# Patient Record
Sex: Female | Born: 1945
Health system: Southern US, Community
[De-identification: ages and names within clinical notes are randomized; demographics above are authoritative.]

## PROBLEM LIST (undated history)

## (undated) DIAGNOSIS — Z860100 Personal history of colon polyps, unspecified: Secondary | ICD-10-CM

## (undated) DIAGNOSIS — I10 Essential (primary) hypertension: Secondary | ICD-10-CM

## (undated) DIAGNOSIS — C50919 Malignant neoplasm of unspecified site of unspecified female breast: Secondary | ICD-10-CM

## (undated) DIAGNOSIS — J439 Emphysema, unspecified: Secondary | ICD-10-CM

## (undated) DIAGNOSIS — R0600 Dyspnea, unspecified: Secondary | ICD-10-CM

## (undated) DIAGNOSIS — H3530 Unspecified macular degeneration: Secondary | ICD-10-CM

## (undated) DIAGNOSIS — E669 Obesity, unspecified: Secondary | ICD-10-CM

## (undated) DIAGNOSIS — Z9221 Personal history of antineoplastic chemotherapy: Secondary | ICD-10-CM

## (undated) DIAGNOSIS — K589 Irritable bowel syndrome without diarrhea: Secondary | ICD-10-CM

## (undated) DIAGNOSIS — J42 Unspecified chronic bronchitis: Secondary | ICD-10-CM

## (undated) DIAGNOSIS — K5790 Diverticulosis of intestine, part unspecified, without perforation or abscess without bleeding: Secondary | ICD-10-CM

## (undated) DIAGNOSIS — I739 Peripheral vascular disease, unspecified: Secondary | ICD-10-CM

## (undated) DIAGNOSIS — C801 Malignant (primary) neoplasm, unspecified: Secondary | ICD-10-CM

## (undated) DIAGNOSIS — K219 Gastro-esophageal reflux disease without esophagitis: Secondary | ICD-10-CM

## (undated) DIAGNOSIS — F329 Major depressive disorder, single episode, unspecified: Secondary | ICD-10-CM

## (undated) DIAGNOSIS — R0789 Other chest pain: Secondary | ICD-10-CM

## (undated) DIAGNOSIS — J309 Allergic rhinitis, unspecified: Secondary | ICD-10-CM

## (undated) DIAGNOSIS — I2510 Atherosclerotic heart disease of native coronary artery without angina pectoris: Secondary | ICD-10-CM

## (undated) DIAGNOSIS — Z923 Personal history of irradiation: Secondary | ICD-10-CM

## (undated) HISTORY — DX: Unspecified macular degeneration: H35.30

## (undated) HISTORY — PX: TUBAL LIGATION: SHX77

## (undated) HISTORY — PX: VAGINAL HYSTERECTOMY: SUR661

## (undated) HISTORY — DX: Other chest pain: R07.89

## (undated) HISTORY — DX: Irritable bowel syndrome, unspecified: K58.9

## (undated) HISTORY — DX: Obesity, unspecified: E66.9

## (undated) HISTORY — DX: Essential (primary) hypertension: I10

## (undated) HISTORY — DX: Personal history of colonic polyps: Z86.010

## (undated) HISTORY — DX: Gastro-esophageal reflux disease without esophagitis: K21.9

## (undated) HISTORY — DX: Allergic rhinitis, unspecified: J30.9

## (undated) HISTORY — DX: Personal history of colon polyps, unspecified: Z86.0100

## (undated) HISTORY — PX: VEIN LIGATION AND STRIPPING: SHX2653

## (undated) HISTORY — DX: Dyspnea, unspecified: R06.00

## (undated) HISTORY — PX: CATARACT EXTRACTION: SUR2

## (undated) HISTORY — DX: Major depressive disorder, single episode, unspecified: F32.9

## (undated) HISTORY — PX: BREAST LUMPECTOMY: SHX2

## (undated) HISTORY — PX: CHOLECYSTECTOMY: SHX55

## (undated) HISTORY — DX: Unspecified chronic bronchitis: J42

## (undated) HISTORY — DX: Peripheral vascular disease, unspecified: I73.9

## (undated) HISTORY — PX: BREAST BIOPSY: SHX20

## (undated) HISTORY — DX: Diverticulosis of intestine, part unspecified, without perforation or abscess without bleeding: K57.90

## (undated) HISTORY — DX: Atherosclerotic heart disease of native coronary artery without angina pectoris: I25.10

## (undated) HISTORY — DX: Emphysema, unspecified: J43.9

---

## 2003-06-13 ENCOUNTER — Encounter (INDEPENDENT_AMBULATORY_CARE_PROVIDER_SITE_OTHER): Payer: Self-pay | Admitting: *Deleted

## 2003-06-13 ENCOUNTER — Encounter: Admission: RE | Admit: 2003-06-13 | Discharge: 2003-06-13 | Payer: Self-pay | Admitting: *Deleted

## 2003-06-15 ENCOUNTER — Encounter (INDEPENDENT_AMBULATORY_CARE_PROVIDER_SITE_OTHER): Payer: Self-pay | Admitting: *Deleted

## 2003-06-15 ENCOUNTER — Encounter: Admission: RE | Admit: 2003-06-15 | Discharge: 2003-06-15 | Payer: Self-pay | Admitting: *Deleted

## 2003-06-25 ENCOUNTER — Encounter (HOSPITAL_COMMUNITY): Admission: RE | Admit: 2003-06-25 | Discharge: 2003-09-23 | Payer: Self-pay | Admitting: *Deleted

## 2003-07-02 ENCOUNTER — Ambulatory Visit (HOSPITAL_COMMUNITY): Admission: RE | Admit: 2003-07-02 | Discharge: 2003-07-02 | Payer: Self-pay | Admitting: *Deleted

## 2003-07-02 ENCOUNTER — Encounter: Admission: RE | Admit: 2003-07-02 | Discharge: 2003-07-02 | Payer: Self-pay | Admitting: *Deleted

## 2003-07-02 ENCOUNTER — Encounter (INDEPENDENT_AMBULATORY_CARE_PROVIDER_SITE_OTHER): Payer: Self-pay | Admitting: *Deleted

## 2003-07-02 ENCOUNTER — Ambulatory Visit (HOSPITAL_BASED_OUTPATIENT_CLINIC_OR_DEPARTMENT_OTHER): Admission: RE | Admit: 2003-07-02 | Discharge: 2003-07-02 | Payer: Self-pay | Admitting: *Deleted

## 2003-07-03 ENCOUNTER — Encounter (INDEPENDENT_AMBULATORY_CARE_PROVIDER_SITE_OTHER): Payer: Self-pay | Admitting: *Deleted

## 2003-07-16 ENCOUNTER — Ambulatory Visit (HOSPITAL_COMMUNITY): Admission: RE | Admit: 2003-07-16 | Discharge: 2003-07-16 | Payer: Self-pay | Admitting: *Deleted

## 2003-07-16 ENCOUNTER — Encounter (INDEPENDENT_AMBULATORY_CARE_PROVIDER_SITE_OTHER): Payer: Self-pay | Admitting: *Deleted

## 2003-07-16 ENCOUNTER — Ambulatory Visit (HOSPITAL_BASED_OUTPATIENT_CLINIC_OR_DEPARTMENT_OTHER): Admission: RE | Admit: 2003-07-16 | Discharge: 2003-07-16 | Payer: Self-pay | Admitting: *Deleted

## 2003-07-17 ENCOUNTER — Ambulatory Visit: Admission: RE | Admit: 2003-07-17 | Discharge: 2003-08-13 | Payer: Self-pay | Admitting: Radiation Oncology

## 2003-08-07 ENCOUNTER — Ambulatory Visit (HOSPITAL_BASED_OUTPATIENT_CLINIC_OR_DEPARTMENT_OTHER): Admission: RE | Admit: 2003-08-07 | Discharge: 2003-08-07 | Payer: Self-pay | Admitting: *Deleted

## 2003-11-02 ENCOUNTER — Ambulatory Visit: Admission: RE | Admit: 2003-11-02 | Discharge: 2004-01-21 | Payer: Self-pay | Admitting: Radiation Oncology

## 2003-11-07 ENCOUNTER — Encounter: Admission: RE | Admit: 2003-11-07 | Discharge: 2003-11-07 | Payer: Self-pay | Admitting: Radiation Oncology

## 2004-01-09 ENCOUNTER — Ambulatory Visit: Payer: Self-pay | Admitting: Oncology

## 2004-02-12 ENCOUNTER — Ambulatory Visit: Admission: RE | Admit: 2004-02-12 | Discharge: 2004-02-12 | Payer: Self-pay | Admitting: Radiation Oncology

## 2004-04-03 ENCOUNTER — Ambulatory Visit: Payer: Self-pay | Admitting: Oncology

## 2004-06-12 ENCOUNTER — Encounter: Admission: RE | Admit: 2004-06-12 | Discharge: 2004-06-12 | Payer: Self-pay | Admitting: Oncology

## 2004-06-19 ENCOUNTER — Ambulatory Visit: Payer: Self-pay | Admitting: Oncology

## 2004-07-23 ENCOUNTER — Ambulatory Visit: Admission: RE | Admit: 2004-07-23 | Discharge: 2004-07-23 | Payer: Self-pay | Admitting: Radiation Oncology

## 2004-08-06 ENCOUNTER — Ambulatory Visit (HOSPITAL_BASED_OUTPATIENT_CLINIC_OR_DEPARTMENT_OTHER): Admission: RE | Admit: 2004-08-06 | Discharge: 2004-08-06 | Payer: Self-pay | Admitting: *Deleted

## 2004-10-16 ENCOUNTER — Ambulatory Visit: Payer: Self-pay | Admitting: Oncology

## 2005-02-18 ENCOUNTER — Ambulatory Visit: Payer: Self-pay | Admitting: Oncology

## 2005-06-15 ENCOUNTER — Encounter: Admission: RE | Admit: 2005-06-15 | Discharge: 2005-06-15 | Payer: Self-pay | Admitting: Oncology

## 2005-06-15 ENCOUNTER — Encounter (INDEPENDENT_AMBULATORY_CARE_PROVIDER_SITE_OTHER): Payer: Self-pay | Admitting: *Deleted

## 2005-06-25 ENCOUNTER — Ambulatory Visit: Payer: Self-pay | Admitting: Oncology

## 2005-10-15 ENCOUNTER — Ambulatory Visit: Payer: Self-pay | Admitting: Oncology

## 2006-02-04 ENCOUNTER — Ambulatory Visit: Payer: Self-pay | Admitting: Oncology

## 2006-06-17 ENCOUNTER — Encounter: Admission: RE | Admit: 2006-06-17 | Discharge: 2006-06-17 | Payer: Self-pay | Admitting: Oncology

## 2006-06-17 ENCOUNTER — Encounter (INDEPENDENT_AMBULATORY_CARE_PROVIDER_SITE_OTHER): Payer: Self-pay | Admitting: Specialist

## 2006-06-23 ENCOUNTER — Ambulatory Visit: Payer: Self-pay | Admitting: Oncology

## 2006-12-03 ENCOUNTER — Ambulatory Visit: Payer: Self-pay | Admitting: Oncology

## 2006-12-17 ENCOUNTER — Encounter: Admission: RE | Admit: 2006-12-17 | Discharge: 2006-12-17 | Payer: Self-pay | Admitting: Oncology

## 2007-02-01 ENCOUNTER — Ambulatory Visit (HOSPITAL_COMMUNITY): Admission: RE | Admit: 2007-02-01 | Discharge: 2007-02-01 | Payer: Self-pay | Admitting: *Deleted

## 2007-02-01 ENCOUNTER — Encounter (INDEPENDENT_AMBULATORY_CARE_PROVIDER_SITE_OTHER): Payer: Self-pay | Admitting: *Deleted

## 2007-06-27 ENCOUNTER — Encounter: Admission: RE | Admit: 2007-06-27 | Discharge: 2007-06-27 | Payer: Self-pay | Admitting: Oncology

## 2008-07-03 ENCOUNTER — Encounter: Admission: RE | Admit: 2008-07-03 | Discharge: 2008-07-03 | Payer: Self-pay | Admitting: Oncology

## 2009-07-09 ENCOUNTER — Encounter: Admission: RE | Admit: 2009-07-09 | Discharge: 2009-07-09 | Payer: Self-pay | Admitting: Oncology

## 2010-03-30 ENCOUNTER — Encounter: Payer: Self-pay | Admitting: Oncology

## 2010-06-17 ENCOUNTER — Other Ambulatory Visit: Payer: Self-pay | Admitting: Family Medicine

## 2010-06-17 DIAGNOSIS — Z9889 Other specified postprocedural states: Secondary | ICD-10-CM

## 2010-07-15 ENCOUNTER — Ambulatory Visit
Admission: RE | Admit: 2010-07-15 | Discharge: 2010-07-15 | Disposition: A | Discharge status: Home / Self Care | Payer: BC Managed Care – PPO | Source: Ambulatory Visit | Attending: Family Medicine | Admitting: Family Medicine

## 2010-07-15 DIAGNOSIS — Z9889 Other specified postprocedural states: Secondary | ICD-10-CM

## 2010-07-22 NOTE — Op Note (Signed)
NAME:  Heather Chavez, Heather Chavez             ACCOUNT NO.:  [id number]   MEDICAL RECORD NO.:  [id number]          PATIENT TYPE:  AMB   LOCATION:  DAY                          FACILITY:  [facility]   PHYSICIAN:  Alfonse Ras, MD   DATE OF BIRTH:  09-28-1945   DATE OF PROCEDURE:  DATE OF DISCHARGE:                               OPERATIVE REPORT   PREOPERATIVE DIAGNOSIS:  History of right-sided breast cancer with right-  sided breast mass.   PROCEDURE:  Excisional right breast biopsy.   SURGEON:  Alfonse Ras, MD.   ANESTHESIA:  Local MAC.   DESCRIPTION OF PROCEDURE:  The patient was taken to the operating room  and placed in a supine position.  After adequate MAC anesthesia was  induced, the right breast was prepped and draped in normal sterile  fashion.  Using 1% lidocaine local anesthesia, the skin around the mass  and the upper outer quadrant was anesthetized.  Deep tissues were also  anesthetized.  Makin the elliptical incision around the mass, I  dissected down onto fatty tissue.  There was some scarring, but was most  consistent grossly with fat necrosis.  The specimen was sent to  pathology.  Adequate hemostasis was ensured.  The skin was closed with  subcuticular 4-0 Monocryl.  Steri-Strips and sterile dressings were  applied.  The patient tolerated the procedure well and went to PACU in  good condition.      Alfonse Ras, MD  Electronically Signed     KRE/MEDQ  D:  02/01/2007  T:  02/02/2007  Job:  [id number]

## 2010-07-25 NOTE — Op Note (Signed)
NAME:  Heather Chavez, Heather Chavez                       ACCOUNT NO.:  [id number]   MEDICAL RECORD NO.:  [id number]                   PATIENT TYPE:  AMB   LOCATION:  DSC                                  FACILITY:  [facility]   PHYSICIAN:  Vikki Ports, M.D.         DATE OF BIRTH:  1945/07/17   DATE OF PROCEDURE:  07/16/2003  DATE OF DISCHARGE:                                 OPERATIVE REPORT   PREOPERATIVE DIAGNOSES:  Invasive right breast cancer, status post partial  mastectomy.   POSTOPERATIVE DIAGNOSES:  Invasive right breast cancer, status post partial  mastectomy.   OPERATION PERFORMED:  Blue dye injection, lymphatic mapping, right axillary  sentinel lymph node biopsy and re-excision of partial mastectomy site.   SURGEON:  Vikki Ports, M.D.   ANESTHESIA:  General.   DESCRIPTION OF PROCEDURE:  The patient was taken to the operating room and  placed in supine position.  After adequate  anesthesia was induced, the  right axilla and breast were prepped and draped in the normal sterile  fashion.  Using 5 mL of Lymphazurin blue dye was injected in the  retroareolar position.  This was massaged in place for five minutes.  An  area of high activity was identified in the right axilla and in incision was  made over this.  Dissecting down to the axillary fat pad this was opened and  a hot blue lymph node was easily seen.  It was excised and sent for  pathologic evaluation.   I then turned my attention to the previous incision site.  With Allis clamps  the inferior portion of the cavity was re-excised using the electrocautery  and marked.  Both incisions were closed with subcuticular 4-0 Monocryl.  Steri-Strips and sterile dressings were applied.  The patient tolerated the  procedure well and went to PACU in good condition.                                               Vikki Ports, M.D.    KRH/MEDQ  D:  07/16/2003  T:  07/17/2003  Job:  [id number]

## 2010-07-25 NOTE — Op Note (Signed)
NAME:  ZANYLA, KLEBBA             ACCOUNT NO.:  [id number]   MEDICAL RECORD NO.:  [id number]          PATIENT TYPE:  AMB   LOCATION:  DSC                          FACILITY:  [facility]   PHYSICIAN:  Vikki Ports, MDDATE OF BIRTH:  [birth year]   DATE OF PROCEDURE:  08/06/2004  DATE OF DISCHARGE:                                 OPERATIVE REPORT   PREOPERATIVE DIAGNOSIS:  Previously placed left subclavian Port-A-Cath and  breast cancer.   POSTOPERATIVE DIAGNOSIS:  Previously placed left subclavian Port-A-Cath and  breast cancer.   PROCEDURE:  Removal of Port-A-Cath.   ANESTHESIA:  Local.   DESCRIPTION:  The patient was taken to the minor room at [facility] day  surgery, and the left chest was prepped and draped in the normal sterile  fashion. One percent lidocaine local anesthesia was used to anesthetize the  skin and subcutaneous tissue around the Port-A-Cath. Incision was made,  dissected down, opened the capsule around the port, removed the Prolene  sutures, and removed the port. Adequate hemostasis was ensured, and the skin  was closed with subcuticular 4-0 Monocryl. Steri-Strips and sterile  dressings were applied. The patient tolerated the procedure well and went to  PACU in good condition.       KRH/MEDQ  D:  08/06/2004  T:  08/06/2004  Job:  [id number]

## 2010-07-25 NOTE — Op Note (Signed)
NAME:  Heather Chavez, Heather Chavez                       ACCOUNT NO.:  [id number]   MEDICAL RECORD NO.:  [id number]                   PATIENT TYPE:  AMB   LOCATION:  NESC                                 FACILITY:  [facility]   PHYSICIAN:  Vikki Ports, M.D.         DATE OF BIRTH:  11-Jun-1945   DATE OF PROCEDURE:  08/07/2003  DATE OF DISCHARGE:                                 OPERATIVE REPORT   PREOPERATIVE DIAGNOSIS:  Invasive right breast cancer.   POSTOPERATIVE DIAGNOSIS:  Invasive right breast cancer.   PROCEDURE:  Placement of left subclavian Port-A-Cath.   SURGEON:  Vikki Ports, M.D.   ANESTHESIA:  MAC.   DESCRIPTION OF PROCEDURE:  The patient was taken to the operating room and  placed in the supine position.  After adequate MAC anesthesia was induced,  the left chest was prepped and draped in a normal sterile fashion.  Lidocaine 1% in the skin underlying the left clavicle and the periosteum of  the left clavicle were anesthetized.  The left subclavian venipuncture was  performed, and a guide wire was placed.  This was verified to be in the  right ventricle.  Through a separate area on the left chest, the skin was  anesthetized.  A transverse incision was made and a port was placed within  it.  It was tunneled and brought out next to the guide wire.  A dilator and  sheath were then placed over the guide wire.  The dilator and wire were  removed.  The Port-A-Cath was placed down through the sheath, and the sheath  was removed.  It aspirated and flushed easily.  It was flushed with 5 mL of  concentrated 100 units per mL of heparin, and was tacked to the pectoralis  fascia with interrupted #2-0 Prolene.  The skin was closed in two layers  with a subcutaneous #3-0 Vicryl, and the subcuticular #4-0 Monocryl.  Steri-  Strips and sterile dressings were applied.  The patient tolerated the procedure well.  She will undergo a chest x-ray in  the PACU.                          Vikki Ports, M.D.    KRH/MEDQ  D:  08/07/2003  T:  08/07/2003  Job:  [id number]

## 2010-07-25 NOTE — Op Note (Signed)
NAME:  Heather Chavez, Heather Chavez                       ACCOUNT NO.:  [id number]   MEDICAL RECORD NO.:  [id number]                   PATIENT TYPE:  AMB   LOCATION:  DSC                                  FACILITY:  [facility]   PHYSICIAN:  Vikki Ports, M.D.         DATE OF BIRTH:  [birth year]   DATE OF PROCEDURE:  07/02/2003  DATE OF DISCHARGE:                                 OPERATIVE REPORT   PREOPERATIVE DIAGNOSIS:  DCIS of the right breast.   POSTOPERATIVE DIAGNOSIS:  DCIS of the right breast.   PROCEDURE:  Needle localization and right partial mastectomy.   SURGEON:  Vikki Ports, M.D.   ANESTHESIA:  General.   DESCRIPTION OF PROCEDURE:  The patient was taken to the operating room and  placed in the supine position after adequate general anesthesia was induced  using endotracheal tube. The right breast was prepped and draped in the  usual sterile fashion.  Using curvilinear incision extending from about 10  o'clock to about 1 o'clock of the right breast, I dissected down excising  breast tissue and fibrotic tissue surrounding the reinforced segment of the  previously placed guide wire.  It appeared that my posterior medial margin  was close to the reinforced segment and therefore an additional 1 cm of  tissue was taken and sent for separate evaluation.  Adequate hemostasis of  the cavity was ensured.  Specimen mammography verified the presence of the  calcification.  The skin was closed with subcuticular 4-0 Monocryl, Steri-  Strips, sterile dressings were applied. The patient tolerated the procedure  well and went to PACU in good condition.                                               Vikki Ports, M.D.    KRH/MEDQ  D:  07/02/2003  T:  07/02/2003  Job:  [id number]

## 2010-12-16 LAB — BASIC METABOLIC PANEL
BUN: 13
Calcium: 9.6
Creatinine, Ser: 0.61
GFR calc Af Amer: >60
GFR calc non Af Amer: >60

## 2010-12-16 LAB — HEMOGLOBIN AND HEMATOCRIT, BLOOD
HCT: 38
Hemoglobin: 13.2

## 2011-05-27 ENCOUNTER — Other Ambulatory Visit: Payer: Self-pay | Admitting: Family Medicine

## 2011-05-27 DIAGNOSIS — Z853 Personal history of malignant neoplasm of breast: Secondary | ICD-10-CM

## 2011-07-20 ENCOUNTER — Ambulatory Visit
Admission: RE | Admit: 2011-07-20 | Discharge: 2011-07-20 | Disposition: A | Discharge status: Home / Self Care | Payer: BC Managed Care – PPO | Source: Ambulatory Visit | Attending: Family Medicine | Admitting: Family Medicine

## 2011-07-20 DIAGNOSIS — Z853 Personal history of malignant neoplasm of breast: Secondary | ICD-10-CM

## 2011-11-26 DIAGNOSIS — C44519 Basal cell carcinoma of skin of other part of trunk: Secondary | ICD-10-CM | POA: Diagnosis not present

## 2011-11-26 DIAGNOSIS — C44711 Basal cell carcinoma of skin of unspecified lower limb, including hip: Secondary | ICD-10-CM | POA: Diagnosis not present

## 2011-11-26 DIAGNOSIS — L570 Actinic keratosis: Secondary | ICD-10-CM | POA: Diagnosis not present

## 2011-12-15 DIAGNOSIS — C44711 Basal cell carcinoma of skin of unspecified lower limb, including hip: Secondary | ICD-10-CM | POA: Diagnosis not present

## 2012-01-26 DIAGNOSIS — L570 Actinic keratosis: Secondary | ICD-10-CM | POA: Diagnosis not present

## 2012-02-09 DIAGNOSIS — J40 Bronchitis, not specified as acute or chronic: Secondary | ICD-10-CM | POA: Diagnosis not present

## 2012-02-11 DIAGNOSIS — M25519 Pain in unspecified shoulder: Secondary | ICD-10-CM | POA: Diagnosis not present

## 2012-02-11 DIAGNOSIS — M9981 Other biomechanical lesions of cervical region: Secondary | ICD-10-CM | POA: Diagnosis not present

## 2012-02-11 DIAGNOSIS — M5412 Radiculopathy, cervical region: Secondary | ICD-10-CM | POA: Diagnosis not present

## 2012-02-11 DIAGNOSIS — M999 Biomechanical lesion, unspecified: Secondary | ICD-10-CM | POA: Diagnosis not present

## 2012-02-16 DIAGNOSIS — M9981 Other biomechanical lesions of cervical region: Secondary | ICD-10-CM | POA: Diagnosis not present

## 2012-02-16 DIAGNOSIS — M999 Biomechanical lesion, unspecified: Secondary | ICD-10-CM | POA: Diagnosis not present

## 2012-02-16 DIAGNOSIS — M25519 Pain in unspecified shoulder: Secondary | ICD-10-CM | POA: Diagnosis not present

## 2012-02-16 DIAGNOSIS — M5412 Radiculopathy, cervical region: Secondary | ICD-10-CM | POA: Diagnosis not present

## 2012-02-23 DIAGNOSIS — M9981 Other biomechanical lesions of cervical region: Secondary | ICD-10-CM | POA: Diagnosis not present

## 2012-02-23 DIAGNOSIS — M5412 Radiculopathy, cervical region: Secondary | ICD-10-CM | POA: Diagnosis not present

## 2012-02-23 DIAGNOSIS — M999 Biomechanical lesion, unspecified: Secondary | ICD-10-CM | POA: Diagnosis not present

## 2012-02-23 DIAGNOSIS — M25519 Pain in unspecified shoulder: Secondary | ICD-10-CM | POA: Diagnosis not present

## 2012-03-24 DIAGNOSIS — M999 Biomechanical lesion, unspecified: Secondary | ICD-10-CM | POA: Diagnosis not present

## 2012-03-24 DIAGNOSIS — IMO0002 Reserved for concepts with insufficient information to code with codable children: Secondary | ICD-10-CM | POA: Diagnosis not present

## 2012-03-25 DIAGNOSIS — Z23 Encounter for immunization: Secondary | ICD-10-CM | POA: Diagnosis not present

## 2012-03-30 DIAGNOSIS — I10 Essential (primary) hypertension: Secondary | ICD-10-CM | POA: Diagnosis not present

## 2012-03-30 DIAGNOSIS — Z79899 Other long term (current) drug therapy: Secondary | ICD-10-CM | POA: Diagnosis not present

## 2012-03-30 DIAGNOSIS — J309 Allergic rhinitis, unspecified: Secondary | ICD-10-CM | POA: Diagnosis not present

## 2012-03-30 DIAGNOSIS — K219 Gastro-esophageal reflux disease without esophagitis: Secondary | ICD-10-CM | POA: Diagnosis not present

## 2012-04-12 DIAGNOSIS — IMO0002 Reserved for concepts with insufficient information to code with codable children: Secondary | ICD-10-CM | POA: Diagnosis not present

## 2012-04-12 DIAGNOSIS — M999 Biomechanical lesion, unspecified: Secondary | ICD-10-CM | POA: Diagnosis not present

## 2012-04-14 DIAGNOSIS — M999 Biomechanical lesion, unspecified: Secondary | ICD-10-CM | POA: Diagnosis not present

## 2012-04-14 DIAGNOSIS — IMO0002 Reserved for concepts with insufficient information to code with codable children: Secondary | ICD-10-CM | POA: Diagnosis not present

## 2012-04-26 DIAGNOSIS — H353190 Nonexudative age-related macular degeneration, unspecified eye, stage unspecified: Secondary | ICD-10-CM | POA: Diagnosis not present

## 2012-05-06 DIAGNOSIS — R1013 Epigastric pain: Secondary | ICD-10-CM | POA: Diagnosis not present

## 2012-05-06 DIAGNOSIS — K219 Gastro-esophageal reflux disease without esophagitis: Secondary | ICD-10-CM | POA: Diagnosis not present

## 2012-05-19 DIAGNOSIS — M999 Biomechanical lesion, unspecified: Secondary | ICD-10-CM | POA: Diagnosis not present

## 2012-05-19 DIAGNOSIS — IMO0002 Reserved for concepts with insufficient information to code with codable children: Secondary | ICD-10-CM | POA: Diagnosis not present

## 2012-05-27 DIAGNOSIS — R0609 Other forms of dyspnea: Secondary | ICD-10-CM | POA: Diagnosis not present

## 2012-06-08 DIAGNOSIS — Z79899 Other long term (current) drug therapy: Secondary | ICD-10-CM | POA: Diagnosis not present

## 2012-06-08 DIAGNOSIS — J309 Allergic rhinitis, unspecified: Secondary | ICD-10-CM | POA: Diagnosis not present

## 2012-06-08 DIAGNOSIS — H3530 Unspecified macular degeneration: Secondary | ICD-10-CM | POA: Diagnosis not present

## 2012-06-08 DIAGNOSIS — I10 Essential (primary) hypertension: Secondary | ICD-10-CM | POA: Diagnosis not present

## 2012-06-08 DIAGNOSIS — D131 Benign neoplasm of stomach: Secondary | ICD-10-CM | POA: Diagnosis not present

## 2012-06-08 DIAGNOSIS — K449 Diaphragmatic hernia without obstruction or gangrene: Secondary | ICD-10-CM | POA: Diagnosis not present

## 2012-06-08 DIAGNOSIS — K219 Gastro-esophageal reflux disease without esophagitis: Secondary | ICD-10-CM | POA: Diagnosis not present

## 2012-06-08 DIAGNOSIS — R1013 Epigastric pain: Secondary | ICD-10-CM | POA: Diagnosis not present

## 2012-06-14 DIAGNOSIS — K8020 Calculus of gallbladder without cholecystitis without obstruction: Secondary | ICD-10-CM | POA: Diagnosis not present

## 2012-06-14 DIAGNOSIS — R1013 Epigastric pain: Secondary | ICD-10-CM | POA: Diagnosis not present

## 2012-06-20 DIAGNOSIS — K8010 Calculus of gallbladder with chronic cholecystitis without obstruction: Secondary | ICD-10-CM | POA: Diagnosis not present

## 2012-06-20 DIAGNOSIS — E669 Obesity, unspecified: Secondary | ICD-10-CM | POA: Diagnosis not present

## 2012-06-29 ENCOUNTER — Other Ambulatory Visit: Payer: Self-pay

## 2012-06-29 DIAGNOSIS — Z1231 Encounter for screening mammogram for malignant neoplasm of breast: Secondary | ICD-10-CM

## 2012-06-30 DIAGNOSIS — IMO0002 Reserved for concepts with insufficient information to code with codable children: Secondary | ICD-10-CM | POA: Diagnosis not present

## 2012-06-30 DIAGNOSIS — M999 Biomechanical lesion, unspecified: Secondary | ICD-10-CM | POA: Diagnosis not present

## 2012-07-14 DIAGNOSIS — IMO0002 Reserved for concepts with insufficient information to code with codable children: Secondary | ICD-10-CM | POA: Diagnosis not present

## 2012-07-14 DIAGNOSIS — M999 Biomechanical lesion, unspecified: Secondary | ICD-10-CM | POA: Diagnosis not present

## 2012-07-19 ENCOUNTER — Ambulatory Visit
Admission: RE | Admit: 2012-07-19 | Discharge: 2012-07-19 | Disposition: A | Discharge status: Home / Self Care | Payer: BC Managed Care – PPO | Source: Ambulatory Visit

## 2012-07-19 DIAGNOSIS — Z1231 Encounter for screening mammogram for malignant neoplasm of breast: Secondary | ICD-10-CM

## 2012-08-11 DIAGNOSIS — IMO0002 Reserved for concepts with insufficient information to code with codable children: Secondary | ICD-10-CM | POA: Diagnosis not present

## 2012-08-11 DIAGNOSIS — M999 Biomechanical lesion, unspecified: Secondary | ICD-10-CM | POA: Diagnosis not present

## 2012-08-18 DIAGNOSIS — M999 Biomechanical lesion, unspecified: Secondary | ICD-10-CM | POA: Diagnosis not present

## 2012-08-18 DIAGNOSIS — IMO0002 Reserved for concepts with insufficient information to code with codable children: Secondary | ICD-10-CM | POA: Diagnosis not present

## 2012-08-25 DIAGNOSIS — IMO0002 Reserved for concepts with insufficient information to code with codable children: Secondary | ICD-10-CM | POA: Diagnosis not present

## 2012-08-25 DIAGNOSIS — M999 Biomechanical lesion, unspecified: Secondary | ICD-10-CM | POA: Diagnosis not present

## 2012-09-01 DIAGNOSIS — IMO0002 Reserved for concepts with insufficient information to code with codable children: Secondary | ICD-10-CM | POA: Diagnosis not present

## 2012-09-01 DIAGNOSIS — M999 Biomechanical lesion, unspecified: Secondary | ICD-10-CM | POA: Diagnosis not present

## 2012-09-13 DIAGNOSIS — H18519 Endothelial corneal dystrophy, unspecified eye: Secondary | ICD-10-CM | POA: Diagnosis not present

## 2012-09-13 DIAGNOSIS — H3530 Unspecified macular degeneration: Secondary | ICD-10-CM | POA: Diagnosis not present

## 2012-09-20 DIAGNOSIS — H3530 Unspecified macular degeneration: Secondary | ICD-10-CM | POA: Diagnosis not present

## 2012-09-20 DIAGNOSIS — H353190 Nonexudative age-related macular degeneration, unspecified eye, stage unspecified: Secondary | ICD-10-CM | POA: Diagnosis not present

## 2012-09-20 DIAGNOSIS — H2510 Age-related nuclear cataract, unspecified eye: Secondary | ICD-10-CM | POA: Diagnosis not present

## 2012-09-26 DIAGNOSIS — R079 Chest pain, unspecified: Secondary | ICD-10-CM | POA: Diagnosis not present

## 2012-09-26 DIAGNOSIS — R1011 Right upper quadrant pain: Secondary | ICD-10-CM | POA: Diagnosis not present

## 2012-09-26 DIAGNOSIS — K8000 Calculus of gallbladder with acute cholecystitis without obstruction: Secondary | ICD-10-CM | POA: Diagnosis not present

## 2012-09-26 DIAGNOSIS — Z853 Personal history of malignant neoplasm of breast: Secondary | ICD-10-CM | POA: Diagnosis not present

## 2012-09-26 DIAGNOSIS — I10 Essential (primary) hypertension: Secondary | ICD-10-CM | POA: Diagnosis not present

## 2012-09-26 DIAGNOSIS — K8010 Calculus of gallbladder with chronic cholecystitis without obstruction: Secondary | ICD-10-CM | POA: Diagnosis not present

## 2012-09-26 DIAGNOSIS — K810 Acute cholecystitis: Secondary | ICD-10-CM | POA: Diagnosis not present

## 2012-09-26 DIAGNOSIS — Z23 Encounter for immunization: Secondary | ICD-10-CM | POA: Diagnosis not present

## 2012-09-26 DIAGNOSIS — F329 Major depressive disorder, single episode, unspecified: Secondary | ICD-10-CM | POA: Diagnosis not present

## 2012-09-26 DIAGNOSIS — Z79899 Other long term (current) drug therapy: Secondary | ICD-10-CM | POA: Diagnosis not present

## 2012-10-03 DIAGNOSIS — D2370 Other benign neoplasm of skin of unspecified lower limb, including hip: Secondary | ICD-10-CM | POA: Diagnosis not present

## 2012-10-03 DIAGNOSIS — C44711 Basal cell carcinoma of skin of unspecified lower limb, including hip: Secondary | ICD-10-CM | POA: Diagnosis not present

## 2012-10-10 DIAGNOSIS — C44721 Squamous cell carcinoma of skin of unspecified lower limb, including hip: Secondary | ICD-10-CM | POA: Diagnosis not present

## 2012-10-25 DIAGNOSIS — M999 Biomechanical lesion, unspecified: Secondary | ICD-10-CM | POA: Diagnosis not present

## 2012-10-25 DIAGNOSIS — IMO0002 Reserved for concepts with insufficient information to code with codable children: Secondary | ICD-10-CM | POA: Diagnosis not present

## 2012-10-27 DIAGNOSIS — M999 Biomechanical lesion, unspecified: Secondary | ICD-10-CM | POA: Diagnosis not present

## 2012-10-27 DIAGNOSIS — IMO0002 Reserved for concepts with insufficient information to code with codable children: Secondary | ICD-10-CM | POA: Diagnosis not present

## 2012-11-15 DIAGNOSIS — IMO0002 Reserved for concepts with insufficient information to code with codable children: Secondary | ICD-10-CM | POA: Diagnosis not present

## 2012-11-15 DIAGNOSIS — M999 Biomechanical lesion, unspecified: Secondary | ICD-10-CM | POA: Diagnosis not present

## 2012-11-21 DIAGNOSIS — Z131 Encounter for screening for diabetes mellitus: Secondary | ICD-10-CM | POA: Diagnosis not present

## 2012-11-21 DIAGNOSIS — R609 Edema, unspecified: Secondary | ICD-10-CM | POA: Diagnosis not present

## 2012-11-22 DIAGNOSIS — IMO0002 Reserved for concepts with insufficient information to code with codable children: Secondary | ICD-10-CM | POA: Diagnosis not present

## 2012-11-22 DIAGNOSIS — M999 Biomechanical lesion, unspecified: Secondary | ICD-10-CM | POA: Diagnosis not present

## 2012-11-29 DIAGNOSIS — M999 Biomechanical lesion, unspecified: Secondary | ICD-10-CM | POA: Diagnosis not present

## 2012-11-29 DIAGNOSIS — IMO0002 Reserved for concepts with insufficient information to code with codable children: Secondary | ICD-10-CM | POA: Diagnosis not present

## 2012-12-01 DIAGNOSIS — M999 Biomechanical lesion, unspecified: Secondary | ICD-10-CM | POA: Diagnosis not present

## 2012-12-01 DIAGNOSIS — IMO0002 Reserved for concepts with insufficient information to code with codable children: Secondary | ICD-10-CM | POA: Diagnosis not present

## 2012-12-06 DIAGNOSIS — M999 Biomechanical lesion, unspecified: Secondary | ICD-10-CM | POA: Diagnosis not present

## 2012-12-06 DIAGNOSIS — IMO0002 Reserved for concepts with insufficient information to code with codable children: Secondary | ICD-10-CM | POA: Diagnosis not present

## 2012-12-15 DIAGNOSIS — M999 Biomechanical lesion, unspecified: Secondary | ICD-10-CM | POA: Diagnosis not present

## 2012-12-15 DIAGNOSIS — IMO0002 Reserved for concepts with insufficient information to code with codable children: Secondary | ICD-10-CM | POA: Diagnosis not present

## 2012-12-20 DIAGNOSIS — IMO0002 Reserved for concepts with insufficient information to code with codable children: Secondary | ICD-10-CM | POA: Diagnosis not present

## 2012-12-20 DIAGNOSIS — M999 Biomechanical lesion, unspecified: Secondary | ICD-10-CM | POA: Diagnosis not present

## 2012-12-27 DIAGNOSIS — M999 Biomechanical lesion, unspecified: Secondary | ICD-10-CM | POA: Diagnosis not present

## 2012-12-27 DIAGNOSIS — IMO0002 Reserved for concepts with insufficient information to code with codable children: Secondary | ICD-10-CM | POA: Diagnosis not present

## 2013-01-03 DIAGNOSIS — M999 Biomechanical lesion, unspecified: Secondary | ICD-10-CM | POA: Diagnosis not present

## 2013-01-03 DIAGNOSIS — IMO0002 Reserved for concepts with insufficient information to code with codable children: Secondary | ICD-10-CM | POA: Diagnosis not present

## 2013-01-26 DIAGNOSIS — IMO0002 Reserved for concepts with insufficient information to code with codable children: Secondary | ICD-10-CM | POA: Diagnosis not present

## 2013-01-26 DIAGNOSIS — M999 Biomechanical lesion, unspecified: Secondary | ICD-10-CM | POA: Diagnosis not present

## 2013-01-30 DIAGNOSIS — J329 Chronic sinusitis, unspecified: Secondary | ICD-10-CM | POA: Diagnosis not present

## 2013-01-31 DIAGNOSIS — M999 Biomechanical lesion, unspecified: Secondary | ICD-10-CM | POA: Diagnosis not present

## 2013-01-31 DIAGNOSIS — IMO0002 Reserved for concepts with insufficient information to code with codable children: Secondary | ICD-10-CM | POA: Diagnosis not present

## 2013-02-06 DIAGNOSIS — L821 Other seborrheic keratosis: Secondary | ICD-10-CM | POA: Diagnosis not present

## 2013-02-06 DIAGNOSIS — L570 Actinic keratosis: Secondary | ICD-10-CM | POA: Diagnosis not present

## 2013-02-07 DIAGNOSIS — IMO0002 Reserved for concepts with insufficient information to code with codable children: Secondary | ICD-10-CM | POA: Diagnosis not present

## 2013-02-07 DIAGNOSIS — M999 Biomechanical lesion, unspecified: Secondary | ICD-10-CM | POA: Diagnosis not present

## 2013-02-14 DIAGNOSIS — IMO0002 Reserved for concepts with insufficient information to code with codable children: Secondary | ICD-10-CM | POA: Diagnosis not present

## 2013-02-14 DIAGNOSIS — M999 Biomechanical lesion, unspecified: Secondary | ICD-10-CM | POA: Diagnosis not present

## 2013-02-21 DIAGNOSIS — IMO0002 Reserved for concepts with insufficient information to code with codable children: Secondary | ICD-10-CM | POA: Diagnosis not present

## 2013-02-21 DIAGNOSIS — M999 Biomechanical lesion, unspecified: Secondary | ICD-10-CM | POA: Diagnosis not present

## 2013-02-28 DIAGNOSIS — IMO0002 Reserved for concepts with insufficient information to code with codable children: Secondary | ICD-10-CM | POA: Diagnosis not present

## 2013-02-28 DIAGNOSIS — M999 Biomechanical lesion, unspecified: Secondary | ICD-10-CM | POA: Diagnosis not present

## 2013-03-16 DIAGNOSIS — IMO0002 Reserved for concepts with insufficient information to code with codable children: Secondary | ICD-10-CM | POA: Diagnosis not present

## 2013-03-16 DIAGNOSIS — M999 Biomechanical lesion, unspecified: Secondary | ICD-10-CM | POA: Diagnosis not present

## 2013-03-28 DIAGNOSIS — IMO0002 Reserved for concepts with insufficient information to code with codable children: Secondary | ICD-10-CM | POA: Diagnosis not present

## 2013-03-28 DIAGNOSIS — M999 Biomechanical lesion, unspecified: Secondary | ICD-10-CM | POA: Diagnosis not present

## 2013-03-30 DIAGNOSIS — IMO0002 Reserved for concepts with insufficient information to code with codable children: Secondary | ICD-10-CM | POA: Diagnosis not present

## 2013-03-30 DIAGNOSIS — M999 Biomechanical lesion, unspecified: Secondary | ICD-10-CM | POA: Diagnosis not present

## 2013-04-04 DIAGNOSIS — IMO0002 Reserved for concepts with insufficient information to code with codable children: Secondary | ICD-10-CM | POA: Diagnosis not present

## 2013-04-04 DIAGNOSIS — M999 Biomechanical lesion, unspecified: Secondary | ICD-10-CM | POA: Diagnosis not present

## 2013-04-27 DIAGNOSIS — IMO0002 Reserved for concepts with insufficient information to code with codable children: Secondary | ICD-10-CM | POA: Diagnosis not present

## 2013-04-27 DIAGNOSIS — M999 Biomechanical lesion, unspecified: Secondary | ICD-10-CM | POA: Diagnosis not present

## 2013-05-03 DIAGNOSIS — M999 Biomechanical lesion, unspecified: Secondary | ICD-10-CM | POA: Diagnosis not present

## 2013-05-03 DIAGNOSIS — IMO0002 Reserved for concepts with insufficient information to code with codable children: Secondary | ICD-10-CM | POA: Diagnosis not present

## 2013-05-09 DIAGNOSIS — M999 Biomechanical lesion, unspecified: Secondary | ICD-10-CM | POA: Diagnosis not present

## 2013-05-09 DIAGNOSIS — IMO0002 Reserved for concepts with insufficient information to code with codable children: Secondary | ICD-10-CM | POA: Diagnosis not present

## 2013-05-16 DIAGNOSIS — IMO0002 Reserved for concepts with insufficient information to code with codable children: Secondary | ICD-10-CM | POA: Diagnosis not present

## 2013-05-16 DIAGNOSIS — M999 Biomechanical lesion, unspecified: Secondary | ICD-10-CM | POA: Diagnosis not present

## 2013-05-23 DIAGNOSIS — M999 Biomechanical lesion, unspecified: Secondary | ICD-10-CM | POA: Diagnosis not present

## 2013-05-23 DIAGNOSIS — IMO0002 Reserved for concepts with insufficient information to code with codable children: Secondary | ICD-10-CM | POA: Diagnosis not present

## 2013-05-30 DIAGNOSIS — M999 Biomechanical lesion, unspecified: Secondary | ICD-10-CM | POA: Diagnosis not present

## 2013-05-30 DIAGNOSIS — IMO0002 Reserved for concepts with insufficient information to code with codable children: Secondary | ICD-10-CM | POA: Diagnosis not present

## 2013-06-06 DIAGNOSIS — IMO0002 Reserved for concepts with insufficient information to code with codable children: Secondary | ICD-10-CM | POA: Diagnosis not present

## 2013-06-06 DIAGNOSIS — M999 Biomechanical lesion, unspecified: Secondary | ICD-10-CM | POA: Diagnosis not present

## 2013-06-16 ENCOUNTER — Other Ambulatory Visit: Payer: Self-pay

## 2013-06-16 DIAGNOSIS — Z1231 Encounter for screening mammogram for malignant neoplasm of breast: Secondary | ICD-10-CM

## 2013-06-20 DIAGNOSIS — M999 Biomechanical lesion, unspecified: Secondary | ICD-10-CM | POA: Diagnosis not present

## 2013-06-20 DIAGNOSIS — IMO0002 Reserved for concepts with insufficient information to code with codable children: Secondary | ICD-10-CM | POA: Diagnosis not present

## 2013-07-04 DIAGNOSIS — M999 Biomechanical lesion, unspecified: Secondary | ICD-10-CM | POA: Diagnosis not present

## 2013-07-04 DIAGNOSIS — IMO0002 Reserved for concepts with insufficient information to code with codable children: Secondary | ICD-10-CM | POA: Diagnosis not present

## 2013-07-06 DIAGNOSIS — Z780 Asymptomatic menopausal state: Secondary | ICD-10-CM | POA: Diagnosis not present

## 2013-07-06 DIAGNOSIS — E559 Vitamin D deficiency, unspecified: Secondary | ICD-10-CM | POA: Diagnosis not present

## 2013-07-06 DIAGNOSIS — Z0000 Encounter for general adult medical examination without abnormal findings: Secondary | ICD-10-CM | POA: Diagnosis not present

## 2013-07-06 DIAGNOSIS — M899 Disorder of bone, unspecified: Secondary | ICD-10-CM | POA: Diagnosis not present

## 2013-07-06 DIAGNOSIS — M949 Disorder of cartilage, unspecified: Secondary | ICD-10-CM | POA: Diagnosis not present

## 2013-07-06 DIAGNOSIS — K219 Gastro-esophageal reflux disease without esophagitis: Secondary | ICD-10-CM | POA: Diagnosis not present

## 2013-07-06 DIAGNOSIS — I10 Essential (primary) hypertension: Secondary | ICD-10-CM | POA: Diagnosis not present

## 2013-07-06 DIAGNOSIS — J45909 Unspecified asthma, uncomplicated: Secondary | ICD-10-CM | POA: Diagnosis not present

## 2013-07-13 DIAGNOSIS — M999 Biomechanical lesion, unspecified: Secondary | ICD-10-CM | POA: Diagnosis not present

## 2013-07-13 DIAGNOSIS — IMO0002 Reserved for concepts with insufficient information to code with codable children: Secondary | ICD-10-CM | POA: Diagnosis not present

## 2013-07-18 DIAGNOSIS — M999 Biomechanical lesion, unspecified: Secondary | ICD-10-CM | POA: Diagnosis not present

## 2013-07-18 DIAGNOSIS — IMO0002 Reserved for concepts with insufficient information to code with codable children: Secondary | ICD-10-CM | POA: Diagnosis not present

## 2013-07-25 ENCOUNTER — Ambulatory Visit
Admission: RE | Admit: 2013-07-25 | Discharge: 2013-07-25 | Disposition: A | Discharge status: Home / Self Care | Payer: Medicare Other | Source: Ambulatory Visit

## 2013-07-25 ENCOUNTER — Encounter (INDEPENDENT_AMBULATORY_CARE_PROVIDER_SITE_OTHER): Payer: Self-pay

## 2013-07-25 DIAGNOSIS — Z1231 Encounter for screening mammogram for malignant neoplasm of breast: Secondary | ICD-10-CM

## 2013-07-25 DIAGNOSIS — M999 Biomechanical lesion, unspecified: Secondary | ICD-10-CM | POA: Diagnosis not present

## 2013-07-25 DIAGNOSIS — IMO0002 Reserved for concepts with insufficient information to code with codable children: Secondary | ICD-10-CM | POA: Diagnosis not present

## 2013-07-26 ENCOUNTER — Other Ambulatory Visit: Payer: Self-pay | Admitting: Family Medicine

## 2013-07-26 DIAGNOSIS — R928 Other abnormal and inconclusive findings on diagnostic imaging of breast: Secondary | ICD-10-CM

## 2013-07-28 DIAGNOSIS — R609 Edema, unspecified: Secondary | ICD-10-CM | POA: Diagnosis not present

## 2013-08-01 DIAGNOSIS — IMO0002 Reserved for concepts with insufficient information to code with codable children: Secondary | ICD-10-CM | POA: Diagnosis not present

## 2013-08-01 DIAGNOSIS — M999 Biomechanical lesion, unspecified: Secondary | ICD-10-CM | POA: Diagnosis not present

## 2013-08-03 ENCOUNTER — Ambulatory Visit
Admission: RE | Admit: 2013-08-03 | Discharge: 2013-08-03 | Disposition: A | Discharge status: Home / Self Care | Payer: Medicare Other | Source: Ambulatory Visit | Attending: Family Medicine | Admitting: Family Medicine

## 2013-08-03 ENCOUNTER — Other Ambulatory Visit: Payer: Self-pay | Admitting: Family Medicine

## 2013-08-03 DIAGNOSIS — R928 Other abnormal and inconclusive findings on diagnostic imaging of breast: Secondary | ICD-10-CM

## 2013-08-03 DIAGNOSIS — Z853 Personal history of malignant neoplasm of breast: Secondary | ICD-10-CM | POA: Diagnosis not present

## 2013-08-08 ENCOUNTER — Other Ambulatory Visit: Payer: Medicare Other

## 2013-08-10 ENCOUNTER — Ambulatory Visit
Admission: RE | Admit: 2013-08-10 | Discharge: 2013-08-10 | Disposition: A | Discharge status: Home / Self Care | Payer: Medicare Other | Source: Ambulatory Visit | Attending: Family Medicine | Admitting: Family Medicine

## 2013-08-10 ENCOUNTER — Other Ambulatory Visit: Payer: Self-pay | Admitting: Family Medicine

## 2013-08-10 DIAGNOSIS — D0590 Unspecified type of carcinoma in situ of unspecified breast: Secondary | ICD-10-CM | POA: Diagnosis not present

## 2013-08-10 DIAGNOSIS — Z853 Personal history of malignant neoplasm of breast: Secondary | ICD-10-CM | POA: Diagnosis not present

## 2013-08-10 DIAGNOSIS — C50919 Malignant neoplasm of unspecified site of unspecified female breast: Secondary | ICD-10-CM | POA: Diagnosis not present

## 2013-08-10 DIAGNOSIS — N6320 Unspecified lump in the left breast, unspecified quadrant: Secondary | ICD-10-CM

## 2013-08-10 DIAGNOSIS — IMO0002 Reserved for concepts with insufficient information to code with codable children: Secondary | ICD-10-CM | POA: Diagnosis not present

## 2013-08-10 DIAGNOSIS — N6489 Other specified disorders of breast: Secondary | ICD-10-CM | POA: Diagnosis not present

## 2013-08-10 DIAGNOSIS — R928 Other abnormal and inconclusive findings on diagnostic imaging of breast: Secondary | ICD-10-CM

## 2013-08-10 DIAGNOSIS — M999 Biomechanical lesion, unspecified: Secondary | ICD-10-CM | POA: Diagnosis not present

## 2013-08-10 DIAGNOSIS — N63 Unspecified lump in unspecified breast: Secondary | ICD-10-CM | POA: Diagnosis not present

## 2013-08-11 ENCOUNTER — Other Ambulatory Visit: Payer: Self-pay | Admitting: Family Medicine

## 2013-08-11 DIAGNOSIS — C50919 Malignant neoplasm of unspecified site of unspecified female breast: Secondary | ICD-10-CM

## 2013-08-15 DIAGNOSIS — IMO0002 Reserved for concepts with insufficient information to code with codable children: Secondary | ICD-10-CM | POA: Diagnosis not present

## 2013-08-15 DIAGNOSIS — M999 Biomechanical lesion, unspecified: Secondary | ICD-10-CM | POA: Diagnosis not present

## 2013-08-22 ENCOUNTER — Ambulatory Visit
Admission: RE | Admit: 2013-08-22 | Discharge: 2013-08-22 | Disposition: A | Discharge status: Home / Self Care | Payer: Medicare Other | Source: Ambulatory Visit | Attending: Family Medicine | Admitting: Family Medicine

## 2013-08-22 DIAGNOSIS — C50919 Malignant neoplasm of unspecified site of unspecified female breast: Secondary | ICD-10-CM | POA: Diagnosis not present

## 2013-08-22 DIAGNOSIS — Z6837 Body mass index (BMI) 37.0-37.9, adult: Secondary | ICD-10-CM | POA: Diagnosis not present

## 2013-08-22 DIAGNOSIS — E669 Obesity, unspecified: Secondary | ICD-10-CM | POA: Diagnosis not present

## 2013-08-22 DIAGNOSIS — C50119 Malignant neoplasm of central portion of unspecified female breast: Secondary | ICD-10-CM | POA: Diagnosis not present

## 2013-08-22 MED ORDER — GADOBENATE DIMEGLUMINE 529 MG/ML IV SOLN
20.0000 mL | Freq: Once | INTRAVENOUS | Status: AC | PRN
  Administered 2013-08-22: 20 mL via INTRAVENOUS

## 2013-08-29 DIAGNOSIS — IMO0002 Reserved for concepts with insufficient information to code with codable children: Secondary | ICD-10-CM | POA: Diagnosis not present

## 2013-08-29 DIAGNOSIS — M999 Biomechanical lesion, unspecified: Secondary | ICD-10-CM | POA: Diagnosis not present

## 2013-08-30 DIAGNOSIS — R599 Enlarged lymph nodes, unspecified: Secondary | ICD-10-CM | POA: Diagnosis not present

## 2013-08-30 DIAGNOSIS — C50919 Malignant neoplasm of unspecified site of unspecified female breast: Secondary | ICD-10-CM | POA: Diagnosis not present

## 2013-08-30 DIAGNOSIS — C50119 Malignant neoplasm of central portion of unspecified female breast: Secondary | ICD-10-CM | POA: Diagnosis not present

## 2013-09-12 DIAGNOSIS — M999 Biomechanical lesion, unspecified: Secondary | ICD-10-CM | POA: Diagnosis not present

## 2013-09-12 DIAGNOSIS — IMO0002 Reserved for concepts with insufficient information to code with codable children: Secondary | ICD-10-CM | POA: Diagnosis not present

## 2013-09-13 DIAGNOSIS — E669 Obesity, unspecified: Secondary | ICD-10-CM | POA: Diagnosis not present

## 2013-09-13 DIAGNOSIS — Z6837 Body mass index (BMI) 37.0-37.9, adult: Secondary | ICD-10-CM | POA: Diagnosis not present

## 2013-09-13 DIAGNOSIS — C50919 Malignant neoplasm of unspecified site of unspecified female breast: Secondary | ICD-10-CM | POA: Diagnosis not present

## 2013-09-13 DIAGNOSIS — Z79899 Other long term (current) drug therapy: Secondary | ICD-10-CM | POA: Diagnosis not present

## 2013-09-13 DIAGNOSIS — I10 Essential (primary) hypertension: Secondary | ICD-10-CM | POA: Diagnosis not present

## 2013-09-13 DIAGNOSIS — M159 Polyosteoarthritis, unspecified: Secondary | ICD-10-CM | POA: Diagnosis not present

## 2013-09-13 DIAGNOSIS — F329 Major depressive disorder, single episode, unspecified: Secondary | ICD-10-CM | POA: Diagnosis not present

## 2013-09-13 DIAGNOSIS — C50119 Malignant neoplasm of central portion of unspecified female breast: Secondary | ICD-10-CM | POA: Diagnosis not present

## 2013-09-13 DIAGNOSIS — D360 Benign neoplasm of lymph nodes: Secondary | ICD-10-CM | POA: Diagnosis not present

## 2013-09-13 DIAGNOSIS — K219 Gastro-esophageal reflux disease without esophagitis: Secondary | ICD-10-CM | POA: Diagnosis not present

## 2013-09-13 DIAGNOSIS — C50419 Malignant neoplasm of upper-outer quadrant of unspecified female breast: Secondary | ICD-10-CM | POA: Diagnosis not present

## 2013-09-13 DIAGNOSIS — F3289 Other specified depressive episodes: Secondary | ICD-10-CM | POA: Diagnosis not present

## 2013-09-26 ENCOUNTER — Other Ambulatory Visit: Payer: Self-pay | Admitting: Vascular Surgery

## 2013-09-26 DIAGNOSIS — Z9889 Other specified postprocedural states: Secondary | ICD-10-CM

## 2013-09-26 DIAGNOSIS — Z853 Personal history of malignant neoplasm of breast: Secondary | ICD-10-CM

## 2013-10-13 DIAGNOSIS — I10 Essential (primary) hypertension: Secondary | ICD-10-CM | POA: Diagnosis not present

## 2013-10-13 DIAGNOSIS — C50119 Malignant neoplasm of central portion of unspecified female breast: Secondary | ICD-10-CM | POA: Diagnosis not present

## 2013-10-13 DIAGNOSIS — Z170 Estrogen receptor positive status [ER+]: Secondary | ICD-10-CM | POA: Diagnosis not present

## 2013-10-13 DIAGNOSIS — Z79899 Other long term (current) drug therapy: Secondary | ICD-10-CM | POA: Diagnosis not present

## 2013-10-13 DIAGNOSIS — Z853 Personal history of malignant neoplasm of breast: Secondary | ICD-10-CM | POA: Diagnosis not present

## 2013-10-13 DIAGNOSIS — K219 Gastro-esophageal reflux disease without esophagitis: Secondary | ICD-10-CM | POA: Diagnosis not present

## 2013-10-20 DIAGNOSIS — C50119 Malignant neoplasm of central portion of unspecified female breast: Secondary | ICD-10-CM | POA: Diagnosis not present

## 2013-10-20 DIAGNOSIS — Z510 Encounter for antineoplastic radiation therapy: Secondary | ICD-10-CM | POA: Diagnosis not present

## 2013-10-25 DIAGNOSIS — Z510 Encounter for antineoplastic radiation therapy: Secondary | ICD-10-CM | POA: Diagnosis not present

## 2013-10-25 DIAGNOSIS — C50119 Malignant neoplasm of central portion of unspecified female breast: Secondary | ICD-10-CM | POA: Diagnosis not present

## 2013-10-27 DIAGNOSIS — Z510 Encounter for antineoplastic radiation therapy: Secondary | ICD-10-CM | POA: Diagnosis not present

## 2013-10-27 DIAGNOSIS — C50119 Malignant neoplasm of central portion of unspecified female breast: Secondary | ICD-10-CM | POA: Diagnosis not present

## 2013-10-31 DIAGNOSIS — C50119 Malignant neoplasm of central portion of unspecified female breast: Secondary | ICD-10-CM | POA: Diagnosis not present

## 2013-10-31 DIAGNOSIS — Z510 Encounter for antineoplastic radiation therapy: Secondary | ICD-10-CM | POA: Diagnosis not present

## 2013-11-01 DIAGNOSIS — Z510 Encounter for antineoplastic radiation therapy: Secondary | ICD-10-CM | POA: Diagnosis not present

## 2013-11-01 DIAGNOSIS — C50119 Malignant neoplasm of central portion of unspecified female breast: Secondary | ICD-10-CM | POA: Diagnosis not present

## 2013-11-02 DIAGNOSIS — C50119 Malignant neoplasm of central portion of unspecified female breast: Secondary | ICD-10-CM | POA: Diagnosis not present

## 2013-11-02 DIAGNOSIS — K219 Gastro-esophageal reflux disease without esophagitis: Secondary | ICD-10-CM | POA: Diagnosis not present

## 2013-11-02 DIAGNOSIS — I10 Essential (primary) hypertension: Secondary | ICD-10-CM | POA: Diagnosis not present

## 2013-11-02 DIAGNOSIS — M1990 Unspecified osteoarthritis, unspecified site: Secondary | ICD-10-CM | POA: Diagnosis not present

## 2013-11-03 DIAGNOSIS — C50119 Malignant neoplasm of central portion of unspecified female breast: Secondary | ICD-10-CM | POA: Diagnosis not present

## 2013-11-03 DIAGNOSIS — Z510 Encounter for antineoplastic radiation therapy: Secondary | ICD-10-CM | POA: Diagnosis not present

## 2013-11-06 DIAGNOSIS — Z510 Encounter for antineoplastic radiation therapy: Secondary | ICD-10-CM | POA: Diagnosis not present

## 2013-11-06 DIAGNOSIS — C50119 Malignant neoplasm of central portion of unspecified female breast: Secondary | ICD-10-CM | POA: Diagnosis not present

## 2013-11-07 DIAGNOSIS — Z170 Estrogen receptor positive status [ER+]: Secondary | ICD-10-CM | POA: Diagnosis not present

## 2013-11-07 DIAGNOSIS — Z510 Encounter for antineoplastic radiation therapy: Secondary | ICD-10-CM | POA: Diagnosis not present

## 2013-11-07 DIAGNOSIS — D230 Other benign neoplasm of skin of lip: Secondary | ICD-10-CM | POA: Diagnosis not present

## 2013-11-07 DIAGNOSIS — L578 Other skin changes due to chronic exposure to nonionizing radiation: Secondary | ICD-10-CM | POA: Diagnosis not present

## 2013-11-07 DIAGNOSIS — L821 Other seborrheic keratosis: Secondary | ICD-10-CM | POA: Diagnosis not present

## 2013-11-07 DIAGNOSIS — D485 Neoplasm of uncertain behavior of skin: Secondary | ICD-10-CM | POA: Diagnosis not present

## 2013-11-07 DIAGNOSIS — C50119 Malignant neoplasm of central portion of unspecified female breast: Secondary | ICD-10-CM | POA: Diagnosis not present

## 2013-11-08 DIAGNOSIS — C50119 Malignant neoplasm of central portion of unspecified female breast: Secondary | ICD-10-CM | POA: Diagnosis not present

## 2013-11-08 DIAGNOSIS — Z510 Encounter for antineoplastic radiation therapy: Secondary | ICD-10-CM | POA: Diagnosis not present

## 2013-11-09 DIAGNOSIS — Z510 Encounter for antineoplastic radiation therapy: Secondary | ICD-10-CM | POA: Diagnosis not present

## 2013-11-09 DIAGNOSIS — M999 Biomechanical lesion, unspecified: Secondary | ICD-10-CM | POA: Diagnosis not present

## 2013-11-09 DIAGNOSIS — C50119 Malignant neoplasm of central portion of unspecified female breast: Secondary | ICD-10-CM | POA: Diagnosis not present

## 2013-11-09 DIAGNOSIS — IMO0002 Reserved for concepts with insufficient information to code with codable children: Secondary | ICD-10-CM | POA: Diagnosis not present

## 2013-11-10 ENCOUNTER — Telehealth: Payer: Self-pay | Admitting: *Deleted

## 2013-11-10 DIAGNOSIS — Z510 Encounter for antineoplastic radiation therapy: Secondary | ICD-10-CM | POA: Diagnosis not present

## 2013-11-10 DIAGNOSIS — C50119 Malignant neoplasm of central portion of unspecified female breast: Secondary | ICD-10-CM | POA: Diagnosis not present

## 2013-11-10 NOTE — Telephone Encounter (Signed)
Called pt and confirmed 11/23/13 genetic appt w/ her.  Mailed welcoming packet & calendar to pt.  Called Denise at referring to make her aware.

## 2013-11-14 DIAGNOSIS — C50119 Malignant neoplasm of central portion of unspecified female breast: Secondary | ICD-10-CM | POA: Diagnosis not present

## 2013-11-14 DIAGNOSIS — Z510 Encounter for antineoplastic radiation therapy: Secondary | ICD-10-CM | POA: Diagnosis not present

## 2013-11-15 DIAGNOSIS — Z510 Encounter for antineoplastic radiation therapy: Secondary | ICD-10-CM | POA: Diagnosis not present

## 2013-11-15 DIAGNOSIS — C50119 Malignant neoplasm of central portion of unspecified female breast: Secondary | ICD-10-CM | POA: Diagnosis not present

## 2013-11-16 DIAGNOSIS — C50119 Malignant neoplasm of central portion of unspecified female breast: Secondary | ICD-10-CM | POA: Diagnosis not present

## 2013-11-16 DIAGNOSIS — Z510 Encounter for antineoplastic radiation therapy: Secondary | ICD-10-CM | POA: Diagnosis not present

## 2013-11-17 DIAGNOSIS — C50119 Malignant neoplasm of central portion of unspecified female breast: Secondary | ICD-10-CM | POA: Diagnosis not present

## 2013-11-17 DIAGNOSIS — Z510 Encounter for antineoplastic radiation therapy: Secondary | ICD-10-CM | POA: Diagnosis not present

## 2013-11-20 DIAGNOSIS — C50119 Malignant neoplasm of central portion of unspecified female breast: Secondary | ICD-10-CM | POA: Diagnosis not present

## 2013-11-20 DIAGNOSIS — Z510 Encounter for antineoplastic radiation therapy: Secondary | ICD-10-CM | POA: Diagnosis not present

## 2013-11-21 DIAGNOSIS — C50119 Malignant neoplasm of central portion of unspecified female breast: Secondary | ICD-10-CM | POA: Diagnosis not present

## 2013-11-21 DIAGNOSIS — Z510 Encounter for antineoplastic radiation therapy: Secondary | ICD-10-CM | POA: Diagnosis not present

## 2013-11-22 DIAGNOSIS — C50119 Malignant neoplasm of central portion of unspecified female breast: Secondary | ICD-10-CM | POA: Diagnosis not present

## 2013-11-22 DIAGNOSIS — Z510 Encounter for antineoplastic radiation therapy: Secondary | ICD-10-CM | POA: Diagnosis not present

## 2013-11-23 ENCOUNTER — Other Ambulatory Visit: Payer: Medicare Other

## 2013-11-23 ENCOUNTER — Ambulatory Visit (HOSPITAL_BASED_OUTPATIENT_CLINIC_OR_DEPARTMENT_OTHER): Payer: Medicare Other | Admitting: Genetic Counselor

## 2013-11-23 ENCOUNTER — Encounter: Payer: Self-pay | Admitting: Genetic Counselor

## 2013-11-23 DIAGNOSIS — C50912 Malignant neoplasm of unspecified site of left female breast: Principal | ICD-10-CM

## 2013-11-23 DIAGNOSIS — C50219 Malignant neoplasm of upper-inner quadrant of unspecified female breast: Secondary | ICD-10-CM | POA: Diagnosis not present

## 2013-11-23 DIAGNOSIS — C50919 Malignant neoplasm of unspecified site of unspecified female breast: Secondary | ICD-10-CM | POA: Diagnosis not present

## 2013-11-23 DIAGNOSIS — IMO0002 Reserved for concepts with insufficient information to code with codable children: Secondary | ICD-10-CM

## 2013-11-23 DIAGNOSIS — Z803 Family history of malignant neoplasm of breast: Secondary | ICD-10-CM | POA: Diagnosis not present

## 2013-11-23 DIAGNOSIS — C50911 Malignant neoplasm of unspecified site of right female breast: Secondary | ICD-10-CM

## 2013-11-23 DIAGNOSIS — Z808 Family history of malignant neoplasm of other organs or systems: Secondary | ICD-10-CM

## 2013-11-23 DIAGNOSIS — C50119 Malignant neoplasm of central portion of unspecified female breast: Secondary | ICD-10-CM | POA: Diagnosis not present

## 2013-11-23 DIAGNOSIS — Z8041 Family history of malignant neoplasm of ovary: Secondary | ICD-10-CM | POA: Diagnosis not present

## 2013-11-23 DIAGNOSIS — Z510 Encounter for antineoplastic radiation therapy: Secondary | ICD-10-CM | POA: Diagnosis not present

## 2013-11-23 HISTORY — DX: Family history of malignant neoplasm of breast: Z80.3

## 2013-11-23 HISTORY — DX: Malignant neoplasm of unspecified site of right female breast: C50.911

## 2013-11-23 HISTORY — DX: Family history of malignant neoplasm of other organs or systems: Z80.8

## 2013-11-23 NOTE — Progress Notes (Signed)
HISTORY OF PRESENT ILLNESS: Dr. Hinton Rao requested a cancer genetics consultation for Heather Chavez, a 68 y.o. female, due to a personal and family history of cancer.  Heather Chavez presents to clinic today to discuss the possibility of a hereditary predisposition to cancer, genetic testing, and to further clarify her future cancer risks, as well as potential cancer risk for family members.   Heather Chavez was diagnosed with right breast cancer at age 31 and underwent lumpectomy at the time. She more recently was diagnosed with left breast cancer (IDC, ER+PR+Her2NEU-) at age 51 and is also s/p lumpectomy. She had a TAH due to prolapse around age 48 and at the time underwent an USO for unknown reasons. She does not recall which ovary remains. She has a history of basal cell and squamous cell carcinoma of the skin, but no melanoma history.    FAMILY HISTORY:  During the visit, a 4-generation pedigree was obtained. Significant diagnoses include the following:  Family History  Problem Relation Age of Onset   Cancer Chavez 100    bilateral breast cancer ages 36 then 80. Peritoneal cancer at age 33.   Other Chavez     Heather Chavez was adopted so have no information regarding maternal family  history other than her Chavez   Cancer Father 34    renal   Cancer Sister 82    breast   Cancer Paternal Aunt 92    breast   Cancer Paternal Grandmother     bilateral breast cancer at unknown age    Heather Chavez's ancestry is of Caucasian descent. There is no known Jewish ancestry or consanguinity.  GENETIC COUNSELING ASSESSMENT: Heather Chavez is a 68 y.o. female with a personal and family history of cancer suggestive of a hereditary predisposition to cancer. We, therefore, discussed and recommended the following at today's visit.   DISCUSSION: We reviewed the characteristics, features and inheritance patterns of hereditary cancer syndromes. We also discussed genetic testing, including the appropriate  family members to test, the process of testing, insurance coverage and turn-around-time for results. We discussed the implications of a negative, positive and/or variant of uncertain significant result. We recommended Heather Chavez pursue genetic testing for the [employer]Next gene panel.   PLAN: Based on our above recommendation, Heather Chavez wished to pursue genetic testing and the blood sample was drawn and will be sent to [facility] for analysis. Results should be available within approximately 5 weeks time, at which point they will be disclosed by telephone to Heather Chavez, as will any additional recommendations warranted by these results. We also encouraged Heather Chavez to remain in contact with cancer genetics annually so that we can continuously update the family history and inform her of any changes in cancer genetics and testing that may be of benefit for this family. [name] questions were answered to her satisfaction today. Our contact information was provided should additional questions or concerns arise.   Thank you for the referral and allowing [place] to share in the care of your patient.   The patient was seen for a total of 45 minutes in face-to-face genetic counseling.  This patient was discussed with Dr. Jana Hakim who agrees with the above.    _______________________________________________________________________ For Office Staff:  Number of people involved in session: 3 Was an Intern/ student involved with case: not applicable

## 2013-11-24 DIAGNOSIS — C50119 Malignant neoplasm of central portion of unspecified female breast: Secondary | ICD-10-CM | POA: Diagnosis not present

## 2013-11-24 DIAGNOSIS — Z510 Encounter for antineoplastic radiation therapy: Secondary | ICD-10-CM | POA: Diagnosis not present

## 2013-11-27 DIAGNOSIS — C50119 Malignant neoplasm of central portion of unspecified female breast: Secondary | ICD-10-CM | POA: Diagnosis not present

## 2013-11-27 DIAGNOSIS — Z510 Encounter for antineoplastic radiation therapy: Secondary | ICD-10-CM | POA: Diagnosis not present

## 2013-11-28 DIAGNOSIS — IMO0002 Reserved for concepts with insufficient information to code with codable children: Secondary | ICD-10-CM | POA: Diagnosis not present

## 2013-11-28 DIAGNOSIS — M999 Biomechanical lesion, unspecified: Secondary | ICD-10-CM | POA: Diagnosis not present

## 2013-11-28 DIAGNOSIS — C50119 Malignant neoplasm of central portion of unspecified female breast: Secondary | ICD-10-CM | POA: Diagnosis not present

## 2013-11-28 DIAGNOSIS — Z510 Encounter for antineoplastic radiation therapy: Secondary | ICD-10-CM | POA: Diagnosis not present

## 2013-11-29 DIAGNOSIS — C50119 Malignant neoplasm of central portion of unspecified female breast: Secondary | ICD-10-CM | POA: Diagnosis not present

## 2013-11-29 DIAGNOSIS — Z510 Encounter for antineoplastic radiation therapy: Secondary | ICD-10-CM | POA: Diagnosis not present

## 2013-11-30 DIAGNOSIS — C50119 Malignant neoplasm of central portion of unspecified female breast: Secondary | ICD-10-CM | POA: Diagnosis not present

## 2013-12-01 DIAGNOSIS — C50119 Malignant neoplasm of central portion of unspecified female breast: Secondary | ICD-10-CM | POA: Diagnosis not present

## 2013-12-01 DIAGNOSIS — Z510 Encounter for antineoplastic radiation therapy: Secondary | ICD-10-CM | POA: Diagnosis not present

## 2013-12-06 DIAGNOSIS — IMO0002 Reserved for concepts with insufficient information to code with codable children: Secondary | ICD-10-CM | POA: Diagnosis not present

## 2013-12-06 DIAGNOSIS — M999 Biomechanical lesion, unspecified: Secondary | ICD-10-CM | POA: Diagnosis not present

## 2013-12-06 DIAGNOSIS — C50119 Malignant neoplasm of central portion of unspecified female breast: Secondary | ICD-10-CM | POA: Diagnosis not present

## 2013-12-07 DIAGNOSIS — Z510 Encounter for antineoplastic radiation therapy: Secondary | ICD-10-CM | POA: Diagnosis not present

## 2013-12-07 DIAGNOSIS — C50112 Malignant neoplasm of central portion of left female breast: Secondary | ICD-10-CM | POA: Diagnosis not present

## 2013-12-08 DIAGNOSIS — Z510 Encounter for antineoplastic radiation therapy: Secondary | ICD-10-CM | POA: Diagnosis not present

## 2013-12-08 DIAGNOSIS — C50112 Malignant neoplasm of central portion of left female breast: Secondary | ICD-10-CM | POA: Diagnosis not present

## 2013-12-11 DIAGNOSIS — C50112 Malignant neoplasm of central portion of left female breast: Secondary | ICD-10-CM | POA: Diagnosis not present

## 2013-12-11 DIAGNOSIS — Z510 Encounter for antineoplastic radiation therapy: Secondary | ICD-10-CM | POA: Diagnosis not present

## 2013-12-12 DIAGNOSIS — C50112 Malignant neoplasm of central portion of left female breast: Secondary | ICD-10-CM | POA: Diagnosis not present

## 2013-12-12 DIAGNOSIS — Z510 Encounter for antineoplastic radiation therapy: Secondary | ICD-10-CM | POA: Diagnosis not present

## 2013-12-19 DIAGNOSIS — M9901 Segmental and somatic dysfunction of cervical region: Secondary | ICD-10-CM | POA: Diagnosis not present

## 2013-12-19 DIAGNOSIS — M9905 Segmental and somatic dysfunction of pelvic region: Secondary | ICD-10-CM | POA: Diagnosis not present

## 2013-12-19 DIAGNOSIS — M5413 Radiculopathy, cervicothoracic region: Secondary | ICD-10-CM | POA: Diagnosis not present

## 2013-12-19 DIAGNOSIS — M9903 Segmental and somatic dysfunction of lumbar region: Secondary | ICD-10-CM | POA: Diagnosis not present

## 2013-12-19 DIAGNOSIS — M5416 Radiculopathy, lumbar region: Secondary | ICD-10-CM | POA: Diagnosis not present

## 2013-12-25 ENCOUNTER — Encounter: Payer: Self-pay | Admitting: Genetic Counselor

## 2013-12-25 DIAGNOSIS — Z808 Family history of malignant neoplasm of other organs or systems: Secondary | ICD-10-CM

## 2013-12-25 DIAGNOSIS — C50911 Malignant neoplasm of unspecified site of right female breast: Secondary | ICD-10-CM

## 2013-12-25 DIAGNOSIS — Z803 Family history of malignant neoplasm of breast: Secondary | ICD-10-CM

## 2013-12-25 DIAGNOSIS — C50912 Malignant neoplasm of unspecified site of left female breast: Principal | ICD-10-CM

## 2013-12-25 NOTE — Progress Notes (Signed)
HPI:  Ms. Dohmen was previously seen in the [facility] due to a personal and family history of cancer and concerns regarding a hereditary predisposition to cancer. Please refer to our prior cancer genetics clinic note for more information regarding Ms. Eltzroth's medical, social and family histories, and our assessment and recommendations, at the time. Ms. Setter recent genetic test results were disclosed to her, as were recommendations warranted by these results. These results and recommendations are discussed in more detail below.  GENETIC TEST RESULTS: At the time of Ms. Felber's visit, we recommended she pursue genetic testing of the OvaNext gene panel. This test, which included sequencing and deletion/duplication analysis of the genes, was performed at [facility]. Genetic testing was normal, and did not reveal a deleterious mutation in these genes. A complete list of all genes tested is located on the test report scanned into EP[employer].  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 At this time, it is unknown if this variant is associated with an increased risk for cancer or if this is a normal finding. With time, we suspect the lab will reclassify this variant and when they do, we will try to re-contact Ms. File to discuss the reclassification further.   We discussed with Ms. Burgo that since the current genetic testing is not perfect, it is possible there may be a gene mutation in one of these genes that current testing cannot detect.  We also discussed, that it is possible that another gene that has not yet been discovered, or that we have not yet tested, is responsible for the cancer diagnoses in the family, and it is, therefore, important to remain in touch with cancer genetics in the future so that we can continue to offer Ms. Slone the most up to date genetic testing.   CANCER SCREENING RECOMMENDATIONS: Given the  personal and family histories, we must interpret these negative results with some caution.  Families with features suggestive of hereditary risk tend to have multiple family members with cancer, diagnoses in multiple generations and diagnoses before the age of 21. Ms. Rigdon's family exhibits some of these features and there is limited maternal family history since her mother was adopted. Thus, this result may simply reflect our current inability to detect all mutations within these genes, or as mentioned above, may be a different gene that has not yet been discovered or tested.   We, therefore, discussed that it is reasonable for Ms. Tarnow to continue to be followed by a high-risk breast clinic; for any remaining breast tissue, in addition to a yearly mammogram and physical exam twice per year, she should discuss the usefulness of an annual breast MRI with the high-risk clinic providers. We also discussed that a transvaginal ultrasound and a CA-125 blood test can be considered for ovarian cancer screening, although these tests have not been shown to be effective in detecting this cancer early.  Thus, some women at increased risk of ovarian cancer choose to undergo removal of the ovaries and fallopian tubes. We recommended Ms. Randle also discuss removal of her one remaining ovary further with her gynecologist and/or primary providers.  RECOMMENDATIONS FOR FAMILY MEMBERS:  We recommended further genetic testing in Ms. Giannini's family as such testing might help [place] be even more confident in interpreting Ms. Hast's own results. We recommend Ms. Lattner's sister, who was diagnosed with breast cancer at age 25, have genetic counseling and testing. Please let us know if  we can help facilitate testing. Genetic counselors can be located in other cities, by visiting the website of the [employer] (ArtistMovie.se) and Field seismologist for a Dietitian by zip code.  Given the family history,  Ms. Gum's female relatives may also be at increased risk for cancer over the general population.  Female relatives in this family should consider being followed at a high-risk breast clinic and have a yearly clinical breast exam, mammogram beginning at age 67, and perform monthly breast self-exams.These relatives should discuss the utility of breast MRI screening with their own healthcare provider, and also discuss with their gynecologists ovarian cancer screening and surgical options available. Women in this family should also have a gynecological exam as recommended by their primary provider. All family members should have a colonoscopy by age 10.  FOLLOW-UP: Lastly, we discussed with Ms. Halls that cancer genetics is a rapidly advancing field and it is possible that new genetic tests will be appropriate for her and/or her family members in the future. We encouraged her to remain in contact with cancer genetics on an annual basis so we can update her personal and family histories and let her know of advances in cancer genetics that may benefit this family.   Our contact number was provided. Ms. Konz questions were answered to her satisfaction, and she knows she is welcome to call us at anytime with additional questions or concerns.   Catherine A. Fine, MS, CGC Certified Psychologist, sport [email or web address]

## 2013-12-26 DIAGNOSIS — M9903 Segmental and somatic dysfunction of lumbar region: Secondary | ICD-10-CM | POA: Diagnosis not present

## 2013-12-26 DIAGNOSIS — M5416 Radiculopathy, lumbar region: Secondary | ICD-10-CM | POA: Diagnosis not present

## 2013-12-26 DIAGNOSIS — M9901 Segmental and somatic dysfunction of cervical region: Secondary | ICD-10-CM | POA: Diagnosis not present

## 2013-12-26 DIAGNOSIS — M9905 Segmental and somatic dysfunction of pelvic region: Secondary | ICD-10-CM | POA: Diagnosis not present

## 2013-12-26 DIAGNOSIS — M5413 Radiculopathy, cervicothoracic region: Secondary | ICD-10-CM | POA: Diagnosis not present

## 2013-12-28 DIAGNOSIS — M9905 Segmental and somatic dysfunction of pelvic region: Secondary | ICD-10-CM | POA: Diagnosis not present

## 2013-12-28 DIAGNOSIS — M9903 Segmental and somatic dysfunction of lumbar region: Secondary | ICD-10-CM | POA: Diagnosis not present

## 2013-12-28 DIAGNOSIS — M5413 Radiculopathy, cervicothoracic region: Secondary | ICD-10-CM | POA: Diagnosis not present

## 2013-12-28 DIAGNOSIS — M9901 Segmental and somatic dysfunction of cervical region: Secondary | ICD-10-CM | POA: Diagnosis not present

## 2013-12-28 DIAGNOSIS — M5416 Radiculopathy, lumbar region: Secondary | ICD-10-CM | POA: Diagnosis not present

## 2014-01-12 DIAGNOSIS — Z79811 Long term (current) use of aromatase inhibitors: Secondary | ICD-10-CM | POA: Diagnosis not present

## 2014-01-12 DIAGNOSIS — C50112 Malignant neoplasm of central portion of left female breast: Secondary | ICD-10-CM | POA: Diagnosis not present

## 2014-02-13 DIAGNOSIS — M5416 Radiculopathy, lumbar region: Secondary | ICD-10-CM | POA: Diagnosis not present

## 2014-02-13 DIAGNOSIS — M9903 Segmental and somatic dysfunction of lumbar region: Secondary | ICD-10-CM | POA: Diagnosis not present

## 2014-02-13 DIAGNOSIS — M5413 Radiculopathy, cervicothoracic region: Secondary | ICD-10-CM | POA: Diagnosis not present

## 2014-02-13 DIAGNOSIS — M9905 Segmental and somatic dysfunction of pelvic region: Secondary | ICD-10-CM | POA: Diagnosis not present

## 2014-02-13 DIAGNOSIS — M9901 Segmental and somatic dysfunction of cervical region: Secondary | ICD-10-CM | POA: Diagnosis not present

## 2014-02-20 DIAGNOSIS — M9903 Segmental and somatic dysfunction of lumbar region: Secondary | ICD-10-CM | POA: Diagnosis not present

## 2014-02-20 DIAGNOSIS — M5416 Radiculopathy, lumbar region: Secondary | ICD-10-CM | POA: Diagnosis not present

## 2014-02-20 DIAGNOSIS — M9905 Segmental and somatic dysfunction of pelvic region: Secondary | ICD-10-CM | POA: Diagnosis not present

## 2014-02-20 DIAGNOSIS — M9901 Segmental and somatic dysfunction of cervical region: Secondary | ICD-10-CM | POA: Diagnosis not present

## 2014-02-20 DIAGNOSIS — M5413 Radiculopathy, cervicothoracic region: Secondary | ICD-10-CM | POA: Diagnosis not present

## 2014-02-22 DIAGNOSIS — Z23 Encounter for immunization: Secondary | ICD-10-CM | POA: Diagnosis not present

## 2014-02-22 DIAGNOSIS — K5732 Diverticulitis of large intestine without perforation or abscess without bleeding: Secondary | ICD-10-CM | POA: Diagnosis not present

## 2014-02-27 DIAGNOSIS — M9901 Segmental and somatic dysfunction of cervical region: Secondary | ICD-10-CM | POA: Diagnosis not present

## 2014-02-27 DIAGNOSIS — M5413 Radiculopathy, cervicothoracic region: Secondary | ICD-10-CM | POA: Diagnosis not present

## 2014-02-27 DIAGNOSIS — M9905 Segmental and somatic dysfunction of pelvic region: Secondary | ICD-10-CM | POA: Diagnosis not present

## 2014-02-27 DIAGNOSIS — M5416 Radiculopathy, lumbar region: Secondary | ICD-10-CM | POA: Diagnosis not present

## 2014-02-27 DIAGNOSIS — M9903 Segmental and somatic dysfunction of lumbar region: Secondary | ICD-10-CM | POA: Diagnosis not present

## 2014-03-22 DIAGNOSIS — C50112 Malignant neoplasm of central portion of left female breast: Secondary | ICD-10-CM | POA: Diagnosis not present

## 2014-03-22 DIAGNOSIS — F329 Major depressive disorder, single episode, unspecified: Secondary | ICD-10-CM | POA: Diagnosis not present

## 2014-04-05 DIAGNOSIS — M5413 Radiculopathy, cervicothoracic region: Secondary | ICD-10-CM | POA: Diagnosis not present

## 2014-04-05 DIAGNOSIS — M5416 Radiculopathy, lumbar region: Secondary | ICD-10-CM | POA: Diagnosis not present

## 2014-04-05 DIAGNOSIS — M9905 Segmental and somatic dysfunction of pelvic region: Secondary | ICD-10-CM | POA: Diagnosis not present

## 2014-04-05 DIAGNOSIS — M9901 Segmental and somatic dysfunction of cervical region: Secondary | ICD-10-CM | POA: Diagnosis not present

## 2014-04-05 DIAGNOSIS — M9903 Segmental and somatic dysfunction of lumbar region: Secondary | ICD-10-CM | POA: Diagnosis not present

## 2014-04-12 DIAGNOSIS — M5416 Radiculopathy, lumbar region: Secondary | ICD-10-CM | POA: Diagnosis not present

## 2014-04-12 DIAGNOSIS — M5413 Radiculopathy, cervicothoracic region: Secondary | ICD-10-CM | POA: Diagnosis not present

## 2014-04-12 DIAGNOSIS — M9901 Segmental and somatic dysfunction of cervical region: Secondary | ICD-10-CM | POA: Diagnosis not present

## 2014-04-12 DIAGNOSIS — M9903 Segmental and somatic dysfunction of lumbar region: Secondary | ICD-10-CM | POA: Diagnosis not present

## 2014-04-12 DIAGNOSIS — M9905 Segmental and somatic dysfunction of pelvic region: Secondary | ICD-10-CM | POA: Diagnosis not present

## 2014-04-17 DIAGNOSIS — M9905 Segmental and somatic dysfunction of pelvic region: Secondary | ICD-10-CM | POA: Diagnosis not present

## 2014-04-17 DIAGNOSIS — M5413 Radiculopathy, cervicothoracic region: Secondary | ICD-10-CM | POA: Diagnosis not present

## 2014-04-17 DIAGNOSIS — M9901 Segmental and somatic dysfunction of cervical region: Secondary | ICD-10-CM | POA: Diagnosis not present

## 2014-04-17 DIAGNOSIS — M5416 Radiculopathy, lumbar region: Secondary | ICD-10-CM | POA: Diagnosis not present

## 2014-04-17 DIAGNOSIS — M9903 Segmental and somatic dysfunction of lumbar region: Secondary | ICD-10-CM | POA: Diagnosis not present

## 2014-04-24 DIAGNOSIS — M5416 Radiculopathy, lumbar region: Secondary | ICD-10-CM | POA: Diagnosis not present

## 2014-04-24 DIAGNOSIS — M9903 Segmental and somatic dysfunction of lumbar region: Secondary | ICD-10-CM | POA: Diagnosis not present

## 2014-04-24 DIAGNOSIS — M5413 Radiculopathy, cervicothoracic region: Secondary | ICD-10-CM | POA: Diagnosis not present

## 2014-04-24 DIAGNOSIS — M9905 Segmental and somatic dysfunction of pelvic region: Secondary | ICD-10-CM | POA: Diagnosis not present

## 2014-04-24 DIAGNOSIS — M9901 Segmental and somatic dysfunction of cervical region: Secondary | ICD-10-CM | POA: Diagnosis not present

## 2014-04-26 DIAGNOSIS — M5413 Radiculopathy, cervicothoracic region: Secondary | ICD-10-CM | POA: Diagnosis not present

## 2014-04-26 DIAGNOSIS — M9905 Segmental and somatic dysfunction of pelvic region: Secondary | ICD-10-CM | POA: Diagnosis not present

## 2014-04-26 DIAGNOSIS — M5416 Radiculopathy, lumbar region: Secondary | ICD-10-CM | POA: Diagnosis not present

## 2014-04-26 DIAGNOSIS — M9901 Segmental and somatic dysfunction of cervical region: Secondary | ICD-10-CM | POA: Diagnosis not present

## 2014-04-26 DIAGNOSIS — M9903 Segmental and somatic dysfunction of lumbar region: Secondary | ICD-10-CM | POA: Diagnosis not present

## 2014-05-01 DIAGNOSIS — M9905 Segmental and somatic dysfunction of pelvic region: Secondary | ICD-10-CM | POA: Diagnosis not present

## 2014-05-01 DIAGNOSIS — M9903 Segmental and somatic dysfunction of lumbar region: Secondary | ICD-10-CM | POA: Diagnosis not present

## 2014-05-01 DIAGNOSIS — M9901 Segmental and somatic dysfunction of cervical region: Secondary | ICD-10-CM | POA: Diagnosis not present

## 2014-05-01 DIAGNOSIS — M5416 Radiculopathy, lumbar region: Secondary | ICD-10-CM | POA: Diagnosis not present

## 2014-05-01 DIAGNOSIS — M5413 Radiculopathy, cervicothoracic region: Secondary | ICD-10-CM | POA: Diagnosis not present

## 2014-06-15 DIAGNOSIS — M9901 Segmental and somatic dysfunction of cervical region: Secondary | ICD-10-CM | POA: Diagnosis not present

## 2014-06-15 DIAGNOSIS — M5413 Radiculopathy, cervicothoracic region: Secondary | ICD-10-CM | POA: Diagnosis not present

## 2014-06-15 DIAGNOSIS — M5416 Radiculopathy, lumbar region: Secondary | ICD-10-CM | POA: Diagnosis not present

## 2014-06-15 DIAGNOSIS — M9905 Segmental and somatic dysfunction of pelvic region: Secondary | ICD-10-CM | POA: Diagnosis not present

## 2014-06-15 DIAGNOSIS — M9903 Segmental and somatic dysfunction of lumbar region: Secondary | ICD-10-CM | POA: Diagnosis not present

## 2014-06-19 DIAGNOSIS — M5416 Radiculopathy, lumbar region: Secondary | ICD-10-CM | POA: Diagnosis not present

## 2014-06-19 DIAGNOSIS — M5413 Radiculopathy, cervicothoracic region: Secondary | ICD-10-CM | POA: Diagnosis not present

## 2014-06-19 DIAGNOSIS — M9901 Segmental and somatic dysfunction of cervical region: Secondary | ICD-10-CM | POA: Diagnosis not present

## 2014-06-19 DIAGNOSIS — M9905 Segmental and somatic dysfunction of pelvic region: Secondary | ICD-10-CM | POA: Diagnosis not present

## 2014-06-19 DIAGNOSIS — M9903 Segmental and somatic dysfunction of lumbar region: Secondary | ICD-10-CM | POA: Diagnosis not present

## 2014-06-20 DIAGNOSIS — C50112 Malignant neoplasm of central portion of left female breast: Secondary | ICD-10-CM | POA: Diagnosis not present

## 2014-06-22 DIAGNOSIS — M9903 Segmental and somatic dysfunction of lumbar region: Secondary | ICD-10-CM | POA: Diagnosis not present

## 2014-06-22 DIAGNOSIS — M5416 Radiculopathy, lumbar region: Secondary | ICD-10-CM | POA: Diagnosis not present

## 2014-06-22 DIAGNOSIS — M9901 Segmental and somatic dysfunction of cervical region: Secondary | ICD-10-CM | POA: Diagnosis not present

## 2014-06-22 DIAGNOSIS — M9905 Segmental and somatic dysfunction of pelvic region: Secondary | ICD-10-CM | POA: Diagnosis not present

## 2014-06-22 DIAGNOSIS — M5413 Radiculopathy, cervicothoracic region: Secondary | ICD-10-CM | POA: Diagnosis not present

## 2014-06-26 DIAGNOSIS — M9905 Segmental and somatic dysfunction of pelvic region: Secondary | ICD-10-CM | POA: Diagnosis not present

## 2014-06-26 DIAGNOSIS — M9901 Segmental and somatic dysfunction of cervical region: Secondary | ICD-10-CM | POA: Diagnosis not present

## 2014-06-26 DIAGNOSIS — M9903 Segmental and somatic dysfunction of lumbar region: Secondary | ICD-10-CM | POA: Diagnosis not present

## 2014-06-26 DIAGNOSIS — M5416 Radiculopathy, lumbar region: Secondary | ICD-10-CM | POA: Diagnosis not present

## 2014-06-26 DIAGNOSIS — M5413 Radiculopathy, cervicothoracic region: Secondary | ICD-10-CM | POA: Diagnosis not present

## 2014-06-28 DIAGNOSIS — Z853 Personal history of malignant neoplasm of breast: Secondary | ICD-10-CM | POA: Diagnosis not present

## 2014-06-28 DIAGNOSIS — N63 Unspecified lump in breast: Secondary | ICD-10-CM | POA: Diagnosis not present

## 2014-06-28 DIAGNOSIS — R928 Other abnormal and inconclusive findings on diagnostic imaging of breast: Secondary | ICD-10-CM | POA: Diagnosis not present

## 2014-06-28 DIAGNOSIS — Z803 Family history of malignant neoplasm of breast: Secondary | ICD-10-CM | POA: Diagnosis not present

## 2014-06-28 DIAGNOSIS — Z9012 Acquired absence of left breast and nipple: Secondary | ICD-10-CM | POA: Diagnosis not present

## 2014-06-28 DIAGNOSIS — N641 Fat necrosis of breast: Secondary | ICD-10-CM | POA: Diagnosis not present

## 2014-07-19 DIAGNOSIS — Z961 Presence of intraocular lens: Secondary | ICD-10-CM | POA: Diagnosis not present

## 2014-07-19 DIAGNOSIS — H3531 Nonexudative age-related macular degeneration: Secondary | ICD-10-CM | POA: Diagnosis not present

## 2014-07-24 DIAGNOSIS — Z961 Presence of intraocular lens: Secondary | ICD-10-CM | POA: Diagnosis not present

## 2014-07-24 DIAGNOSIS — H5203 Hypermetropia, bilateral: Secondary | ICD-10-CM | POA: Diagnosis not present

## 2014-07-24 DIAGNOSIS — H3531 Nonexudative age-related macular degeneration: Secondary | ICD-10-CM | POA: Diagnosis not present

## 2014-07-27 ENCOUNTER — Ambulatory Visit
Admission: RE | Admit: 2014-07-27 | Discharge: 2014-07-27 | Disposition: A | Discharge status: Home / Self Care | Payer: Medicare Other | Source: Ambulatory Visit | Attending: Vascular Surgery | Admitting: Vascular Surgery

## 2014-07-27 DIAGNOSIS — Z853 Personal history of malignant neoplasm of breast: Secondary | ICD-10-CM | POA: Diagnosis not present

## 2014-07-27 DIAGNOSIS — Z9889 Other specified postprocedural states: Secondary | ICD-10-CM

## 2014-07-27 DIAGNOSIS — N6489 Other specified disorders of breast: Secondary | ICD-10-CM | POA: Diagnosis not present

## 2014-08-13 ENCOUNTER — Other Ambulatory Visit: Payer: Self-pay | Admitting: Vascular Surgery

## 2014-08-13 DIAGNOSIS — Z6837 Body mass index (BMI) 37.0-37.9, adult: Secondary | ICD-10-CM | POA: Diagnosis not present

## 2014-08-13 DIAGNOSIS — C50112 Malignant neoplasm of central portion of left female breast: Secondary | ICD-10-CM | POA: Diagnosis not present

## 2014-08-13 DIAGNOSIS — Z853 Personal history of malignant neoplasm of breast: Secondary | ICD-10-CM

## 2014-08-13 DIAGNOSIS — Z170 Estrogen receptor positive status [ER+]: Secondary | ICD-10-CM | POA: Diagnosis not present

## 2014-08-13 DIAGNOSIS — E669 Obesity, unspecified: Secondary | ICD-10-CM | POA: Diagnosis not present

## 2014-08-16 DIAGNOSIS — M5416 Radiculopathy, lumbar region: Secondary | ICD-10-CM | POA: Diagnosis not present

## 2014-08-16 DIAGNOSIS — M9903 Segmental and somatic dysfunction of lumbar region: Secondary | ICD-10-CM | POA: Diagnosis not present

## 2014-08-16 DIAGNOSIS — M9901 Segmental and somatic dysfunction of cervical region: Secondary | ICD-10-CM | POA: Diagnosis not present

## 2014-08-16 DIAGNOSIS — M5413 Radiculopathy, cervicothoracic region: Secondary | ICD-10-CM | POA: Diagnosis not present

## 2014-08-16 DIAGNOSIS — M9905 Segmental and somatic dysfunction of pelvic region: Secondary | ICD-10-CM | POA: Diagnosis not present

## 2014-09-06 DIAGNOSIS — M5413 Radiculopathy, cervicothoracic region: Secondary | ICD-10-CM | POA: Diagnosis not present

## 2014-09-06 DIAGNOSIS — M9903 Segmental and somatic dysfunction of lumbar region: Secondary | ICD-10-CM | POA: Diagnosis not present

## 2014-09-06 DIAGNOSIS — M5416 Radiculopathy, lumbar region: Secondary | ICD-10-CM | POA: Diagnosis not present

## 2014-09-06 DIAGNOSIS — M9905 Segmental and somatic dysfunction of pelvic region: Secondary | ICD-10-CM | POA: Diagnosis not present

## 2014-09-06 DIAGNOSIS — M9901 Segmental and somatic dysfunction of cervical region: Secondary | ICD-10-CM | POA: Diagnosis not present

## 2014-09-07 DIAGNOSIS — M9901 Segmental and somatic dysfunction of cervical region: Secondary | ICD-10-CM | POA: Diagnosis not present

## 2014-09-07 DIAGNOSIS — M9903 Segmental and somatic dysfunction of lumbar region: Secondary | ICD-10-CM | POA: Diagnosis not present

## 2014-09-07 DIAGNOSIS — M5416 Radiculopathy, lumbar region: Secondary | ICD-10-CM | POA: Diagnosis not present

## 2014-09-07 DIAGNOSIS — M9905 Segmental and somatic dysfunction of pelvic region: Secondary | ICD-10-CM | POA: Diagnosis not present

## 2014-09-07 DIAGNOSIS — M5413 Radiculopathy, cervicothoracic region: Secondary | ICD-10-CM | POA: Diagnosis not present

## 2014-09-11 DIAGNOSIS — M9905 Segmental and somatic dysfunction of pelvic region: Secondary | ICD-10-CM | POA: Diagnosis not present

## 2014-09-11 DIAGNOSIS — M5416 Radiculopathy, lumbar region: Secondary | ICD-10-CM | POA: Diagnosis not present

## 2014-09-11 DIAGNOSIS — M9903 Segmental and somatic dysfunction of lumbar region: Secondary | ICD-10-CM | POA: Diagnosis not present

## 2014-09-11 DIAGNOSIS — M5413 Radiculopathy, cervicothoracic region: Secondary | ICD-10-CM | POA: Diagnosis not present

## 2014-09-11 DIAGNOSIS — M9901 Segmental and somatic dysfunction of cervical region: Secondary | ICD-10-CM | POA: Diagnosis not present

## 2014-09-26 DIAGNOSIS — Z853 Personal history of malignant neoplasm of breast: Secondary | ICD-10-CM | POA: Diagnosis not present

## 2014-10-15 DIAGNOSIS — M5413 Radiculopathy, cervicothoracic region: Secondary | ICD-10-CM | POA: Diagnosis not present

## 2014-10-15 DIAGNOSIS — M9905 Segmental and somatic dysfunction of pelvic region: Secondary | ICD-10-CM | POA: Diagnosis not present

## 2014-10-15 DIAGNOSIS — M9903 Segmental and somatic dysfunction of lumbar region: Secondary | ICD-10-CM | POA: Diagnosis not present

## 2014-10-15 DIAGNOSIS — M5416 Radiculopathy, lumbar region: Secondary | ICD-10-CM | POA: Diagnosis not present

## 2014-10-15 DIAGNOSIS — M9901 Segmental and somatic dysfunction of cervical region: Secondary | ICD-10-CM | POA: Diagnosis not present

## 2014-10-16 DIAGNOSIS — M9901 Segmental and somatic dysfunction of cervical region: Secondary | ICD-10-CM | POA: Diagnosis not present

## 2014-10-16 DIAGNOSIS — M5413 Radiculopathy, cervicothoracic region: Secondary | ICD-10-CM | POA: Diagnosis not present

## 2014-10-16 DIAGNOSIS — M5416 Radiculopathy, lumbar region: Secondary | ICD-10-CM | POA: Diagnosis not present

## 2014-10-16 DIAGNOSIS — M9905 Segmental and somatic dysfunction of pelvic region: Secondary | ICD-10-CM | POA: Diagnosis not present

## 2014-10-16 DIAGNOSIS — M9903 Segmental and somatic dysfunction of lumbar region: Secondary | ICD-10-CM | POA: Diagnosis not present

## 2014-10-18 DIAGNOSIS — M9905 Segmental and somatic dysfunction of pelvic region: Secondary | ICD-10-CM | POA: Diagnosis not present

## 2014-10-18 DIAGNOSIS — M5413 Radiculopathy, cervicothoracic region: Secondary | ICD-10-CM | POA: Diagnosis not present

## 2014-10-18 DIAGNOSIS — M9901 Segmental and somatic dysfunction of cervical region: Secondary | ICD-10-CM | POA: Diagnosis not present

## 2014-10-18 DIAGNOSIS — M9903 Segmental and somatic dysfunction of lumbar region: Secondary | ICD-10-CM | POA: Diagnosis not present

## 2014-10-18 DIAGNOSIS — M5416 Radiculopathy, lumbar region: Secondary | ICD-10-CM | POA: Diagnosis not present

## 2014-10-23 DIAGNOSIS — M5416 Radiculopathy, lumbar region: Secondary | ICD-10-CM | POA: Diagnosis not present

## 2014-10-23 DIAGNOSIS — M5413 Radiculopathy, cervicothoracic region: Secondary | ICD-10-CM | POA: Diagnosis not present

## 2014-10-23 DIAGNOSIS — M9905 Segmental and somatic dysfunction of pelvic region: Secondary | ICD-10-CM | POA: Diagnosis not present

## 2014-10-23 DIAGNOSIS — M9903 Segmental and somatic dysfunction of lumbar region: Secondary | ICD-10-CM | POA: Diagnosis not present

## 2014-10-23 DIAGNOSIS — M9901 Segmental and somatic dysfunction of cervical region: Secondary | ICD-10-CM | POA: Diagnosis not present

## 2014-10-25 DIAGNOSIS — M5412 Radiculopathy, cervical region: Secondary | ICD-10-CM | POA: Diagnosis not present

## 2014-10-25 DIAGNOSIS — Z0000 Encounter for general adult medical examination without abnormal findings: Secondary | ICD-10-CM | POA: Diagnosis not present

## 2014-10-25 DIAGNOSIS — Z1389 Encounter for screening for other disorder: Secondary | ICD-10-CM | POA: Diagnosis not present

## 2014-10-25 DIAGNOSIS — Z9181 History of falling: Secondary | ICD-10-CM | POA: Diagnosis not present

## 2014-11-08 DIAGNOSIS — M9903 Segmental and somatic dysfunction of lumbar region: Secondary | ICD-10-CM | POA: Diagnosis not present

## 2014-11-08 DIAGNOSIS — M9901 Segmental and somatic dysfunction of cervical region: Secondary | ICD-10-CM | POA: Diagnosis not present

## 2014-11-08 DIAGNOSIS — L578 Other skin changes due to chronic exposure to nonionizing radiation: Secondary | ICD-10-CM | POA: Diagnosis not present

## 2014-11-08 DIAGNOSIS — C44722 Squamous cell carcinoma of skin of right lower limb, including hip: Secondary | ICD-10-CM | POA: Diagnosis not present

## 2014-11-08 DIAGNOSIS — M5413 Radiculopathy, cervicothoracic region: Secondary | ICD-10-CM | POA: Diagnosis not present

## 2014-11-08 DIAGNOSIS — M5416 Radiculopathy, lumbar region: Secondary | ICD-10-CM | POA: Diagnosis not present

## 2014-11-08 DIAGNOSIS — L570 Actinic keratosis: Secondary | ICD-10-CM | POA: Diagnosis not present

## 2014-11-08 DIAGNOSIS — M9905 Segmental and somatic dysfunction of pelvic region: Secondary | ICD-10-CM | POA: Diagnosis not present

## 2014-11-15 DIAGNOSIS — M9901 Segmental and somatic dysfunction of cervical region: Secondary | ICD-10-CM | POA: Diagnosis not present

## 2014-11-15 DIAGNOSIS — M9903 Segmental and somatic dysfunction of lumbar region: Secondary | ICD-10-CM | POA: Diagnosis not present

## 2014-11-15 DIAGNOSIS — M5413 Radiculopathy, cervicothoracic region: Secondary | ICD-10-CM | POA: Diagnosis not present

## 2014-11-15 DIAGNOSIS — M5416 Radiculopathy, lumbar region: Secondary | ICD-10-CM | POA: Diagnosis not present

## 2014-11-15 DIAGNOSIS — M9905 Segmental and somatic dysfunction of pelvic region: Secondary | ICD-10-CM | POA: Diagnosis not present

## 2014-12-04 DIAGNOSIS — M5416 Radiculopathy, lumbar region: Secondary | ICD-10-CM | POA: Diagnosis not present

## 2014-12-04 DIAGNOSIS — M5413 Radiculopathy, cervicothoracic region: Secondary | ICD-10-CM | POA: Diagnosis not present

## 2014-12-04 DIAGNOSIS — M9901 Segmental and somatic dysfunction of cervical region: Secondary | ICD-10-CM | POA: Diagnosis not present

## 2014-12-04 DIAGNOSIS — M9903 Segmental and somatic dysfunction of lumbar region: Secondary | ICD-10-CM | POA: Diagnosis not present

## 2014-12-04 DIAGNOSIS — M9905 Segmental and somatic dysfunction of pelvic region: Secondary | ICD-10-CM | POA: Diagnosis not present

## 2014-12-11 DIAGNOSIS — M5413 Radiculopathy, cervicothoracic region: Secondary | ICD-10-CM | POA: Diagnosis not present

## 2014-12-11 DIAGNOSIS — M9903 Segmental and somatic dysfunction of lumbar region: Secondary | ICD-10-CM | POA: Diagnosis not present

## 2014-12-11 DIAGNOSIS — M5416 Radiculopathy, lumbar region: Secondary | ICD-10-CM | POA: Diagnosis not present

## 2014-12-11 DIAGNOSIS — M9905 Segmental and somatic dysfunction of pelvic region: Secondary | ICD-10-CM | POA: Diagnosis not present

## 2014-12-11 DIAGNOSIS — M9901 Segmental and somatic dysfunction of cervical region: Secondary | ICD-10-CM | POA: Diagnosis not present

## 2014-12-18 DIAGNOSIS — M9905 Segmental and somatic dysfunction of pelvic region: Secondary | ICD-10-CM | POA: Diagnosis not present

## 2014-12-18 DIAGNOSIS — M5416 Radiculopathy, lumbar region: Secondary | ICD-10-CM | POA: Diagnosis not present

## 2014-12-18 DIAGNOSIS — M9901 Segmental and somatic dysfunction of cervical region: Secondary | ICD-10-CM | POA: Diagnosis not present

## 2014-12-18 DIAGNOSIS — M9903 Segmental and somatic dysfunction of lumbar region: Secondary | ICD-10-CM | POA: Diagnosis not present

## 2014-12-18 DIAGNOSIS — M5413 Radiculopathy, cervicothoracic region: Secondary | ICD-10-CM | POA: Diagnosis not present

## 2014-12-27 DIAGNOSIS — Z853 Personal history of malignant neoplasm of breast: Secondary | ICD-10-CM | POA: Diagnosis not present

## 2014-12-27 DIAGNOSIS — Z79811 Long term (current) use of aromatase inhibitors: Secondary | ICD-10-CM | POA: Diagnosis not present

## 2015-01-11 DIAGNOSIS — Z1211 Encounter for screening for malignant neoplasm of colon: Secondary | ICD-10-CM | POA: Diagnosis not present

## 2015-01-11 DIAGNOSIS — Z86010 Personal history of colonic polyps: Secondary | ICD-10-CM | POA: Diagnosis not present

## 2015-01-11 DIAGNOSIS — K580 Irritable bowel syndrome with diarrhea: Secondary | ICD-10-CM | POA: Diagnosis not present

## 2015-01-17 DIAGNOSIS — Z79810 Long term (current) use of selective estrogen receptor modulators (SERMs): Secondary | ICD-10-CM | POA: Diagnosis not present

## 2015-01-17 DIAGNOSIS — C50112 Malignant neoplasm of central portion of left female breast: Secondary | ICD-10-CM | POA: Diagnosis not present

## 2015-01-24 DIAGNOSIS — L821 Other seborrheic keratosis: Secondary | ICD-10-CM | POA: Diagnosis not present

## 2015-01-24 DIAGNOSIS — Z0000 Encounter for general adult medical examination without abnormal findings: Secondary | ICD-10-CM | POA: Diagnosis not present

## 2015-01-24 DIAGNOSIS — F331 Major depressive disorder, recurrent, moderate: Secondary | ICD-10-CM | POA: Diagnosis not present

## 2015-04-17 ENCOUNTER — Other Ambulatory Visit: Payer: Self-pay

## 2015-04-17 DIAGNOSIS — K635 Polyp of colon: Secondary | ICD-10-CM | POA: Diagnosis not present

## 2015-04-17 DIAGNOSIS — D122 Benign neoplasm of ascending colon: Secondary | ICD-10-CM | POA: Diagnosis not present

## 2015-04-17 DIAGNOSIS — Z853 Personal history of malignant neoplasm of breast: Secondary | ICD-10-CM | POA: Diagnosis not present

## 2015-04-17 DIAGNOSIS — K589 Irritable bowel syndrome without diarrhea: Secondary | ICD-10-CM | POA: Diagnosis not present

## 2015-04-17 DIAGNOSIS — K648 Other hemorrhoids: Secondary | ICD-10-CM | POA: Diagnosis not present

## 2015-04-17 DIAGNOSIS — I10 Essential (primary) hypertension: Secondary | ICD-10-CM | POA: Diagnosis not present

## 2015-04-17 DIAGNOSIS — D125 Benign neoplasm of sigmoid colon: Secondary | ICD-10-CM | POA: Diagnosis not present

## 2015-04-17 DIAGNOSIS — Z86010 Personal history of colonic polyps: Secondary | ICD-10-CM | POA: Diagnosis not present

## 2015-04-17 DIAGNOSIS — K219 Gastro-esophageal reflux disease without esophagitis: Secondary | ICD-10-CM | POA: Diagnosis not present

## 2015-04-17 DIAGNOSIS — F329 Major depressive disorder, single episode, unspecified: Secondary | ICD-10-CM | POA: Diagnosis not present

## 2015-04-17 DIAGNOSIS — Z9049 Acquired absence of other specified parts of digestive tract: Secondary | ICD-10-CM | POA: Diagnosis not present

## 2015-04-17 DIAGNOSIS — Z1211 Encounter for screening for malignant neoplasm of colon: Secondary | ICD-10-CM | POA: Diagnosis not present

## 2015-04-17 DIAGNOSIS — K5730 Diverticulosis of large intestine without perforation or abscess without bleeding: Secondary | ICD-10-CM | POA: Diagnosis not present

## 2015-04-17 HISTORY — PX: COLONOSCOPY: SHX174

## 2015-04-22 DIAGNOSIS — Z853 Personal history of malignant neoplasm of breast: Secondary | ICD-10-CM | POA: Diagnosis not present

## 2015-04-22 DIAGNOSIS — Z79810 Long term (current) use of selective estrogen receptor modulators (SERMs): Secondary | ICD-10-CM | POA: Diagnosis not present

## 2015-04-25 DIAGNOSIS — M5412 Radiculopathy, cervical region: Secondary | ICD-10-CM | POA: Diagnosis not present

## 2015-04-25 DIAGNOSIS — I10 Essential (primary) hypertension: Secondary | ICD-10-CM | POA: Diagnosis not present

## 2015-04-26 DIAGNOSIS — M5412 Radiculopathy, cervical region: Secondary | ICD-10-CM | POA: Diagnosis not present

## 2015-04-26 DIAGNOSIS — I10 Essential (primary) hypertension: Secondary | ICD-10-CM | POA: Diagnosis not present

## 2015-04-26 DIAGNOSIS — C50112 Malignant neoplasm of central portion of left female breast: Secondary | ICD-10-CM | POA: Diagnosis not present

## 2015-06-06 DIAGNOSIS — M47816 Spondylosis without myelopathy or radiculopathy, lumbar region: Secondary | ICD-10-CM | POA: Diagnosis not present

## 2015-06-06 DIAGNOSIS — M5030 Other cervical disc degeneration, unspecified cervical region: Secondary | ICD-10-CM | POA: Diagnosis not present

## 2015-06-06 DIAGNOSIS — M4312 Spondylolisthesis, cervical region: Secondary | ICD-10-CM | POA: Diagnosis not present

## 2015-06-06 DIAGNOSIS — M5413 Radiculopathy, cervicothoracic region: Secondary | ICD-10-CM | POA: Diagnosis not present

## 2015-06-06 DIAGNOSIS — M5416 Radiculopathy, lumbar region: Secondary | ICD-10-CM | POA: Diagnosis not present

## 2015-06-13 DIAGNOSIS — F331 Major depressive disorder, recurrent, moderate: Secondary | ICD-10-CM | POA: Diagnosis not present

## 2015-07-04 DIAGNOSIS — M5413 Radiculopathy, cervicothoracic region: Secondary | ICD-10-CM | POA: Diagnosis not present

## 2015-07-04 DIAGNOSIS — M47816 Spondylosis without myelopathy or radiculopathy, lumbar region: Secondary | ICD-10-CM | POA: Diagnosis not present

## 2015-07-18 DIAGNOSIS — M1711 Unilateral primary osteoarthritis, right knee: Secondary | ICD-10-CM | POA: Diagnosis not present

## 2015-08-09 ENCOUNTER — Ambulatory Visit
Admission: RE | Admit: 2015-08-09 | Discharge: 2015-08-09 | Disposition: A | Discharge status: Home / Self Care | Payer: Medicare Other | Source: Ambulatory Visit | Attending: Vascular Surgery | Admitting: Vascular Surgery

## 2015-08-09 DIAGNOSIS — R922 Inconclusive mammogram: Secondary | ICD-10-CM | POA: Diagnosis not present

## 2015-08-09 DIAGNOSIS — Z853 Personal history of malignant neoplasm of breast: Secondary | ICD-10-CM

## 2015-08-15 DIAGNOSIS — Z853 Personal history of malignant neoplasm of breast: Secondary | ICD-10-CM | POA: Diagnosis not present

## 2015-08-15 DIAGNOSIS — C50911 Malignant neoplasm of unspecified site of right female breast: Secondary | ICD-10-CM | POA: Diagnosis not present

## 2015-08-15 DIAGNOSIS — Z79810 Long term (current) use of selective estrogen receptor modulators (SERMs): Secondary | ICD-10-CM | POA: Diagnosis not present

## 2015-08-15 DIAGNOSIS — Z170 Estrogen receptor positive status [ER+]: Secondary | ICD-10-CM | POA: Diagnosis not present

## 2015-08-15 DIAGNOSIS — M25561 Pain in right knee: Secondary | ICD-10-CM

## 2015-08-15 DIAGNOSIS — C50912 Malignant neoplasm of unspecified site of left female breast: Secondary | ICD-10-CM | POA: Diagnosis not present

## 2015-08-19 DIAGNOSIS — M1711 Unilateral primary osteoarthritis, right knee: Secondary | ICD-10-CM | POA: Diagnosis not present

## 2015-08-19 DIAGNOSIS — E669 Obesity, unspecified: Secondary | ICD-10-CM | POA: Diagnosis not present

## 2015-08-26 DIAGNOSIS — M1711 Unilateral primary osteoarthritis, right knee: Secondary | ICD-10-CM | POA: Diagnosis not present

## 2015-08-26 DIAGNOSIS — R262 Difficulty in walking, not elsewhere classified: Secondary | ICD-10-CM | POA: Diagnosis not present

## 2015-08-26 DIAGNOSIS — M25561 Pain in right knee: Secondary | ICD-10-CM | POA: Diagnosis not present

## 2015-08-26 DIAGNOSIS — M25562 Pain in left knee: Secondary | ICD-10-CM | POA: Diagnosis not present

## 2015-08-26 DIAGNOSIS — M170 Bilateral primary osteoarthritis of knee: Secondary | ICD-10-CM | POA: Diagnosis not present

## 2015-09-02 DIAGNOSIS — M25561 Pain in right knee: Secondary | ICD-10-CM | POA: Diagnosis not present

## 2015-09-19 DIAGNOSIS — E669 Obesity, unspecified: Secondary | ICD-10-CM | POA: Diagnosis not present

## 2015-09-19 DIAGNOSIS — M5412 Radiculopathy, cervical region: Secondary | ICD-10-CM | POA: Diagnosis not present

## 2015-09-24 DIAGNOSIS — D499 Neoplasm of unspecified behavior of unspecified site: Secondary | ICD-10-CM | POA: Insufficient documentation

## 2015-09-24 DIAGNOSIS — Z6836 Body mass index (BMI) 36.0-36.9, adult: Secondary | ICD-10-CM | POA: Diagnosis not present

## 2015-09-24 DIAGNOSIS — Z170 Estrogen receptor positive status [ER+]: Secondary | ICD-10-CM | POA: Diagnosis not present

## 2015-09-24 DIAGNOSIS — C50411 Malignant neoplasm of upper-outer quadrant of right female breast: Secondary | ICD-10-CM | POA: Diagnosis not present

## 2015-09-24 DIAGNOSIS — C50412 Malignant neoplasm of upper-outer quadrant of left female breast: Secondary | ICD-10-CM | POA: Diagnosis not present

## 2015-09-24 HISTORY — DX: Neoplasm of unspecified behavior of unspecified site: D49.9

## 2015-09-24 HISTORY — DX: Neoplasm of unspecified behavior of unspecified site: Z17.0

## 2015-10-14 DIAGNOSIS — M25561 Pain in right knee: Secondary | ICD-10-CM | POA: Diagnosis not present

## 2015-10-17 DIAGNOSIS — R002 Palpitations: Secondary | ICD-10-CM | POA: Diagnosis not present

## 2015-10-17 DIAGNOSIS — M8580 Other specified disorders of bone density and structure, unspecified site: Secondary | ICD-10-CM | POA: Diagnosis not present

## 2015-10-17 DIAGNOSIS — Z13820 Encounter for screening for osteoporosis: Secondary | ICD-10-CM | POA: Diagnosis not present

## 2015-10-17 DIAGNOSIS — Z23 Encounter for immunization: Secondary | ICD-10-CM | POA: Diagnosis not present

## 2015-10-17 DIAGNOSIS — M859 Disorder of bone density and structure, unspecified: Secondary | ICD-10-CM | POA: Diagnosis not present

## 2015-10-21 DIAGNOSIS — X58XXXA Exposure to other specified factors, initial encounter: Secondary | ICD-10-CM | POA: Diagnosis not present

## 2015-10-21 DIAGNOSIS — R0609 Other forms of dyspnea: Secondary | ICD-10-CM | POA: Diagnosis not present

## 2015-10-21 DIAGNOSIS — S83241A Other tear of medial meniscus, current injury, right knee, initial encounter: Secondary | ICD-10-CM | POA: Diagnosis not present

## 2015-10-21 DIAGNOSIS — M25561 Pain in right knee: Secondary | ICD-10-CM | POA: Diagnosis not present

## 2015-10-21 DIAGNOSIS — M25461 Effusion, right knee: Secondary | ICD-10-CM | POA: Diagnosis not present

## 2015-10-21 DIAGNOSIS — Z6836 Body mass index (BMI) 36.0-36.9, adult: Secondary | ICD-10-CM | POA: Diagnosis not present

## 2015-10-21 DIAGNOSIS — R002 Palpitations: Secondary | ICD-10-CM | POA: Diagnosis not present

## 2015-10-21 DIAGNOSIS — R600 Localized edema: Secondary | ICD-10-CM | POA: Diagnosis not present

## 2015-10-23 DIAGNOSIS — S83241A Other tear of medial meniscus, current injury, right knee, initial encounter: Secondary | ICD-10-CM | POA: Diagnosis not present

## 2015-10-24 DIAGNOSIS — H04123 Dry eye syndrome of bilateral lacrimal glands: Secondary | ICD-10-CM | POA: Diagnosis not present

## 2015-10-24 DIAGNOSIS — H00029 Hordeolum internum unspecified eye, unspecified eyelid: Secondary | ICD-10-CM | POA: Diagnosis not present

## 2015-11-05 ENCOUNTER — Other Ambulatory Visit: Payer: Self-pay | Admitting: Vascular Surgery

## 2015-11-05 DIAGNOSIS — R002 Palpitations: Secondary | ICD-10-CM | POA: Diagnosis not present

## 2015-11-05 DIAGNOSIS — R600 Localized edema: Secondary | ICD-10-CM | POA: Diagnosis not present

## 2015-11-05 DIAGNOSIS — Z9889 Other specified postprocedural states: Secondary | ICD-10-CM

## 2015-11-05 DIAGNOSIS — R0609 Other forms of dyspnea: Secondary | ICD-10-CM | POA: Diagnosis not present

## 2015-11-05 DIAGNOSIS — Z853 Personal history of malignant neoplasm of breast: Secondary | ICD-10-CM

## 2015-11-07 DIAGNOSIS — H04122 Dry eye syndrome of left lacrimal gland: Secondary | ICD-10-CM | POA: Diagnosis not present

## 2015-11-07 DIAGNOSIS — H04123 Dry eye syndrome of bilateral lacrimal glands: Secondary | ICD-10-CM | POA: Diagnosis not present

## 2015-11-07 DIAGNOSIS — H1045 Other chronic allergic conjunctivitis: Secondary | ICD-10-CM | POA: Diagnosis not present

## 2015-11-07 DIAGNOSIS — H04121 Dry eye syndrome of right lacrimal gland: Secondary | ICD-10-CM | POA: Diagnosis not present

## 2015-11-14 DIAGNOSIS — L570 Actinic keratosis: Secondary | ICD-10-CM | POA: Diagnosis not present

## 2015-11-14 DIAGNOSIS — L821 Other seborrheic keratosis: Secondary | ICD-10-CM | POA: Diagnosis not present

## 2015-11-14 DIAGNOSIS — L719 Rosacea, unspecified: Secondary | ICD-10-CM | POA: Diagnosis not present

## 2015-11-14 DIAGNOSIS — I781 Nevus, non-neoplastic: Secondary | ICD-10-CM | POA: Diagnosis not present

## 2015-11-14 DIAGNOSIS — C44722 Squamous cell carcinoma of skin of right lower limb, including hip: Secondary | ICD-10-CM | POA: Diagnosis not present

## 2016-02-05 DIAGNOSIS — S83241A Other tear of medial meniscus, current injury, right knee, initial encounter: Secondary | ICD-10-CM | POA: Diagnosis not present

## 2016-02-12 DIAGNOSIS — Z853 Personal history of malignant neoplasm of breast: Secondary | ICD-10-CM | POA: Diagnosis not present

## 2016-02-12 DIAGNOSIS — Z79810 Long term (current) use of selective estrogen receptor modulators (SERMs): Secondary | ICD-10-CM | POA: Diagnosis not present

## 2016-02-14 DIAGNOSIS — G2581 Restless legs syndrome: Secondary | ICD-10-CM | POA: Diagnosis not present

## 2016-02-14 DIAGNOSIS — K219 Gastro-esophageal reflux disease without esophagitis: Secondary | ICD-10-CM | POA: Diagnosis not present

## 2016-02-14 DIAGNOSIS — M1711 Unilateral primary osteoarthritis, right knee: Secondary | ICD-10-CM | POA: Diagnosis not present

## 2016-02-14 DIAGNOSIS — M65861 Other synovitis and tenosynovitis, right lower leg: Secondary | ICD-10-CM | POA: Diagnosis not present

## 2016-02-14 DIAGNOSIS — M6751 Plica syndrome, right knee: Secondary | ICD-10-CM | POA: Diagnosis not present

## 2016-02-14 DIAGNOSIS — Z79899 Other long term (current) drug therapy: Secondary | ICD-10-CM | POA: Diagnosis not present

## 2016-02-14 DIAGNOSIS — F329 Major depressive disorder, single episode, unspecified: Secondary | ICD-10-CM | POA: Diagnosis not present

## 2016-02-14 DIAGNOSIS — Z853 Personal history of malignant neoplasm of breast: Secondary | ICD-10-CM | POA: Diagnosis not present

## 2016-02-14 DIAGNOSIS — S83241A Other tear of medial meniscus, current injury, right knee, initial encounter: Secondary | ICD-10-CM | POA: Diagnosis not present

## 2016-02-14 DIAGNOSIS — Z87891 Personal history of nicotine dependence: Secondary | ICD-10-CM | POA: Diagnosis not present

## 2016-02-14 DIAGNOSIS — I10 Essential (primary) hypertension: Secondary | ICD-10-CM | POA: Diagnosis not present

## 2016-02-17 DIAGNOSIS — M25561 Pain in right knee: Secondary | ICD-10-CM | POA: Diagnosis not present

## 2016-02-17 DIAGNOSIS — R2689 Other abnormalities of gait and mobility: Secondary | ICD-10-CM | POA: Diagnosis not present

## 2016-02-17 DIAGNOSIS — S83241D Other tear of medial meniscus, current injury, right knee, subsequent encounter: Secondary | ICD-10-CM | POA: Diagnosis not present

## 2016-02-24 DIAGNOSIS — M25561 Pain in right knee: Secondary | ICD-10-CM | POA: Diagnosis not present

## 2016-02-24 DIAGNOSIS — S83241D Other tear of medial meniscus, current injury, right knee, subsequent encounter: Secondary | ICD-10-CM | POA: Diagnosis not present

## 2016-02-24 DIAGNOSIS — R2689 Other abnormalities of gait and mobility: Secondary | ICD-10-CM | POA: Diagnosis not present

## 2016-02-27 DIAGNOSIS — M25561 Pain in right knee: Secondary | ICD-10-CM | POA: Diagnosis not present

## 2016-02-27 DIAGNOSIS — R2689 Other abnormalities of gait and mobility: Secondary | ICD-10-CM | POA: Diagnosis not present

## 2016-02-27 DIAGNOSIS — S83241D Other tear of medial meniscus, current injury, right knee, subsequent encounter: Secondary | ICD-10-CM | POA: Diagnosis not present

## 2016-03-16 DIAGNOSIS — A088 Other specified intestinal infections: Secondary | ICD-10-CM | POA: Diagnosis not present

## 2016-05-29 DIAGNOSIS — G629 Polyneuropathy, unspecified: Secondary | ICD-10-CM | POA: Diagnosis not present

## 2016-05-29 DIAGNOSIS — Z79899 Other long term (current) drug therapy: Secondary | ICD-10-CM | POA: Diagnosis not present

## 2016-05-29 DIAGNOSIS — Z0000 Encounter for general adult medical examination without abnormal findings: Secondary | ICD-10-CM | POA: Diagnosis not present

## 2016-05-29 DIAGNOSIS — I10 Essential (primary) hypertension: Secondary | ICD-10-CM | POA: Diagnosis not present

## 2016-05-29 DIAGNOSIS — R739 Hyperglycemia, unspecified: Secondary | ICD-10-CM | POA: Diagnosis not present

## 2016-05-29 DIAGNOSIS — I8393 Asymptomatic varicose veins of bilateral lower extremities: Secondary | ICD-10-CM | POA: Diagnosis not present

## 2016-06-11 ENCOUNTER — Other Ambulatory Visit: Payer: Self-pay | Admitting: Vascular Surgery

## 2016-06-11 DIAGNOSIS — I83813 Varicose veins of bilateral lower extremities with pain: Secondary | ICD-10-CM

## 2016-06-25 DIAGNOSIS — H04129 Dry eye syndrome of unspecified lacrimal gland: Secondary | ICD-10-CM | POA: Diagnosis not present

## 2016-06-25 DIAGNOSIS — H04123 Dry eye syndrome of bilateral lacrimal glands: Secondary | ICD-10-CM

## 2016-06-25 DIAGNOSIS — Z961 Presence of intraocular lens: Secondary | ICD-10-CM | POA: Diagnosis not present

## 2016-06-25 DIAGNOSIS — H3530 Unspecified macular degeneration: Secondary | ICD-10-CM | POA: Insufficient documentation

## 2016-06-25 HISTORY — DX: Dry eye syndrome of bilateral lacrimal glands: H04.123

## 2016-07-29 ENCOUNTER — Encounter: Payer: Self-pay | Admitting: Vascular Surgery

## 2016-08-10 ENCOUNTER — Ambulatory Visit
Admission: RE | Admit: 2016-08-10 | Discharge: 2016-08-10 | Disposition: A | Discharge status: Home / Self Care | Payer: Medicare Other | Source: Ambulatory Visit | Attending: Vascular Surgery | Admitting: Vascular Surgery

## 2016-08-10 DIAGNOSIS — Z853 Personal history of malignant neoplasm of breast: Secondary | ICD-10-CM

## 2016-08-10 DIAGNOSIS — Z9889 Other specified postprocedural states: Secondary | ICD-10-CM

## 2016-08-10 DIAGNOSIS — R928 Other abnormal and inconclusive findings on diagnostic imaging of breast: Secondary | ICD-10-CM | POA: Diagnosis not present

## 2016-08-10 HISTORY — DX: Personal history of antineoplastic chemotherapy: Z92.21

## 2016-08-10 HISTORY — DX: Personal history of irradiation: Z92.3

## 2016-08-10 HISTORY — DX: Malignant neoplasm of unspecified site of unspecified female breast: C50.919

## 2016-08-10 HISTORY — DX: Malignant (primary) neoplasm, unspecified: C80.1

## 2016-08-11 ENCOUNTER — Encounter: Payer: Self-pay | Admitting: Vascular Surgery

## 2016-08-11 ENCOUNTER — Ambulatory Visit (INDEPENDENT_AMBULATORY_CARE_PROVIDER_SITE_OTHER): Payer: Medicare Other | Admitting: Vascular Surgery

## 2016-08-11 ENCOUNTER — Ambulatory Visit (HOSPITAL_COMMUNITY)
Admission: RE | Admit: 2016-08-11 | Discharge: 2016-08-11 | Disposition: A | Discharge status: Home / Self Care | Payer: Medicare Other | Source: Ambulatory Visit | Attending: Vascular Surgery | Admitting: Vascular Surgery

## 2016-08-11 VITALS — BP 125/77 | HR 87 | Temp 97.7°F | Resp 18 | Ht 64.75 in | Wt 226.8 lb

## 2016-08-11 DIAGNOSIS — I83813 Varicose veins of bilateral lower extremities with pain: Secondary | ICD-10-CM | POA: Insufficient documentation

## 2016-08-11 DIAGNOSIS — I83893 Varicose veins of bilateral lower extremities with other complications: Secondary | ICD-10-CM

## 2016-08-11 DIAGNOSIS — I83892 Varicose veins of left lower extremities with other complications: Secondary | ICD-10-CM | POA: Insufficient documentation

## 2016-08-11 HISTORY — DX: Varicose veins of left lower extremity with other complications: I83.892

## 2016-08-11 NOTE — Progress Notes (Signed)
Subjective:     Patient ID: Heather Chavez, female   DOB: 11/24/1945, 71 y.o.   MRN: [id number]  HPI This 71 year old female was referred Dr. Helene Kelp for evaluation of bilateral lower extremity varicose veins. Patient has a history of a left vein stripping in the past. She states that they did not do any stripping procedure in the upper area of her left leg however. She has no history of DVT thrombophlebitis stasis ulcers or bleeding. She has noted significant swelling has worsened over the past several months in both lower extremities. She develops numbness in the right foot from neuropathy. She is unable to wear elastic compression stockings short leg because of recent knee surgery and an increase in swelling around the knee area if she wears short leg stockings. She has noticed darkening of the skin and lower third of the left leg recently.  Past Medical History:  Diagnosis Date  . Breast cancer ([place])   . Cancer ([place])   . Personal history of chemotherapy   . Personal history of radiation therapy     Social History  Substance Use Topics  . Smoking status: Former Smoker    Quit date: 03/09/1981  . Smokeless tobacco: Never Used  . Alcohol use Yes    Family History  Problem Relation Age of Onset  . Cancer Mother 31       bilateral breast cancer ages 42 then 38. Peritoneal cancer at age 44.  . Other Mother        [name]'s mother was adopted so have no information regarding maternal family  history other than her mother  . Breast cancer Mother   . Cancer Father 62       renal  . Cancer Sister 24       breast  . Breast cancer Sister   . Cancer Paternal Aunt 71       breast  . Cancer Paternal Grandmother        bilateral breast cancer at unknown age    Allergies  Allergen Reactions  . Tylenol [Acetaminophen]      Current Outpatient Prescriptions:  .  fluticasone (FLONASE) 50 MCG/ACT nasal spray, fluticasone 50 mcg/actuation nasal spray,suspension, Disp: , Rfl:  .   gabapentin (NEURONTIN) 100 MG capsule, 400 mg daily., Disp: , Rfl:  .  meloxicam (MOBIC) 15 MG tablet, , Disp: , Rfl: 0 .  Olopatadine HCl (PAZEO) 0.7 % SOLN, Pazeo 0.7 % eye drops  INSTILL 1 DROP INTO AFFECTED EYE(S) BY OPHTHALMIC ROUTE ONCE DAILY, Disp: , Rfl:  .  omeprazole (PRILOSEC) 20 MG capsule, Take 20 mg by mouth., Disp: , Rfl:  .  tamoxifen (NOLVADEX) 20 MG tablet, tamoxifen 20 mg tablet  TK 1 T PO D, Disp: , Rfl:  .  valsartan-hydrochlorothiazide (DIOVAN-HCT) 160-25 MG tablet, valsartan 160 mg-hydrochlorothiazide 25 mg tablet  TK 1 T PO QD, Disp: , Rfl:   Vitals:   08/11/16 1136  BP: 125/77  Pulse: 87  Resp: 18  Temp: 97.7 F (36.5 C)  TempSrc: Oral  SpO2: 100%  Weight: 226 lb 12.8 oz (102.9 kg)  Height: 5' 4.75" (1.645 m)    Body mass index is 38.03 kg/m.         Review of Systems History of breast cancer with radiation and chemotherapy, denies chest pain but does have mild dyspnea on exertion. States she is able to ambulate 1 mile but has not done so recently.    Objective:   Physical Exam BP  125/77 (BP Location: Left Arm, Patient Position: Sitting, Cuff Size: Large)   Pulse 87   Temp 97.7 F (36.5 C) (Oral)   Resp 18   Ht 5' 4.75" (1.645 m)   Wt 226 lb 12.8 oz (102.9 kg)   SpO2 100%   BMI 38.03 kg/m     Gen.-alert and oriented x3 in no apparent distress-obese HEENT normal for age Lungs no rhonchi or wheezing Cardiovascular regular rhythm no murmurs carotid pulses 3+ palpable no bruits audible Abdomen soft nontender no palpable masses-obese  Musculoskeletal free of  major deformities Skin clear -no rashes Neurologic normal Lower extremities 3+ femoral and dorsalis pedis pulses palpable bilaterally with 1+ edema bilaterally Left leg with hyperpigmentation lower third with thickening of the skin but no active ulceration. Bulging varicosities medial left thigh and medial left calf. Right leg with no bulging varicosities but diffuse spider veins in  the thigh and calf areas anteriorly and posteriorly.  Today or venous duplex exam chart reviewed and interpreted. There is no DVT. There is deep vein reflux on the right. The superficial system on the right has mild reflux but is small caliber Left leg has a large great saphenous vein from the saphenofemoral junction to the distal thigh supplying these painful varicosities with gross reflux throughout and some deep vein reflux is well      Assessment:     #1 painful varicosities left leg with swelling due to gross reflux left great saphenous vein-previous history of stripping of distal saphenous vein but not in the thigh area--CEAP3 #2 worsening edema right leg due to gross reflux deep venous system as well as some reflux and superficial system but small caliber vein    Plan:     #1 patient would benefit from laser ablation left great saphenous vein with stab phlebectomy of residual painful varicosities because of worsening skin changes on the left and pain and swelling She will return in 3 months for further evaluation regarding this after using long leg elastic compression stockings 20-30 millimeter gradient, elevation, and ibuprofen #2 worsening edema and right leg will need to be treated with elevation and long leg elastic compression stockings. Patient was instructed regarding this Return in 3 months

## 2016-08-18 DIAGNOSIS — C50912 Malignant neoplasm of unspecified site of left female breast: Secondary | ICD-10-CM | POA: Diagnosis not present

## 2016-08-18 DIAGNOSIS — Z170 Estrogen receptor positive status [ER+]: Secondary | ICD-10-CM | POA: Diagnosis not present

## 2016-08-18 DIAGNOSIS — Z923 Personal history of irradiation: Secondary | ICD-10-CM | POA: Diagnosis not present

## 2016-08-18 DIAGNOSIS — Z9221 Personal history of antineoplastic chemotherapy: Secondary | ICD-10-CM | POA: Diagnosis not present

## 2016-08-18 DIAGNOSIS — Z853 Personal history of malignant neoplasm of breast: Secondary | ICD-10-CM | POA: Diagnosis not present

## 2016-08-18 DIAGNOSIS — Z79810 Long term (current) use of selective estrogen receptor modulators (SERMs): Secondary | ICD-10-CM | POA: Diagnosis not present

## 2016-08-19 DIAGNOSIS — H04129 Dry eye syndrome of unspecified lacrimal gland: Secondary | ICD-10-CM | POA: Diagnosis not present

## 2016-09-01 DIAGNOSIS — Z6836 Body mass index (BMI) 36.0-36.9, adult: Secondary | ICD-10-CM | POA: Diagnosis not present

## 2016-09-01 DIAGNOSIS — Z170 Estrogen receptor positive status [ER+]: Secondary | ICD-10-CM | POA: Diagnosis not present

## 2016-09-01 DIAGNOSIS — C50212 Malignant neoplasm of upper-inner quadrant of left female breast: Secondary | ICD-10-CM | POA: Insufficient documentation

## 2016-09-01 HISTORY — DX: Malignant neoplasm of upper-inner quadrant of left female breast: C50.212

## 2016-09-17 DIAGNOSIS — H04129 Dry eye syndrome of unspecified lacrimal gland: Secondary | ICD-10-CM | POA: Diagnosis not present

## 2016-10-08 DIAGNOSIS — H209 Unspecified iridocyclitis: Secondary | ICD-10-CM | POA: Diagnosis not present

## 2016-10-08 DIAGNOSIS — H0289 Other specified disorders of eyelid: Secondary | ICD-10-CM | POA: Diagnosis not present

## 2016-11-03 DIAGNOSIS — H18519 Endothelial corneal dystrophy, unspecified eye: Secondary | ICD-10-CM | POA: Insufficient documentation

## 2016-11-03 DIAGNOSIS — H209 Unspecified iridocyclitis: Secondary | ICD-10-CM

## 2016-11-03 DIAGNOSIS — H3530 Unspecified macular degeneration: Secondary | ICD-10-CM | POA: Diagnosis not present

## 2016-11-03 DIAGNOSIS — H1851 Endothelial corneal dystrophy: Secondary | ICD-10-CM | POA: Diagnosis not present

## 2016-11-03 DIAGNOSIS — H04129 Dry eye syndrome of unspecified lacrimal gland: Secondary | ICD-10-CM | POA: Diagnosis not present

## 2016-11-03 HISTORY — DX: Endothelial corneal dystrophy, unspecified eye: H18.519

## 2016-11-03 HISTORY — DX: Unspecified iridocyclitis: H20.9

## 2016-11-04 ENCOUNTER — Encounter: Payer: Self-pay | Admitting: Vascular Surgery

## 2016-11-16 ENCOUNTER — Encounter: Payer: Self-pay | Admitting: Vascular Surgery

## 2016-11-16 ENCOUNTER — Ambulatory Visit: Payer: Medicare Other | Admitting: Vascular Surgery

## 2016-11-17 ENCOUNTER — Ambulatory Visit (INDEPENDENT_AMBULATORY_CARE_PROVIDER_SITE_OTHER): Payer: Medicare Other | Admitting: Vascular Surgery

## 2016-11-17 ENCOUNTER — Encounter: Payer: Self-pay | Admitting: Vascular Surgery

## 2016-11-17 VITALS — BP 130/79 | HR 92 | Temp 98.5°F | Resp 18 | Ht 65.0 in | Wt 219.0 lb

## 2016-11-17 DIAGNOSIS — I83892 Varicose veins of left lower extremities with other complications: Secondary | ICD-10-CM | POA: Diagnosis not present

## 2016-11-17 NOTE — Progress Notes (Signed)
Subjective:     Patient ID: Heather Chavez, female   DOB: [birth year], 71 y.o.   MRN: [id number]  HPI This 71 year old female returns for 3 month follow-up regarding her painful varicosities in the left leg. She has aching throbbing and burning discomfort in the thigh and calf and develops swelling is a day progresses. She has tried long leg elastic compression stockings 20-30 millimeter gradient as well as elevation and ibuprofen with no improvement in her symptomatology. This is affecting her daily living and she wants treatment if possible. She has no history of DVT.  Past Medical History:  Diagnosis Date  . Breast cancer ([place])   . Cancer ([place])   . Personal history of chemotherapy   . Personal history of radiation therapy     Social History  Substance Use Topics  . Smoking status: Former Smoker    Quit date: 03/09/1981  . Smokeless tobacco: Never Used  . Alcohol use Yes    Family History  Problem Relation Age of Onset  . Cancer Mother 54       bilateral breast cancer ages 33 then 45. Peritoneal cancer at age 68.  . Other Mother        Ms. Elbert's mother was adopted so have no information regarding maternal family  history other than her mother  . Breast cancer Mother   . Cancer Father 69       renal  . Cancer Sister 56       breast  . Breast cancer Sister   . Cancer Paternal Aunt 52       breast  . Cancer Paternal Grandmother        bilateral breast cancer at unknown age    Allergies  Allergen Reactions  . Tylenol [Acetaminophen]      Current Outpatient Prescriptions:  .  fluticasone (FLONASE) 50 MCG/ACT nasal spray, fluticasone 50 mcg/actuation nasal spray,suspension, Disp: , Rfl:  .  gabapentin (NEURONTIN) 100 MG capsule, 400 mg daily., Disp: , Rfl:  .  meloxicam (MOBIC) 15 MG tablet, , Disp: , Rfl: 0 .  Olopatadine HCl (PAZEO) 0.7 % SOLN, Pazeo 0.7 % eye drops  INSTILL 1 DROP INTO AFFECTED EYE(S) BY OPHTHALMIC ROUTE ONCE DAILY, Disp: , Rfl:  .  omeprazole  (PRILOSEC) 20 MG capsule, Take 20 mg by mouth., Disp: , Rfl:  .  tamoxifen (NOLVADEX) 20 MG tablet, tamoxifen 20 mg tablet  TK 1 T PO D, Disp: , Rfl:  .  valsartan-hydrochlorothiazide (DIOVAN-HCT) 160-25 MG tablet, valsartan 160 mg-hydrochlorothiazide 25 mg tablet  TK 1 T PO QD, Disp: , Rfl:   There were no vitals filed for this visit.  There is no height or weight on file to calculate BMI.         Review of Systems Has history of bilateral breast cancer with chemotherapy. Denies chest pain, dyspnea on exertion, PND, orthopnea.    Objective:   Physical Exam There were no vitals taken for this visit.  Gen. well-developed well-nourished female no apparent distress alert and oriented 3  Lungs no rhonchi or wheezing Left leg with bulging varicosities in the medial distal thigh medial calf extending anteriorly and posteriorly. Hyperpigmentation lower third left leg with lipo-dermato- sclerosis but no active ulceration. 1+ edema.     Assessment:     Painful varicosities left leg with swelling and significant skin changes distally causing symptoms which are affecting patient's daily living and resistant to conservative measures including long-leg elastic compression stockings 20-30 millimeter gradient,  elevation, and ibuprofen-CEA P4    Plan:     Patient needs laser ablation left great saphenous vein followed by three-month waiting. And then possible stab multiple stab phlebectomy and sclerotherapy We'll proceed with precertification to perform this in the near future

## 2016-11-23 ENCOUNTER — Other Ambulatory Visit: Payer: Self-pay | Admitting: *Deleted

## 2016-11-23 DIAGNOSIS — I83892 Varicose veins of left lower extremities with other complications: Secondary | ICD-10-CM

## 2016-12-02 DIAGNOSIS — D1801 Hemangioma of skin and subcutaneous tissue: Secondary | ICD-10-CM | POA: Diagnosis not present

## 2016-12-02 DIAGNOSIS — L570 Actinic keratosis: Secondary | ICD-10-CM | POA: Diagnosis not present

## 2016-12-02 DIAGNOSIS — L821 Other seborrheic keratosis: Secondary | ICD-10-CM | POA: Diagnosis not present

## 2016-12-02 DIAGNOSIS — B353 Tinea pedis: Secondary | ICD-10-CM | POA: Diagnosis not present

## 2016-12-02 DIAGNOSIS — L578 Other skin changes due to chronic exposure to nonionizing radiation: Secondary | ICD-10-CM | POA: Diagnosis not present

## 2016-12-08 ENCOUNTER — Other Ambulatory Visit: Payer: Medicare Other | Admitting: Vascular Surgery

## 2016-12-22 ENCOUNTER — Encounter (HOSPITAL_COMMUNITY): Payer: Medicare Other

## 2016-12-22 ENCOUNTER — Ambulatory Visit: Payer: Medicare Other | Admitting: Vascular Surgery

## 2017-01-12 DIAGNOSIS — H04129 Dry eye syndrome of unspecified lacrimal gland: Secondary | ICD-10-CM | POA: Diagnosis not present

## 2017-01-12 DIAGNOSIS — H1851 Endothelial corneal dystrophy: Secondary | ICD-10-CM | POA: Diagnosis not present

## 2017-02-17 ENCOUNTER — Other Ambulatory Visit: Payer: Self-pay | Admitting: Vascular Surgery

## 2017-02-17 DIAGNOSIS — Z1231 Encounter for screening mammogram for malignant neoplasm of breast: Secondary | ICD-10-CM

## 2017-02-24 DIAGNOSIS — H04129 Dry eye syndrome of unspecified lacrimal gland: Secondary | ICD-10-CM | POA: Diagnosis not present

## 2017-02-24 DIAGNOSIS — H1013 Acute atopic conjunctivitis, bilateral: Secondary | ICD-10-CM | POA: Diagnosis not present

## 2017-03-11 DIAGNOSIS — L02416 Cutaneous abscess of left lower limb: Secondary | ICD-10-CM | POA: Diagnosis not present

## 2017-03-16 DIAGNOSIS — C50912 Malignant neoplasm of unspecified site of left female breast: Secondary | ICD-10-CM | POA: Diagnosis not present

## 2017-03-16 DIAGNOSIS — Z170 Estrogen receptor positive status [ER+]: Secondary | ICD-10-CM | POA: Diagnosis not present

## 2017-03-16 DIAGNOSIS — N6489 Other specified disorders of breast: Secondary | ICD-10-CM | POA: Diagnosis not present

## 2017-03-16 DIAGNOSIS — Z79810 Long term (current) use of selective estrogen receptor modulators (SERMs): Secondary | ICD-10-CM | POA: Diagnosis not present

## 2017-03-16 DIAGNOSIS — Z923 Personal history of irradiation: Secondary | ICD-10-CM | POA: Diagnosis not present

## 2017-03-16 DIAGNOSIS — Z9221 Personal history of antineoplastic chemotherapy: Secondary | ICD-10-CM | POA: Diagnosis not present

## 2017-03-16 DIAGNOSIS — N63 Unspecified lump in unspecified breast: Secondary | ICD-10-CM | POA: Diagnosis not present

## 2017-03-16 DIAGNOSIS — Z853 Personal history of malignant neoplasm of breast: Secondary | ICD-10-CM | POA: Diagnosis not present

## 2017-03-18 ENCOUNTER — Other Ambulatory Visit: Payer: Self-pay | Admitting: Oncology

## 2017-03-18 ENCOUNTER — Other Ambulatory Visit: Payer: Self-pay | Admitting: Vascular Surgery

## 2017-03-18 DIAGNOSIS — R234 Changes in skin texture: Secondary | ICD-10-CM

## 2017-03-18 DIAGNOSIS — Z853 Personal history of malignant neoplasm of breast: Secondary | ICD-10-CM

## 2017-03-24 ENCOUNTER — Ambulatory Visit
Admission: RE | Admit: 2017-03-24 | Discharge: 2017-03-24 | Disposition: A | Discharge status: Home / Self Care | Payer: Medicare Other | Source: Ambulatory Visit | Attending: Oncology | Admitting: Oncology

## 2017-03-24 DIAGNOSIS — N6489 Other specified disorders of breast: Secondary | ICD-10-CM | POA: Diagnosis not present

## 2017-03-24 DIAGNOSIS — R234 Changes in skin texture: Secondary | ICD-10-CM

## 2017-03-24 DIAGNOSIS — R922 Inconclusive mammogram: Secondary | ICD-10-CM | POA: Diagnosis not present

## 2017-03-28 DIAGNOSIS — J111 Influenza due to unidentified influenza virus with other respiratory manifestations: Secondary | ICD-10-CM | POA: Diagnosis not present

## 2017-05-06 DIAGNOSIS — Z79899 Other long term (current) drug therapy: Secondary | ICD-10-CM | POA: Diagnosis not present

## 2017-05-06 DIAGNOSIS — K219 Gastro-esophageal reflux disease without esophagitis: Secondary | ICD-10-CM | POA: Diagnosis not present

## 2017-05-06 DIAGNOSIS — F331 Major depressive disorder, recurrent, moderate: Secondary | ICD-10-CM | POA: Diagnosis not present

## 2017-05-06 DIAGNOSIS — M1711 Unilateral primary osteoarthritis, right knee: Secondary | ICD-10-CM | POA: Diagnosis not present

## 2017-05-06 DIAGNOSIS — I10 Essential (primary) hypertension: Secondary | ICD-10-CM | POA: Diagnosis not present

## 2017-05-07 DIAGNOSIS — Z23 Encounter for immunization: Secondary | ICD-10-CM | POA: Diagnosis not present

## 2017-08-11 ENCOUNTER — Ambulatory Visit
Admission: RE | Admit: 2017-08-11 | Discharge: 2017-08-11 | Disposition: A | Discharge status: Home / Self Care | Payer: Medicare Other | Source: Ambulatory Visit | Attending: Vascular Surgery | Admitting: Vascular Surgery

## 2017-08-11 ENCOUNTER — Ambulatory Visit: Payer: Medicare Other

## 2017-08-11 DIAGNOSIS — Z853 Personal history of malignant neoplasm of breast: Secondary | ICD-10-CM

## 2017-08-11 DIAGNOSIS — R922 Inconclusive mammogram: Secondary | ICD-10-CM | POA: Diagnosis not present

## 2017-08-15 DIAGNOSIS — H1033 Unspecified acute conjunctivitis, bilateral: Secondary | ICD-10-CM | POA: Diagnosis not present

## 2017-08-17 DIAGNOSIS — Z6836 Body mass index (BMI) 36.0-36.9, adult: Secondary | ICD-10-CM | POA: Diagnosis not present

## 2017-08-17 DIAGNOSIS — C50212 Malignant neoplasm of upper-inner quadrant of left female breast: Secondary | ICD-10-CM | POA: Diagnosis not present

## 2017-08-17 DIAGNOSIS — Z170 Estrogen receptor positive status [ER+]: Secondary | ICD-10-CM | POA: Diagnosis not present

## 2017-08-18 DIAGNOSIS — Z79810 Long term (current) use of selective estrogen receptor modulators (SERMs): Secondary | ICD-10-CM | POA: Diagnosis not present

## 2017-08-18 DIAGNOSIS — Z853 Personal history of malignant neoplasm of breast: Secondary | ICD-10-CM | POA: Diagnosis not present

## 2017-10-08 ENCOUNTER — Other Ambulatory Visit: Payer: Self-pay

## 2017-12-13 DIAGNOSIS — L570 Actinic keratosis: Secondary | ICD-10-CM | POA: Diagnosis not present

## 2017-12-13 DIAGNOSIS — C44311 Basal cell carcinoma of skin of nose: Secondary | ICD-10-CM | POA: Diagnosis not present

## 2017-12-13 DIAGNOSIS — C44519 Basal cell carcinoma of skin of other part of trunk: Secondary | ICD-10-CM | POA: Diagnosis not present

## 2017-12-13 DIAGNOSIS — C44719 Basal cell carcinoma of skin of left lower limb, including hip: Secondary | ICD-10-CM | POA: Diagnosis not present

## 2017-12-13 DIAGNOSIS — L578 Other skin changes due to chronic exposure to nonionizing radiation: Secondary | ICD-10-CM | POA: Diagnosis not present

## 2017-12-21 DIAGNOSIS — C44519 Basal cell carcinoma of skin of other part of trunk: Secondary | ICD-10-CM | POA: Diagnosis not present

## 2017-12-27 DIAGNOSIS — Z23 Encounter for immunization: Secondary | ICD-10-CM | POA: Diagnosis not present

## 2018-01-12 DIAGNOSIS — J329 Chronic sinusitis, unspecified: Secondary | ICD-10-CM | POA: Diagnosis not present

## 2018-01-12 DIAGNOSIS — Z6837 Body mass index (BMI) 37.0-37.9, adult: Secondary | ICD-10-CM | POA: Diagnosis not present

## 2018-01-12 DIAGNOSIS — J40 Bronchitis, not specified as acute or chronic: Secondary | ICD-10-CM | POA: Diagnosis not present

## 2018-01-26 ENCOUNTER — Other Ambulatory Visit: Payer: Self-pay

## 2018-03-15 DIAGNOSIS — M25571 Pain in right ankle and joints of right foot: Secondary | ICD-10-CM | POA: Diagnosis not present

## 2018-03-15 DIAGNOSIS — M6528 Calcific tendinitis, other site: Secondary | ICD-10-CM | POA: Diagnosis not present

## 2018-03-16 DIAGNOSIS — M25571 Pain in right ankle and joints of right foot: Secondary | ICD-10-CM | POA: Diagnosis not present

## 2018-03-16 DIAGNOSIS — M6281 Muscle weakness (generalized): Secondary | ICD-10-CM | POA: Diagnosis not present

## 2018-03-21 DIAGNOSIS — M25571 Pain in right ankle and joints of right foot: Secondary | ICD-10-CM | POA: Diagnosis not present

## 2018-03-21 DIAGNOSIS — M6281 Muscle weakness (generalized): Secondary | ICD-10-CM | POA: Diagnosis not present

## 2018-03-24 DIAGNOSIS — M6281 Muscle weakness (generalized): Secondary | ICD-10-CM | POA: Diagnosis not present

## 2018-03-24 DIAGNOSIS — M25571 Pain in right ankle and joints of right foot: Secondary | ICD-10-CM | POA: Diagnosis not present

## 2018-03-28 DIAGNOSIS — M6281 Muscle weakness (generalized): Secondary | ICD-10-CM | POA: Diagnosis not present

## 2018-03-28 DIAGNOSIS — M25571 Pain in right ankle and joints of right foot: Secondary | ICD-10-CM | POA: Diagnosis not present

## 2018-03-29 DIAGNOSIS — H353122 Nonexudative age-related macular degeneration, left eye, intermediate dry stage: Secondary | ICD-10-CM | POA: Diagnosis not present

## 2018-03-29 DIAGNOSIS — R0683 Snoring: Secondary | ICD-10-CM | POA: Diagnosis not present

## 2018-03-29 DIAGNOSIS — H353113 Nonexudative age-related macular degeneration, right eye, advanced atrophic without subfoveal involvement: Secondary | ICD-10-CM | POA: Diagnosis not present

## 2018-03-29 DIAGNOSIS — Z961 Presence of intraocular lens: Secondary | ICD-10-CM | POA: Diagnosis not present

## 2018-03-31 DIAGNOSIS — M6281 Muscle weakness (generalized): Secondary | ICD-10-CM | POA: Diagnosis not present

## 2018-03-31 DIAGNOSIS — M25571 Pain in right ankle and joints of right foot: Secondary | ICD-10-CM | POA: Diagnosis not present

## 2018-04-01 DIAGNOSIS — J069 Acute upper respiratory infection, unspecified: Secondary | ICD-10-CM | POA: Diagnosis not present

## 2018-04-05 DIAGNOSIS — L820 Inflamed seborrheic keratosis: Secondary | ICD-10-CM | POA: Diagnosis not present

## 2018-04-05 DIAGNOSIS — L570 Actinic keratosis: Secondary | ICD-10-CM | POA: Diagnosis not present

## 2018-04-05 DIAGNOSIS — C44519 Basal cell carcinoma of skin of other part of trunk: Secondary | ICD-10-CM | POA: Diagnosis not present

## 2018-04-05 DIAGNOSIS — M6281 Muscle weakness (generalized): Secondary | ICD-10-CM | POA: Diagnosis not present

## 2018-04-05 DIAGNOSIS — M25571 Pain in right ankle and joints of right foot: Secondary | ICD-10-CM | POA: Diagnosis not present

## 2018-04-08 DIAGNOSIS — M6281 Muscle weakness (generalized): Secondary | ICD-10-CM | POA: Diagnosis not present

## 2018-04-08 DIAGNOSIS — M25571 Pain in right ankle and joints of right foot: Secondary | ICD-10-CM | POA: Diagnosis not present

## 2018-04-13 DIAGNOSIS — M6281 Muscle weakness (generalized): Secondary | ICD-10-CM | POA: Diagnosis not present

## 2018-04-13 DIAGNOSIS — M25571 Pain in right ankle and joints of right foot: Secondary | ICD-10-CM | POA: Diagnosis not present

## 2018-04-18 DIAGNOSIS — M6281 Muscle weakness (generalized): Secondary | ICD-10-CM | POA: Diagnosis not present

## 2018-04-18 DIAGNOSIS — M25571 Pain in right ankle and joints of right foot: Secondary | ICD-10-CM | POA: Diagnosis not present

## 2018-04-22 DIAGNOSIS — M25571 Pain in right ankle and joints of right foot: Secondary | ICD-10-CM | POA: Diagnosis not present

## 2018-04-22 DIAGNOSIS — M6281 Muscle weakness (generalized): Secondary | ICD-10-CM | POA: Diagnosis not present

## 2018-04-25 DIAGNOSIS — M25561 Pain in right knee: Secondary | ICD-10-CM | POA: Diagnosis not present

## 2018-04-25 DIAGNOSIS — M25562 Pain in left knee: Secondary | ICD-10-CM | POA: Diagnosis not present

## 2018-04-25 DIAGNOSIS — M170 Bilateral primary osteoarthritis of knee: Secondary | ICD-10-CM | POA: Diagnosis not present

## 2018-04-25 DIAGNOSIS — J40 Bronchitis, not specified as acute or chronic: Secondary | ICD-10-CM | POA: Diagnosis not present

## 2018-04-25 DIAGNOSIS — J329 Chronic sinusitis, unspecified: Secondary | ICD-10-CM | POA: Diagnosis not present

## 2018-04-25 DIAGNOSIS — M1712 Unilateral primary osteoarthritis, left knee: Secondary | ICD-10-CM | POA: Diagnosis not present

## 2018-04-25 DIAGNOSIS — Z6835 Body mass index (BMI) 35.0-35.9, adult: Secondary | ICD-10-CM | POA: Diagnosis not present

## 2018-04-26 DIAGNOSIS — M6528 Calcific tendinitis, other site: Secondary | ICD-10-CM | POA: Diagnosis not present

## 2018-04-26 DIAGNOSIS — M25561 Pain in right knee: Secondary | ICD-10-CM | POA: Diagnosis not present

## 2018-04-26 DIAGNOSIS — M1711 Unilateral primary osteoarthritis, right knee: Secondary | ICD-10-CM | POA: Diagnosis not present

## 2018-04-26 DIAGNOSIS — M25571 Pain in right ankle and joints of right foot: Secondary | ICD-10-CM | POA: Diagnosis not present

## 2018-05-04 DIAGNOSIS — M25562 Pain in left knee: Secondary | ICD-10-CM | POA: Diagnosis not present

## 2018-05-04 DIAGNOSIS — M1712 Unilateral primary osteoarthritis, left knee: Secondary | ICD-10-CM | POA: Diagnosis not present

## 2018-05-05 ENCOUNTER — Other Ambulatory Visit: Payer: Self-pay | Admitting: Vascular Surgery

## 2018-05-05 DIAGNOSIS — M1711 Unilateral primary osteoarthritis, right knee: Secondary | ICD-10-CM | POA: Diagnosis not present

## 2018-05-05 DIAGNOSIS — Z853 Personal history of malignant neoplasm of breast: Secondary | ICD-10-CM

## 2018-05-05 DIAGNOSIS — M25561 Pain in right knee: Secondary | ICD-10-CM | POA: Diagnosis not present

## 2018-05-06 DIAGNOSIS — M25571 Pain in right ankle and joints of right foot: Secondary | ICD-10-CM | POA: Diagnosis not present

## 2018-05-06 DIAGNOSIS — M6281 Muscle weakness (generalized): Secondary | ICD-10-CM | POA: Diagnosis not present

## 2018-05-09 DIAGNOSIS — M25571 Pain in right ankle and joints of right foot: Secondary | ICD-10-CM | POA: Diagnosis not present

## 2018-05-09 DIAGNOSIS — M6281 Muscle weakness (generalized): Secondary | ICD-10-CM | POA: Diagnosis not present

## 2018-05-10 DIAGNOSIS — M1712 Unilateral primary osteoarthritis, left knee: Secondary | ICD-10-CM | POA: Diagnosis not present

## 2018-05-10 DIAGNOSIS — M25562 Pain in left knee: Secondary | ICD-10-CM | POA: Diagnosis not present

## 2018-05-18 DIAGNOSIS — R05 Cough: Secondary | ICD-10-CM | POA: Diagnosis not present

## 2018-05-18 DIAGNOSIS — M25561 Pain in right knee: Secondary | ICD-10-CM | POA: Diagnosis not present

## 2018-05-18 DIAGNOSIS — J189 Pneumonia, unspecified organism: Secondary | ICD-10-CM | POA: Diagnosis not present

## 2018-05-18 DIAGNOSIS — J918 Pleural effusion in other conditions classified elsewhere: Secondary | ICD-10-CM | POA: Diagnosis not present

## 2018-05-18 DIAGNOSIS — Z0000 Encounter for general adult medical examination without abnormal findings: Secondary | ICD-10-CM | POA: Diagnosis not present

## 2018-05-18 DIAGNOSIS — M1711 Unilateral primary osteoarthritis, right knee: Secondary | ICD-10-CM | POA: Diagnosis not present

## 2018-05-19 DIAGNOSIS — M1712 Unilateral primary osteoarthritis, left knee: Secondary | ICD-10-CM | POA: Diagnosis not present

## 2018-05-19 DIAGNOSIS — M25562 Pain in left knee: Secondary | ICD-10-CM | POA: Diagnosis not present

## 2018-05-23 DIAGNOSIS — M25562 Pain in left knee: Secondary | ICD-10-CM | POA: Diagnosis not present

## 2018-05-23 DIAGNOSIS — M1712 Unilateral primary osteoarthritis, left knee: Secondary | ICD-10-CM | POA: Diagnosis not present

## 2018-05-24 DIAGNOSIS — M25561 Pain in right knee: Secondary | ICD-10-CM | POA: Diagnosis not present

## 2018-05-24 DIAGNOSIS — M1711 Unilateral primary osteoarthritis, right knee: Secondary | ICD-10-CM | POA: Diagnosis not present

## 2018-06-01 ENCOUNTER — Encounter: Payer: Self-pay | Admitting: Pulmonary Disease

## 2018-06-01 ENCOUNTER — Other Ambulatory Visit: Payer: Self-pay

## 2018-06-01 ENCOUNTER — Ambulatory Visit (INDEPENDENT_AMBULATORY_CARE_PROVIDER_SITE_OTHER): Payer: Medicare Other | Admitting: Pulmonary Disease

## 2018-06-01 ENCOUNTER — Ambulatory Visit (INDEPENDENT_AMBULATORY_CARE_PROVIDER_SITE_OTHER): Payer: Medicare Other

## 2018-06-01 VITALS — BP 138/74 | HR 100 | Ht 65.0 in | Wt 215.0 lb

## 2018-06-01 DIAGNOSIS — R053 Chronic cough: Secondary | ICD-10-CM

## 2018-06-01 DIAGNOSIS — R079 Chest pain, unspecified: Secondary | ICD-10-CM | POA: Diagnosis not present

## 2018-06-01 DIAGNOSIS — R05 Cough: Secondary | ICD-10-CM | POA: Diagnosis not present

## 2018-06-01 DIAGNOSIS — J9811 Atelectasis: Secondary | ICD-10-CM | POA: Diagnosis not present

## 2018-06-01 NOTE — Patient Instructions (Signed)
Will schedule BMET and CT chest with IV contrast  Symbicort two puffs in the morning and two puffs at night, and rinse mouth after each use  Albuterol two puffs every 6 hours as needed for cough, wheeze, or chest congestion  Will schedule telephone follow up visit in 2 weeks

## 2018-06-01 NOTE — Progress Notes (Signed)
[name]  Chief Complaint  Patient presents with  . pulm consult    Pt has had productive cough-yellow thick mucus for 16mo, SOB no wheezing.     Constitutional:  BP 138/74 (BP Location: Left Arm, Cuff Size: Normal)   Pulse 100   Ht 5\' 5"  (1.651 m)   Wt 215 lb (97.5 kg)   SpO2 98%   BMI 35.78 kg/m   Past Medical History:  Breast cancer, Depression, GERD, HTN  Brief Summary:  Heather Chavez is a 73 y.o. female former smoker with cough.  She has noticed a cough for a while.  This has gotten worse over the past several month.  She has been treated with ABx and prednisone with improvement.  She has also tried albuterol and this seems to help.  She was given a sample of symbicort from her PCP, but hasn't tried this yet.  She had CXR several weeks ago with PCP and told she had right lower lung collapse.    She brings up clear to yellow sputum.  Gets winded with brisk activity.  No fever, sweats, hemoptysis.  Takes antireflux medications.  Used to have globus sensation before this.  Has allergies especially in the Fall.    She denies skin rash, joint swelling, gland swelling, or abdominal pain.  No history of thromboembolic disease.  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  Smoked 2.5 packs per day and quit in 1983.  Born in [place], but has lived in [place] since she was 55 months old.  Has pet dogs.  No recent travel.  No history of pneumonia or TB.  CXR today (reviewed by me) shows elevated Rt diaphragm with indistinct Rt heart border.  This was not evident on CXR from 2008, and mildly evident on CXR from 2014.   Physical Exam:   Appearance - well kempt   ENMT - clear nasal mucosa, midline nasal  septum, no oral exudates, no LAN, trachea midline, raspy voice  Respiratory - normal chest wall, normal respiratory effort, no accessory muscle use, no wheeze/rales, decreased BS Rt base  CV - s1s2 regular rate and rhythm, no murmurs, no  peripheral edema, radial pulses symmetric  GI - soft, non tender, no masses  Lymph - no adenopathy noted in neck and axillary areas  MSK - normal gait  Ext - no cyanosis, clubbing, or joint inflammation noted, wearing compression hose  Skin - no rashes, lesions, or ulcers  Neuro - normal strength, oriented x 3  Psych - normal mood and affect   Discussion:  She has prior history of smoking and reports seasonal allergies.  She also has history of reflux.  She has persistent, productive cough.  She reports benefit from prior prednisone therapy and albuterol therapy.  Her CXR shows volume loss in Rt lower lung.  This was not evident on imaging from 2008, but was present from 2014 but not as prominent.    Assessment/Plan:   Chronic bronchitis with history of allergic rhinitis. - defer PFT for now in setting of COVID 19 pandemic, in order to avoid aerosolizing procedures - will have her try symbicort 160 two puffs bid - prn albuterol  Right lower lung volume loss with history of smoking and recurrent breast cancer. - will arrange for CT chest with IV contrast to further assess   Patient Instructions  Will schedule BMET and CT chest with IV contrast  Symbicort two puffs in the morning and two  puffs at night, and rinse mouth after each use  Albuterol two puffs every 6 hours as needed for cough, wheeze, or chest congestion  Will schedule telephone follow up visit in 2 weeks    Chesley Mires, MD [place] Pager: [phone] 06/01/2018, 11:20 AM  Flow Sheet     Pulmonary tests:    Review of Systems:  Reviewed and negative except in HPI  Medications:   Allergies as of 06/01/2018      Reactions   Codeine Other (See Comments)   Tylenol [acetaminophen] Other (See Comments)   Unknown Unknown      Medication List       Accurate as of June 01, 2018 11:20 AM. Always use your most recent med list.        fluticasone 50 MCG/ACT nasal spray  Commonly known as:  FLONASE fluticasone 50 mcg/actuation nasal spray,suspension   gabapentin 100 MG capsule Commonly known as:  NEURONTIN 400 mg daily.   hydrochlorothiazide 25 MG tablet Commonly known as:  HYDRODIURIL Take 25 mg by mouth daily.   losartan 25 MG tablet Commonly known as:  COZAAR Take 25 mg by mouth daily.   meloxicam 15 MG tablet Commonly known as:  MOBIC   omeprazole 20 MG capsule Commonly known as:  PRILOSEC Take 20 mg by mouth.   tamoxifen 10 MG tablet Commonly known as:  NOLVADEX Take 10 mg by mouth 2 (two) times daily.       Past Surgical History:  She  has a past surgical history that includes Breast lumpectomy and Breast biopsy.  Family History:  Her family history includes Breast cancer in her mother and sister; Cancer in her paternal grandmother; Cancer (age of onset: 32) in her sister; Cancer (age of onset: 6) in her mother and paternal aunt; Cancer (age of onset: 63) in her father; Other in her mother.  Social History:  She  reports that she quit smoking about 37 years ago. Her smoking use included cigarettes. She has a 37.50 pack-year smoking history. She has never used smokeless tobacco. She reports current alcohol use. She reports that she does not use drugs.

## 2018-06-02 ENCOUNTER — Telehealth: Payer: Self-pay | Admitting: Pulmonary Disease

## 2018-06-02 DIAGNOSIS — M1711 Unilateral primary osteoarthritis, right knee: Secondary | ICD-10-CM | POA: Diagnosis not present

## 2018-06-02 DIAGNOSIS — M25561 Pain in right knee: Secondary | ICD-10-CM | POA: Diagnosis not present

## 2018-06-02 DIAGNOSIS — M7122 Synovial cyst of popliteal space [Baker], left knee: Secondary | ICD-10-CM | POA: Diagnosis not present

## 2018-06-02 DIAGNOSIS — J9811 Atelectasis: Secondary | ICD-10-CM | POA: Diagnosis not present

## 2018-06-02 DIAGNOSIS — Z6836 Body mass index (BMI) 36.0-36.9, adult: Secondary | ICD-10-CM | POA: Diagnosis not present

## 2018-06-02 NOTE — Telephone Encounter (Signed)
Order that was placed by [facility] has been faxed to the given fax number. Nothing further needed.

## 2018-06-03 ENCOUNTER — Institutional Professional Consult (permissible substitution): Payer: Medicare Other | Admitting: Pulmonary Disease

## 2018-06-07 ENCOUNTER — Telehealth: Payer: Self-pay | Admitting: Pulmonary Disease

## 2018-06-07 NOTE — Telephone Encounter (Signed)
I spoke to pt, she states she had her CT scan at [facility] (verified with Rodena Piety). I called over there to have them fax the results. They need a cover sheet requesting the information. I faxed the request and will await the results so Dr. Halford Chessman can review. Route to [place] to look out for it.

## 2018-06-08 NOTE — Telephone Encounter (Addendum)
Kelli have we received anything for this patient yet?

## 2018-06-08 NOTE — Telephone Encounter (Signed)
The fax has been rec'd [facility] will be sending the PET scan CD this week for review Awaiting CD at this time.

## 2018-06-08 NOTE — Telephone Encounter (Signed)
I have called [facility] and they have not received the fax at this time. I have Refax this to [facility] @[phone]. I have asked that the mail [place] the copy of the Pet scan.

## 2018-06-09 DIAGNOSIS — R739 Hyperglycemia, unspecified: Secondary | ICD-10-CM | POA: Diagnosis not present

## 2018-06-09 DIAGNOSIS — I10 Essential (primary) hypertension: Secondary | ICD-10-CM | POA: Diagnosis not present

## 2018-06-09 DIAGNOSIS — Z6836 Body mass index (BMI) 36.0-36.9, adult: Secondary | ICD-10-CM | POA: Diagnosis not present

## 2018-06-09 DIAGNOSIS — G629 Polyneuropathy, unspecified: Secondary | ICD-10-CM | POA: Diagnosis not present

## 2018-06-09 DIAGNOSIS — Z79899 Other long term (current) drug therapy: Secondary | ICD-10-CM | POA: Diagnosis not present

## 2018-06-09 DIAGNOSIS — E785 Hyperlipidemia, unspecified: Secondary | ICD-10-CM | POA: Diagnosis not present

## 2018-06-13 NOTE — Telephone Encounter (Signed)
I have called [facility]. The stated they did not have a pet scan with them. I ment to send for CT scan so I have fixed the fax sheet and refax this.  And received conformation. Will f/u

## 2018-06-14 NOTE — Telephone Encounter (Signed)
Spoke with [facility] yesterday, they stated they are sending the report and CD on scan this week. Awaiting the CD and report for VS to review.

## 2018-06-14 NOTE — Telephone Encounter (Signed)
CT chest 06/01/18 >> coronary calcification, emphysema, scar with volume loss RML and RLL   Please let her know that CT chest shows scarring in her Rt middle and lower lung.  This is cause of chest xray findings.  No evidence for tumors or growths.  She also has changes of emphysema.  She should continue using symbicort.

## 2018-06-14 NOTE — Telephone Encounter (Signed)
Called pt to inform of these results and recommendations. Pt states she was "caught off guard" by the phone call and questioned if she should keep her televisit appt w/ NP tomorrow 06/15/2018 because these CT results would be discussed as well. I informed her that the NP will go over results in more detail with her and answer any in-depth questions she may have regarding the findings, therefore, she would benefit by keeping the televisit. Pt agreed to this, verbalized understanding, and had no additional questions. Nothing further needed at this time.

## 2018-06-15 ENCOUNTER — Encounter: Payer: Self-pay | Admitting: Primary Care

## 2018-06-15 ENCOUNTER — Other Ambulatory Visit: Payer: Self-pay

## 2018-06-15 ENCOUNTER — Ambulatory Visit (INDEPENDENT_AMBULATORY_CARE_PROVIDER_SITE_OTHER): Payer: Medicare Other | Admitting: Primary Care

## 2018-06-15 DIAGNOSIS — J9811 Atelectasis: Secondary | ICD-10-CM

## 2018-06-15 MED ORDER — BUDESONIDE-FORMOTEROL FUMARATE 160-4.5 MCG/ACT IN AERO: 2.0000 | INHALATION_SPRAY | Freq: Two times a day (BID) | RESPIRATORY_TRACT | 6 refills | Status: DC

## 2018-06-15 NOTE — Patient Instructions (Signed)
   Chronic cough/emphyseama: - Continue Symbicort twice daily (refill sent)  Recommendations: - IS 10x hours while awake or when you think about it   Orders: - Needs PFTs prior to next visit  - Needs incentive spirometer   Follow up: - 4 months with Dr. Halford Chessman or sooner if needed

## 2018-06-15 NOTE — Progress Notes (Signed)
Virtual Visit via Telephone Note  I connected with Heather Chavez on 06/15/18 at  9:30 AM EDT by telephone and verified that I am speaking with the correct person using two identifiers.   I discussed the limitations, risks, security and privacy concerns of performing an evaluation and management service by telephone and the availability of in person appointments. I also discussed with the patient that there may be a patient responsible charge related to this service. The patient expressed understanding and agreed to proceed.   History of Present Illness: 73 year old female, former smoker. Patient of Dr. Halford Chessman, seen for initial consult on 06/01/18 for chronic cough for 6 months. She has been treated with abx and prednisone with improvement. She has also tried albuterol which seems to help. She was given a sample of symbicort from her PCP but had not tried it. CXR several weeks ago at PCP and was told she had right lower lung collapse. CT chest 06/01/18 showed coronary calcification, emphysema, scar with volume loss RML and RLL No evidence for tumors or growths. Continue Symbicort.   06/15/2018 Patient called today for 2 week follow-up. Patient states that her breathing has been ok, reports that she is not coughing. Wasn't sure if she noticed any real changes with Symbicort until we spoke and she realized that her cough had improved. Discussed recent CT chest results.   Observations/Objective:  Observations: - No shortness of breath or wheezing   Imaging: 06/02/18- CT chest showed no acute findings, stable scar with volume loss involving the right middle lobe and right lower lobe (appears similar to comparison exam). No suspicious pulmonary nodules. Aortic atherosclerosis and emphysema.     Assessment and Plan:  Emphysema: - Former smoker - Needs PFTs  Chronic cough - Improved - Continue Symbicort 160 2 bid   Aortic atherosclerosis - Discussed maintaining healthy weight and diet -  Encourage regular exercise - Keep blood pressure and cholesterol controlled - Discuss further with primary care   Abnormal CT chest: - Stable appearance of scarring with volume loss to right middle and right lower lobe, no suspicious pulmonary nodules - No shortness of breath - Recommend IS 10x hour while awake  - May consider repeating CT or HRCT in 1 year   Follow Up Instructions:   - Follow up in 4 months with Dr. Halford Chessman or sooner if needed  I discussed the assessment and treatment plan with the patient. The patient was provided an opportunity to ask questions and all were answered. The patient agreed with the plan and demonstrated an understanding of the instructions.   The patient was advised to call back or seek an in-person evaluation if the symptoms worsen or if the condition fails to improve as anticipated.  I provided 20 minutes of non-face-to-face time during this encounter.   Martyn Ehrich, NP

## 2018-06-21 ENCOUNTER — Telehealth: Payer: Self-pay | Admitting: Pulmonary Disease

## 2018-06-21 NOTE — Telephone Encounter (Signed)
Called and spoke w/ pt. Pt states the price of her Symbicort 160 inhaler is $354 for 1 mo supply and that she cannot afford that. Informed her we could try patient assistance program via the manufacturer.   Called pt's pharamacy [facility] to verify if medication is covered. Pharmacy tech informed me it was partially covered--w/ insurance covering $100 of the total cost.   Called pt back to inform her of this. I also let her know that the pt assistance program process takes awhile. Normally, we would give samples to help maintain therapy until the process is complete, but because the office is currently not giving out samples, decided to try another method. Suggested pt to call her insurance company and ask for covered alternative inhalers that fit her price and to give [place] a call back once she has this information. Pt agreed to this, verbalized understanding, and had no further questions. Will leave message open to f/u on.

## 2018-06-27 NOTE — Telephone Encounter (Signed)
Contacted patient by phone regarding symbicort refill vs alternative.  Patient states she is 'working on it' and appreciated the call back.  Advised we were checking to see if she wanted to try alternative but patient states she will let us know.  At the moment she does not need anything.

## 2018-07-25 DIAGNOSIS — M25571 Pain in right ankle and joints of right foot: Secondary | ICD-10-CM | POA: Diagnosis not present

## 2018-07-25 DIAGNOSIS — Z6836 Body mass index (BMI) 36.0-36.9, adult: Secondary | ICD-10-CM | POA: Diagnosis not present

## 2018-08-09 DIAGNOSIS — M62830 Muscle spasm of back: Secondary | ICD-10-CM | POA: Diagnosis not present

## 2018-08-09 DIAGNOSIS — M9905 Segmental and somatic dysfunction of pelvic region: Secondary | ICD-10-CM | POA: Diagnosis not present

## 2018-08-09 DIAGNOSIS — M9902 Segmental and somatic dysfunction of thoracic region: Secondary | ICD-10-CM | POA: Diagnosis not present

## 2018-08-09 DIAGNOSIS — M545 Low back pain: Secondary | ICD-10-CM | POA: Diagnosis not present

## 2018-08-09 DIAGNOSIS — M9903 Segmental and somatic dysfunction of lumbar region: Secondary | ICD-10-CM | POA: Diagnosis not present

## 2018-08-15 ENCOUNTER — Other Ambulatory Visit: Payer: Self-pay

## 2018-08-15 ENCOUNTER — Ambulatory Visit
Admission: RE | Admit: 2018-08-15 | Discharge: 2018-08-15 | Disposition: A | Discharge status: Home / Self Care | Payer: Medicare Other | Source: Ambulatory Visit | Attending: Vascular Surgery | Admitting: Vascular Surgery

## 2018-08-15 DIAGNOSIS — R922 Inconclusive mammogram: Secondary | ICD-10-CM | POA: Diagnosis not present

## 2018-08-15 DIAGNOSIS — Z853 Personal history of malignant neoplasm of breast: Secondary | ICD-10-CM | POA: Diagnosis not present

## 2018-08-19 DIAGNOSIS — Z853 Personal history of malignant neoplasm of breast: Secondary | ICD-10-CM | POA: Diagnosis not present

## 2018-08-19 DIAGNOSIS — C50912 Malignant neoplasm of unspecified site of left female breast: Secondary | ICD-10-CM | POA: Diagnosis not present

## 2018-08-19 DIAGNOSIS — Z79810 Long term (current) use of selective estrogen receptor modulators (SERMs): Secondary | ICD-10-CM | POA: Diagnosis not present

## 2018-08-19 DIAGNOSIS — Z170 Estrogen receptor positive status [ER+]: Secondary | ICD-10-CM | POA: Diagnosis not present

## 2018-08-26 DIAGNOSIS — M1712 Unilateral primary osteoarthritis, left knee: Secondary | ICD-10-CM | POA: Diagnosis not present

## 2018-08-26 DIAGNOSIS — M25562 Pain in left knee: Secondary | ICD-10-CM | POA: Diagnosis not present

## 2018-09-14 DIAGNOSIS — Z170 Estrogen receptor positive status [ER+]: Secondary | ICD-10-CM | POA: Diagnosis not present

## 2018-09-14 DIAGNOSIS — C50112 Malignant neoplasm of central portion of left female breast: Secondary | ICD-10-CM | POA: Diagnosis not present

## 2018-09-19 DIAGNOSIS — Z170 Estrogen receptor positive status [ER+]: Secondary | ICD-10-CM | POA: Diagnosis not present

## 2018-09-19 DIAGNOSIS — Z853 Personal history of malignant neoplasm of breast: Secondary | ICD-10-CM | POA: Diagnosis not present

## 2018-09-19 DIAGNOSIS — C50212 Malignant neoplasm of upper-inner quadrant of left female breast: Secondary | ICD-10-CM | POA: Diagnosis not present

## 2018-09-19 HISTORY — DX: Personal history of malignant neoplasm of breast: Z85.3

## 2018-09-20 DIAGNOSIS — M25562 Pain in left knee: Secondary | ICD-10-CM | POA: Diagnosis not present

## 2018-09-20 DIAGNOSIS — M1712 Unilateral primary osteoarthritis, left knee: Secondary | ICD-10-CM | POA: Diagnosis not present

## 2018-09-21 DIAGNOSIS — M25561 Pain in right knee: Secondary | ICD-10-CM | POA: Diagnosis not present

## 2018-09-21 DIAGNOSIS — M170 Bilateral primary osteoarthritis of knee: Secondary | ICD-10-CM | POA: Diagnosis not present

## 2018-09-21 DIAGNOSIS — M25562 Pain in left knee: Secondary | ICD-10-CM | POA: Diagnosis not present

## 2018-09-27 DIAGNOSIS — H35362 Drusen (degenerative) of macula, left eye: Secondary | ICD-10-CM | POA: Diagnosis not present

## 2018-09-27 DIAGNOSIS — H35361 Drusen (degenerative) of macula, right eye: Secondary | ICD-10-CM | POA: Diagnosis not present

## 2018-09-27 DIAGNOSIS — H353122 Nonexudative age-related macular degeneration, left eye, intermediate dry stage: Secondary | ICD-10-CM | POA: Diagnosis not present

## 2018-09-27 DIAGNOSIS — H353113 Nonexudative age-related macular degeneration, right eye, advanced atrophic without subfoveal involvement: Secondary | ICD-10-CM | POA: Diagnosis not present

## 2018-09-29 DIAGNOSIS — L821 Other seborrheic keratosis: Secondary | ICD-10-CM | POA: Diagnosis not present

## 2018-09-29 DIAGNOSIS — C44519 Basal cell carcinoma of skin of other part of trunk: Secondary | ICD-10-CM | POA: Diagnosis not present

## 2018-09-29 DIAGNOSIS — L578 Other skin changes due to chronic exposure to nonionizing radiation: Secondary | ICD-10-CM | POA: Diagnosis not present

## 2018-09-29 DIAGNOSIS — L570 Actinic keratosis: Secondary | ICD-10-CM | POA: Diagnosis not present

## 2018-10-04 ENCOUNTER — Telehealth: Payer: Self-pay | Admitting: Pulmonary Disease

## 2018-10-04 DIAGNOSIS — Z01818 Encounter for other preprocedural examination: Secondary | ICD-10-CM | POA: Diagnosis not present

## 2018-10-04 DIAGNOSIS — E559 Vitamin D deficiency, unspecified: Secondary | ICD-10-CM | POA: Diagnosis not present

## 2018-10-04 DIAGNOSIS — R52 Pain, unspecified: Secondary | ICD-10-CM | POA: Diagnosis not present

## 2018-10-04 DIAGNOSIS — M79609 Pain in unspecified limb: Secondary | ICD-10-CM | POA: Diagnosis not present

## 2018-10-04 DIAGNOSIS — Z96659 Presence of unspecified artificial knee joint: Secondary | ICD-10-CM | POA: Diagnosis not present

## 2018-10-04 DIAGNOSIS — Z79899 Other long term (current) drug therapy: Secondary | ICD-10-CM | POA: Diagnosis not present

## 2018-10-04 DIAGNOSIS — M1712 Unilateral primary osteoarthritis, left knee: Secondary | ICD-10-CM | POA: Diagnosis not present

## 2018-10-04 NOTE — Telephone Encounter (Signed)
I attempted to call pt. At the time of the call, pt did not answer and the line went to voicemail. I have left pt a message to call back X1.

## 2018-10-05 NOTE — Telephone Encounter (Signed)
LMTCB x2 for pt 

## 2018-10-07 NOTE — Progress Notes (Signed)
Reviewed and agree with assessment/plan.   Ramello Cordial, MD [facility] 03/04/2016, 12:24 PM Pager:  [phone]  

## 2018-10-07 NOTE — Telephone Encounter (Signed)
Attempt 3 for calling patient back per triage protocol I will close message

## 2018-10-12 DIAGNOSIS — Z6836 Body mass index (BMI) 36.0-36.9, adult: Secondary | ICD-10-CM | POA: Diagnosis not present

## 2018-10-12 DIAGNOSIS — I10 Essential (primary) hypertension: Secondary | ICD-10-CM | POA: Diagnosis not present

## 2018-10-25 DIAGNOSIS — Z6836 Body mass index (BMI) 36.0-36.9, adult: Secondary | ICD-10-CM | POA: Diagnosis not present

## 2018-10-25 DIAGNOSIS — B372 Candidiasis of skin and nail: Secondary | ICD-10-CM | POA: Diagnosis not present

## 2018-10-31 DIAGNOSIS — M79609 Pain in unspecified limb: Secondary | ICD-10-CM | POA: Diagnosis not present

## 2018-10-31 DIAGNOSIS — Z79899 Other long term (current) drug therapy: Secondary | ICD-10-CM | POA: Diagnosis not present

## 2018-10-31 DIAGNOSIS — E559 Vitamin D deficiency, unspecified: Secondary | ICD-10-CM | POA: Diagnosis not present

## 2018-10-31 DIAGNOSIS — R52 Pain, unspecified: Secondary | ICD-10-CM | POA: Diagnosis not present

## 2018-10-31 DIAGNOSIS — Z01818 Encounter for other preprocedural examination: Secondary | ICD-10-CM | POA: Diagnosis not present

## 2018-11-08 DIAGNOSIS — M1712 Unilateral primary osteoarthritis, left knee: Secondary | ICD-10-CM | POA: Diagnosis not present

## 2018-11-08 DIAGNOSIS — Z87891 Personal history of nicotine dependence: Secondary | ICD-10-CM | POA: Diagnosis not present

## 2018-11-08 DIAGNOSIS — Z79899 Other long term (current) drug therapy: Secondary | ICD-10-CM | POA: Diagnosis not present

## 2018-11-08 DIAGNOSIS — G8918 Other acute postprocedural pain: Secondary | ICD-10-CM | POA: Diagnosis not present

## 2018-11-08 DIAGNOSIS — E119 Type 2 diabetes mellitus without complications: Secondary | ICD-10-CM | POA: Diagnosis not present

## 2018-11-08 DIAGNOSIS — I10 Essential (primary) hypertension: Secondary | ICD-10-CM | POA: Diagnosis not present

## 2018-11-08 DIAGNOSIS — Z471 Aftercare following joint replacement surgery: Secondary | ICD-10-CM | POA: Diagnosis not present

## 2018-11-08 DIAGNOSIS — Z96652 Presence of left artificial knee joint: Secondary | ICD-10-CM | POA: Diagnosis not present

## 2018-11-08 DIAGNOSIS — Z7984 Long term (current) use of oral hypoglycemic drugs: Secondary | ICD-10-CM | POA: Diagnosis not present

## 2018-11-08 DIAGNOSIS — E669 Obesity, unspecified: Secondary | ICD-10-CM | POA: Diagnosis not present

## 2018-11-08 DIAGNOSIS — K219 Gastro-esophageal reflux disease without esophagitis: Secondary | ICD-10-CM | POA: Diagnosis not present

## 2018-11-08 DIAGNOSIS — J449 Chronic obstructive pulmonary disease, unspecified: Secondary | ICD-10-CM | POA: Diagnosis not present

## 2018-11-08 DIAGNOSIS — Z6835 Body mass index (BMI) 35.0-35.9, adult: Secondary | ICD-10-CM | POA: Diagnosis not present

## 2018-11-08 DIAGNOSIS — H3530 Unspecified macular degeneration: Secondary | ICD-10-CM | POA: Diagnosis not present

## 2018-11-09 DIAGNOSIS — M1712 Unilateral primary osteoarthritis, left knee: Secondary | ICD-10-CM | POA: Diagnosis not present

## 2018-11-09 DIAGNOSIS — E119 Type 2 diabetes mellitus without complications: Secondary | ICD-10-CM | POA: Diagnosis not present

## 2018-11-09 DIAGNOSIS — J449 Chronic obstructive pulmonary disease, unspecified: Secondary | ICD-10-CM | POA: Diagnosis not present

## 2018-11-09 DIAGNOSIS — K219 Gastro-esophageal reflux disease without esophagitis: Secondary | ICD-10-CM | POA: Diagnosis not present

## 2018-11-09 DIAGNOSIS — I10 Essential (primary) hypertension: Secondary | ICD-10-CM | POA: Diagnosis not present

## 2018-11-09 DIAGNOSIS — H3530 Unspecified macular degeneration: Secondary | ICD-10-CM | POA: Diagnosis not present

## 2018-11-10 DIAGNOSIS — H3530 Unspecified macular degeneration: Secondary | ICD-10-CM | POA: Diagnosis not present

## 2018-11-10 DIAGNOSIS — Z7951 Long term (current) use of inhaled steroids: Secondary | ICD-10-CM | POA: Diagnosis not present

## 2018-11-10 DIAGNOSIS — Z853 Personal history of malignant neoplasm of breast: Secondary | ICD-10-CM | POA: Diagnosis not present

## 2018-11-10 DIAGNOSIS — I10 Essential (primary) hypertension: Secondary | ICD-10-CM | POA: Diagnosis not present

## 2018-11-10 DIAGNOSIS — F329 Major depressive disorder, single episode, unspecified: Secondary | ICD-10-CM | POA: Diagnosis not present

## 2018-11-10 DIAGNOSIS — Z96652 Presence of left artificial knee joint: Secondary | ICD-10-CM | POA: Diagnosis not present

## 2018-11-10 DIAGNOSIS — K219 Gastro-esophageal reflux disease without esophagitis: Secondary | ICD-10-CM | POA: Diagnosis not present

## 2018-11-10 DIAGNOSIS — M6528 Calcific tendinitis, other site: Secondary | ICD-10-CM | POA: Diagnosis not present

## 2018-11-10 DIAGNOSIS — Z7984 Long term (current) use of oral hypoglycemic drugs: Secondary | ICD-10-CM | POA: Diagnosis not present

## 2018-11-10 DIAGNOSIS — G2581 Restless legs syndrome: Secondary | ICD-10-CM | POA: Diagnosis not present

## 2018-11-10 DIAGNOSIS — Z471 Aftercare following joint replacement surgery: Secondary | ICD-10-CM | POA: Diagnosis not present

## 2018-11-10 DIAGNOSIS — Z7982 Long term (current) use of aspirin: Secondary | ICD-10-CM | POA: Diagnosis not present

## 2018-11-11 DIAGNOSIS — Z471 Aftercare following joint replacement surgery: Secondary | ICD-10-CM | POA: Diagnosis not present

## 2018-11-11 DIAGNOSIS — Z7982 Long term (current) use of aspirin: Secondary | ICD-10-CM | POA: Diagnosis not present

## 2018-11-11 DIAGNOSIS — Z7951 Long term (current) use of inhaled steroids: Secondary | ICD-10-CM | POA: Diagnosis not present

## 2018-11-11 DIAGNOSIS — H3530 Unspecified macular degeneration: Secondary | ICD-10-CM | POA: Diagnosis not present

## 2018-11-11 DIAGNOSIS — Z96652 Presence of left artificial knee joint: Secondary | ICD-10-CM | POA: Diagnosis not present

## 2018-11-11 DIAGNOSIS — Z7984 Long term (current) use of oral hypoglycemic drugs: Secondary | ICD-10-CM | POA: Diagnosis not present

## 2018-11-12 DIAGNOSIS — Z7982 Long term (current) use of aspirin: Secondary | ICD-10-CM | POA: Diagnosis not present

## 2018-11-12 DIAGNOSIS — H3530 Unspecified macular degeneration: Secondary | ICD-10-CM | POA: Diagnosis not present

## 2018-11-12 DIAGNOSIS — Z7984 Long term (current) use of oral hypoglycemic drugs: Secondary | ICD-10-CM | POA: Diagnosis not present

## 2018-11-12 DIAGNOSIS — Z471 Aftercare following joint replacement surgery: Secondary | ICD-10-CM | POA: Diagnosis not present

## 2018-11-12 DIAGNOSIS — Z7951 Long term (current) use of inhaled steroids: Secondary | ICD-10-CM | POA: Diagnosis not present

## 2018-11-12 DIAGNOSIS — Z96652 Presence of left artificial knee joint: Secondary | ICD-10-CM | POA: Diagnosis not present

## 2018-11-14 DIAGNOSIS — Z471 Aftercare following joint replacement surgery: Secondary | ICD-10-CM | POA: Diagnosis not present

## 2018-11-14 DIAGNOSIS — Z7982 Long term (current) use of aspirin: Secondary | ICD-10-CM | POA: Diagnosis not present

## 2018-11-14 DIAGNOSIS — Z96652 Presence of left artificial knee joint: Secondary | ICD-10-CM | POA: Diagnosis not present

## 2018-11-14 DIAGNOSIS — H3530 Unspecified macular degeneration: Secondary | ICD-10-CM | POA: Diagnosis not present

## 2018-11-14 DIAGNOSIS — Z7951 Long term (current) use of inhaled steroids: Secondary | ICD-10-CM | POA: Diagnosis not present

## 2018-11-14 DIAGNOSIS — Z7984 Long term (current) use of oral hypoglycemic drugs: Secondary | ICD-10-CM | POA: Diagnosis not present

## 2018-11-15 DIAGNOSIS — Z7951 Long term (current) use of inhaled steroids: Secondary | ICD-10-CM | POA: Diagnosis not present

## 2018-11-15 DIAGNOSIS — Z471 Aftercare following joint replacement surgery: Secondary | ICD-10-CM | POA: Diagnosis not present

## 2018-11-15 DIAGNOSIS — Z96652 Presence of left artificial knee joint: Secondary | ICD-10-CM | POA: Diagnosis not present

## 2018-11-15 DIAGNOSIS — Z7982 Long term (current) use of aspirin: Secondary | ICD-10-CM | POA: Diagnosis not present

## 2018-11-15 DIAGNOSIS — H3530 Unspecified macular degeneration: Secondary | ICD-10-CM | POA: Diagnosis not present

## 2018-11-15 DIAGNOSIS — Z7984 Long term (current) use of oral hypoglycemic drugs: Secondary | ICD-10-CM | POA: Diagnosis not present

## 2018-11-17 DIAGNOSIS — Z7951 Long term (current) use of inhaled steroids: Secondary | ICD-10-CM | POA: Diagnosis not present

## 2018-11-17 DIAGNOSIS — Z7984 Long term (current) use of oral hypoglycemic drugs: Secondary | ICD-10-CM | POA: Diagnosis not present

## 2018-11-17 DIAGNOSIS — H3530 Unspecified macular degeneration: Secondary | ICD-10-CM | POA: Diagnosis not present

## 2018-11-17 DIAGNOSIS — Z471 Aftercare following joint replacement surgery: Secondary | ICD-10-CM | POA: Diagnosis not present

## 2018-11-17 DIAGNOSIS — Z7982 Long term (current) use of aspirin: Secondary | ICD-10-CM | POA: Diagnosis not present

## 2018-11-17 DIAGNOSIS — Z96652 Presence of left artificial knee joint: Secondary | ICD-10-CM | POA: Diagnosis not present

## 2018-11-18 DIAGNOSIS — Z7951 Long term (current) use of inhaled steroids: Secondary | ICD-10-CM | POA: Diagnosis not present

## 2018-11-18 DIAGNOSIS — Z7982 Long term (current) use of aspirin: Secondary | ICD-10-CM | POA: Diagnosis not present

## 2018-11-18 DIAGNOSIS — H3530 Unspecified macular degeneration: Secondary | ICD-10-CM | POA: Diagnosis not present

## 2018-11-18 DIAGNOSIS — Z96652 Presence of left artificial knee joint: Secondary | ICD-10-CM | POA: Diagnosis not present

## 2018-11-18 DIAGNOSIS — Z471 Aftercare following joint replacement surgery: Secondary | ICD-10-CM | POA: Diagnosis not present

## 2018-11-18 DIAGNOSIS — Z7984 Long term (current) use of oral hypoglycemic drugs: Secondary | ICD-10-CM | POA: Diagnosis not present

## 2018-11-22 DIAGNOSIS — M25662 Stiffness of left knee, not elsewhere classified: Secondary | ICD-10-CM | POA: Diagnosis not present

## 2018-11-22 DIAGNOSIS — M25562 Pain in left knee: Secondary | ICD-10-CM | POA: Diagnosis not present

## 2018-11-22 DIAGNOSIS — M6281 Muscle weakness (generalized): Secondary | ICD-10-CM | POA: Diagnosis not present

## 2018-11-22 DIAGNOSIS — R2689 Other abnormalities of gait and mobility: Secondary | ICD-10-CM | POA: Diagnosis not present

## 2018-11-24 DIAGNOSIS — M25562 Pain in left knee: Secondary | ICD-10-CM | POA: Diagnosis not present

## 2018-11-24 DIAGNOSIS — M25662 Stiffness of left knee, not elsewhere classified: Secondary | ICD-10-CM | POA: Diagnosis not present

## 2018-11-24 DIAGNOSIS — M6281 Muscle weakness (generalized): Secondary | ICD-10-CM | POA: Diagnosis not present

## 2018-11-24 DIAGNOSIS — R2689 Other abnormalities of gait and mobility: Secondary | ICD-10-CM | POA: Diagnosis not present

## 2018-11-28 DIAGNOSIS — M25562 Pain in left knee: Secondary | ICD-10-CM | POA: Diagnosis not present

## 2018-11-28 DIAGNOSIS — M25662 Stiffness of left knee, not elsewhere classified: Secondary | ICD-10-CM | POA: Diagnosis not present

## 2018-11-28 DIAGNOSIS — R2689 Other abnormalities of gait and mobility: Secondary | ICD-10-CM | POA: Diagnosis not present

## 2018-11-28 DIAGNOSIS — M6281 Muscle weakness (generalized): Secondary | ICD-10-CM | POA: Diagnosis not present

## 2018-12-01 DIAGNOSIS — R2689 Other abnormalities of gait and mobility: Secondary | ICD-10-CM | POA: Diagnosis not present

## 2018-12-01 DIAGNOSIS — M25662 Stiffness of left knee, not elsewhere classified: Secondary | ICD-10-CM | POA: Diagnosis not present

## 2018-12-01 DIAGNOSIS — M6281 Muscle weakness (generalized): Secondary | ICD-10-CM | POA: Diagnosis not present

## 2018-12-01 DIAGNOSIS — M25562 Pain in left knee: Secondary | ICD-10-CM | POA: Diagnosis not present

## 2018-12-05 DIAGNOSIS — R2689 Other abnormalities of gait and mobility: Secondary | ICD-10-CM | POA: Diagnosis not present

## 2018-12-05 DIAGNOSIS — M6281 Muscle weakness (generalized): Secondary | ICD-10-CM | POA: Diagnosis not present

## 2018-12-05 DIAGNOSIS — M25562 Pain in left knee: Secondary | ICD-10-CM | POA: Diagnosis not present

## 2018-12-05 DIAGNOSIS — M25662 Stiffness of left knee, not elsewhere classified: Secondary | ICD-10-CM | POA: Diagnosis not present

## 2018-12-07 DIAGNOSIS — M25562 Pain in left knee: Secondary | ICD-10-CM | POA: Diagnosis not present

## 2018-12-07 DIAGNOSIS — R2689 Other abnormalities of gait and mobility: Secondary | ICD-10-CM | POA: Diagnosis not present

## 2018-12-07 DIAGNOSIS — M25662 Stiffness of left knee, not elsewhere classified: Secondary | ICD-10-CM | POA: Diagnosis not present

## 2018-12-07 DIAGNOSIS — M6281 Muscle weakness (generalized): Secondary | ICD-10-CM | POA: Diagnosis not present

## 2018-12-08 DIAGNOSIS — Z23 Encounter for immunization: Secondary | ICD-10-CM | POA: Diagnosis not present

## 2018-12-15 DIAGNOSIS — M6281 Muscle weakness (generalized): Secondary | ICD-10-CM | POA: Diagnosis not present

## 2018-12-15 DIAGNOSIS — R2689 Other abnormalities of gait and mobility: Secondary | ICD-10-CM | POA: Diagnosis not present

## 2018-12-15 DIAGNOSIS — M25662 Stiffness of left knee, not elsewhere classified: Secondary | ICD-10-CM | POA: Diagnosis not present

## 2018-12-15 DIAGNOSIS — M25562 Pain in left knee: Secondary | ICD-10-CM | POA: Diagnosis not present

## 2018-12-20 DIAGNOSIS — R2689 Other abnormalities of gait and mobility: Secondary | ICD-10-CM | POA: Diagnosis not present

## 2018-12-20 DIAGNOSIS — M1712 Unilateral primary osteoarthritis, left knee: Secondary | ICD-10-CM | POA: Diagnosis not present

## 2018-12-20 DIAGNOSIS — M25662 Stiffness of left knee, not elsewhere classified: Secondary | ICD-10-CM | POA: Diagnosis not present

## 2018-12-20 DIAGNOSIS — M25562 Pain in left knee: Secondary | ICD-10-CM | POA: Diagnosis not present

## 2018-12-20 DIAGNOSIS — M6281 Muscle weakness (generalized): Secondary | ICD-10-CM | POA: Diagnosis not present

## 2018-12-22 DIAGNOSIS — M25562 Pain in left knee: Secondary | ICD-10-CM | POA: Diagnosis not present

## 2018-12-22 DIAGNOSIS — M6281 Muscle weakness (generalized): Secondary | ICD-10-CM | POA: Diagnosis not present

## 2018-12-22 DIAGNOSIS — M25662 Stiffness of left knee, not elsewhere classified: Secondary | ICD-10-CM | POA: Diagnosis not present

## 2018-12-22 DIAGNOSIS — R2689 Other abnormalities of gait and mobility: Secondary | ICD-10-CM | POA: Diagnosis not present

## 2018-12-29 DIAGNOSIS — M25662 Stiffness of left knee, not elsewhere classified: Secondary | ICD-10-CM | POA: Diagnosis not present

## 2018-12-29 DIAGNOSIS — R2689 Other abnormalities of gait and mobility: Secondary | ICD-10-CM | POA: Diagnosis not present

## 2018-12-29 DIAGNOSIS — M6281 Muscle weakness (generalized): Secondary | ICD-10-CM | POA: Diagnosis not present

## 2018-12-29 DIAGNOSIS — M25562 Pain in left knee: Secondary | ICD-10-CM | POA: Diagnosis not present

## 2019-01-06 DIAGNOSIS — M6281 Muscle weakness (generalized): Secondary | ICD-10-CM | POA: Diagnosis not present

## 2019-01-06 DIAGNOSIS — M25562 Pain in left knee: Secondary | ICD-10-CM | POA: Diagnosis not present

## 2019-01-06 DIAGNOSIS — R2689 Other abnormalities of gait and mobility: Secondary | ICD-10-CM | POA: Diagnosis not present

## 2019-01-06 DIAGNOSIS — M25662 Stiffness of left knee, not elsewhere classified: Secondary | ICD-10-CM | POA: Diagnosis not present

## 2019-01-13 DIAGNOSIS — R2689 Other abnormalities of gait and mobility: Secondary | ICD-10-CM | POA: Diagnosis not present

## 2019-01-13 DIAGNOSIS — M6281 Muscle weakness (generalized): Secondary | ICD-10-CM | POA: Diagnosis not present

## 2019-01-13 DIAGNOSIS — M25662 Stiffness of left knee, not elsewhere classified: Secondary | ICD-10-CM | POA: Diagnosis not present

## 2019-01-13 DIAGNOSIS — M25562 Pain in left knee: Secondary | ICD-10-CM | POA: Diagnosis not present

## 2019-01-17 DIAGNOSIS — M6281 Muscle weakness (generalized): Secondary | ICD-10-CM | POA: Diagnosis not present

## 2019-01-17 DIAGNOSIS — M25562 Pain in left knee: Secondary | ICD-10-CM | POA: Diagnosis not present

## 2019-01-17 DIAGNOSIS — M25662 Stiffness of left knee, not elsewhere classified: Secondary | ICD-10-CM | POA: Diagnosis not present

## 2019-01-17 DIAGNOSIS — R2689 Other abnormalities of gait and mobility: Secondary | ICD-10-CM | POA: Diagnosis not present

## 2019-01-20 DIAGNOSIS — M6281 Muscle weakness (generalized): Secondary | ICD-10-CM | POA: Diagnosis not present

## 2019-01-20 DIAGNOSIS — M25562 Pain in left knee: Secondary | ICD-10-CM | POA: Diagnosis not present

## 2019-01-20 DIAGNOSIS — R2689 Other abnormalities of gait and mobility: Secondary | ICD-10-CM | POA: Diagnosis not present

## 2019-01-20 DIAGNOSIS — M25662 Stiffness of left knee, not elsewhere classified: Secondary | ICD-10-CM | POA: Diagnosis not present

## 2019-01-25 DIAGNOSIS — M25562 Pain in left knee: Secondary | ICD-10-CM | POA: Diagnosis not present

## 2019-01-25 DIAGNOSIS — M25662 Stiffness of left knee, not elsewhere classified: Secondary | ICD-10-CM | POA: Diagnosis not present

## 2019-01-25 DIAGNOSIS — M6281 Muscle weakness (generalized): Secondary | ICD-10-CM | POA: Diagnosis not present

## 2019-01-25 DIAGNOSIS — R2689 Other abnormalities of gait and mobility: Secondary | ICD-10-CM | POA: Diagnosis not present

## 2019-02-01 ENCOUNTER — Other Ambulatory Visit: Payer: Self-pay

## 2019-03-31 DIAGNOSIS — Z6835 Body mass index (BMI) 35.0-35.9, adult: Secondary | ICD-10-CM | POA: Diagnosis not present

## 2019-03-31 DIAGNOSIS — R300 Dysuria: Secondary | ICD-10-CM | POA: Diagnosis not present

## 2019-04-13 DIAGNOSIS — L578 Other skin changes due to chronic exposure to nonionizing radiation: Secondary | ICD-10-CM | POA: Diagnosis not present

## 2019-04-13 DIAGNOSIS — L570 Actinic keratosis: Secondary | ICD-10-CM | POA: Diagnosis not present

## 2019-04-13 DIAGNOSIS — D485 Neoplasm of uncertain behavior of skin: Secondary | ICD-10-CM | POA: Diagnosis not present

## 2019-04-13 DIAGNOSIS — L821 Other seborrheic keratosis: Secondary | ICD-10-CM | POA: Diagnosis not present

## 2019-05-23 DIAGNOSIS — M1712 Unilateral primary osteoarthritis, left knee: Secondary | ICD-10-CM | POA: Diagnosis not present

## 2019-06-15 ENCOUNTER — Other Ambulatory Visit: Payer: Self-pay | Admitting: Vascular Surgery

## 2019-06-15 DIAGNOSIS — Z1231 Encounter for screening mammogram for malignant neoplasm of breast: Secondary | ICD-10-CM

## 2019-08-11 DIAGNOSIS — H65191 Other acute nonsuppurative otitis media, right ear: Secondary | ICD-10-CM | POA: Diagnosis not present

## 2019-08-11 DIAGNOSIS — J208 Acute bronchitis due to other specified organisms: Secondary | ICD-10-CM | POA: Diagnosis not present

## 2019-08-11 DIAGNOSIS — J309 Allergic rhinitis, unspecified: Secondary | ICD-10-CM | POA: Diagnosis not present

## 2019-08-11 DIAGNOSIS — B9689 Other specified bacterial agents as the cause of diseases classified elsewhere: Secondary | ICD-10-CM | POA: Diagnosis not present

## 2019-08-17 ENCOUNTER — Ambulatory Visit: Payer: Medicare Other

## 2019-08-22 DIAGNOSIS — Z20822 Contact with and (suspected) exposure to covid-19: Secondary | ICD-10-CM | POA: Diagnosis not present

## 2019-09-01 ENCOUNTER — Other Ambulatory Visit: Payer: Self-pay | Admitting: Vascular Surgery

## 2019-09-01 ENCOUNTER — Other Ambulatory Visit: Payer: Self-pay

## 2019-09-01 ENCOUNTER — Ambulatory Visit
Admission: RE | Admit: 2019-09-01 | Discharge: 2019-09-01 | Disposition: A | Discharge status: Home / Self Care | Payer: Medicare Other | Source: Ambulatory Visit | Attending: Vascular Surgery | Admitting: Vascular Surgery

## 2019-09-01 DIAGNOSIS — N63 Unspecified lump in unspecified breast: Secondary | ICD-10-CM

## 2019-09-01 DIAGNOSIS — Z1231 Encounter for screening mammogram for malignant neoplasm of breast: Secondary | ICD-10-CM

## 2019-09-05 ENCOUNTER — Other Ambulatory Visit: Payer: Self-pay | Admitting: Hematology and Oncology

## 2019-09-05 DIAGNOSIS — N63 Unspecified lump in unspecified breast: Secondary | ICD-10-CM

## 2019-09-20 ENCOUNTER — Other Ambulatory Visit: Payer: Self-pay | Admitting: Hematology and Oncology

## 2019-09-20 ENCOUNTER — Other Ambulatory Visit: Payer: Self-pay

## 2019-09-20 ENCOUNTER — Ambulatory Visit
Admission: RE | Admit: 2019-09-20 | Discharge: 2019-09-20 | Disposition: A | Discharge status: Home / Self Care | Payer: Medicare Other | Source: Ambulatory Visit | Attending: Hematology and Oncology | Admitting: Hematology and Oncology

## 2019-09-20 DIAGNOSIS — N63 Unspecified lump in unspecified breast: Secondary | ICD-10-CM

## 2019-09-21 ENCOUNTER — Encounter (INDEPENDENT_AMBULATORY_CARE_PROVIDER_SITE_OTHER): Payer: Self-pay | Admitting: Ophthalmology

## 2019-09-21 ENCOUNTER — Ambulatory Visit (INDEPENDENT_AMBULATORY_CARE_PROVIDER_SITE_OTHER): Payer: Medicare Other | Admitting: Ophthalmology

## 2019-09-21 DIAGNOSIS — H35361 Drusen (degenerative) of macula, right eye: Secondary | ICD-10-CM

## 2019-09-21 DIAGNOSIS — H353122 Nonexudative age-related macular degeneration, left eye, intermediate dry stage: Secondary | ICD-10-CM

## 2019-09-21 DIAGNOSIS — Z961 Presence of intraocular lens: Secondary | ICD-10-CM

## 2019-09-21 DIAGNOSIS — H353114 Nonexudative age-related macular degeneration, right eye, advanced atrophic with subfoveal involvement: Secondary | ICD-10-CM

## 2019-09-21 DIAGNOSIS — H353113 Nonexudative age-related macular degeneration, right eye, advanced atrophic without subfoveal involvement: Secondary | ICD-10-CM

## 2019-09-21 HISTORY — DX: Nonexudative age-related macular degeneration, right eye, advanced atrophic without subfoveal involvement: H35.3113

## 2019-09-21 HISTORY — DX: Presence of intraocular lens: Z96.1

## 2019-09-21 HISTORY — DX: Nonexudative age-related macular degeneration, left eye, intermediate dry stage: H35.3122

## 2019-09-21 HISTORY — DX: Nonexudative age-related macular degeneration, right eye, advanced atrophic with subfoveal involvement: H35.3114

## 2019-09-21 HISTORY — DX: Drusen (degenerative) of macula, right eye: H35.361

## 2019-09-21 NOTE — Assessment & Plan Note (Signed)
The nature of dry age related macular degeneration was discussed with the patient as well as its possible conversion to wet. The results of the AREDS 2 study was discussed with the patient. A diet rich in dark leafy green vegetables was advised and specific recommendations were made regarding supplements with AREDS 2 formulation . Control of hypertension and serum cholesterol may slow the disease. Smoking cessation is mandatory to slow the disease and diminish the risk of progressing to wet age related macular degeneration. The patient was instructed in the use of an [place] Grid and was told to return immediately for any changes in the Grid. Stressed to the patient do not rub eyes  The nature of age realated macular degeneration (ARMD)is explained as follows: The dry form refers to the progressive loss of normal blood supply to the central vision as a result of a combination of factors which include aging blood supply (arteriosclerosis, hardening of the arteries), genetics, smoking habits, and history of hypertension. Currently, no eye medications or vitamins slow this type of aging effect upon vision, however cessation of smoking and controlling hypertension help slow the disorder. The following analogy helps explain this: I describe the dry form of ARMD like a house of the same age as your eyes, which shows typical wear and tear of age upon the house structure and appearance. Like the aging house which can fall down structurally, the dry form of ARMD can deteriorate the structure of the macula (center) of the retina, most often gradually and affect central vision tasks such as reading and driving. The wet form of ARMD refers to the development of abnormally growing blood vessels usually near or under the central vision, with potential risk of permanent visual changes or vision losses. This complication of the Dry form of ARMD may be moderately reduced by use of AREDS 2 formula multivitamins daily. I describe the  Wet form of ARMD as like the development of a fire in an aging house (DRY ARMD). It may develop suddenly, progress rapidly and be destructive based on where it starts and how big the fire is when found. In the eye, the house fire analogy refers to the abnormal blood vessels growing destructively near the central vison in the retina, or film of the eye. Halting the growth of blood vessels with laser (hot or cold) or injectable medications is best way "to put the fire out". Many patients will experience a stabilization or even improvement in vision with a treatment course, while others may still face a loss of vision. The use of injectable medications has revolutionized therapy and is currently the only proven therapy to provide the chance of stable or improved acuity from new and recent destructive wet ARMD. 

## 2019-09-21 NOTE — Progress Notes (Signed)
09/21/2019     CHIEF COMPLAINT Patient presents for Retina Follow Up   HISTORY OF PRESENT ILLNESS: Heather Chavez is a 74 y.o. female who presents to the clinic today for:   HPI    Retina Follow Up    Patient presents with  Other.  In both eyes.  Duration of 1 year.  Since onset it is stable.          Comments    1 year follow up - OCT OU Patient states that she has noticed a change in her vision in the past month. Patient states when she is reading, the letters are disappearing from the page.       Last edited by Gerda Diss on 09/21/2019 10:31 AM. (History)      Referring physician: Ronita Hipps, MD [place] [address],   HISTORICAL INFORMATION:   Selected notes from the MEDICAL RECORD NUMBER       CURRENT MEDICATIONS: No current outpatient medications on file. (Ophthalmic Drugs)   No current facility-administered medications for this visit. (Ophthalmic Drugs)   Current Outpatient Medications (Other)  Medication Sig  . budesonide-formoterol (SYMBICORT) 160-4.5 MCG/ACT inhaler Inhale 2 puffs into the lungs 2 (two) times daily.  . fluticasone (FLONASE) 50 MCG/ACT nasal spray fluticasone 50 mcg/actuation nasal spray,suspension  . gabapentin (NEURONTIN) 100 MG capsule 400 mg daily.  . hydrochlorothiazide (HYDRODIURIL) 25 MG tablet Take 25 mg by mouth daily.  Marland Kitchen losartan (COZAAR) 25 MG tablet Take 25 mg by mouth daily.  . meloxicam (MOBIC) 15 MG tablet   . omeprazole (PRILOSEC) 20 MG capsule Take 20 mg by mouth.  . tamoxifen (NOLVADEX) 10 MG tablet Take 10 mg by mouth 2 (two) times daily.   No current facility-administered medications for this visit. (Other)      REVIEW OF SYSTEMS:    ALLERGIES Allergies  Allergen Reactions  . Codeine Other (See Comments)  . Tylenol [Acetaminophen] Other (See Comments)    Unknown Unknown    PAST MEDICAL HISTORY Past Medical History:  Diagnosis Date  . Breast cancer ([place])   .  Bronchitis, chronic ([place])   . Cancer ([place])   . Personal history of chemotherapy   . Personal history of radiation therapy    Past Surgical History:  Procedure Laterality Date  . BREAST BIOPSY    . BREAST LUMPECTOMY     right brwast 2005/ left 2015    FAMILY HISTORY Family History  Problem Relation Age of Onset  . Cancer Mother 51       bilateral breast cancer ages 81 then 58. Peritoneal cancer at age 75.  . Other Mother        Ms. Summerson's mother was adopted so have no information regarding maternal family  history other than her mother  . Breast cancer Mother   . Cancer Father 74       renal  . Cancer Sister 32       breast  . Breast cancer Sister   . Cancer Paternal Aunt 18       breast  . Cancer Paternal Grandmother        bilateral breast cancer at unknown age    SOCIAL HISTORY Social History   Tobacco Use  . Smoking status: Former Smoker    Packs/day: 2.50    Years: 15.00    Pack years: 37.50    Types: Cigarettes    Quit date: 03/09/1981    Years since quitting: 38.5  .  Smokeless tobacco: Never Used  Vaping Use  . Vaping Use: Never used  Substance Use Topics  . Alcohol use: Yes  . Drug use: No         OPHTHALMIC EXAM:  Base Eye Exam    Visual Acuity (Snellen - Linear)      Right Left   Dist cc 20/70+2 20/40-2   Dist ph cc 20/60+2 20/30-1       Tonometry (Tonopen, 10:38 AM)      Right Left   Pressure 14 15       Pupils      Pupils Dark Light Shape React APD   Right PERRL 5 4 Round Brisk None   Left PERRL 5 4 Round Brisk None       Visual Fields (Counting fingers)      Left Right    Full Full       Extraocular Movement      Right Left    Full Full       Neuro/Psych    Oriented x3: Yes   Mood/Affect: Normal       Dilation    Both eyes: 1.0% Mydriacyl, 2.5% Phenylephrine @ 10:38 AM        Slit Lamp and Fundus Exam    External Exam      Right Left   External Normal Normal       Slit Lamp Exam      Right Left    Lids/Lashes Normal Normal   Conjunctiva/Sclera White and quiet White and quiet   Cornea Clear Clear   Anterior Chamber Deep and quiet Deep and quiet   Iris Round and reactive Round and reactive   Lens Posterior chamber intraocular lens Posterior chamber intraocular lens   Anterior Vitreous Normal Normal       Fundus Exam      Right Left   Posterior Vitreous Posterior vitreous detachment Posterior vitreous detachment   C/D Ratio 0.1 0.1   Macula Geographic atrophy, in the faz Retinal pigment epithelial mottling, Hard drusen, no macular thickening, no exudates          IMAGING AND PROCEDURES  Imaging and Procedures for 09/21/19  OCT, Retina - OU - Both Eyes       Right Eye Quality was good. Scan locations included subfoveal. Central Foveal Thickness: 234. Progression has worsened. Findings include outer retinal atrophy, retinal drusen , abnormal foveal contour.   Left Eye Quality was good. Scan locations included subfoveal. Central Foveal Thickness: 257. Progression has worsened. Findings include retinal drusen , abnormal foveal contour.   Notes Atrophy OD explains vision  OS with subfoveal drusen and pigmentary migration into the outer retina in the foveal region.  I explained this could be progression of atrophy as well.                ASSESSMENT/PLAN:  Advanced nonexudative age-related macular degeneration of right eye with subfoveal involvement The nature of dry age related macular degeneration was discussed with the patient as well as its possible conversion to wet. The results of the AREDS 2 study was discussed with the patient. A diet rich in dark leafy green vegetables was advised and specific recommendations were made regarding supplements with AREDS 2 formulation . Control of hypertension and serum cholesterol may slow the disease. Smoking cessation is mandatory to slow the disease and diminish the risk of progressing to wet age related macular degeneration.  The patient was instructed in the use of an [place]  and was told to return immediately for any changes in the Grid. Stressed to the patient do not rub eyes  The nature of age realated macular degeneration (ARMD)is explained as follows: The dry form refers to the progressive loss of normal blood supply to the central vision as a result of a combination of factors which include aging blood supply (arteriosclerosis, hardening of the arteries), genetics, smoking habits, and history of hypertension. Currently, no eye medications or vitamins slow this type of aging effect upon vision, however cessation of smoking and controlling hypertension help slow the disorder. The following analogy helps explain this: I describe the dry form of ARMD like a house of the same age as your eyes, which shows typical wear and tear of age upon the house structure and appearance. Like the aging house which can fall down structurally, the dry form of ARMD can deteriorate the structure of the macula (center) of the retina, most often gradually and affect central vision tasks such as reading and driving. The wet form of ARMD refers to the development of abnormally growing blood vessels usually near or under the central vision, with potential risk of permanent visual changes or vision losses. This complication of the Dry form of ARMD may be moderately reduced by use of AREDS 2 formula multivitamins daily. I describe the Wet form of ARMD as like the development of a fire in an aging house (DRY ARMD). It may develop suddenly, progress rapidly and be destructive based on where it starts and how big the fire is when found. In the eye, the house fire analogy refers to the abnormal blood vessels growing destructively near the central vison in the retina, or film of the eye. Halting the growth of blood vessels with laser (hot or cold) or injectable medications is best way "to put the fire out". Many patients will experience a stabilization or  even improvement in vision with a treatment course, while others may still face a loss of vision. The use of injectable medications has revolutionized therapy and is currently the only proven therapy to provide the chance of stable or improved acuity from new and recent destructive wet ARMD.      ICD-10-CM   1. Advanced nonexudative age-related macular degeneration of right eye with subfoveal involvement  H35.3114 OCT, Retina - OU - Both Eyes  2. Advanced nonexudative age-related macular degeneration of right eye without subfoveal involvement  H35.3113   3. Intermediate stage nonexudative age-related macular degeneration of left eye  H35.3122 OCT, Retina - OU - Both Eyes  4. Pseudophakia  Z96.1   5. Degenerative retinal drusen of right eye  H35.361     1. Discussed Dry ARMD at length.  2. ROS positive for sleep apnea, pt to follow up with PCP for sleep testing.  Patient has nocturia at least twice or more times per night, only sleeps 2 to 3 hours straight, does have some history of snoring, and does at times want to take naps the next day  3.  I have encouraged patient to be tested for sleep apnea due to the fact that all periods of the day and night require maximize oxygenation for the health of the avascular foveal region.  Ophthalmic Meds Ordered this visit:  No orders of the defined types were placed in this encounter.      Return in about 6 months (around 03/23/2020) for DILATE OU, OCT.  There are no Patient Instructions on file for this visit.   Explained the diagnoses,  plan, and follow up with the patient and they expressed understanding.  Patient expressed understanding of the importance of proper follow up care.   Clent Demark Shernell Saldierna M.D. Diseases & Surgery of the Retina and Vitreous Retina & Diabetic [place] 09/21/19     Abbreviations: M myopia (nearsighted); A astigmatism; H hyperopia (farsighted); P presbyopia; Mrx spectacle prescription;  CTL contact lenses; OD  right eye; OS left eye; OU both eyes  XT exotropia; ET esotropia; PEK punctate epithelial keratitis; PEE punctate epithelial erosions; DES dry eye syndrome; MGD meibomian gland dysfunction; ATs artificial tears; PFAT's preservative free artificial tears; [place] nuclear sclerotic cataract; PSC posterior subcapsular cataract; ERM epi-retinal membrane; PVD posterior vitreous detachment; RD retinal detachment; DM diabetes mellitus; DR diabetic retinopathy; NPDR non-proliferative diabetic retinopathy; PDR proliferative diabetic retinopathy; CSME clinically significant macular edema; DME diabetic macular edema; dbh dot blot hemorrhages; CWS cotton wool spot; POAG primary open angle glaucoma; C/D cup-to-disc ratio; HVF humphrey visual field; GVF goldmann visual field; OCT optical coherence tomography; IOP intraocular pressure; BRVO Branch retinal vein occlusion; CRVO central retinal vein occlusion; CRAO central retinal artery occlusion; BRAO branch retinal artery occlusion; RT retinal tear; SB scleral buckle; PPV pars plana vitrectomy; VH Vitreous hemorrhage; PRP panretinal laser photocoagulation; IVK intravitreal kenalog; VMT vitreomacular traction; MH Macular hole;  NVD neovascularization of the disc; NVE neovascularization elsewhere; AREDS age related eye disease study; ARMD age related macular degeneration; POAG primary open angle glaucoma; EBMD epithelial/anterior basement membrane dystrophy; ACIOL anterior chamber intraocular lens; IOL intraocular lens; PCIOL posterior chamber intraocular lens; Phaco/IOL phacoemulsification with intraocular lens placement; [place] photorefractive keratectomy; LASIK laser assisted in situ keratomileusis; HTN hypertension; DM diabetes mellitus; COPD chronic obstructive pulmonary disease

## 2019-09-25 DIAGNOSIS — C50212 Malignant neoplasm of upper-inner quadrant of left female breast: Secondary | ICD-10-CM | POA: Diagnosis not present

## 2019-09-25 DIAGNOSIS — Z6834 Body mass index (BMI) 34.0-34.9, adult: Secondary | ICD-10-CM

## 2019-09-25 DIAGNOSIS — Z853 Personal history of malignant neoplasm of breast: Secondary | ICD-10-CM | POA: Diagnosis not present

## 2019-09-25 DIAGNOSIS — Z170 Estrogen receptor positive status [ER+]: Secondary | ICD-10-CM | POA: Diagnosis not present

## 2019-09-25 HISTORY — DX: Body mass index (BMI) 34.0-34.9, adult: Z68.34

## 2019-10-10 DIAGNOSIS — H04123 Dry eye syndrome of bilateral lacrimal glands: Secondary | ICD-10-CM | POA: Diagnosis not present

## 2019-10-10 DIAGNOSIS — H353134 Nonexudative age-related macular degeneration, bilateral, advanced atrophic with subfoveal involvement: Secondary | ICD-10-CM | POA: Diagnosis not present

## 2019-10-10 DIAGNOSIS — H52203 Unspecified astigmatism, bilateral: Secondary | ICD-10-CM | POA: Diagnosis not present

## 2019-10-10 DIAGNOSIS — H5201 Hypermetropia, right eye: Secondary | ICD-10-CM | POA: Diagnosis not present

## 2019-10-10 DIAGNOSIS — H2181 Floppy iris syndrome: Secondary | ICD-10-CM | POA: Diagnosis not present

## 2019-10-10 DIAGNOSIS — Z961 Presence of intraocular lens: Secondary | ICD-10-CM | POA: Diagnosis not present

## 2019-10-10 DIAGNOSIS — H524 Presbyopia: Secondary | ICD-10-CM | POA: Diagnosis not present

## 2019-10-12 DIAGNOSIS — L821 Other seborrheic keratosis: Secondary | ICD-10-CM | POA: Diagnosis not present

## 2019-10-12 DIAGNOSIS — L578 Other skin changes due to chronic exposure to nonionizing radiation: Secondary | ICD-10-CM | POA: Diagnosis not present

## 2019-10-12 DIAGNOSIS — L570 Actinic keratosis: Secondary | ICD-10-CM | POA: Diagnosis not present

## 2019-10-14 DIAGNOSIS — Z20822 Contact with and (suspected) exposure to covid-19: Secondary | ICD-10-CM | POA: Diagnosis not present

## 2019-10-16 DIAGNOSIS — J40 Bronchitis, not specified as acute or chronic: Secondary | ICD-10-CM | POA: Diagnosis not present

## 2019-10-16 DIAGNOSIS — J329 Chronic sinusitis, unspecified: Secondary | ICD-10-CM | POA: Diagnosis not present

## 2019-10-24 DIAGNOSIS — Z6835 Body mass index (BMI) 35.0-35.9, adult: Secondary | ICD-10-CM | POA: Diagnosis not present

## 2019-10-24 DIAGNOSIS — Z0000 Encounter for general adult medical examination without abnormal findings: Secondary | ICD-10-CM | POA: Diagnosis not present

## 2019-10-24 DIAGNOSIS — B028 Zoster with other complications: Secondary | ICD-10-CM | POA: Diagnosis not present

## 2019-10-24 DIAGNOSIS — H6980 Other specified disorders of Eustachian tube, unspecified ear: Secondary | ICD-10-CM | POA: Diagnosis not present

## 2019-11-08 DIAGNOSIS — Z20828 Contact with and (suspected) exposure to other viral communicable diseases: Secondary | ICD-10-CM | POA: Diagnosis not present

## 2019-11-08 DIAGNOSIS — J180 Bronchopneumonia, unspecified organism: Secondary | ICD-10-CM | POA: Diagnosis not present

## 2019-11-17 DIAGNOSIS — J9801 Acute bronchospasm: Secondary | ICD-10-CM | POA: Diagnosis not present

## 2019-11-17 DIAGNOSIS — J189 Pneumonia, unspecified organism: Secondary | ICD-10-CM | POA: Diagnosis not present

## 2019-11-23 DIAGNOSIS — R062 Wheezing: Secondary | ICD-10-CM | POA: Diagnosis not present

## 2019-11-23 DIAGNOSIS — M1712 Unilateral primary osteoarthritis, left knee: Secondary | ICD-10-CM | POA: Diagnosis not present

## 2019-11-23 DIAGNOSIS — E1165 Type 2 diabetes mellitus with hyperglycemia: Secondary | ICD-10-CM | POA: Diagnosis not present

## 2019-11-23 DIAGNOSIS — Z6834 Body mass index (BMI) 34.0-34.9, adult: Secondary | ICD-10-CM | POA: Diagnosis not present

## 2019-12-07 DIAGNOSIS — F331 Major depressive disorder, recurrent, moderate: Secondary | ICD-10-CM | POA: Diagnosis not present

## 2019-12-07 DIAGNOSIS — K219 Gastro-esophageal reflux disease without esophagitis: Secondary | ICD-10-CM | POA: Diagnosis not present

## 2019-12-07 DIAGNOSIS — I10 Essential (primary) hypertension: Secondary | ICD-10-CM | POA: Diagnosis not present

## 2019-12-14 DIAGNOSIS — M9905 Segmental and somatic dysfunction of pelvic region: Secondary | ICD-10-CM | POA: Diagnosis not present

## 2019-12-14 DIAGNOSIS — M9902 Segmental and somatic dysfunction of thoracic region: Secondary | ICD-10-CM | POA: Diagnosis not present

## 2019-12-14 DIAGNOSIS — M9903 Segmental and somatic dysfunction of lumbar region: Secondary | ICD-10-CM | POA: Diagnosis not present

## 2019-12-14 DIAGNOSIS — M5451 Vertebrogenic low back pain: Secondary | ICD-10-CM | POA: Diagnosis not present

## 2019-12-14 DIAGNOSIS — M62830 Muscle spasm of back: Secondary | ICD-10-CM | POA: Diagnosis not present

## 2020-01-09 DIAGNOSIS — Z23 Encounter for immunization: Secondary | ICD-10-CM | POA: Diagnosis not present

## 2020-01-20 DIAGNOSIS — Z23 Encounter for immunization: Secondary | ICD-10-CM | POA: Diagnosis not present

## 2020-03-25 ENCOUNTER — Encounter (INDEPENDENT_AMBULATORY_CARE_PROVIDER_SITE_OTHER): Payer: Medicare Other | Admitting: Ophthalmology

## 2020-03-26 ENCOUNTER — Other Ambulatory Visit: Payer: Self-pay

## 2020-03-26 ENCOUNTER — Encounter (INDEPENDENT_AMBULATORY_CARE_PROVIDER_SITE_OTHER): Payer: Self-pay | Admitting: Ophthalmology

## 2020-03-26 ENCOUNTER — Ambulatory Visit (INDEPENDENT_AMBULATORY_CARE_PROVIDER_SITE_OTHER): Payer: Medicare Other | Admitting: Ophthalmology

## 2020-03-26 DIAGNOSIS — H353122 Nonexudative age-related macular degeneration, left eye, intermediate dry stage: Secondary | ICD-10-CM

## 2020-03-26 DIAGNOSIS — H353113 Nonexudative age-related macular degeneration, right eye, advanced atrophic without subfoveal involvement: Secondary | ICD-10-CM | POA: Diagnosis not present

## 2020-03-26 DIAGNOSIS — H353114 Nonexudative age-related macular degeneration, right eye, advanced atrophic with subfoveal involvement: Secondary | ICD-10-CM

## 2020-03-26 NOTE — Progress Notes (Signed)
03/26/2020     CHIEF COMPLAINT Patient presents for Retina Follow Up (6 MO FU OU///Pt reports blurry vision especially while reading OU, no new F/F OU, no pain or pressure OU. )   HISTORY OF PRESENT ILLNESS: Heather Chavez is a 75 y.o. female who presents to the clinic today for:   HPI    Retina Follow Up    Patient presents with  Wet AMD.  In right eye.  This started 6 months ago.  Duration of 6 months. Additional comments: 6 MO FU OU   Pt reports blurry vision especially while reading OU, no new F/F OU, no pain or pressure OU.        Last edited by Nichola Sizer D on 03/26/2020  9:44 AM. (History)      Referring physician: Ronita Hipps, MD [place] [address],   HISTORICAL INFORMATION:   Selected notes from the MEDICAL RECORD NUMBER       CURRENT MEDICATIONS: No current outpatient medications on file. (Ophthalmic Drugs)   No current facility-administered medications for this visit. (Ophthalmic Drugs)   Current Outpatient Medications (Other)  Medication Sig  . budesonide-formoterol (SYMBICORT) 160-4.5 MCG/ACT inhaler Inhale 2 puffs into the lungs 2 (two) times daily.  . fluticasone (FLONASE) 50 MCG/ACT nasal spray fluticasone 50 mcg/actuation nasal spray,suspension  . gabapentin (NEURONTIN) 100 MG capsule 400 mg daily.  . hydrochlorothiazide (HYDRODIURIL) 25 MG tablet Take 25 mg by mouth daily.  Marland Kitchen losartan (COZAAR) 25 MG tablet Take 25 mg by mouth daily.  . meloxicam (MOBIC) 15 MG tablet   . omeprazole (PRILOSEC) 20 MG capsule Take 20 mg by mouth.  . tamoxifen (NOLVADEX) 10 MG tablet Take 10 mg by mouth 2 (two) times daily.   No current facility-administered medications for this visit. (Other)      REVIEW OF SYSTEMS:    ALLERGIES Allergies  Allergen Reactions  . Codeine Other (See Comments)  . Tylenol [Acetaminophen] Other (See Comments)    Unknown Unknown    PAST MEDICAL HISTORY Past Medical History:  Diagnosis  Date  . Breast cancer ([place])   . Bronchitis, chronic ([place])   . Cancer ([place])   . Personal history of chemotherapy   . Personal history of radiation therapy    Past Surgical History:  Procedure Laterality Date  . BREAST BIOPSY    . BREAST LUMPECTOMY     right brwast 2005/ left 2015    FAMILY HISTORY Family History  Problem Relation Age of Onset  . Cancer Mother 50       bilateral breast cancer ages 28 then 1. Peritoneal cancer at age 42.  . Other Mother        Ms. Lira's mother was adopted so have no information regarding maternal family  history other than her mother  . Breast cancer Mother   . Cancer Father 41       renal  . Cancer Sister 36       breast  . Breast cancer Sister   . Cancer Paternal Aunt 15       breast  . Cancer Paternal Grandmother        bilateral breast cancer at unknown age    SOCIAL HISTORY Social History   Tobacco Use  . Smoking status: Former Smoker    Packs/day: 2.50    Years: 15.00    Pack years: 37.50    Types: Cigarettes    Quit date: 03/09/1981    Years since  quitting: 39.0  . Smokeless tobacco: Never Used  Vaping Use  . Vaping Use: Never used  Substance Use Topics  . Alcohol use: Yes  . Drug use: No         OPHTHALMIC EXAM: Base Eye Exam    Visual Acuity (ETDRS)      Right Left   Dist cc 20/60 +2 20/30 -2   Dist ph cc NI 20/30 +1   Correction: Glasses       Tonometry (Tonopen, 9:51 AM)      Right Left   Pressure 12 12       Pupils      Pupils Dark Light Shape React APD   Right PERRL 5 4 Round Brisk None   Left PERRL 5 4 Round Brisk None       Visual Fields (Counting fingers)      Left Right    Full Full       Extraocular Movement      Right Left    Full Full       Neuro/Psych    Oriented x3: Yes   Mood/Affect: Normal       Dilation    Both eyes: 1.0% Mydriacyl, 2.5% Phenylephrine @ 9:51 AM        Slit Lamp and Fundus Exam    External Exam      Right Left   External Normal Normal        Slit Lamp Exam      Right Left   Lids/Lashes Normal Normal   Conjunctiva/Sclera White and quiet White and quiet   Cornea Clear Clear   Anterior Chamber Deep and quiet Deep and quiet   Iris Round and reactive Round and reactive   Lens Posterior chamber intraocular lens Posterior chamber intraocular lens   Anterior Vitreous Normal Normal       Fundus Exam      Right Left   Posterior Vitreous Posterior vitreous detachment Posterior vitreous detachment   Disc Normal Normal   C/D Ratio 0.1 0.1   Macula Geographic atrophy, in the faz, no macular thickening Retinal pigment epithelial mottling, Hard drusen, no macular thickening, no exudates, Geographic atrophy subfoveal   Vessels Normal Normal   Periphery Normal Normal          IMAGING AND PROCEDURES  Imaging and Procedures for 03/26/20  OCT, Retina - OU - Both Eyes       Right Eye Quality was good. Scan locations included subfoveal. Central Foveal Thickness: 235. Progression has worsened. Findings include abnormal foveal contour, subretinal scarring, central retinal atrophy.   Left Eye Quality was good. Scan locations included subfoveal. Central Foveal Thickness: 255. Progression has been stable. Findings include abnormal foveal contour, subretinal scarring, central retinal atrophy.   Notes Central outer atrophy, loss of the easy and photoreceptor layer OU worse OD                ASSESSMENT/PLAN:  Advanced nonexudative age-related macular degeneration of right eye with subfoveal involvement Profound and progressive vision loss.        ICD-10-CM   1. Advanced nonexudative age-related macular degeneration of right eye without subfoveal involvement  H35.3113 OCT, Retina - OU - Both Eyes  2. Intermediate stage nonexudative age-related macular degeneration of left eye  H35.3122 OCT, Retina - OU - Both Eyes  3. Advanced nonexudative age-related macular degeneration of right eye with subfoveal involvement  H35.3114      1.  2.  3.  Ophthalmic Meds  Ordered this visit:  No orders of the defined types were placed in this encounter.      Return in about 6 months (around 09/23/2020) for DILATE OU, OCT.  Patient Instructions  I encouraged patient to again follow-up with formal sleep study testing.  I informed the patient that they have home sleep studies now.  Patient does have residual review of systems positive for sleep apnea with awakening at least 3 times    Explained the diagnoses, plan, and follow up with the patient and they expressed understanding.  Patient expressed understanding of the importance of proper follow up care.   Clent Demark Abhi Moccia M.D. Diseases & Surgery of the Retina and Vitreous Retina & Diabetic [place] 03/26/20     Abbreviations: M myopia (nearsighted); A astigmatism; H hyperopia (farsighted); P presbyopia; Mrx spectacle prescription;  CTL contact lenses; OD right eye; OS left eye; OU both eyes  XT exotropia; ET esotropia; PEK punctate epithelial keratitis; PEE punctate epithelial erosions; DES dry eye syndrome; MGD meibomian gland dysfunction; ATs artificial tears; PFAT's preservative free artificial tears; [place] nuclear sclerotic cataract; PSC posterior subcapsular cataract; ERM epi-retinal membrane; PVD posterior vitreous detachment; RD retinal detachment; DM diabetes mellitus; DR diabetic retinopathy; NPDR non-proliferative diabetic retinopathy; PDR proliferative diabetic retinopathy; CSME clinically significant macular edema; DME diabetic macular edema; dbh dot blot hemorrhages; CWS cotton wool spot; POAG primary open angle glaucoma; C/D cup-to-disc ratio; HVF humphrey visual field; GVF goldmann visual field; OCT optical coherence tomography; IOP intraocular pressure; BRVO Branch retinal vein occlusion; CRVO central retinal vein occlusion; CRAO central retinal artery occlusion; BRAO branch retinal artery occlusion; RT retinal tear; SB scleral buckle; PPV pars plana  vitrectomy; VH Vitreous hemorrhage; PRP panretinal laser photocoagulation; IVK intravitreal kenalog; VMT vitreomacular traction; MH Macular hole;  NVD neovascularization of the disc; NVE neovascularization elsewhere; AREDS age related eye disease study; ARMD age related macular degeneration; POAG primary open angle glaucoma; EBMD epithelial/anterior basement membrane dystrophy; ACIOL anterior chamber intraocular lens; IOL intraocular lens; PCIOL posterior chamber intraocular lens; Phaco/IOL phacoemulsification with intraocular lens placement; [place] photorefractive keratectomy; LASIK laser assisted in situ keratomileusis; HTN hypertension; DM diabetes mellitus; COPD chronic obstructive pulmonary disease

## 2020-03-26 NOTE — Patient Instructions (Signed)
I encouraged patient to again follow-up with formal sleep study testing.  I informed the patient that they have home sleep studies now.  Patient does have residual review of systems positive for sleep apnea with awakening at least 3 times

## 2020-03-26 NOTE — Assessment & Plan Note (Signed)
Profound and progressive vision loss.

## 2020-04-08 DIAGNOSIS — R091 Pleurisy: Secondary | ICD-10-CM | POA: Diagnosis not present

## 2020-04-08 DIAGNOSIS — I10 Essential (primary) hypertension: Secondary | ICD-10-CM | POA: Diagnosis not present

## 2020-04-08 DIAGNOSIS — E1165 Type 2 diabetes mellitus with hyperglycemia: Secondary | ICD-10-CM | POA: Diagnosis not present

## 2020-04-15 DIAGNOSIS — L821 Other seborrheic keratosis: Secondary | ICD-10-CM | POA: Diagnosis not present

## 2020-04-15 DIAGNOSIS — L578 Other skin changes due to chronic exposure to nonionizing radiation: Secondary | ICD-10-CM | POA: Diagnosis not present

## 2020-04-15 DIAGNOSIS — L570 Actinic keratosis: Secondary | ICD-10-CM | POA: Diagnosis not present

## 2020-04-19 DIAGNOSIS — R0789 Other chest pain: Secondary | ICD-10-CM | POA: Diagnosis not present

## 2020-04-19 DIAGNOSIS — R0600 Dyspnea, unspecified: Secondary | ICD-10-CM | POA: Diagnosis not present

## 2020-04-22 DIAGNOSIS — M1711 Unilateral primary osteoarthritis, right knee: Secondary | ICD-10-CM | POA: Diagnosis not present

## 2020-04-22 DIAGNOSIS — Z79899 Other long term (current) drug therapy: Secondary | ICD-10-CM | POA: Diagnosis not present

## 2020-04-22 DIAGNOSIS — I10 Essential (primary) hypertension: Secondary | ICD-10-CM | POA: Diagnosis not present

## 2020-04-22 DIAGNOSIS — I2510 Atherosclerotic heart disease of native coronary artery without angina pectoris: Secondary | ICD-10-CM | POA: Diagnosis not present

## 2020-04-22 DIAGNOSIS — J439 Emphysema, unspecified: Secondary | ICD-10-CM | POA: Diagnosis not present

## 2020-04-25 DIAGNOSIS — M9903 Segmental and somatic dysfunction of lumbar region: Secondary | ICD-10-CM | POA: Diagnosis not present

## 2020-04-25 DIAGNOSIS — M5451 Vertebrogenic low back pain: Secondary | ICD-10-CM | POA: Diagnosis not present

## 2020-04-25 DIAGNOSIS — M9902 Segmental and somatic dysfunction of thoracic region: Secondary | ICD-10-CM | POA: Diagnosis not present

## 2020-04-25 DIAGNOSIS — M9905 Segmental and somatic dysfunction of pelvic region: Secondary | ICD-10-CM | POA: Diagnosis not present

## 2020-04-25 DIAGNOSIS — M62830 Muscle spasm of back: Secondary | ICD-10-CM | POA: Diagnosis not present

## 2020-04-30 DIAGNOSIS — J189 Pneumonia, unspecified organism: Secondary | ICD-10-CM | POA: Diagnosis not present

## 2020-05-02 DIAGNOSIS — J189 Pneumonia, unspecified organism: Secondary | ICD-10-CM | POA: Diagnosis not present

## 2020-05-02 DIAGNOSIS — R0602 Shortness of breath: Secondary | ICD-10-CM | POA: Diagnosis not present

## 2020-05-10 DIAGNOSIS — J439 Emphysema, unspecified: Secondary | ICD-10-CM | POA: Diagnosis not present

## 2020-05-10 DIAGNOSIS — Z6835 Body mass index (BMI) 35.0-35.9, adult: Secondary | ICD-10-CM | POA: Diagnosis not present

## 2020-05-10 DIAGNOSIS — I700 Atherosclerosis of aorta: Secondary | ICD-10-CM | POA: Diagnosis not present

## 2020-05-15 DIAGNOSIS — R000 Tachycardia, unspecified: Secondary | ICD-10-CM | POA: Diagnosis not present

## 2020-05-15 DIAGNOSIS — J441 Chronic obstructive pulmonary disease with (acute) exacerbation: Secondary | ICD-10-CM | POA: Diagnosis not present

## 2020-05-15 DIAGNOSIS — R0602 Shortness of breath: Secondary | ICD-10-CM | POA: Diagnosis not present

## 2020-05-15 DIAGNOSIS — Z20828 Contact with and (suspected) exposure to other viral communicable diseases: Secondary | ICD-10-CM | POA: Diagnosis not present

## 2020-05-16 ENCOUNTER — Other Ambulatory Visit: Payer: Self-pay

## 2020-05-16 DIAGNOSIS — J4489 Other specified chronic obstructive pulmonary disease: Secondary | ICD-10-CM | POA: Insufficient documentation

## 2020-05-16 DIAGNOSIS — Z923 Personal history of irradiation: Secondary | ICD-10-CM | POA: Insufficient documentation

## 2020-05-16 DIAGNOSIS — I2510 Atherosclerotic heart disease of native coronary artery without angina pectoris: Secondary | ICD-10-CM | POA: Insufficient documentation

## 2020-05-16 DIAGNOSIS — I10 Essential (primary) hypertension: Secondary | ICD-10-CM | POA: Insufficient documentation

## 2020-05-16 DIAGNOSIS — F329 Major depressive disorder, single episode, unspecified: Secondary | ICD-10-CM | POA: Insufficient documentation

## 2020-05-16 DIAGNOSIS — R0789 Other chest pain: Secondary | ICD-10-CM | POA: Insufficient documentation

## 2020-05-16 DIAGNOSIS — K5790 Diverticulosis of intestine, part unspecified, without perforation or abscess without bleeding: Secondary | ICD-10-CM | POA: Insufficient documentation

## 2020-05-16 DIAGNOSIS — J439 Emphysema, unspecified: Secondary | ICD-10-CM | POA: Insufficient documentation

## 2020-05-16 DIAGNOSIS — K219 Gastro-esophageal reflux disease without esophagitis: Secondary | ICD-10-CM | POA: Insufficient documentation

## 2020-05-16 DIAGNOSIS — C801 Malignant (primary) neoplasm, unspecified: Secondary | ICD-10-CM | POA: Insufficient documentation

## 2020-05-16 DIAGNOSIS — R509 Fever, unspecified: Secondary | ICD-10-CM | POA: Diagnosis not present

## 2020-05-16 DIAGNOSIS — J42 Unspecified chronic bronchitis: Secondary | ICD-10-CM | POA: Insufficient documentation

## 2020-05-16 DIAGNOSIS — J309 Allergic rhinitis, unspecified: Secondary | ICD-10-CM | POA: Insufficient documentation

## 2020-05-16 DIAGNOSIS — E669 Obesity, unspecified: Secondary | ICD-10-CM | POA: Insufficient documentation

## 2020-05-16 DIAGNOSIS — Z9221 Personal history of antineoplastic chemotherapy: Secondary | ICD-10-CM | POA: Insufficient documentation

## 2020-05-16 DIAGNOSIS — R0600 Dyspnea, unspecified: Secondary | ICD-10-CM | POA: Insufficient documentation

## 2020-05-16 DIAGNOSIS — C50919 Malignant neoplasm of unspecified site of unspecified female breast: Secondary | ICD-10-CM | POA: Insufficient documentation

## 2020-05-17 ENCOUNTER — Other Ambulatory Visit: Payer: Self-pay

## 2020-05-17 ENCOUNTER — Encounter: Payer: Self-pay | Admitting: Gastroenterology

## 2020-05-17 ENCOUNTER — Ambulatory Visit (INDEPENDENT_AMBULATORY_CARE_PROVIDER_SITE_OTHER): Payer: Medicare Other | Admitting: Cardiology

## 2020-05-17 ENCOUNTER — Encounter: Payer: Self-pay | Admitting: Cardiology

## 2020-05-17 DIAGNOSIS — I2510 Atherosclerotic heart disease of native coronary artery without angina pectoris: Secondary | ICD-10-CM | POA: Diagnosis not present

## 2020-05-17 DIAGNOSIS — R011 Cardiac murmur, unspecified: Secondary | ICD-10-CM | POA: Insufficient documentation

## 2020-05-17 DIAGNOSIS — E669 Obesity, unspecified: Secondary | ICD-10-CM | POA: Diagnosis not present

## 2020-05-17 DIAGNOSIS — I259 Chronic ischemic heart disease, unspecified: Secondary | ICD-10-CM

## 2020-05-17 DIAGNOSIS — E088 Diabetes mellitus due to underlying condition with unspecified complications: Secondary | ICD-10-CM | POA: Diagnosis not present

## 2020-05-17 DIAGNOSIS — I209 Angina pectoris, unspecified: Secondary | ICD-10-CM

## 2020-05-17 DIAGNOSIS — E782 Mixed hyperlipidemia: Secondary | ICD-10-CM | POA: Diagnosis not present

## 2020-05-17 DIAGNOSIS — I10 Essential (primary) hypertension: Secondary | ICD-10-CM | POA: Diagnosis not present

## 2020-05-17 DIAGNOSIS — I2584 Coronary atherosclerosis due to calcified coronary lesion: Secondary | ICD-10-CM

## 2020-05-17 HISTORY — DX: Essential (primary) hypertension: I10

## 2020-05-17 HISTORY — DX: Diabetes mellitus due to underlying condition with unspecified complications: E08.8

## 2020-05-17 HISTORY — DX: Angina pectoris, unspecified: I20.9

## 2020-05-17 HISTORY — DX: Mixed hyperlipidemia: E78.2

## 2020-05-17 HISTORY — DX: Obesity, unspecified: E66.9

## 2020-05-17 HISTORY — DX: Atherosclerotic heart disease of native coronary artery without angina pectoris: I25.10

## 2020-05-17 HISTORY — DX: Cardiac murmur, unspecified: R01.1

## 2020-05-17 MED ORDER — NITROGLYCERIN 0.4 MG SL SUBL: 0.4000 mg | SUBLINGUAL_TABLET | SUBLINGUAL | 6 refills | Status: DC | PRN

## 2020-05-17 MED ORDER — METOPROLOL SUCCINATE ER 25 MG PO TB24: 25.0000 mg | ORAL_TABLET | Freq: Every day | ORAL | 6 refills | Status: DC

## 2020-05-17 MED ORDER — ASPIRIN EC 81 MG PO TBEC: 81.0000 mg | DELAYED_RELEASE_TABLET | Freq: Every day | ORAL | 3 refills | Status: DC

## 2020-05-17 NOTE — Patient Instructions (Signed)
Medication Instructions:  Your physician has recommended you make the following change in your medication:   Take 81 mg coated aspirin daily. Use nitroglycerin as needed for chest pain Take metoprolol succinate 25 mg daily.  *If you need a refill on your cardiac medications before your next appointment, please call your pharmacy*   Lab Work: Your physician recommends that you return for lab work in: 1 week before your CT scan. You need a BMET.  If you have labs (blood work) drawn today and your tests are completely normal, you will receive your results only by: Marland Kitchen MyChart Message (if you have MyChart) OR . A paper copy in the mail If you have any lab test that is abnormal or we need to change your treatment, we will call you to review the results.   Testing/Procedures: Your cardiac CT will be scheduled at:   [facility] [place], [place] [address] [phone]   If scheduled at [facility], please arrive at the [facility] main entrance of [facility] 30 minutes prior to test start time. Proceed to the [facility] Radiology Department (first floor) to check-in and test prep.  Please follow these instructions carefully (unless otherwise directed):  Hold all erectile dysfunction medications at least 3 days (72 hrs) prior to test.  On the Night Before the Test: . Be sure to Drink plenty of water. . Do not consume any caffeinated/decaffeinated beverages or chocolate 12 hours prior to your test. . Do not take any antihistamines 12 hours prior to your test.   On the Day of the Test: . Drink plenty of water. Do not drink any water within one hour of the test. . Do not eat any food 4 hours prior to the test. . You may take your regular medications prior to the test.  . Take metoprolol (Toprol XL) two hours prior to test. . HOLD Furosemide/Hydrochlorothiazide morning of the test. . FEMALES- please wear underwire-free bra if  available        After the Test: . Drink plenty of water. . After receiving IV contrast, you may experience a mild flushed feeling. This is normal. . On occasion, you may experience a mild rash up to 24 hours after the test. This is not dangerous. If this occurs, you can take Benadryl 25 mg and increase your fluid intake. . If you experience trouble breathing, this can be serious. If it is severe call 911 IMMEDIATELY. If it is mild, please call our office. . If you take any of these medications: Glipizide/Metformin, Avandament, Glucavance, please do not take 48 hours after completing test unless otherwise instructed.   Once we have confirmed authorization from your insurance company, we will call you to set up a date and time for your test. Based on how quickly your insurance processes prior authorizations requests, please allow up to 4 weeks to be contacted for scheduling your Cardiac CT appointment. Be advised that routine Cardiac CT appointments could be scheduled as many as 8 weeks after your provider has ordered it.  For non-scheduling related questions, please contact the cardiac imaging nurse navigator should you have any questions/concerns: Marchia Bond, Cardiac Imaging Nurse Navigator Burley Saver, Interim Cardiac Imaging Nurse [facility] Direct Office Dial: [phone]   For scheduling needs, including cancellations and rescheduling, please call Vivien Rota at [phone].     Follow-Up: At [facility], you and your health needs are our priority.  As part  of our continuing mission to provide you with exceptional heart care, we have created designated Provider Care Teams.  These Care Teams include your primary Cardiologist (physician) and Advanced Practice Providers (APPs -  Physician Assistants and Nurse Practitioners) who all work together to provide you with the care you need, when you need it.  We recommend signing up for the patient portal  called "MyChart".  Sign up information is provided on this After Visit Summary.  MyChart is used to connect with patients for Virtual Visits (Telemedicine).  Patients are able to view lab/test results, encounter notes, upcoming appointments, etc.  Non-urgent messages can be sent to your provider as well.   To learn more about what you can do with MyChart, go to [email or web address].    Your next appointment:   2 month(s)  The format for your next appointment:   In Person  Provider:   Jyl Heinz, MD   Other Instructions  Echocardiogram An echocardiogram is a test that uses sound waves (ultrasound) to produce images of the heart. Images from an echocardiogram can provide important information about:  Heart size and shape.  The size and thickness and movement of your heart's walls.  Heart muscle function and strength.  Heart valve function or if you have stenosis. Stenosis is when the heart valves are too narrow.  If blood is flowing backward through the heart valves (regurgitation).  A tumor or infectious growth around the heart valves.  Areas of heart muscle that are not working well because of poor blood flow or injury from a heart attack.  Aneurysm detection. An aneurysm is a weak or damaged part of an artery wall. The wall bulges out from the normal force of blood pumping through the body. Tell a health care provider about:  Any allergies you have.  All medicines you are taking, including vitamins, herbs, eye drops, creams, and over-the-counter medicines.  Any blood disorders you have.  Any surgeries you have had.  Any medical conditions you have.  Whether you are pregnant or may be pregnant. What are the risks? Generally, this is a safe test. However, problems may occur, including an allergic reaction to dye (contrast) that may be used during the test. What happens before the test? No specific preparation is needed. You may eat and drink normally. What  happens during the test?  You will take off your clothes from the waist up and put on a hospital gown.  Electrodes or electrocardiogram (ECG)patches may be placed on your chest. The electrodes or patches are then connected to a device that monitors your heart rate and rhythm.  You will lie down on a table for an ultrasound exam. A gel will be applied to your chest to help sound waves pass through your skin.  A handheld device, called a transducer, will be pressed against your chest and moved over your heart. The transducer produces sound waves that travel to your heart and bounce back (or "echo" back) to the transducer. These sound waves will be captured in real-time and changed into images of your heart that can be viewed on a video monitor. The images will be recorded on a computer and reviewed by your health care provider.  You may be asked to change positions or hold your breath for a short time. This makes it easier to get different views or better views of your heart.  In some cases, you may receive contrast through an IV in one of your veins. This can improve the  quality of the pictures from your heart. The procedure may vary among health care providers and hospitals.   What can I expect after the test? You may return to your normal, everyday life, including diet, activities, and medicines, unless your health care provider tells you not to do that. Follow these instructions at home:  It is up to you to get the results of your test. Ask your health care provider, or the department that is doing the test, when your results will be ready.  Keep all follow-up visits. This is important. Summary  An echocardiogram is a test that uses sound waves (ultrasound) to produce images of the heart.  Images from an echocardiogram can provide important information about the size and shape of your heart, heart muscle function, heart valve function, and other possible heart problems.  You do not need  to do anything to prepare before this test. You may eat and drink normally.  After the echocardiogram is completed, you may return to your normal, everyday life, unless your health care provider tells you not to do that. This information is not intended to replace advice given to you by your health care provider. Make sure you discuss any questions you have with your health care provider. Document Revised: 10/17/2019 Document Reviewed: 10/17/2019 Elsevier Patient Education  2021 [place].  Cardiac CT Angiogram A cardiac CT angiogram is a procedure to look at the heart and the area around the heart. It may be done to help find the cause of chest pains or other symptoms of heart disease. During this procedure, a substance called contrast dye is injected into the blood vessels in the area to be checked. A large X-ray machine, called a CT scanner, then takes detailed pictures of the heart and the surrounding area. The procedure is also sometimes called a coronary CT angiogram, coronary artery scanning, or CTA. A cardiac CT angiogram allows the health care provider to see how well blood is flowing to and from the heart. The health care provider will be able to see if there are any problems, such as:  Blockage or narrowing of the coronary arteries in the heart.  Fluid around the heart.  Signs of weakness or disease in the muscles, valves, and tissues of the heart. Tell a health care provider about:  Any allergies you have. This is especially important if you have had a previous allergic reaction to contrast dye.  All medicines you are taking, including vitamins, herbs, eye drops, creams, and over-the-counter medicines.  Any blood disorders you have.  Any surgeries you have had.  Any medical conditions you have.  Whether you are pregnant or may be pregnant.  Any anxiety disorders, chronic pain, or other conditions you have that may increase your stress or prevent you from lying  still. What are the risks? Generally, this is a safe procedure. However, problems may occur, including: 1. Bleeding. 2. Infection. 3. Allergic reactions to medicines or dyes. 4. Damage to other structures or organs. 5. Kidney damage from the contrast dye that is used. 6. Increased risk of cancer from radiation exposure. This risk is low. Talk with your health care provider about: ? The risks and benefits of testing. ? How you can receive the lowest dose of radiation. What happens before the procedure? 1. Wear comfortable clothing and remove any jewelry, glasses, dentures, and hearing aids. 2. Follow instructions from your health care provider about eating and drinking. This may include: ? For 12 hours before the procedure --  avoid caffeine. This includes tea, coffee, soda, energy drinks, and diet pills. Drink plenty of water or other fluids that do not have caffeine in them. Being well hydrated can prevent complications. ? For 4-6 hours before the procedure -- stop eating and drinking. The contrast dye can cause nausea, but this is less likely if your stomach is empty. 3. Ask your health care provider about changing or stopping your regular medicines. This is especially important if you are taking diabetes medicines, blood thinners, or medicines to treat problems with erections (erectile dysfunction). What happens during the procedure?  1. Hair on your chest may need to be removed so that small sticky patches called electrodes can be placed on your chest. These will transmit information that helps to monitor your heart during the procedure. 2. An IV will be inserted into one of your veins. 3. You might be given a medicine to control your heart rate during the procedure. This will help to ensure that good images are obtained. 4. You will be asked to lie on an exam table. This table will slide in and out of the CT machine during the procedure. 5. Contrast dye will be injected into the IV. You  might feel warm, or you may get a metallic taste in your mouth. 6. You will be given a medicine called nitroglycerin. This will relax or dilate the arteries in your heart. 7. The table that you are lying on will move into the CT machine tunnel for the scan. 8. The person running the machine will give you instructions while the scans are being done. You may be asked to: ? Keep your arms above your head. ? Hold your breath. ? Stay very still, even if the table is moving. 9. When the scanning is complete, you will be moved out of the machine. 10. The IV will be removed. The procedure may vary among health care providers and hospitals. What can I expect after the procedure? After your procedure, it is common to have:  A metallic taste in your mouth from the contrast dye.  A feeling of warmth.  A headache from the nitroglycerin. Follow these instructions at home:  Take over-the-counter and prescription medicines only as told by your health care provider.  If you are told, drink enough fluid to keep your urine pale yellow. This will help to flush the contrast dye out of your body.  Most people can return to their normal activities right after the procedure. Ask your health care provider what activities are safe for you.  It is up to you to get the results of your procedure. Ask your health care provider, or the department that is doing the procedure, when your results will be ready.  Keep all follow-up visits as told by your health care provider. This is important. Contact a health care provider if: 1. You have any symptoms of allergy to the contrast dye. These include: ? Shortness of breath. ? Rash or hives. ? A racing heartbeat. Summary  A cardiac CT angiogram is a procedure to look at the heart and the area around the heart. It may be done to help find the cause of chest pains or other symptoms of heart disease.  During this procedure, a large X-ray machine, called a CT scanner,  takes detailed pictures of the heart and the surrounding area after a contrast dye has been injected into blood vessels in the area.  Ask your health care provider about changing or stopping your regular  medicines before the procedure. This is especially important if you are taking diabetes medicines, blood thinners, or medicines to treat erectile dysfunction.  If you are told, drink enough fluid to keep your urine pale yellow. This will help to flush the contrast dye out of your body. This information is not intended to replace advice given to you by your health care provider. Make sure you discuss any questions you have with your health care provider. Document Revised: 10/19/2018 Document Reviewed: 10/19/2018 Elsevier Patient Education  [place].   Aspirin and Your Heart Aspirin is a medicine that prevents the platelets in your blood from sticking together. Platelets are the cells that your blood uses for clotting. Aspirin can be used to help reduce the risk of blood clots, heart attacks, and other heart-related problems. What are the risks? Daily use of aspirin can cause side effects. Some of these include:  Bleeding. Bleeding can be minor or serious. An example of minor bleeding is bleeding from a cut, and the bleeding does not stop. An example of more serious bleeding is stomach bleeding or, rarely, bleeding into the brain. Your risk of bleeding increases if you are also taking NSAIDs, such as ibuprofen.  Increased bruising.  Upset stomach.  An allergic reaction. People who have growths inside the nose (nasal polyps) have an increased risk of developing an aspirin allergy. How to use aspirin to care for your heart  Take aspirin only as told by your health care provider. Make sure that you understand how much to take and what form to take. The two forms of aspirin are: ? Non-enteric-coated.This type of aspirin does not have a coating and is absorbed quickly. This type of  aspirin also comes in a chewable form. ? Enteric-coated. This type of aspirin has a coating that releases the medicine very slowly. Enteric-coated aspirin might cause less stomach upset than non-enteric-coated aspirin. This type of aspirin should not be chewed or crushed.  Work with your health care provider to find out whether it is safe and beneficial for you to take aspirin daily. Taking aspirin daily may be helpful if: ? You have had a heart attack or chest pain, or you are at risk for a heart attack. ? You have a condition in which certain heart vessels are blocked (coronary artery disease), and you have had a procedure to treat it. Examples are:  Open-heart surgery, such as coronary artery bypass surgery (CABG).  Coronary angioplasty,which is done to widen a blood vessel of your heart.  Having a small mesh tube, or stent, placed in your coronary artery. ? You have had certain types of stroke or a mini-stroke known as a transient ischemic attack (TIA). ? You have a narrowing of the arteries that supply the limbs (peripheral artery disease, or PAD). ? You have long-term (chronic) heart rhythm problems, such as atrial fibrillation, and your health care provider thinks aspirin may help. ? You have valve disease or have had surgery on a valve. ? You are considered at increased risk of developing coronary artery disease or PAD.   Follow these instructions at home Medicines  Take over-the-counter and prescription medicines only as told by your health care provider.  If you are taking blood thinners: ? Talk with your health care provider before you take any medicines that contain aspirin or NSAIDs, such as ibuprofen. These medicines increase your risk for dangerous bleeding. ? Take your medicine exactly as told, at the same time every day. ? Avoid activities  that could cause injury or bruising, and follow instructions about how to prevent falls. ? Wear a medical alert bracelet or carry a card  that lists what medicines you take. General instructions  Do not drink alcohol if: ? Your health care provider tells you not to drink. ? You are pregnant, may be pregnant, or are planning to become pregnant.  If you drink alcohol: ? Limit how much you use to:  0-1 drink a day for women.  0-2 drinks a day for men. ? Be aware of how much alcohol is in your drink. In the [place] one drink equals one 12 oz bottle of beer (355 mL), one 5 oz glass of wine (148 mL), or one 1 oz glass of hard liquor (44 mL).  Keep all follow-up visits as told by your health care provider. This is important. Where to find more information  The American Heart Association: www.heart.org Contact a health care provider if you have:  Unusual bleeding or bruising.  Stomach pain or nausea.  Ringing in your ears.  An allergic reaction that causes hives, itchy skin, or swelling of the lips, tongue, or face. Get help right away if:  You notice that your bowel movements are bloody, or dark red or black in color.  You vomit or cough up blood.  You have blood in your urine.  You cough, breathe loudly (wheeze), or feel short of breath.  You have chest pain, especially if the pain spreads to your arms, back, neck, or jaw.  You have a headache with confusion. You have any symptoms of a stroke. "BE FAST" is an easy way to remember the main warning signs of a stroke:  B - Balance. Signs are dizziness, sudden trouble walking, or loss of balance.  E - Eyes. Signs are trouble seeing or a sudden change in vision.  F - Face. Signs are sudden weakness or numbness of the face, or the face or eyelid drooping on one side.  A - Arms. Signs are weakness or numbness in an arm. This happens suddenly and usually on one side of the body.  S - Speech. Signs are sudden trouble speaking, slurred speech, or trouble understanding what people say.  T - Time. Time to call emergency services. Write down what time symptoms  started. You have other signs of a stroke, such as:  A sudden, severe headache with no known cause.  Nausea or vomiting.  Seizure. These symptoms may represent a serious problem that is an emergency. Do not wait to see if the symptoms will go away. Get medical help right away. Call your local emergency services (911 in the [place] Do not drive yourself to the hospital. Summary  Aspirin use can help reduce the risk of blood clots, heart attacks, and other heart-related problems.  Daily use of aspirin can cause side effects.  Take aspirin only as told by your health care provider. Make sure that you understand how much to take and what form to take.  Your health care provider will help you determine whether it is safe and beneficial for you to take aspirin daily. This information is not intended to replace advice given to you by your health care provider. Make sure you discuss any questions you have with your health care provider. Document Revised: 11/28/2018 Document Reviewed: 11/28/2018 Elsevier Patient Education  2021 [place]. Nitroglycerin sublingual tablets What is this medicine? NITROGLYCERIN (nye troe GLI ser in) is a type of vasodilator. It relaxes blood vessels, increasing  the blood and oxygen supply to your heart. This medicine is used to relieve chest pain caused by angina. It is also used to prevent chest pain before activities like climbing stairs, going outdoors in cold weather, or sexual activity. This medicine may be used for other purposes; ask your health care provider or pharmacist if you have questions. COMMON BRAND NAME(S): Nitroquick, Nitrostat, Nitrotab What should I tell my health care provider before I take this medicine? They need to know if you have any of these conditions:  anemia  head injury, recent stroke, or bleeding in the brain  liver disease  previous heart attack  an unusual or allergic reaction to nitroglycerin, other medicines, foods,  dyes, or preservatives  pregnant or trying to get pregnant  breast-feeding How should I use this medicine? Take this medicine by mouth as needed. Use at the first sign of an angina attack (chest pain or tightness). You can also take this medicine 5 to 10 minutes before an event likely to produce chest pain. Follow the directions exactly as written on the prescription label. Place one tablet under your tongue and let it dissolve. Do not swallow whole. Replace the dose if you accidentally swallow it. It will help if your mouth is not dry. Saliva around the tablet will help it to dissolve more quickly. Do not eat or drink, smoke or chew tobacco while a tablet is dissolving. Sit down when taking this medicine. In an angina attack, you should feel better within 5 minutes after your first dose. You can take a dose every 5 minutes up to a total of 3 doses. If you do not feel better or feel worse after 1 dose, call 9-1-1 at once. Do not take more than 3 doses in 15 minutes. Your health care provider might give you other directions. Follow those directions if he or she does. Do not take your medicine more often than directed. Talk to your health care provider about the use of this medicine in children. Special care may be needed. Overdosage: If you think you have taken too much of this medicine contact a poison control center or emergency room at once. NOTE: This medicine is only for you. Do not share this medicine with others. What if I miss a dose? This does not apply. This medicine is only used as needed. What may interact with this medicine? Do not take this medicine with any of the following medications:  certain migraine medicines like ergotamine and dihydroergotamine (DHE)  medicines used to treat erectile dysfunction like sildenafil, tadalafil, and vardenafil  riociguat This medicine may also interact with the following medications:  alteplase  aspirin  heparin  medicines for high blood  pressure  medicines for mental depression  other medicines used to treat angina  phenothiazines like chlorpromazine, mesoridazine, prochlorperazine, thioridazine This list may not describe all possible interactions. Give your health care provider a list of all the medicines, herbs, non-prescription drugs, or dietary supplements you use. Also tell them if you smoke, drink alcohol, or use illegal drugs. Some items may interact with your medicine. What should I watch for while using this medicine? Tell your doctor or health care professional if you feel your medicine is no longer working. Keep this medicine with you at all times. Sit or lie down when you take your medicine to prevent falling if you feel dizzy or faint after using it. Try to remain calm. This will help you to feel better faster. If you feel dizzy, take  several deep breaths and lie down with your feet propped up, or bend forward with your head resting between your knees. You may get drowsy or dizzy. Do not drive, use machinery, or do anything that needs mental alertness until you know how this drug affects you. Do not stand or sit up quickly, especially if you are an older patient. This reduces the risk of dizzy or fainting spells. Alcohol can make you more drowsy and dizzy. Avoid alcoholic drinks. Do not treat yourself for coughs, colds, or pain while you are taking this medicine without asking your doctor or health care professional for advice. Some ingredients may increase your blood pressure. What side effects may I notice from receiving this medicine? Side effects that you should report to your doctor or health care professional as soon as possible:  allergic reactions (skin rash, itching or hives; swelling of the face, lips, or tongue)  low blood pressure (dizziness; feeling faint or lightheaded, falls; unusually weak or tired)  low red blood cell counts (trouble breathing; feeling faint; lightheaded, falls; unusually weak or  tired) Side effects that usually do not require medical attention (report to your doctor or health care professional if they continue or are bothersome):  facial flushing (redness)  headache  nausea, vomiting This list may not describe all possible side effects. Call your doctor for medical advice about side effects. You may report side effects to FDA at 1-800-FDA-1088. Where should I keep my medicine? Keep out of the reach of children. Store at room temperature between 20 and 25 degrees C (68 and 77 degrees F). Store in [facility]. Protect from light and moisture. Keep tightly closed. Throw away any unused medicine after the expiration date. NOTE: This sheet is a summary. It may not cover all possible information. If you have questions about this medicine, talk to your doctor, pharmacist, or health care provider.  2021 Elsevier/Gold Standard (2017-11-24 16:46:32)

## 2020-05-17 NOTE — Progress Notes (Signed)
Cardiology Office Note:    Date:  05/17/2020   ID:  Heather Chavez, DOB 1945/03/28, MRN [id number]  PCP:  Ronita Hipps, MD  Cardiologist:  Jenean Lindau, MD   Referring MD: Serita Grammes, MD    ASSESSMENT:    1. Angina pectoris ([place])   2. Coronary artery calcification   3. Essential hypertension   4. Mixed dyslipidemia   5. Diabetes mellitus due to underlying condition with unspecified complications ([place])   6. Cardiac murmur   7. Obesity (BMI 35.0-39.9 without comorbidity)    PLAN:    In order of problems listed above:  1. Dyspnea on exertion and angina pectoris: I discussed my findings with the patient at extensive length.  She has multiple risk factors for coronary artery disease and does have coronary calcification.  I discussed with his modalities of evaluation invasive and noninvasive and she prefers CT coronary angiography with FFR.  I respect her wishes.  In the interim of asked her to take nitroglycerin on a as needed basis.  Sublingual nitroglycerin prescription was sent, its protocol and 911 protocol explained and the patient vocalized understanding questions were answered to the patient's satisfaction.  She was advised to take an aspirin on a daily basis 81 mg coated she knows to go to the nearest emergency room for any concerning symptoms. 2. Essential hypertension: Blood pressure stable and diet was emphasized. 3. Sinus tachycardia: I have started her on metoprolol 25 mg succinate on a daily basis.  I will try to get a copy of all blood work from her primary care provider including TSH. 4. Obesity: Weight reduction was stressed risks of obesity explained and she vocalized understanding. 5. Mixed lipidemia: On statins.  She just initiated this.  Diet was emphasized weight reduction stressed 6. Cardiac murmur: Echocardiogram will be done to assess murmur heard on auscultation. 7. She will be seen follow-up appointment 2 months or earlier if she has any  concerns.  She had multiple questions which were answered to her satisfaction.   Medication Adjustments/Labs and Tests Ordered: Current medicines are reviewed at length with the patient today.  Concerns regarding medicines are outlined above.  No orders of the defined types were placed in this encounter.  No orders of the defined types were placed in this encounter.    History of Present Illness:    Heather Chavez is a 75 y.o. female who is being seen today for the evaluation of chest discomfort at the request of Serita Grammes, MD.  Patient is a pleasant 75 year old female.  She has past medical history of calcification in the aorta at the coronary arteries, essential hypertension, dyslipidemia and diabetes mellitus.  She is an obese lady.  She leads a sedentary lifestyle.  She is an ex-smoker.  She complains of elevated heart rate and also on exertion she has chest tightness and dyspnea.  No orthopnea or PND.  At the time of my evaluation, the patient is alert awake oriented and in no distress.  Her CT scan has revealed significant calcification in the aorta and coronary arteries.  Past Medical History:  Diagnosis Date  . Advanced nonexudative age-related macular degeneration of right eye with subfoveal involvement 09/21/2019  . Allergic rhinitis   . Bilateral breast cancer ([place]) 11/23/2013  . BMI 34.0-34.9,adult 09/25/2019  . Breast cancer ([place])   . Bronchitis, chronic ([place])   . CAD (coronary artery disease)   . Cancer ([place])   . Degenerative retinal drusen of right eye  09/21/2019  . Diverticulosis   . Dry eyes 06/25/2016  . Dyspnea   . Emphysema/COPD ([place])   . Estrogen receptor positive neoplasm 09/24/2015  . Family history of malignant neoplasm of breast 11/23/2013  . Family history of peritoneal cancer 11/23/2013  . Fuchs' corneal dystrophy 11/03/2016  . GERD (gastroesophageal reflux disease)   . History of right breast cancer 09/19/2018  . Hypertension   . Intermediate  stage nonexudative age-related macular degeneration of left eye 09/21/2019  . Iritis 11/03/2016  . Macular degeneration   . Major depressive disorder   . Malignant neoplasm of upper-inner quadrant of left breast in female, estrogen receptor positive ([place]) 09/01/2016  . Obesity   . Personal history of chemotherapy   . Personal history of radiation therapy   . Pseudophakia 09/21/2019  . Right-sided chest wall pain   . Varicose veins of left lower extremity with complications 09/13/2421    Past Surgical History:  Procedure Laterality Date  . BREAST BIOPSY    . BREAST LUMPECTOMY     right brwast 2005/ left 2015  . CATARACT EXTRACTION    . CHOLECYSTECTOMY    . TUBAL LIGATION    . VAGINAL HYSTERECTOMY    . VEIN LIGATION AND STRIPPING      Current Medications: Current Meds  Medication Sig  . albuterol (VENTOLIN HFA) 108 (90 Base) MCG/ACT inhaler Inhale 2 puffs into the lungs every 4 (four) hours as needed for wheezing.  . Biotin 10000 MCG TABS Take 10,000 mcg by mouth daily.  . DULoxetine (CYMBALTA) 60 MG capsule Take 60 mg by mouth daily.  . fluticasone (FLONASE) 50 MCG/ACT nasal spray Place 1 spray into both nostrils daily.  . Fluticasone-Umeclidin-Vilant (TRELEGY ELLIPTA) 100-62.5-25 MCG/INH AEPB Inhale 1 puff into the lungs daily.  Marland Kitchen gabapentin (NEURONTIN) 400 MG capsule Take 400 mg by mouth at bedtime.  Marland Kitchen losartan-hydrochlorothiazide (HYZAAR) 100-25 MG tablet Take 1 tablet by mouth daily.  . meloxicam (MOBIC) 15 MG tablet Take 15 mg by mouth as needed for pain.  . metFORMIN (GLUCOPHAGE) 500 MG tablet Take 500 mg by mouth daily.  . Misc Natural Products (LUTEIN 20 PO) Take 20 mg by mouth daily.  . Multiple Vitamins-Minerals (OCUVITE-LUTEIN PO) Take 1 tablet by mouth daily.  Marland Kitchen omega-3 fish oil (MAXEPA) 1000 MG CAPS capsule Take 1,000 mg by mouth daily.  Marland Kitchen omeprazole (PRILOSEC) 20 MG capsule Take 20 mg by mouth 2 (two) times daily.  . potassium chloride (KLOR-CON) 10 MEQ tablet Take 10  mEq by mouth daily.  . vitamin E 180 MG (400 UNITS) capsule Take 400 Units by mouth daily.  . [DISCONTINUED] levofloxacin (LEVAQUIN) 500 MG tablet Take 500 mg by mouth daily.     Allergies:   Codeine and Acetaminophen   Social History   Socioeconomic History  . Marital status: Married    Spouse name: Not on file  . Number of children: Not on file  . Years of education: Not on file  . Highest education level: Not on file  Occupational History  . Not on file  Tobacco Use  . Smoking status: Former Smoker    Packs/day: 2.50    Years: 15.00    Pack years: 37.50    Types: Cigarettes    Quit date: 03/09/1981    Years since quitting: 39.2  . Smokeless tobacco: Never Used  Vaping Use  . Vaping Use: Never used  Substance and Sexual Activity  . Alcohol use: Yes  . Drug use: No  .  Sexual activity: Not on file  Other Topics Concern  . Not on file  Social History Narrative  . Not on file   Social Determinants of Health   Financial Resource Strain: Not on file  Food Insecurity: Not on file  Transportation Needs: Not on file  Physical Activity: Not on file  Stress: Not on file  Social Connections: Not on file     Family History: The patient's family history includes Breast cancer in her mother and sister; Cancer in her paternal grandmother; Cancer (age of onset: 67) in her sister; Cancer (age of onset: 18) in her mother and paternal aunt; Cancer (age of onset: 25) in her father; Other in her mother.  ROS:   Please see the history of present illness.    All other systems reviewed and are negative.  EKGs/Labs/Other Studies Reviewed:    The following studies were reviewed today: I discussed my findings with the patient at length.  EKG with sinus tachycardia and nonspecific ST changes   Recent Labs: No results found for requested labs within last 8760 hours.  Recent Lipid Panel No results found for: CHOL, TRIG, HDL, CHOLHDL, VLDL, LDLCALC, LDLDIRECT  Physical Exam:    VS:   BP 132/62   Pulse (!) 113   Ht 5\' 5"  (1.651 m)   Wt 216 lb (98 kg)   SpO2 96%   BMI 35.94 kg/m     Wt Readings from Last 3 Encounters:  05/17/20 216 lb (98 kg)  06/01/18 215 lb (97.5 kg)  11/17/16 219 lb (99.3 kg)     GEN: Patient is in no acute distress HEENT: Normal NECK: No JVD; No carotid bruits LYMPHATICS: No lymphadenopathy CARDIAC: S1 S2 regular, 2/6 systolic murmur at the apex. RESPIRATORY:  Clear to auscultation without rales, wheezing or rhonchi  ABDOMEN: Soft, non-tender, non-distended MUSCULOSKELETAL:  No edema; No deformity  SKIN: Warm and dry NEUROLOGIC:  Alert and oriented x 3 PSYCHIATRIC:  Normal affect    Signed, Jenean Lindau, MD  05/17/2020 4:03 PM    [facility]

## 2020-05-20 DIAGNOSIS — R7989 Other specified abnormal findings of blood chemistry: Secondary | ICD-10-CM | POA: Diagnosis not present

## 2020-05-20 DIAGNOSIS — E669 Obesity, unspecified: Secondary | ICD-10-CM | POA: Diagnosis present

## 2020-05-20 DIAGNOSIS — K219 Gastro-esophageal reflux disease without esophagitis: Secondary | ICD-10-CM | POA: Diagnosis present

## 2020-05-20 DIAGNOSIS — Z87891 Personal history of nicotine dependence: Secondary | ICD-10-CM | POA: Diagnosis not present

## 2020-05-20 DIAGNOSIS — Z6835 Body mass index (BMI) 35.0-35.9, adult: Secondary | ICD-10-CM | POA: Diagnosis not present

## 2020-05-20 DIAGNOSIS — F32A Depression, unspecified: Secondary | ICD-10-CM | POA: Diagnosis present

## 2020-05-20 DIAGNOSIS — J441 Chronic obstructive pulmonary disease with (acute) exacerbation: Secondary | ICD-10-CM | POA: Diagnosis not present

## 2020-05-20 DIAGNOSIS — E785 Hyperlipidemia, unspecified: Secondary | ICD-10-CM | POA: Diagnosis present

## 2020-05-20 DIAGNOSIS — R0600 Dyspnea, unspecified: Secondary | ICD-10-CM | POA: Diagnosis present

## 2020-05-20 DIAGNOSIS — F419 Anxiety disorder, unspecified: Secondary | ICD-10-CM | POA: Diagnosis present

## 2020-05-20 DIAGNOSIS — R000 Tachycardia, unspecified: Secondary | ICD-10-CM | POA: Diagnosis not present

## 2020-05-20 DIAGNOSIS — D649 Anemia, unspecified: Secondary | ICD-10-CM | POA: Diagnosis present

## 2020-05-20 DIAGNOSIS — I10 Essential (primary) hypertension: Secondary | ICD-10-CM | POA: Diagnosis not present

## 2020-05-20 DIAGNOSIS — J8410 Pulmonary fibrosis, unspecified: Secondary | ICD-10-CM | POA: Diagnosis present

## 2020-05-20 DIAGNOSIS — Z7952 Long term (current) use of systemic steroids: Secondary | ICD-10-CM | POA: Diagnosis not present

## 2020-05-20 DIAGNOSIS — Z7984 Long term (current) use of oral hypoglycemic drugs: Secondary | ICD-10-CM | POA: Diagnosis not present

## 2020-05-20 DIAGNOSIS — Z888 Allergy status to other drugs, medicaments and biological substances status: Secondary | ICD-10-CM | POA: Diagnosis not present

## 2020-05-20 DIAGNOSIS — J449 Chronic obstructive pulmonary disease, unspecified: Secondary | ICD-10-CM | POA: Diagnosis not present

## 2020-05-20 DIAGNOSIS — R0609 Other forms of dyspnea: Secondary | ICD-10-CM | POA: Diagnosis not present

## 2020-05-20 DIAGNOSIS — Z885 Allergy status to narcotic agent status: Secondary | ICD-10-CM | POA: Diagnosis not present

## 2020-05-20 DIAGNOSIS — Z853 Personal history of malignant neoplasm of breast: Secondary | ICD-10-CM | POA: Diagnosis not present

## 2020-05-20 DIAGNOSIS — Z79899 Other long term (current) drug therapy: Secondary | ICD-10-CM | POA: Diagnosis not present

## 2020-05-20 DIAGNOSIS — G629 Polyneuropathy, unspecified: Secondary | ICD-10-CM | POA: Diagnosis present

## 2020-05-20 DIAGNOSIS — E119 Type 2 diabetes mellitus without complications: Secondary | ICD-10-CM | POA: Diagnosis not present

## 2020-05-20 DIAGNOSIS — J9811 Atelectasis: Secondary | ICD-10-CM | POA: Diagnosis not present

## 2020-05-20 DIAGNOSIS — R0602 Shortness of breath: Secondary | ICD-10-CM | POA: Diagnosis not present

## 2020-05-20 DIAGNOSIS — R002 Palpitations: Secondary | ICD-10-CM | POA: Diagnosis not present

## 2020-05-20 DIAGNOSIS — I471 Supraventricular tachycardia: Secondary | ICD-10-CM | POA: Diagnosis not present

## 2020-05-20 DIAGNOSIS — M1990 Unspecified osteoarthritis, unspecified site: Secondary | ICD-10-CM | POA: Diagnosis present

## 2020-05-20 DIAGNOSIS — R079 Chest pain, unspecified: Secondary | ICD-10-CM | POA: Diagnosis not present

## 2020-05-21 ENCOUNTER — Telehealth: Payer: Self-pay

## 2020-05-21 DIAGNOSIS — R079 Chest pain, unspecified: Secondary | ICD-10-CM

## 2020-05-21 DIAGNOSIS — R000 Tachycardia, unspecified: Secondary | ICD-10-CM

## 2020-05-21 NOTE — Telephone Encounter (Signed)
Pt is currently in the hospital and aware we need a fasting lipid panel.

## 2020-05-22 ENCOUNTER — Telehealth: Payer: Self-pay | Admitting: Cardiology

## 2020-05-22 DIAGNOSIS — J449 Chronic obstructive pulmonary disease, unspecified: Secondary | ICD-10-CM | POA: Diagnosis not present

## 2020-05-22 DIAGNOSIS — R002 Palpitations: Secondary | ICD-10-CM | POA: Diagnosis not present

## 2020-05-22 DIAGNOSIS — I471 Supraventricular tachycardia: Secondary | ICD-10-CM | POA: Diagnosis not present

## 2020-05-22 DIAGNOSIS — J9811 Atelectasis: Secondary | ICD-10-CM | POA: Diagnosis not present

## 2020-05-22 DIAGNOSIS — I10 Essential (primary) hypertension: Secondary | ICD-10-CM | POA: Diagnosis not present

## 2020-05-22 DIAGNOSIS — R000 Tachycardia, unspecified: Secondary | ICD-10-CM | POA: Diagnosis not present

## 2020-05-22 DIAGNOSIS — R079 Chest pain, unspecified: Secondary | ICD-10-CM | POA: Diagnosis not present

## 2020-05-22 NOTE — Telephone Encounter (Signed)
Patient states she had her Lipid panel drawn at her PCP. The records should be in the [facility] system.  Please let her know if Dr. Geraldo Pitter wants her to do them again

## 2020-05-23 ENCOUNTER — Other Ambulatory Visit: Payer: Self-pay

## 2020-05-23 ENCOUNTER — Ambulatory Visit (INDEPENDENT_AMBULATORY_CARE_PROVIDER_SITE_OTHER): Payer: Medicare Other | Admitting: Cardiology

## 2020-05-23 VITALS — BP 138/78 | HR 94 | Ht 65.0 in | Wt 215.0 lb

## 2020-05-23 DIAGNOSIS — Z86010 Personal history of colonic polyps: Secondary | ICD-10-CM | POA: Insufficient documentation

## 2020-05-23 DIAGNOSIS — E088 Diabetes mellitus due to underlying condition with unspecified complications: Secondary | ICD-10-CM | POA: Diagnosis not present

## 2020-05-23 DIAGNOSIS — E669 Obesity, unspecified: Secondary | ICD-10-CM

## 2020-05-23 DIAGNOSIS — I471 Supraventricular tachycardia, unspecified: Secondary | ICD-10-CM

## 2020-05-23 DIAGNOSIS — E782 Mixed hyperlipidemia: Secondary | ICD-10-CM

## 2020-05-23 DIAGNOSIS — K589 Irritable bowel syndrome without diarrhea: Secondary | ICD-10-CM | POA: Insufficient documentation

## 2020-05-23 DIAGNOSIS — I10 Essential (primary) hypertension: Secondary | ICD-10-CM | POA: Diagnosis not present

## 2020-05-23 HISTORY — DX: Supraventricular tachycardia: I47.1

## 2020-05-23 HISTORY — DX: Supraventricular tachycardia, unspecified: I47.10

## 2020-05-23 MED ORDER — FLECAINIDE ACETATE 50 MG PO TABS: 50.0000 mg | ORAL_TABLET | Freq: Two times a day (BID) | ORAL | 2 refills | Status: DC

## 2020-05-23 NOTE — Patient Instructions (Addendum)
Medication Instructions:  Start Flecainide 50 2 times a day Flecainide tablets What is this medicine? FLECAINIDE (FLEK a nide) is an antiarrhythmic drug. This medicine is used to prevent irregular heart rhythm. It can also slow down fast heartbeats called tachycardia. This medicine may be used for other purposes; ask your health care provider or pharmacist if you have questions. COMMON BRAND NAME(S): Tambocor What should I tell my health care provider before I take this medicine? They need to know if you have any of these conditions:  abnormal levels of potassium in the blood  heart disease including heart rhythm and heart rate problems  kidney or liver disease  recent heart attack  an unusual or allergic reaction to flecainide, local anesthetics, other medicines, foods, dyes, or preservatives  pregnant or trying to get pregnant  breast-feeding How should I use this medicine? Take this medicine by mouth with a glass of water. Follow the directions on the prescription label. You can take this medicine with or without food. Take your doses at regular intervals. Do not take your medicine more often than directed. Do not stop taking this medicine suddenly. This may cause serious, heart-related side effects. If your doctor wants you to stop the medicine, the dose may be slowly lowered over time to avoid any side effects. Talk to your pediatrician regarding the use of this medicine in children. While this drug may be prescribed for children as young as 1 year of age for selected conditions, precautions do apply. Overdosage: If you think you have taken too much of this medicine contact a poison control center or emergency room at once. NOTE: This medicine is only for you. Do not share this medicine with others. What if I miss a dose? If you miss a dose, take it as soon as you can. If it is almost time for your next dose, take only that dose. Do not take double or extra doses. What may interact  with this medicine? Do not take this medicine with any of the following medications:  amoxapine  arsenic trioxide  certain antibiotics like clarithromycin, erythromycin, gatifloxacin, gemifloxacin, levofloxacin, moxifloxacin, sparfloxacin, or troleandomycin  certain antidepressants called tricyclic antidepressants like amitriptyline, imipramine, or nortriptyline  certain medicines to control heart rhythm like disopyramide, encainide, moricizine, procainamide, propafenone, and quinidine  cisapride  delavirdine  droperidol  haloperidol  [place]  imatinib  levomethadyl  maprotiline  medicines for malaria like chloroquine and halofantrine  pentamidine  phenothiazines like chlorpromazine, mesoridazine, prochlorperazine, thioridazine  pimozide  quinine  ranolazine  ritonavir  sertindole This medicine may also interact with the following medications:  cimetidine  dofetilide  medicines for angina or high blood pressure  medicines to control heart rhythm like amiodarone and digoxin  ziprasidone This list may not describe all possible interactions. Give your health care provider a list of all the medicines, herbs, non-prescription drugs, or dietary supplements you use. Also tell them if you smoke, drink alcohol, or use illegal drugs. Some items may interact with your medicine. What should I watch for while using this medicine? Visit your doctor or health care professional for regular checks on your progress. Because your condition and the use of this medicine carries some risk, it is a good idea to carry an identification card, necklace or bracelet with details of your condition, medications and doctor or health care professional. Check your blood pressure and pulse rate regularly. Ask your health care professional what your blood pressure and pulse rate should be, and when you should  contact him or her. Your doctor or health care professional also may schedule  regular blood tests and electrocardiograms to check your progress. You may get drowsy or dizzy. Do not drive, use machinery, or do anything that needs mental alertness until you know how this medicine affects you. Do not stand or sit up quickly, especially if you are an older patient. This reduces the risk of dizzy or fainting spells. Alcohol can make you more dizzy, increase flushing and rapid heartbeats. Avoid alcoholic drinks. What side effects may I notice from receiving this medicine? Side effects that you should report to your doctor or health care professional as soon as possible:  chest pain, continued irregular heartbeats  difficulty breathing  swelling of the legs or feet  trembling, shaking  unusually weak or tired Side effects that usually do not require medical attention (report to your doctor or health care professional if they continue or are bothersome):  blurred vision  constipation  headache  nausea, vomiting  stomach pain This list may not describe all possible side effects. Call your doctor for medical advice about side effects. You may report side effects to FDA at 1-800-FDA-1088. Where should I keep my medicine? Keep out of the reach of children. Store at room temperature between 15 and 30 degrees C (59 and 86 degrees F). Protect from light. Keep container tightly closed. Throw away any unused medicine after the expiration date. NOTE: This sheet is a summary. It may not cover all possible information. If you have questions about this medicine, talk to your doctor, pharmacist, or health care provider.  2021 Elsevier/Gold Standard (2018-02-14 11:41:38) *If you need a refill on your cardiac medications before your next appointment, please call your pharmacy*   Lab Work: None If you have labs (blood work) drawn today and your tests are completely normal, you will receive your results only by: Marland Kitchen MyChart Message (if you have MyChart) OR . A paper copy in the  mail If you have any lab test that is abnormal or we need to change your treatment, we will call you to review the results.   Testing/Procedures: EKG on Monday in [place]: At [facility], you and your health needs are our priority.  As part of our continuing mission to provide you with exceptional heart care, we have created designated Provider Care Teams.  These Care Teams include your primary Cardiologist (physician) and Advanced Practice Providers (APPs -  Physician Assistants and Nurse Practitioners) who all work together to provide you with the care you need, when you need it.  We recommend signing up for the patient portal called "MyChart".  Sign up information is provided on this After Visit Summary.  MyChart is used to connect with patients for Virtual Visits (Telemedicine).  Patients are able to view lab/test results, encounter notes, upcoming appointments, etc.  Non-urgent messages can be sent to your provider as well.   To learn more about what you can do with MyChart, go to [email or web address].    Your next appointment:   1 month(s)  The format for your next appointment:   In Person  Provider:   Jyl Heinz, MD   Other Instructions

## 2020-05-23 NOTE — Progress Notes (Signed)
Cardiology Office Note:    Date:  05/23/2020   ID:  Heather Chavez, DOB 06/05/1945, MRN [id number]  PCP:  Ronita Hipps, MD  Cardiologist:  Jenean Lindau, MD   Referring MD: Ronita Hipps, MD    ASSESSMENT:    1. Essential hypertension   2. Mixed dyslipidemia   3. Diabetes mellitus due to underlying condition with unspecified complications ([place])   4. Obesity (BMI 35.0-39.9 without comorbidity)   5. SVT (supraventricular tachycardia) (HCC)    PLAN:    In order of problems listed above:  1. Primary prevention stressed with the patient.  Importance of compliance with diet medication stressed any vocalized understanding. 2. [facility] records were reviewed extensively by me and questions were answered to her satisfaction.  Total time for this evaluation and management today was 25 minutes. 3. Paroxysmal SVT: In view of this I initiated on flecainide 50 mg every 12 hours.  Patient will come on Monday for repeat EKG. 4. Essential hypertension: Blood pressure stable and diet was emphasized. 5. Mixed dyslipidemia diabetes mellitus: Diet emphasized.  Weight reduction was stressed.  She is overweight and she understands.  She is doing her best to get better. 6. Patient will be seen in follow-up appointment in 4 weeks or earlier if the patient has any concerns    Medication Adjustments/Labs and Tests Ordered: Current medicines are reviewed at length with the patient today.  Concerns regarding medicines are outlined above.  No orders of the defined types were placed in this encounter.  No orders of the defined types were placed in this encounter.    No chief complaint on file.    History of Present Illness:    Heather Chavez is a 75 y.o. female.  Patient has past medical history of essential hypertension, mixed dyslipidemia and diabetes mellitus.  She went to the hospital a couple of times in the past week.  This is with shortness of breath.  She was  admitted and treated.  Stress test and echocardiogram was unremarkable.  She was found to have sinus tachycardia and initiated on beta-blockade.  Recently it has become evident that she goes into paroxysms of SVT then she gets into a very scared situation.  She panics about it and it gets worse.  No chest pain orthopnea or PND.  She underwent stress testing and this was unremarkable.  She went in again last night with paroxysmal supraventricular tachycardia.  At the time of my evaluation, the patient is alert awake oriented and in no distress.  Past Medical History:  Diagnosis Date  . Advanced nonexudative age-related macular degeneration of right eye with subfoveal involvement 09/21/2019  . Allergic rhinitis   . Angina pectoris ([place]) 05/17/2020  . Bilateral breast cancer ([place]) 11/23/2013  . BMI 34.0-34.9,adult 09/25/2019  . Breast cancer ([place])   . Bronchitis, chronic ([place])   . CAD (coronary artery disease)   . Cancer ([place])   . Cardiac murmur 05/17/2020  . Coronary artery calcification 05/17/2020  . Degenerative retinal drusen of right eye 09/21/2019  . Diabetes mellitus due to underlying condition with unspecified complications ([place]) 1/95/0932  . Diverticulosis   . Dry eyes 06/25/2016  . Dyspnea   . Emphysema/COPD ([place])   . Essential hypertension 05/17/2020  . Estrogen receptor positive neoplasm 09/24/2015  . Family history of malignant neoplasm of breast 11/23/2013  . Family history of peritoneal cancer 11/23/2013  . Fuchs' corneal dystrophy 11/03/2016  . GERD (gastroesophageal reflux disease)   .  History of colon polyps   . History of right breast cancer 09/19/2018  . Hypertension   . IBS (irritable bowel syndrome)    with D  . Intermediate stage nonexudative age-related macular degeneration of left eye 09/21/2019  . Iritis 11/03/2016  . Macular degeneration   . Major depressive disorder   . Malignant neoplasm of upper-inner quadrant of left breast in female, estrogen receptor positive ([place])  09/01/2016  . Mixed dyslipidemia 05/17/2020  . Obesity   . Obesity (BMI 35.0-39.9 without comorbidity) 05/17/2020  . Personal history of chemotherapy   . Personal history of radiation therapy   . Pseudophakia 09/21/2019  . Right-sided chest wall pain   . Varicose veins of left lower extremity with complications 09/08/7104    Past Surgical History:  Procedure Laterality Date  . BREAST BIOPSY    . BREAST LUMPECTOMY     right brwast 2005/ left 2015  . CATARACT EXTRACTION    . CHOLECYSTECTOMY    . COLONOSCOPY  04/17/2015   Colonic polyps status post polypectomy. Moderate to severe sigmoid diverticulosis  . TUBAL LIGATION    . VAGINAL HYSTERECTOMY    . VEIN LIGATION AND STRIPPING      Current Medications: Current Meds  Medication Sig  . albuterol (VENTOLIN HFA) 108 (90 Base) MCG/ACT inhaler Inhale 2 puffs into the lungs every 4 (four) hours as needed for wheezing.  Marland Kitchen aspirin EC 81 MG tablet Take 1 tablet (81 mg total) by mouth daily. Swallow whole.  . Biotin 10000 MCG TABS Take 10,000 mcg by mouth daily.  . DULoxetine (CYMBALTA) 60 MG capsule Take 60 mg by mouth daily.  . ferrous sulfate 325 (65 FE) MG tablet Take 325 mg by mouth daily with breakfast.  . Fluticasone-Umeclidin-Vilant (TRELEGY ELLIPTA) 100-62.5-25 MCG/INH AEPB Inhale 1 puff into the lungs daily.  Marland Kitchen gabapentin (NEURONTIN) 400 MG capsule Take 400 mg by mouth at bedtime.  Marland Kitchen ipratropium-albuterol (DUONEB) 0.5-2.5 (3) MG/3ML SOLN Take 3 mLs by nebulization.  Marland Kitchen losartan-hydrochlorothiazide (HYZAAR) 100-25 MG tablet Take 1 tablet by mouth daily.  . meloxicam (MOBIC) 15 MG tablet Take 15 mg by mouth as needed for pain.  . metFORMIN (GLUCOPHAGE) 500 MG tablet Take 500 mg by mouth daily.  . metoprolol succinate (TOPROL XL) 25 MG 24 hr tablet Take 1 tablet (25 mg total) by mouth daily.  . Misc Natural Products (LUTEIN 20 PO) Take 20 mg by mouth daily.  . montelukast (SINGULAIR) 10 MG tablet Take 10 mg by mouth at bedtime.  .  Multiple Vitamins-Minerals (OCUVITE-LUTEIN PO) Take 1 tablet by mouth daily.  . nitroGLYCERIN (NITROSTAT) 0.4 MG SL tablet Place 1 tablet (0.4 mg total) under the tongue every 5 (five) minutes as needed.  Marland Kitchen omega-3 fish oil (MAXEPA) 1000 MG CAPS capsule Take 1,000 mg by mouth daily.  Marland Kitchen omeprazole (PRILOSEC) 20 MG capsule Take 20 mg by mouth 2 (two) times daily.  . potassium chloride (KLOR-CON) 10 MEQ tablet Take 10 mEq by mouth daily.  . predniSONE (DELTASONE) 10 MG tablet Take 10 mg by mouth daily with breakfast.  . promethazine-dextromethorphan (PROMETHAZINE-DM) 6.25-15 MG/5ML syrup Take 5 mLs by mouth at bedtime as needed for nausea.  . rosuvastatin (CRESTOR) 5 MG tablet Take 5 mg by mouth daily.  . vitamin E 180 MG (400 UNITS) capsule Take 400 Units by mouth daily.  Marland Kitchen zinc gluconate 50 MG tablet Take 50 mg by mouth daily.     Allergies:   Codeine and Acetaminophen   Social History  Socioeconomic History  . Marital status: Married    Spouse name: Not on file  . Number of children: Not on file  . Years of education: Not on file  . Highest education level: Not on file  Occupational History  . Not on file  Tobacco Use  . Smoking status: Former Smoker    Packs/day: 2.50    Years: 15.00    Pack years: 37.50    Types: Cigarettes    Quit date: 03/09/1981    Years since quitting: 39.2  . Smokeless tobacco: Never Used  Vaping Use  . Vaping Use: Never used  Substance and Sexual Activity  . Alcohol use: Yes  . Drug use: No  . Sexual activity: Not on file  Other Topics Concern  . Not on file  Social History Narrative  . Not on file   Social Determinants of Health   Financial Resource Strain: Not on file  Food Insecurity: Not on file  Transportation Needs: Not on file  Physical Activity: Not on file  Stress: Not on file  Social Connections: Not on file     Family History: The patient's family history includes Breast cancer in her mother and sister; Cancer in her paternal  grandmother; Cancer (age of onset: 82) in her sister; Cancer (age of onset: 43) in her mother and paternal aunt; Cancer (age of onset: 69) in her father; Other in her mother.  ROS:   Please see the history of present illness.    All other systems reviewed and are negative.  EKGs/Labs/Other Studies Reviewed:    The following studies were reviewed today: EKG reveals sinus rhythm normal QRS and nonspecific ST-T changes   Recent Labs: No results found for requested labs within last 8760 hours.  Recent Lipid Panel No results found for: CHOL, TRIG, HDL, CHOLHDL, VLDL, LDLCALC, LDLDIRECT  Physical Exam:    VS:  BP 138/78   Pulse 94   Ht 5\' 5"  (1.651 m)   Wt 215 lb (97.5 kg)   SpO2 98%   BMI 35.78 kg/m     Wt Readings from Last 3 Encounters:  05/23/20 215 lb (97.5 kg)  05/17/20 216 lb (98 kg)  06/01/18 215 lb (97.5 kg)     GEN: Patient is in no acute distress HEENT: Normal NECK: No JVD; No carotid bruits LYMPHATICS: No lymphadenopathy CARDIAC: Hear sounds regular, 2/6 systolic murmur at the apex. RESPIRATORY:  Clear to auscultation without rales, wheezing or rhonchi  ABDOMEN: Soft, non-tender, non-distended MUSCULOSKELETAL:  No edema; No deformity  SKIN: Warm and dry NEUROLOGIC:  Alert and oriented x 3 PSYCHIATRIC:  Normal affect   Signed, Jenean Lindau, MD  05/23/2020 3:10 PM    [facility]

## 2020-05-24 ENCOUNTER — Other Ambulatory Visit: Payer: Self-pay | Admitting: Vascular Surgery

## 2020-05-24 DIAGNOSIS — Z1231 Encounter for screening mammogram for malignant neoplasm of breast: Secondary | ICD-10-CM

## 2020-05-27 ENCOUNTER — Other Ambulatory Visit: Payer: Self-pay

## 2020-05-27 ENCOUNTER — Ambulatory Visit (INDEPENDENT_AMBULATORY_CARE_PROVIDER_SITE_OTHER): Payer: Medicare Other | Admitting: Cardiology

## 2020-05-27 VITALS — BP 132/62 | HR 101 | Ht 65.0 in | Wt 210.8 lb

## 2020-05-27 DIAGNOSIS — I471 Supraventricular tachycardia: Secondary | ICD-10-CM | POA: Diagnosis not present

## 2020-05-29 DIAGNOSIS — Z09 Encounter for follow-up examination after completed treatment for conditions other than malignant neoplasm: Secondary | ICD-10-CM | POA: Diagnosis not present

## 2020-05-29 DIAGNOSIS — J439 Emphysema, unspecified: Secondary | ICD-10-CM | POA: Diagnosis not present

## 2020-05-29 DIAGNOSIS — Z6833 Body mass index (BMI) 33.0-33.9, adult: Secondary | ICD-10-CM | POA: Diagnosis not present

## 2020-05-29 DIAGNOSIS — Z6834 Body mass index (BMI) 34.0-34.9, adult: Secondary | ICD-10-CM | POA: Diagnosis not present

## 2020-05-30 ENCOUNTER — Ambulatory Visit (INDEPENDENT_AMBULATORY_CARE_PROVIDER_SITE_OTHER): Payer: Medicare Other | Admitting: Pulmonary Disease

## 2020-05-30 ENCOUNTER — Encounter: Payer: Self-pay | Admitting: Pulmonary Disease

## 2020-05-30 ENCOUNTER — Other Ambulatory Visit: Payer: Self-pay

## 2020-05-30 VITALS — BP 112/60 | HR 94 | Temp 97.3°F | Ht 65.0 in | Wt 211.2 lb

## 2020-05-30 DIAGNOSIS — R0602 Shortness of breath: Secondary | ICD-10-CM | POA: Diagnosis not present

## 2020-05-30 DIAGNOSIS — R0683 Snoring: Secondary | ICD-10-CM | POA: Diagnosis not present

## 2020-05-30 DIAGNOSIS — I209 Angina pectoris, unspecified: Secondary | ICD-10-CM | POA: Diagnosis not present

## 2020-05-30 NOTE — Patient Instructions (Signed)
Will have you sign a release for to get copy of your records from [facility]; will need to get a disk with company of images for your xrays and CT scans  Will arrange for pulmonary function test and home sleep study  Use nasal irrigation daily and follow this with flonase one spray in each nostril daily  Follow up in 6 weeks

## 2020-05-30 NOTE — Progress Notes (Signed)
[facility]  Chief Complaint  Patient presents with  . Follow-up    Patient is still coughing, states she got rid of it but it came back when she got pneumonia last summer. Shortness of breath with exertion, feels ok at rest. Does have pulse ox to check sats.     Constitutional:  BP 112/60 (BP Location: Right Arm, Patient Position: Sitting, Cuff Size: Normal)   Pulse 94   Temp (!) 97.3 F (36.3 C) (Temporal)   Ht 5\' 5"  (1.651 m)   Wt 211 lb 3.2 oz (95.8 kg)   SpO2 93%   BMI 35.15 kg/m   Past Medical History:  Breast cancer, Depression, GERD, HTN  Past Surgical History:  She  has a past surgical history that includes Breast lumpectomy; Breast biopsy; Cataract extraction; Vein ligation and stripping; Tubal ligation; Vaginal hysterectomy; Cholecystectomy; and Colonoscopy (04/17/2015).  Brief Summary:  Heather Chavez is a 75 y.o. female with cough and dyspnea.      Subjective:   She is here with her granddaughter.  She was in hospital at [facility] recently.  Unfortunately records aren't available at this time.  She was told she could have COPD, CHF, and/or pulmonary fibrosis.  She was also told she had lung scarring and elevated diaphragm.  She was told she has chronic lyme disease and would need to be on doxycycline for 6 months.  She has trouble with her sleep, and snores some.  She has been getting sinus congestion with post nasal drip.  She feels raw in her throat, especially from inhaler use.  She didn't feel like trelegy or albuterol helped much.  She has appointment with Dr. Lyndel Safe with GI to assess her swallowing and liver enzymes.  Physical Exam:   Appearance - well kempt   ENMT - no sinus tenderness, no oral exudate, no LAN, Mallampati 3 airway, no stridor  Respiratory - equal breath sounds bilaterally, no wheezing or rales  CV - s1s2 regular rate and rhythm, no murmurs  Ext - no clubbing, no edema  Skin - no  rashes  Psych - normal mood and affect   Pulmonary testing:    Chest Imaging:   CT chest 06/01/18 >> coronary calcification, emphysema, scar with volume loss RML and RLL  Sleep Tests:    Cardiac Tests:    Social History:  She  reports that she quit smoking about 39 years ago. Her smoking use included cigarettes. She has a 37.50 pack-year smoking history. She has never used smokeless tobacco. She reports current alcohol use. She reports that she does not use drugs.  Family History:  Her family history includes Breast cancer in her mother and sister; Cancer in her paternal grandmother; Cancer (age of onset: 43) in her sister; Cancer (age of onset: 23) in her mother and paternal aunt; Cancer (age of onset: 18) in her father; Other in her mother.     Assessment/Plan:   Chronic cough. - prior concern has been for asthma - will need to get copy of her records from recent hospital stay in [facility] - will arrange for pulmonary function test - she is to complete course of prednisone from her recent hospital stay - defer resume trelegy for now; this wasn't effective and causes throat irritation - prn albuterol  Allergic rhinitis with post nasal drip. - will have her use nasal irrigation and flonase - continue singulair  Hx of breast cancer s/p XRT. - will review her  imaging studies to determine if she has radiation fibrotic changes  Snoring. - will arrange for home sleep study to assess whether she could have sleep disordered breathing  Supraventricular tachycardia. - followed by Dr. Geraldo Pitter with Cardiology  Time Spent Involved in Patient Care on Day of Examination:  37 minutes  Follow up:  Patient Instructions  Will have you sign a release for to get copy of your records from [facility]; will need to get a disk with company of images for your xrays and CT scans  Will arrange for pulmonary function test and home sleep study  Use nasal irrigation daily  and follow this with flonase one spray in each nostril daily  Follow up in 6 weeks   Medication List:   Allergies as of 05/30/2020      Reactions   Codeine Other (See Comments), Nausea And Vomiting   Other reaction(s): Other (see comments), Vomiting   Acetaminophen Other (See Comments)   Unknown Unknown Other reaction(s): Eye Redness, Other (see comments) Unknown Unknown Unknown Unknown Unknown Unknown Unknown Unknown Unknown      Medication List       Accurate as of May 30, 2020  5:25 PM. If you have any questions, ask your nurse or doctor.        STOP taking these medications   Trelegy Ellipta 100-62.5-25 MCG/INH Aepb Generic drug: Fluticasone-Umeclidin-Vilant Stopped by: Chesley Mires, MD     TAKE these medications   albuterol 108 (90 Base) MCG/ACT inhaler Commonly known as: VENTOLIN HFA Inhale 2 puffs into the lungs every 4 (four) hours as needed for wheezing.   aspirin EC 81 MG tablet Take 1 tablet (81 mg total) by mouth daily. Swallow whole.   Biotin 10000 MCG Tabs Take 10,000 mcg by mouth daily.   DULoxetine 60 MG capsule Commonly known as: CYMBALTA Take 60 mg by mouth daily.   ferrous sulfate 325 (65 FE) MG tablet Take 325 mg by mouth daily with breakfast.   flecainide 50 MG tablet Commonly known as: TAMBOCOR Take 1 tablet (50 mg total) by mouth 2 (two) times daily.   gabapentin 400 MG capsule Commonly known as: NEURONTIN Take 400 mg by mouth at bedtime.   ipratropium-albuterol 0.5-2.5 (3) MG/3ML Soln Commonly known as: DUONEB Take 3 mLs by nebulization.   losartan-hydrochlorothiazide 100-25 MG tablet Commonly known as: HYZAAR Take 1 tablet by mouth daily.   LUTEIN 20 PO Take 20 mg by mouth daily.   meloxicam 15 MG tablet Commonly known as: MOBIC Take 15 mg by mouth as needed for pain.   metFORMIN 500 MG tablet Commonly known as: GLUCOPHAGE Take 500 mg by mouth daily.   metoprolol succinate 25 MG 24 hr tablet Commonly known  as: Toprol XL Take 1 tablet (25 mg total) by mouth daily.   montelukast 10 MG tablet Commonly known as: SINGULAIR Take 10 mg by mouth at bedtime.   nitroGLYCERIN 0.4 MG SL tablet Commonly known as: NITROSTAT Place 1 tablet (0.4 mg total) under the tongue every 5 (five) minutes as needed.   OCUVITE-LUTEIN PO Take 1 tablet by mouth daily.   omega-3 fish oil 1000 MG Caps capsule Commonly known as: MAXEPA Take 1,000 mg by mouth daily.   omeprazole 20 MG capsule Commonly known as: PRILOSEC Take 20 mg by mouth 2 (two) times daily.   potassium chloride 10 MEQ tablet Commonly known as: KLOR-CON Take 10 mEq by mouth daily.   predniSONE 10 MG tablet Commonly known as: DELTASONE Take 10  mg by mouth daily with breakfast.   promethazine-dextromethorphan 6.25-15 MG/5ML syrup Commonly known as: PROMETHAZINE-DM Take 5 mLs by mouth at bedtime as needed for nausea.   rosuvastatin 5 MG tablet Commonly known as: CRESTOR Take 5 mg by mouth daily.   vitamin E 180 MG (400 UNITS) capsule Take 400 Units by mouth daily.   zinc gluconate 50 MG tablet Take 50 mg by mouth daily.       Signature:  Chesley Mires, MD [place] Pager - [phone] 05/30/2020, 5:25 PM

## 2020-05-31 ENCOUNTER — Ambulatory Visit (INDEPENDENT_AMBULATORY_CARE_PROVIDER_SITE_OTHER): Payer: Medicare Other | Admitting: Gastroenterology

## 2020-05-31 ENCOUNTER — Encounter: Payer: Self-pay | Admitting: Gastroenterology

## 2020-05-31 VITALS — BP 120/64 | HR 103 | Ht 65.0 in | Wt 210.0 lb

## 2020-05-31 DIAGNOSIS — R1310 Dysphagia, unspecified: Secondary | ICD-10-CM | POA: Diagnosis not present

## 2020-05-31 DIAGNOSIS — R945 Abnormal results of liver function studies: Secondary | ICD-10-CM | POA: Diagnosis not present

## 2020-05-31 DIAGNOSIS — K760 Fatty (change of) liver, not elsewhere classified: Secondary | ICD-10-CM | POA: Diagnosis not present

## 2020-05-31 DIAGNOSIS — R7989 Other specified abnormal findings of blood chemistry: Secondary | ICD-10-CM

## 2020-05-31 DIAGNOSIS — K769 Liver disease, unspecified: Secondary | ICD-10-CM

## 2020-05-31 DIAGNOSIS — K219 Gastro-esophageal reflux disease without esophagitis: Secondary | ICD-10-CM

## 2020-05-31 MED ORDER — PANTOPRAZOLE SODIUM 40 MG PO TBEC: 40.0000 mg | DELAYED_RELEASE_TABLET | Freq: Every day | ORAL | 6 refills | Status: DC

## 2020-05-31 NOTE — Patient Instructions (Signed)
If you are age 74 or older, your body mass index should be between 23-30. Your Body mass index is 34.95 kg/m. If this is out of the aforementioned range listed, please consider follow up with your Primary Care Provider.  If you are age 94 or younger, your body mass index should be between 19-25. Your Body mass index is 34.95 kg/m. If this is out of the aformentioned range listed, please consider follow up with your Primary Care Provider.   You will be in contact regarding labs and barium swallow from [facility].  We have sent the following medications to your pharmacy for you to pick up at your convenience: Protonix  Follow up in 3 months. Please call to schedule an appointment.  Call with any questions or concerns.   Thank you,  Dr. Jackquline Denmark

## 2020-05-31 NOTE — Progress Notes (Signed)
Chief Complaint:   Referring Provider:  Ronita Hipps, MD      ASSESSMENT AND PLAN;   #1. Abn LFTs s/o fatty liver. Recent pneumonia requiring antibiotics/steroids.  #2. GERD with occ dysphagia (neck area)  #3. IBS-D. Element of post chole diarrhea. No change or nocturnal symptoms. She has H/O polyps and is due for repeat colonoscopy.   Plan: -Ba swallow with Ba tab. Special attention to neck area. -Check CBC, CMP, acute viral hepatitis, autoimmune hepatitis panel (AMA, ASMA, iron studies and GGT. -Check anti-HAV total Ab and HBsAb.  If negative, would recommend vaccination for hepatitis A and B. -Change omeprazole to protonix $RemoveBef'40mg'LIIgVcjVcl$  po qd #30, 6 refills -CT AP if still with problems. -Instructed to chew foods esp meats and breads well and eat slowly.  She also has to be careful while taking doxycycline.  To take it in standing position with plenty of water. -FU in 12 weeks. At FU, consider EGD/colon.  At the present time I do not believe she can tolerate procedures.    HPI:    Heather Chavez is a 75 y.o. female  With multiple medical problems as detailed below including CAD, COPD, DM2, HLD, B/L Br Ca, HTN, GERD, anxiety/depression, obesity, macular degeneration Dx with Lymes disease last week by Dr. Kavin Leech doxycycline.  Recent pneumonia with COPD exacerbation with hypoxia, adm to [facility] 3/14-3/16/2022. CTA neg for PE, treated with ceftriaxone/prednisone/nebulizers.  Also had cardiac work-up as detailed below.  Sent to GI clinic for -Longstanding H/O choking-intermittent dysphagia with occasional food getting hung up in the neck area.  Some nausea.  She does complain of heartburn despite omeprazole which she has been on it for last several years.  No odynophagia.  She does complain of sore throat.  No oral thrush on exam.  She has been to pulmonary recently.  Still recovering from recent hospitalization.  -Abnormal LFTs as below.  She is s/p cholecystectomy in the  past.  This is thought to be due to fatty liver.  -Also with longstanding history of intermittent diarrhea ever since her cholecystectomy.  No melena or hematochezia.  No weight loss.  Softer bowel movements 1/day.  No nocturnal symptoms.  Has not worsened since recent pneumonia.    Previous GI/other work-up: Patient brings in extensive records Labs Apr 22, 2020 -Fasting lipid profile 04/2020: chol 245, LDL 134, TG 302 -CBC showed hemoglobin 11.6, MCV 84, platelets 232 -CMP AST 18, ALT 17, alk phos 61, albumin 4.1, TB 0.4 -Hemoglobin A1c 7.1  Labs March 16/2022 -LFTs TB 0.4, AST 56, ALT 69, alk phos 181, albumin 3.8 -She did have pneumonia, negative flu test, COVID-19 -CBC hemoglobin 11.0, MCV 85, platelets 255 -Normal PT 11.0 INR 1.0, PTT 20  Chest pain 05/21/2020 -Negative Lexiscan nuclear stress test. EF greater than 70%. -CTA chest negative except for stable scarring in the right middle lobe -Ultrasound right upper quadrant 05/20/2020: S/p cholecystectomy, normal otherwise.  Doppler imaging with normal directional blood flow.  Other GI work-up Colonoscopy 04/2015 ([facility]) -Colonic polyps s/p polypectomy. Bx- neg -Moderate to severe sigmoid diverticulosis -Repeat in 5 years d/t previous history of tubulo-villous adenoma 2006  EGD 06/2012 [facility] Incidental gastric polyps s/p polypectomy x3. Bx-fundic gland polyps Neg CLO  Past Medical History:  Diagnosis Date  . Advanced nonexudative age-related macular degeneration of right eye with subfoveal involvement 09/21/2019  . Allergic rhinitis   . Angina pectoris ([place]) 05/17/2020  . Bilateral breast cancer ([place]) 11/23/2013  . BMI 34.0-34.9,adult  09/25/2019  . Breast cancer ([place])   . Bronchitis, chronic ([place])   . CAD (coronary artery disease)   . Cancer ([place])   . Cardiac murmur 05/17/2020  . Coronary artery calcification 05/17/2020  . Degenerative retinal drusen of right eye 09/21/2019  . Diabetes mellitus due to underlying condition  with unspecified complications ([place]) 0/94/7096  . Diverticulosis   . Dry eyes 06/25/2016  . Dyspnea   . Emphysema/COPD ([place])   . Essential hypertension 05/17/2020  . Estrogen receptor positive neoplasm 09/24/2015  . Family history of malignant neoplasm of breast 11/23/2013  . Family history of peritoneal cancer 11/23/2013  . Fuchs' corneal dystrophy 11/03/2016  . GERD (gastroesophageal reflux disease)   . History of colon polyps   . History of right breast cancer 09/19/2018  . Hypertension   . IBS (irritable bowel syndrome)    with D  . Intermediate stage nonexudative age-related macular degeneration of left eye 09/21/2019  . Iritis 11/03/2016  . Macular degeneration   . Major depressive disorder   . Malignant neoplasm of upper-inner quadrant of left breast in female, estrogen receptor positive ([place]) 09/01/2016  . Mixed dyslipidemia 05/17/2020  . Obesity   . Obesity (BMI 35.0-39.9 without comorbidity) 05/17/2020  . Personal history of chemotherapy   . Personal history of radiation therapy   . Pseudophakia 09/21/2019  . Right-sided chest wall pain   . Varicose veins of left lower extremity with complications 04/16/3660    Past Surgical History:  Procedure Laterality Date  . BREAST BIOPSY    . BREAST LUMPECTOMY     right brwast 2005/ left 2015  . CATARACT EXTRACTION    . CHOLECYSTECTOMY    . COLONOSCOPY  04/17/2015   Colonic polyps status post polypectomy. Moderate to severe sigmoid diverticulosis  . TUBAL LIGATION    . VAGINAL HYSTERECTOMY    . VEIN LIGATION AND STRIPPING      Family History  Problem Relation Age of Onset  . Cancer Mother 64       bilateral breast cancer ages 67 then 52. Peritoneal cancer at age 57.  . Other Mother        Ms. Maino's mother was adopted so have no information regarding maternal family  history other than her mother  . Breast cancer Mother   . Cancer Father 57       renal  . Cancer Sister 63       breast  . Breast cancer Sister   . Cancer  Paternal Aunt 87       breast  . Cancer Paternal Grandmother        bilateral breast cancer at unknown age    Social History   Tobacco Use  . Smoking status: Former Smoker    Packs/day: 2.50    Years: 15.00    Pack years: 37.50    Types: Cigarettes    Quit date: 03/09/1981    Years since quitting: 39.2  . Smokeless tobacco: Never Used  Vaping Use  . Vaping Use: Never used  Substance Use Topics  . Alcohol use: Yes  . Drug use: No    Current Outpatient Medications  Medication Sig Dispense Refill  . albuterol (VENTOLIN HFA) 108 (90 Base) MCG/ACT inhaler Inhale 2 puffs into the lungs every 4 (four) hours as needed for wheezing.    . Biotin 10000 MCG TABS Take 10,000 mcg by mouth daily.    . DULoxetine (CYMBALTA) 60 MG capsule Take 60 mg by mouth daily.    Marland Kitchen  ferrous sulfate 325 (65 FE) MG tablet Take 325 mg by mouth daily with breakfast.    . flecainide (TAMBOCOR) 50 MG tablet Take 1 tablet (50 mg total) by mouth 2 (two) times daily. 60 tablet 2  . gabapentin (NEURONTIN) 400 MG capsule Take 400 mg by mouth at bedtime.    Marland Kitchen losartan-hydrochlorothiazide (HYZAAR) 100-25 MG tablet Take 1 tablet by mouth daily.    . meloxicam (MOBIC) 15 MG tablet Take 15 mg by mouth as needed for pain.    . metFORMIN (GLUCOPHAGE) 500 MG tablet Take 500 mg by mouth daily.    . metoprolol succinate (TOPROL XL) 25 MG 24 hr tablet Take 1 tablet (25 mg total) by mouth daily. 30 tablet 6  . Misc Natural Products (LUTEIN 20 PO) Take 20 mg by mouth daily.    . montelukast (SINGULAIR) 10 MG tablet Take 10 mg by mouth at bedtime.    . Multiple Vitamins-Minerals (OCUVITE-LUTEIN PO) Take 1 tablet by mouth daily.    . nitroGLYCERIN (NITROSTAT) 0.4 MG SL tablet Place 1 tablet (0.4 mg total) under the tongue every 5 (five) minutes as needed. 25 tablet 6  . omega-3 fish oil (MAXEPA) 1000 MG CAPS capsule Take 1,000 mg by mouth daily.    Marland Kitchen omeprazole (PRILOSEC) 20 MG capsule Take 20 mg by mouth 2 (two) times daily.    .  potassium chloride (KLOR-CON) 10 MEQ tablet Take 10 mEq by mouth daily.    . vitamin E 180 MG (400 UNITS) capsule Take 400 Units by mouth daily.    Marland Kitchen zinc gluconate 50 MG tablet Take 50 mg by mouth daily.    Marland Kitchen aspirin EC 81 MG tablet Take 1 tablet (81 mg total) by mouth daily. Swallow whole. (Patient not taking: Reported on 05/31/2020) 90 tablet 3  . rosuvastatin (CRESTOR) 5 MG tablet Take 5 mg by mouth daily. (Patient not taking: Reported on 05/31/2020)     No current facility-administered medications for this visit.    Allergies  Allergen Reactions  . Codeine Other (See Comments) and Nausea And Vomiting    Other reaction(s): Other (see comments), Vomiting  . Acetaminophen Other (See Comments)    Unknown Unknown Other reaction(s): Eye Redness, Other (see comments) Unknown Unknown Unknown Unknown Unknown Unknown Unknown Unknown Unknown     Review of Systems:  Constitutional: Denies fever, chills, diaphoresis, appetite change and has fatigue.  HEENT: has allergies   Respiratory: has SOB, DOE, cough, chest tightness,  and occ wheezing.   Cardiovascular: Denies chest pain, palpitations and leg swelling.  Genitourinary: Denies dysuria, urgency, frequency, hematuria, flank pain and difficulty urinating.  Musculoskeletal: Denies myalgias, back pain, joint swelling, arthralgias and gait problem.  Skin: No rash.  Neurological: Denies dizziness, seizures, syncope, weakness, light-headedness, numbness and headaches.  Hematological: Denies adenopathy. Easy bruising, personal or family bleeding history  Psychiatric/Behavioral: Has anxiety or depression     Physical Exam:    BP 120/64   Pulse (!) 103   Ht $R'5\' 5"'Ri$  (1.651 m)   Wt 210 lb (95.3 kg)   BMI 34.95 kg/m  Wt Readings from Last 3 Encounters:  05/31/20 210 lb (95.3 kg)  05/30/20 211 lb 3.2 oz (95.8 kg)  05/27/20 210 lb 12.8 oz (95.6 kg)   Constitutional:  Well-developed, in no acute distress. Psychiatric: Normal mood and  affect. Behavior is normal. HEENT: Pupils normal.  Conjunctivae are normal. No scleral icterus. Neck supple.  Cardiovascular: Normal rate, regular rhythm. No edema Pulmonary/chest: Effort normal and  breath sounds-decreased. No wheezing, rales or rhonchi. Abdominal: Soft, nondistended. Nontender. Bowel sounds active throughout. There are no masses palpable. No hepatomegaly. Rectal: Deferred Neurological: Alert and oriented to person place and time. Skin: Skin is warm and dry. No rashes noted.  Data Reviewed: I have personally reviewed following labs and imaging studies  Extensive records were reviewed as above  CBC: CBC 02/01/2007  Hemoglobin 13.2  Hematocrit 38.0    CMP: CMP 02/01/2007  Glucose 113(H)  BUN 13  Creatinine 0.61  Sodium 142  Potassium 4.4  Chloride 107  CO2 28  Calcium 9.6      Carmell Austria, MD 05/31/2020, 3:55 PM  Cc: Ronita Hipps, MD

## 2020-06-06 DIAGNOSIS — R1310 Dysphagia, unspecified: Secondary | ICD-10-CM | POA: Diagnosis not present

## 2020-06-06 DIAGNOSIS — Z853 Personal history of malignant neoplasm of breast: Secondary | ICD-10-CM | POA: Diagnosis not present

## 2020-06-06 DIAGNOSIS — K219 Gastro-esophageal reflux disease without esophagitis: Secondary | ICD-10-CM | POA: Diagnosis not present

## 2020-06-06 DIAGNOSIS — R769 Abnormal immunological finding in serum, unspecified: Secondary | ICD-10-CM | POA: Diagnosis not present

## 2020-06-06 DIAGNOSIS — R945 Abnormal results of liver function studies: Secondary | ICD-10-CM | POA: Diagnosis not present

## 2020-06-06 DIAGNOSIS — R0602 Shortness of breath: Secondary | ICD-10-CM | POA: Diagnosis not present

## 2020-06-10 DIAGNOSIS — Z01812 Encounter for preprocedural laboratory examination: Secondary | ICD-10-CM | POA: Diagnosis not present

## 2020-06-12 ENCOUNTER — Telehealth: Payer: Self-pay | Admitting: Pulmonary Disease

## 2020-06-12 ENCOUNTER — Other Ambulatory Visit: Payer: Medicare Other

## 2020-06-12 DIAGNOSIS — J986 Disorders of diaphragm: Secondary | ICD-10-CM

## 2020-06-12 NOTE — Telephone Encounter (Signed)
Called and went over Dr Juanetta Gosling results and recommendations with patient. All questions answered and patient expressed full understanding and agreeable to order being place. Orders placed per Dr Halford Chessman. Nothing further needed at this time.

## 2020-06-12 NOTE — Telephone Encounter (Signed)
CXR 05/22/20 >> chronic Rt diaphragm elevation  CT chest 05/20/20 >> scarring in RML and lingula   Please let her know chest imaging reports from [facility] reviewed.  She has chronic mild scarring in her lungs.  She also has elevation of her right diaphragm.  Please schedule her for a Sniff test to further assess right diaphragm function since this could be contributing there her respiratory symptoms.

## 2020-06-14 ENCOUNTER — Other Ambulatory Visit: Payer: Self-pay

## 2020-06-17 ENCOUNTER — Other Ambulatory Visit: Payer: Self-pay

## 2020-06-17 ENCOUNTER — Encounter: Payer: Self-pay | Admitting: Cardiology

## 2020-06-17 ENCOUNTER — Ambulatory Visit (INDEPENDENT_AMBULATORY_CARE_PROVIDER_SITE_OTHER): Payer: Medicare Other | Admitting: Cardiology

## 2020-06-17 VITALS — BP 122/66 | HR 105 | Ht 65.0 in | Wt 209.0 lb

## 2020-06-17 DIAGNOSIS — I10 Essential (primary) hypertension: Secondary | ICD-10-CM

## 2020-06-17 DIAGNOSIS — I2510 Atherosclerotic heart disease of native coronary artery without angina pectoris: Secondary | ICD-10-CM | POA: Diagnosis not present

## 2020-06-17 DIAGNOSIS — I209 Angina pectoris, unspecified: Secondary | ICD-10-CM | POA: Diagnosis not present

## 2020-06-17 DIAGNOSIS — M7989 Other specified soft tissue disorders: Secondary | ICD-10-CM | POA: Diagnosis not present

## 2020-06-17 DIAGNOSIS — I471 Supraventricular tachycardia: Secondary | ICD-10-CM

## 2020-06-17 DIAGNOSIS — Z20822 Contact with and (suspected) exposure to covid-19: Secondary | ICD-10-CM | POA: Diagnosis not present

## 2020-06-17 DIAGNOSIS — I82812 Embolism and thrombosis of superficial veins of left lower extremities: Secondary | ICD-10-CM | POA: Diagnosis not present

## 2020-06-17 LAB — BASIC METABOLIC PANEL
BUN/Creatinine Ratio: 24 (ref 12–28)
BUN: 23 mg/dL (ref 8–27)
CO2: 24 mmol/L (ref 20–29)
Calcium: 9.8 mg/dL (ref 8.7–10.3)
Chloride: 102 mmol/L (ref 96–106)
Creatinine, Ser: 0.96 mg/dL (ref 0.57–1.00)
Glucose: 114 mg/dL — ABNORMAL HIGH (ref 65–99)
Potassium: 4.5 mmol/L (ref 3.5–5.2)
Sodium: 141 mmol/L (ref 134–144)
eGFR: 62 mL/min/{1.73_m2} (ref >59)

## 2020-06-17 MED ORDER — METOPROLOL SUCCINATE ER 25 MG PO TB24: 25.0000 mg | ORAL_TABLET | Freq: Two times a day (BID) | ORAL | 6 refills | Status: DC

## 2020-06-17 NOTE — Patient Instructions (Signed)
Medication Instructions:  Your physician has recommended you make the following change in your medication:   Take 1/2 tablet of Losartan potassium/HCTZ. Increase your metoprolol to 25 mg twice daily.  *If you need a refill on your cardiac medications before your next appointment, please call your pharmacy*   Lab Work: None ordered If you have labs (blood work) drawn today and your tests are completely normal, you will receive your results only by: Marland Kitchen MyChart Message (if you have MyChart) OR . A paper copy in the mail If you have any lab test that is abnormal or we need to change your treatment, we will call you to review the results.   Testing/Procedures: We have ordered a DVT study at [facility].   Follow-Up: At [facility], you and your health needs are our priority.  As part of our continuing mission to provide you with exceptional heart care, we have created designated Provider Care Teams.  These Care Teams include your primary Cardiologist (physician) and Advanced Practice Providers (APPs -  Physician Assistants and Nurse Practitioners) who all work together to provide you with the care you need, when you need it.  We recommend signing up for the patient portal called "MyChart".  Sign up information is provided on this After Visit Summary.  MyChart is used to connect with patients for Virtual Visits (Telemedicine).  Patients are able to view lab/test results, encounter notes, upcoming appointments, etc.  Non-urgent messages can be sent to your provider as well.   To learn more about what you can do with MyChart, go to [email or web address].    Your next appointment:   1 month(s)  The format for your next appointment:   In Person  Provider:   Jyl Heinz, MD   Other Instructions  Deep Vein Thrombosis  Deep vein thrombosis (DVT) is a condition in which a blood clot forms in a deep vein, such as a vein in the lower leg, thigh, pelvis, or arm. Deep veins are  veins in the deep venous system. A clot is blood that has thickened into a gel or solid. This condition is serious and can be life-threatening if the clot travels to the lungs and causes a blockage (pulmonary embolism) in the arteries of the lung. A DVT can also damage veins in the leg. This can lead to long-term, or chronic, venous disease, leg pain, swelling, discoloration, and ulcers or sores (post-thrombotic syndrome). What are the causes? This condition may be caused by:  A slowdown of blood flow.  Damage to a vein.  A condition that causes blood to clot more easily, such as certain blood-clotting disorders. What increases the risk? The following factors may make you more likely to develop this condition:  Having obesity.  Being older, especially older than age 31.  Being inactive (sedentary lifestyle) or not moving around. This may include: ? Sitting or lying down for longer than 4-6 hours other than to sleep at night. ? Being in the hospital, having major or lengthy surgery, or having a thin, flexible tube (central line catheter) placed in a large vein.  Being pregnant, giving birth, or having recently given birth.  Taking medicines that contain estrogen, such as birth control or hormone replacement therapy.  Using products that contain nicotine or tobacco, especially if you use hormonal birth control.  Having a history of blood clots or a blood-clotting disease, a blood vessel disease (peripheral vascular disease), or congestive heart disease.  Having a history of cancer, especially if being  treated with chemotherapy. What are the signs or symptoms? Symptoms of this condition include:  Swelling, pain, pressure, or tenderness in an arm or a leg.  An arm or a leg becoming warm, red, or discolored.  A leg turning very pale. You may have a large DVT. This is rare. If the clot is in your leg, you may notice symptoms more or have worse symptoms when you stand or walk. In some  cases, there are no symptoms. How is this diagnosed? This condition is diagnosed with:  Your medical history and a physical exam.  Tests, such as: ? Blood tests to check how well your blood clots. ? Doppler ultrasound. This is the best way to find a DVT. ? Venogram. Contrast dye is injected into a vein, and X-rays are taken to check for clots. How is this treated? Treatment for this condition depends on:  The cause of your DVT.  The size and location of your DVT, or having more than one DVT.  Your risk for bleeding or developing more clots.  Other medical conditions you may have. Treatment may include:  Taking a blood thinner, also called an anticoagulant, to prevent clots from forming and growing.  Wearing compression stockings, if directed.  Injecting medicines into the affected vein to break up the clot (catheter-directed thrombolysis). This is used only for severe DVT and only if a specialist recommends it.  Specific surgical procedures, when DVT is severe or hard to treat. These may be done to: ? Isolate and remove your clot. ? Place an inferior vena cava (IVC) filter in a large vein to catch blood clots before they reach your lungs. You may get some medical treatments for 6 months or longer. Follow these instructions at home: If you are taking blood thinners:  Talk with your health care provider before you take any medicines that contain aspirin or NSAIDs, such as ibuprofen. These medicines increase your risk for dangerous bleeding.  Take your medicine exactly as told, at the same time every day. Do not skip a dose. Do not take more than the prescribed dose. This is important.  Ask your health care provider about foods and medicines that could change the way your blood thinner works (may interact). Avoid these foods and medicines if you are told to do so.  Avoid anything that may cause bleeding or bruising. You may bleed more easily while taking blood thinners. ? Be  very careful when using knives, scissors, or other sharp objects. ? Use an electric razor instead of a blade. ? Avoid activities that could cause injury or bruising, and follow instructions for preventing falls. ? Tell your health care provider if you have had any internal bleeding, bleeding ulcers, or neurologic diseases, such as strokes or cerebral aneurysms.  Wear a medical alert bracelet or carry a card that lists what medicines you take. General instructions  Take over-the-counter and prescription medicines only as told by your health care provider.  Return to your normal activities as told by your health care provider. Ask your health care provider what activities are safe for you.  If recommended, wear compression stockings as told by your health care provider. These stockings help to prevent blood clots and reduce swelling in your legs.  Keep all follow-up visits as told by your health care provider. This is important. Contact a health care provider if:  You miss a dose of your blood thinner.  You have new or worse pain, swelling, or redness in an arm  or a leg.  You have worsening numbness or tingling in an arm or a leg.  You have unusual bruising. Get help right away if:  You have signs or symptoms that a blood clot has moved to the lungs. These may include: ? Shortness of breath. ? Chest pain. ? Fast or irregular heartbeats (palpitations). ? Light-headedness or dizziness. ? Coughing up blood.  You have signs or symptoms that your blood is too thin. These may include: ? Blood in your vomit, stool, or urine. ? A cut that will not stop bleeding. ? A menstrual period that is heavier than usual. ? A severe headache or confusion. These symptoms may represent a serious problem that is an emergency. Do not wait to see if the symptoms will go away. Get medical help right away. Call your local emergency services (911 in the [place] Do not drive yourself to the  hospital. Summary  Deep vein thrombosis (DVT) happens when a blood clot forms in a deep vein. This may occur in the lower leg, thigh, pelvis, or arm.  Symptoms affect the arm or leg and can include swelling, pain, tenderness, warmth, redness, or discoloration.  This condition may be treated with medicines or compression stockings. In severe cases, surgery may be done.  If you are taking blood thinners, take them exactly as told. Do not skip a dose. Do not take more than is prescribed.  Get help right away if you have shortness of breath, chest pain, fast or irregular heartbeats, or blood in your vomit, urine, or stool. This information is not intended to replace advice given to you by your health care provider. Make sure you discuss any questions you have with your health care provider. Document Revised: 02/18/2019 Document Reviewed: 02/18/2019 Elsevier Patient Education  2021 [employer].

## 2020-06-17 NOTE — Addendum Note (Signed)
Addended by: Truddie Hidden on: 06/17/2020 10:55 AM   Modules accepted: Orders

## 2020-06-17 NOTE — Progress Notes (Signed)
Cardiology Office Note:    Date:  06/17/2020   ID:  Heather Chavez, DOB 06/23/1945, MRN [id number]  PCP:  Ronita Hipps, MD  Cardiologist:  Jenean Lindau, MD   Referring MD: Ronita Hipps, MD    ASSESSMENT:    1. Coronary artery disease involving native coronary artery of native heart without angina pectoris   2. Essential hypertension   3. SVT (supraventricular tachycardia) (HCC)    PLAN:    In order of problems listed above:  1. Paroxysmal supraventricular tachycardia: Patient mentions to me that palpitations are much better with flecainide.  I am happy about it.  Her EKG today was unremarkable and QRS is within normal limits.  Because of resting tachycardia I increased her beta-blocker metoprolol succinate to 50 mg daily.  I have decreased losartan hydrochlorothiazide to half of the dose.  This is next to make sure that she does not get hypotensive.  She will have a Chem-7 today. 2. Patient is on flecainide therapy.  She denies any chest pain or any such symptoms.  I want to make sure that she has no significant obstructive coronary artery disease and will do a CT FFR and this is pending. 3. Essential hypertension: Blood pressure stable and diet was emphasized. 4. Coronary artery calcification: Secondary prevention stressed.  Lipids managed by primary care physician. Obesity: Weight reduction was stressed and she promises to do better. Follow-up appointment in a month or earlier if she has any concerns. Her right leg is bigger than the left.  She tells me that this has been happening for a long time but is a little worse in the past couple of weeks.  We will do a DVT study as a precaution.  She knows to go to the nearest emergency room for any concerning symptoms.   Medication Adjustments/Labs and Tests Ordered: Current medicines are reviewed at length with the patient today.  Concerns regarding medicines are outlined above.  No orders of the defined types were placed  in this encounter.  No orders of the defined types were placed in this encounter.    No chief complaint on file.    History of Present Illness:    Heather Chavez is a 75 y.o. female.  Patient has past medical history of supraventricular tachycardia, coronary calcifications found on CT scan in the past, essential hypertension and diabetes mellitus.  She denies any problems at this time and takes care of activities of daily living.  No chest pain orthopnea or PND.  She does not ambulate much because of orthopedic issues with her left hip.  At the time of my evaluation, the patient is alert awake oriented and in no distress.  Past Medical History:  Diagnosis Date  . Advanced nonexudative age-related macular degeneration of right eye with subfoveal involvement 09/21/2019  . Allergic rhinitis   . Angina pectoris ([place]) 05/17/2020  . Bilateral breast cancer ([place]) 11/23/2013  . BMI 34.0-34.9,adult 09/25/2019  . Breast cancer ([place])   . Bronchitis, chronic ([place])   . CAD (coronary artery disease)   . Cancer ([place])   . Cardiac murmur 05/17/2020  . Coronary artery calcification 05/17/2020  . Degenerative retinal drusen of right eye 09/21/2019  . Diabetes mellitus due to underlying condition with unspecified complications ([place]) 3/71/0626  . Diverticulosis   . Dry eyes 06/25/2016  . Dyspnea   . Emphysema/COPD ([place])   . Essential hypertension 05/17/2020  . Estrogen receptor positive neoplasm 09/24/2015  . Family history of  malignant neoplasm of breast 11/23/2013  . Family history of peritoneal cancer 11/23/2013  . Fuchs' corneal dystrophy 11/03/2016  . GERD (gastroesophageal reflux disease)   . History of colon polyps   . History of right breast cancer 09/19/2018  . Hypertension   . IBS (irritable bowel syndrome)    with D  . Intermediate stage nonexudative age-related macular degeneration of left eye 09/21/2019  . Iritis 11/03/2016  . Macular degeneration   . Major depressive disorder   .  Malignant neoplasm of upper-inner quadrant of left breast in female, estrogen receptor positive ([employer]) 09/01/2016  . Mixed dyslipidemia 05/17/2020  . Obesity   . Obesity (BMI 35.0-39.9 without comorbidity) 05/17/2020  . Personal history of chemotherapy   . Personal history of radiation therapy   . Pseudophakia 09/21/2019  . Right-sided chest wall pain   . SVT (supraventricular tachycardia) ([place]) 05/23/2020  . Varicose veins of left lower extremity with complications 09/11/6431    Past Surgical History:  Procedure Laterality Date  . BREAST BIOPSY    . BREAST LUMPECTOMY     right brwast 2005/ left 2015  . CATARACT EXTRACTION    . CHOLECYSTECTOMY    . COLONOSCOPY  04/17/2015   Colonic polyps status post polypectomy. Moderate to severe sigmoid diverticulosis  . TUBAL LIGATION    . VAGINAL HYSTERECTOMY    . VEIN LIGATION AND STRIPPING      Current Medications: Current Meds  Medication Sig  . albuterol (VENTOLIN HFA) 108 (90 Base) MCG/ACT inhaler Inhale 2 puffs into the lungs every 4 (four) hours as needed for wheezing.  [place] Kitchen aspirin EC 81 MG tablet Take 1 tablet (81 mg total) by mouth daily. Swallow whole.  . Biotin 10000 MCG TABS Take 10,000 mcg by mouth daily.  Mar[place] Kitchen doxycycline (VIBRAMYCIN) 100 MG capsule Take 100 mg by mouth 2 (two) times daily.  . DULoxetine (CYMBALTA) 60 MG capsule Take 60 mg by mouth daily.  . flecainide (TAMBOCOR) 50 MG tablet Take 1 tablet (50 mg total) by mouth 2 (two) times daily.  Marland Kitchen gabapentin (NEURONTIN) 300 MG capsule Take 1 capsule by mouth at bedtime.  Marland Kitchen losartan-hydrochlorothiazide (HYZAAR) 100-25 MG tablet Take 1 tablet by mouth daily.  . meloxicam (MOBIC) 15 MG tablet Take 15 mg by mouth daily.  . metFORMIN (GLUCOPHAGE) 500 MG tablet Take 500 mg by mouth daily.  . metoprolol succinate (TOPROL XL) 25 MG 24 hr tablet Take 1 tablet (25 mg total) by mouth daily.  . montelukast (SINGULAIR) 10 MG tablet Take 10 mg by mouth at bedtime.  . Multiple Vitamins-Minerals  (OCUVITE-LUTEIN PO) Take 1 tablet by mouth daily.  . nitroGLYCERIN (NITROSTAT) 0.4 MG SL tablet Place 0.4 mg under the tongue every 5 (five) minutes as needed for chest pain.  Marland Kitchen omega-3 fish oil (MAXEPA) 1000 MG CAPS capsule Take 1,000 mg by mouth daily.  . pantoprazole (PROTONIX) 40 MG tablet Take 1 tablet (40 mg total) by mouth daily.  . potassium chloride (KLOR-CON) 10 MEQ tablet Take 10 mEq by mouth daily.  . vitamin E 180 MG (400 UNITS) capsule Take 400 Units by mouth daily.  Marland Kitchen zinc gluconate 50 MG tablet Take 50 mg by mouth daily.     Allergies:   Codeine and Acetaminophen   Social History   Socioeconomic History  . Marital status: Married    Spouse name: Not on file  . Number of children: Not on file  . Years of education: Not on file  . Highest education level:  Not on file  Occupational History  . Not on file  Tobacco Use  . Smoking status: Former Smoker    Packs/day: 2.50    Years: 15.00    Pack years: 37.50    Types: Cigarettes    Quit date: 03/09/1981    Years since quitting: 39.3  . Smokeless tobacco: Never Used  Vaping Use  . Vaping Use: Never used  Substance and Sexual Activity  . Alcohol use: Yes  . Drug use: No  . Sexual activity: Not on file  Other Topics Concern  . Not on file  Social History Narrative  . Not on file   Social Determinants of Health   Financial Resource Strain: Not on file  Food Insecurity: Not on file  Transportation Needs: Not on file  Physical Activity: Not on file  Stress: Not on file  Social Connections: Not on file     Family History: The patient's family history includes Breast cancer in her mother and sister; Cancer in her paternal grandmother; Cancer (age of onset: 70) in her sister; Cancer (age of onset: 57) in her mother and paternal aunt; Cancer (age of onset: 44) in her father; Other in her mother.  ROS:   Please see the history of present illness.    All other systems reviewed and are negative.  EKGs/Labs/Other  Studies Reviewed:    The following studies were reviewed today: EKG with sinus tachycardia and nonspecific ST changes   Recent Labs: No results found for requested labs within last 8760 hours.  Recent Lipid Panel No results found for: CHOL, TRIG, HDL, CHOLHDL, VLDL, LDLCALC, LDLDIRECT  Physical Exam:    VS:  BP 122/66   Pulse (!) 105   Ht 5\' 5"  (1.651 m)   Wt 209 lb (94.8 kg)   SpO2 96%   BMI 34.78 kg/m     Wt Readings from Last 3 Encounters:  06/17/20 209 lb (94.8 kg)  05/31/20 210 lb (95.3 kg)  05/30/20 211 lb 3.2 oz (95.8 kg)     GEN: Patient is in no acute distress HEENT: Normal NECK: No JVD; No carotid bruits LYMPHATICS: No lymphadenopathy CARDIAC: Hear sounds regular, 2/6 systolic murmur at the apex. RESPIRATORY:  Clear to auscultation without rales, wheezing or rhonchi  ABDOMEN: Soft, non-tender, non-distended MUSCULOSKELETAL: Mild bilateral pedal edema but right side is significantly more than left; No deformity  SKIN: Warm and dry NEUROLOGIC:  Alert and oriented x 3 PSYCHIATRIC:  Normal affect   Signed, Jenean Lindau, MD  06/17/2020 10:25 AM    [place]

## 2020-06-18 ENCOUNTER — Other Ambulatory Visit: Payer: Self-pay

## 2020-06-18 DIAGNOSIS — I83892 Varicose veins of left lower extremities with other complications: Secondary | ICD-10-CM

## 2020-06-18 DIAGNOSIS — M7989 Other specified soft tissue disorders: Secondary | ICD-10-CM

## 2020-06-19 ENCOUNTER — Ambulatory Visit (HOSPITAL_COMMUNITY): Payer: Medicare Other

## 2020-06-20 ENCOUNTER — Ambulatory Visit (INDEPENDENT_AMBULATORY_CARE_PROVIDER_SITE_OTHER): Payer: Medicare Other | Admitting: Pulmonary Disease

## 2020-06-20 ENCOUNTER — Ambulatory Visit: Payer: Medicare Other

## 2020-06-20 ENCOUNTER — Other Ambulatory Visit: Payer: Self-pay

## 2020-06-20 DIAGNOSIS — I2510 Atherosclerotic heart disease of native coronary artery without angina pectoris: Secondary | ICD-10-CM | POA: Diagnosis not present

## 2020-06-20 DIAGNOSIS — R0602 Shortness of breath: Secondary | ICD-10-CM

## 2020-06-20 DIAGNOSIS — Z9181 History of falling: Secondary | ICD-10-CM | POA: Diagnosis not present

## 2020-06-20 DIAGNOSIS — R600 Localized edema: Secondary | ICD-10-CM | POA: Diagnosis not present

## 2020-06-20 DIAGNOSIS — R0683 Snoring: Secondary | ICD-10-CM

## 2020-06-20 DIAGNOSIS — Z6834 Body mass index (BMI) 34.0-34.9, adult: Secondary | ICD-10-CM | POA: Diagnosis not present

## 2020-06-20 LAB — PULMONARY FUNCTION TEST
DL/VA % pred: 90 %
DL/VA: 3.71 ml/min/mmHg/L
DLCO cor % pred: 62 %
DLCO cor: 12.53 ml/min/mmHg
DLCO unc % pred: 62 %
DLCO unc: 12.53 ml/min/mmHg
FEF 25-75 Post: 2.01 L/sec
FEF 25-75 Pre: 2.06 L/sec
FEF2575-%Change-Post: -2 %
FEF2575-%Pred-Post: 110 %
FEF2575-%Pred-Pre: 113 %
FEV1-%Change-Post: 1 %
FEV1-%Pred-Post: 75 %
FEV1-%Pred-Pre: 75 %
FEV1-Post: 1.72 L
FEV1-Pre: 1.7 L
FEV1FVC-%Change-Post: -1 %
FEV1FVC-%Pred-Pre: 113 %
FEV6-%Change-Post: 2 %
FEV6-%Pred-Post: 71 %
FEV6-%Pred-Pre: 69 %
FEV6-Post: 2.04 L
FEV6-Pre: 1.99 L
FEV6FVC-%Pred-Post: 104 %
FEV6FVC-%Pred-Pre: 104 %
FVC-%Change-Post: 2 %
FVC-%Pred-Post: 67 %
FVC-%Pred-Pre: 65 %
FVC-Post: 2.04 L
FVC-Pre: 1.99 L
Post FEV1/FVC ratio: 84 %
Post FEV6/FVC ratio: 100 %
Pre FEV1/FVC ratio: 86 %
Pre FEV6/FVC Ratio: 100 %
RV % pred: 86 %
RV: 1.99 L
TLC % pred: 77 %
TLC: 4.05 L

## 2020-06-20 NOTE — Patient Instructions (Signed)
Full PFT performed today. °

## 2020-06-20 NOTE — Progress Notes (Signed)
Full PFT performed today. °

## 2020-06-21 ENCOUNTER — Ambulatory Visit (HOSPITAL_COMMUNITY): Payer: Medicare Other

## 2020-06-21 DIAGNOSIS — G4733 Obstructive sleep apnea (adult) (pediatric): Secondary | ICD-10-CM | POA: Diagnosis not present

## 2020-06-27 ENCOUNTER — Other Ambulatory Visit (HOSPITAL_COMMUNITY): Payer: Self-pay | Admitting: Emergency Medicine

## 2020-06-27 ENCOUNTER — Telehealth: Payer: Self-pay | Admitting: Pulmonary Disease

## 2020-06-27 DIAGNOSIS — G4733 Obstructive sleep apnea (adult) (pediatric): Secondary | ICD-10-CM | POA: Diagnosis not present

## 2020-06-27 MED ORDER — IVABRADINE HCL 5 MG PO TABS: 10.0000 mg | ORAL_TABLET | Freq: Once | ORAL | 0 refills | Status: AC

## 2020-06-27 NOTE — Telephone Encounter (Signed)
Called and went over HST results per Dr Halford Chessman with patient. All questions answered and patient expressed full understanding. Confirmed upcoming scheduled office visit with Dr Halford Chessman on 07/12/2020 at noon. Nothing further needed at this time.

## 2020-06-27 NOTE — Progress Notes (Signed)
One time dose 10mg  ivabradine for cardiac CTA HR control prescribed.  Marchia Bond RN Navigator Cardiac Imaging [facility] [phone] Office  [phone] Cell

## 2020-06-27 NOTE — Telephone Encounter (Signed)
HST 06/21/20 >> AHI 21.4, SpO2 71%  Please inform her that her sleep study shows moderate obstructive sleep apnea.  Will review in more detail at her ROV on 07/12/20.

## 2020-06-28 ENCOUNTER — Telehealth (HOSPITAL_COMMUNITY): Payer: Self-pay | Admitting: Emergency Medicine

## 2020-06-28 ENCOUNTER — Encounter (HOSPITAL_COMMUNITY): Payer: Self-pay

## 2020-06-28 NOTE — Telephone Encounter (Signed)
Reaching out to patient to offer assistance regarding upcoming cardiac imaging study; pt verbalizes understanding of appt date/time, parking situation and where to check in, pre-test NPO status and medications ordered, and verified current allergies; name and call back number provided for further questions should they arise Heather Bond RN Navigator Cardiac Imaging [facility] [phone] office [phone] cell  50mg  metop succ + 10mg  ivab 2 hr prior to scan Clarise Cruz

## 2020-07-01 ENCOUNTER — Ambulatory Visit: Payer: Medicare Other | Admitting: Cardiology

## 2020-07-01 ENCOUNTER — Other Ambulatory Visit: Payer: Self-pay

## 2020-07-01 ENCOUNTER — Ambulatory Visit (HOSPITAL_COMMUNITY)
Admission: RE | Admit: 2020-07-01 | Discharge: 2020-07-01 | Disposition: A | Discharge status: Home / Self Care | Payer: Medicare Other | Source: Ambulatory Visit | Attending: Pulmonary Disease | Admitting: Pulmonary Disease

## 2020-07-01 DIAGNOSIS — I259 Chronic ischemic heart disease, unspecified: Secondary | ICD-10-CM | POA: Insufficient documentation

## 2020-07-01 DIAGNOSIS — J986 Disorders of diaphragm: Secondary | ICD-10-CM | POA: Insufficient documentation

## 2020-07-01 DIAGNOSIS — I209 Angina pectoris, unspecified: Secondary | ICD-10-CM | POA: Diagnosis not present

## 2020-07-02 ENCOUNTER — Other Ambulatory Visit (HOSPITAL_COMMUNITY): Payer: Self-pay | Admitting: Emergency Medicine

## 2020-07-02 ENCOUNTER — Other Ambulatory Visit (HOSPITAL_COMMUNITY): Payer: Self-pay | Admitting: Cardiovascular Disease

## 2020-07-02 ENCOUNTER — Ambulatory Visit (HOSPITAL_COMMUNITY)
Admission: RE | Admit: 2020-07-02 | Discharge: 2020-07-02 | Disposition: A | Discharge status: Home / Self Care | Payer: Medicare Other | Source: Ambulatory Visit | Attending: Cardiology | Admitting: Cardiology

## 2020-07-02 DIAGNOSIS — J986 Disorders of diaphragm: Secondary | ICD-10-CM | POA: Diagnosis not present

## 2020-07-02 DIAGNOSIS — I259 Chronic ischemic heart disease, unspecified: Secondary | ICD-10-CM | POA: Diagnosis not present

## 2020-07-02 DIAGNOSIS — I209 Angina pectoris, unspecified: Secondary | ICD-10-CM

## 2020-07-02 MED ORDER — METOPROLOL TARTRATE 5 MG/5ML IV SOLN
INTRAVENOUS | Status: AC
  Filled 2020-07-02: qty 15

## 2020-07-02 MED ORDER — NITROGLYCERIN 0.4 MG SL SUBL
0.8000 mg | SUBLINGUAL_TABLET | Freq: Once | SUBLINGUAL | Status: AC
  Administered 2020-07-02: 0.8 mg via SUBLINGUAL

## 2020-07-02 MED ORDER — NITROGLYCERIN 0.4 MG SL SUBL
SUBLINGUAL_TABLET | SUBLINGUAL | Status: AC
  Filled 2020-07-02: qty 2

## 2020-07-02 MED ORDER — DILTIAZEM HCL 25 MG/5ML IV SOLN
10.0000 mg | Freq: Once | INTRAVENOUS | Status: AC
  Administered 2020-07-02: 10 mg via INTRAVENOUS
  Filled 2020-07-02: qty 5

## 2020-07-02 MED ORDER — METOPROLOL TARTRATE 5 MG/5ML IV SOLN
5.0000 mg | INTRAVENOUS | Status: DC | PRN
  Administered 2020-07-02: 5 mg via INTRAVENOUS

## 2020-07-02 MED ORDER — DILTIAZEM HCL 25 MG/5ML IV SOLN
INTRAVENOUS | Status: AC
  Filled 2020-07-02: qty 5

## 2020-07-02 MED ORDER — IOHEXOL 350 MG/ML SOLN
100.0000 mL | Freq: Once | INTRAVENOUS | Status: AC | PRN
  Administered 2020-07-02: 95 mL via INTRAVENOUS

## 2020-07-02 MED ORDER — SODIUM CHLORIDE 0.9 % IV BOLUS
250.0000 mL | Freq: Once | INTRAVENOUS | Status: AC
  Administered 2020-07-02: 250 mL via INTRAVENOUS

## 2020-07-02 NOTE — Progress Notes (Signed)
CT scan completed. Tolerated well. D/C home in wheelchair. Awake and alert. In no distress. 

## 2020-07-03 NOTE — Progress Notes (Signed)
Called and left message on voicemail to please return phone call to go over Sniff test result. Contact number provided.

## 2020-07-12 ENCOUNTER — Ambulatory Visit: Payer: Medicare Other | Admitting: Pulmonary Disease

## 2020-07-16 ENCOUNTER — Other Ambulatory Visit: Payer: Self-pay

## 2020-07-22 ENCOUNTER — Ambulatory Visit: Payer: Medicare Other | Admitting: Cardiology

## 2020-07-23 ENCOUNTER — Other Ambulatory Visit: Payer: Self-pay

## 2020-07-23 ENCOUNTER — Encounter: Payer: Self-pay | Admitting: Cardiology

## 2020-07-23 ENCOUNTER — Ambulatory Visit (INDEPENDENT_AMBULATORY_CARE_PROVIDER_SITE_OTHER): Payer: Medicare Other | Admitting: Cardiology

## 2020-07-23 VITALS — BP 126/72 | HR 90 | Ht 65.0 in | Wt 212.0 lb

## 2020-07-23 DIAGNOSIS — E782 Mixed hyperlipidemia: Secondary | ICD-10-CM

## 2020-07-23 DIAGNOSIS — I2510 Atherosclerotic heart disease of native coronary artery without angina pectoris: Secondary | ICD-10-CM | POA: Diagnosis not present

## 2020-07-23 DIAGNOSIS — I471 Supraventricular tachycardia: Secondary | ICD-10-CM | POA: Diagnosis not present

## 2020-07-23 DIAGNOSIS — E088 Diabetes mellitus due to underlying condition with unspecified complications: Secondary | ICD-10-CM | POA: Diagnosis not present

## 2020-07-23 DIAGNOSIS — I2584 Coronary atherosclerosis due to calcified coronary lesion: Secondary | ICD-10-CM | POA: Diagnosis not present

## 2020-07-23 DIAGNOSIS — I10 Essential (primary) hypertension: Secondary | ICD-10-CM | POA: Diagnosis not present

## 2020-07-23 LAB — TSH: TSH: 4.39 u[IU]/mL (ref 0.450–4.500)

## 2020-07-23 LAB — HEPATIC FUNCTION PANEL
ALT: 23 IU/L (ref 0–32)
AST: 25 IU/L (ref 0–40)
Albumin: 4.1 g/dL (ref 3.7–4.7)
Alkaline Phosphatase: 84 IU/L (ref 44–121)
Bilirubin Total: 0.3 mg/dL (ref 0.0–1.2)
Bilirubin, Direct: <0.1 mg/dL (ref 0.00–0.40)
Total Protein: 6.6 g/dL (ref 6.0–8.5)

## 2020-07-23 LAB — BASIC METABOLIC PANEL
BUN/Creatinine Ratio: 20 (ref 12–28)
BUN: 22 mg/dL (ref 8–27)
CO2: 26 mmol/L (ref 20–29)
Calcium: 9.7 mg/dL (ref 8.7–10.3)
Chloride: 102 mmol/L (ref 96–106)
Creatinine, Ser: 1.09 mg/dL — ABNORMAL HIGH (ref 0.57–1.00)
Glucose: 118 mg/dL — ABNORMAL HIGH (ref 65–99)
Potassium: 4.6 mmol/L (ref 3.5–5.2)
Sodium: 142 mmol/L (ref 134–144)
eGFR: 54 mL/min/{1.73_m2} — ABNORMAL LOW (ref >59)

## 2020-07-23 NOTE — Patient Instructions (Signed)
Medication Instructions:  Your physician has recommended you make the following change in your medication:   We will start rosuvastatin 10 mg once your labs are resulted.  *If you need a refill on your cardiac medications before your next appointment, please call your pharmacy*   Lab Work: Your physician recommends that you have labs done in the office today. Your test included  basic metabolic panel, TSH and liver function.  Your physician recommends that you return for lab work in: 6 weeks approximately (09/03/20). You need to have labs done when you are fasting.  You can come Monday through Friday 8:30 am to 12:00 pm and 1:15 to 4:30. You do not need to make an appointment as the order has already been placed. The labs you are going to have done are BMET, LFT and Lipids.  If you have labs (blood work) drawn today and your tests are completely normal, you will receive your results only by: Heather Chavez MyChart Message (if you have MyChart) OR . A paper copy in the mail If you have any lab test that is abnormal or we need to change your treatment, we will call you to review the results.   Testing/Procedures: None ordered   Follow-Up: At [facility], you and your health needs are our priority.  As part of our continuing mission to provide you with exceptional heart care, we have created designated Provider Care Teams.  These Care Teams include your primary Cardiologist (physician) and Advanced Practice Providers (APPs -  Physician Assistants and Nurse Practitioners) who all work together to provide you with the care you need, when you need it.  We recommend signing up for the patient portal called "MyChart".  Sign up information is provided on this After Visit Summary.  MyChart is used to connect with patients for Virtual Visits (Telemedicine).  Patients are able to view lab/test results, encounter notes, upcoming appointments, etc.  Non-urgent messages can be sent to your provider as well.   To  learn more about what you can do with MyChart, go to [email or web address].    Your next appointment:   4 month(s)  The format for your next appointment:   In Person  Provider:   Jyl Heinz, MD   Other Instructions Rosuvastatin Capsules What is this medicine? ROSUVASTATIN (roe SOO va sta tin) is known as an HMG-CoA reductase inhibitor or 'statin'. It lowers cholesterol and triglycerides in the blood. Diet and lifestyle changes are often used with this drug. This medicine may be used for other purposes; ask your health care provider or pharmacist if you have questions. COMMON BRAND NAME(S): Ezallor What should I tell my health care provider before I take this medicine? They need to know if you have any of these conditions:  diabetes  if you often drink alcohol  history of stroke  kidney disease  liver disease  muscle aches or weakness  thyroid disease  an unusual or allergic reaction to rosuvastatin, other medicines, foods, dyes, or preservatives  pregnant or trying to get pregnant  breast-feeding How should I use this medicine? Take this medicine by mouth with a glass of water. Follow the directions on the prescription label. Swallow the capsules whole. Do not crush or chew this medicine. You may open the capsule and put the contents in 1 teaspoon of applesauce, chocolate pudding, or vanilla pudding. Swallow the medicine with applesauce or pudding within 60 minutes (1 hour). Do not chew the medicine, applesauce, or pudding. You can take this medicine with  or without food. Take your doses at regular intervals. Do not take your medicine more often than directed. Talk to your pediatrician about the use of this medicine in children. Special care may be needed. Overdosage: If you think you have taken too much of this medicine contact a poison control center or emergency room at once. NOTE: This medicine is only for you. Do not share this medicine with others. What if I  miss a dose? If you miss a dose, take it as soon as you can. If your next dose is to be taken in less than 12 hours, then do not take the missed dose. Take the next dose at your regular time. Do not take double or extra doses. What may interact with this medicine? Do not take this medicine with any of the following medications:  herbal medicines like red yeast rice This medicine may also interact with the following medications:  alcohol  antacids containing aluminum hydroxide or magnesium hydroxide  cyclosporine  other medicines for high cholesterol  some medicines for HIV infection  warfarin This list may not describe all possible interactions. Give your health care provider a list of all the medicines, herbs, non-prescription drugs, or dietary supplements you use. Also tell them if you smoke, drink alcohol, or use illegal drugs. Some items may interact with your medicine. What should I watch for while using this medicine? Visit your doctor or health care professional for regular check-ups. You may need regular tests to make sure your liver is working properly. Your health care professional may tell you to stop taking this medicine if you develop muscle problems. If your muscle problems do not go away after stopping this medicine, contact your health care professional. Do not become pregnant while taking this medicine. Women should inform their health care professional if they wish to become pregnant or think they might be pregnant. There is a potential for serious side effects to an unborn child. Talk to your health care professional or pharmacist for more information. Do not breast-feed an infant while taking this medicine. This medicine may increase blood sugar. Ask your healthcare provider if changes in diet or medicines are needed if you have diabetes. If you are going to need surgery or other procedure, tell your doctor that you are using this medicine. This drug is only part of a  total heart-health program. Your doctor or a dietician can suggest a low-cholesterol and low-fat diet to help. Avoid alcohol and smoking, and keep a proper exercise schedule. This medicine may cause a decrease in Co-Enzyme Q-10. You should make sure that you get enough Co-Enzyme Q-10 while you are taking this medicine. Discuss the foods you eat and the vitamins you take with your health care professional. What side effects may I notice from receiving this medicine? Side effects that you should report to your doctor or health care professional as soon as possible:  allergic reactions like skin rash, itching or hives, swelling of the face, lips, or tongue  dark urine  fever  joint pain  muscle cramps, pain  redness, blistering, peeling or loosening of the skin, including inside the mouth  signs and symptoms of high blood sugar such as being more thirsty or hungry or having to urinate more than normal. You may also feel very tired or have blurry vision.  trouble passing urine or change in the amount of urine  unusually weak  yellowing of the eyes or skin Side effects that usually do not require medical  attention (report these to your doctor or health care professional if they continue or are bothersome):  constipation  heartburn  nausea  stomach gas, pain, upset This list may not describe all possible side effects. Call your doctor for medical advice about side effects. You may report side effects to FDA at 1-800-FDA-1088. Where should I keep my medicine? Keep out of the reach of children. Store at room temperature between 20 and 25 degrees C (68 and 77 degrees F). Keep container tightly closed (protect from moisture). Throw away any unused medicine after the expiration date. NOTE: This sheet is a summary. It may not cover all possible information. If you have questions about this medicine, talk to your doctor, pharmacist, or health care provider.  2021 Elsevier/Gold Standard  (2020-01-07 09:54:10)

## 2020-07-23 NOTE — Progress Notes (Signed)
Cardiology Office Note:    Date:  07/23/2020   ID:  Heather Chavez, DOB [birth year], MRN [id number]  PCP:  Ronita Hipps, MD  Cardiologist:  Jenean Lindau, MD   Referring MD: Ronita Hipps, MD    ASSESSMENT:    1. Coronary artery disease involving native coronary artery of native heart without angina pectoris   2. Coronary artery calcification   3. Essential hypertension   4. SVT (supraventricular tachycardia) ([place])   5. Diabetes mellitus due to underlying condition with unspecified complications ([place])   6. Mixed dyslipidemia    PLAN:    In order of problems listed above:  1. Coronary artery disease: Nonobstructive and mild nature.  Secondary prevention stressed with the patient.  Importance of compliance with diet medication stressed and she vocalized understanding. 2. Essential hypertension: Blood pressure stable and diet was emphasized.  Lifestyle modification was urged 3. Supraventricular tachycardia: On flecainide and doing well.  EKG today is unremarkable with QRS within normal limits. 4. Diabetes mellitus dyslipidemia and obesity: I discussed this with her at length risks of obesity explained.  Lifestyle modification was urged her hemoglobin A1c from KPN sheath was elevated and I cautioned her against this and she promises to do better. 5. Mixed dyslipidemia: Patient is a diabetic with coronary artery disease.  We will do a Chem-7 TSH and liver panel today and if it is fine will initiate her on rosuvastatin 10 mg every day.  Her primary care also has discussed this with her but she has not initiated it.  She will be back in 6 weeks for follow-up blood work if you begin the rosuvastatin. 6. Patient will be seen in follow-up appointment in 4 months or earlier if the patient has any concerns    Medication Adjustments/Labs and Tests Ordered: Current medicines are reviewed at length with the patient today.  Concerns regarding medicines are outlined above.  No orders of  the defined types were placed in this encounter.  No orders of the defined types were placed in this encounter.    No chief complaint on file.    History of Present Illness:    Heather Chavez is a 75 y.o. female.  Patient has past medical history of mild nonobstructive coronary artery disease, supraventricular tachycardia, essential hypertension dyslipidemia and diabetes mellitus.  She denies any problems at this time and takes care of activities of daily living.  No chest pain orthopnea or PND.  At the time of my evaluation, the patient is alert awake oriented and in no distress.  Past Medical History:  Diagnosis Date  . Advanced nonexudative age-related macular degeneration of right eye with subfoveal involvement 09/21/2019  . Allergic rhinitis   . Angina pectoris ([place]) 05/17/2020  . Bilateral breast cancer ([place]) 11/23/2013  . BMI 34.0-34.9,adult 09/25/2019  . Breast cancer ([place])   . Bronchitis, chronic ([place])   . CAD (coronary artery disease)   . Cancer ([place])   . Cardiac murmur 05/17/2020  . Coronary artery calcification 05/17/2020  . Degenerative retinal drusen of right eye 09/21/2019  . Diabetes mellitus due to underlying condition with unspecified complications ([place]) 05/30/5571  . Diverticulosis   . Dry eyes 06/25/2016  . Dyspnea   . Emphysema/COPD ([place])   . Essential hypertension 05/17/2020  . Estrogen receptor positive neoplasm 09/24/2015  . Family history of malignant neoplasm of breast 11/23/2013  . Family history of peritoneal cancer 11/23/2013  . Fuchs' corneal dystrophy 11/03/2016  . GERD (gastroesophageal reflux disease)   .  History of colon polyps   . History of right breast cancer 09/19/2018  . Hypertension   . IBS (irritable bowel syndrome)    with D  . Intermediate stage nonexudative age-related macular degeneration of left eye 09/21/2019  . Iritis 11/03/2016  . Macular degeneration   . Major depressive disorder   . Malignant neoplasm of upper-inner quadrant of  left breast in female, estrogen receptor positive ([place]) 09/01/2016  . Mixed dyslipidemia 05/17/2020  . Obesity   . Obesity (BMI 35.0-39.9 without comorbidity) 05/17/2020  . Personal history of chemotherapy   . Personal history of radiation therapy   . Pseudophakia 09/21/2019  . Right-sided chest wall pain   . SVT (supraventricular tachycardia) ([place]) 05/23/2020  . Varicose veins of left lower extremity with complications 03/14/1094    Past Surgical History:  Procedure Laterality Date  . BREAST BIOPSY    . BREAST LUMPECTOMY     right brwast 2005/ left 2015  . CATARACT EXTRACTION    . CHOLECYSTECTOMY    . COLONOSCOPY  04/17/2015   Colonic polyps status post polypectomy. Moderate to severe sigmoid diverticulosis  . TUBAL LIGATION    . VAGINAL HYSTERECTOMY    . VEIN LIGATION AND STRIPPING      Current Medications: Current Meds  Medication Sig  . albuterol (VENTOLIN HFA) 108 (90 Base) MCG/ACT inhaler Inhale 2 puffs into the lungs every 4 (four) hours as needed for wheezing.  . Biotin 10000 MCG TABS Take 10,000 mcg by mouth daily.  Marland Kitchen doxycycline (VIBRAMYCIN) 100 MG capsule Take 100 mg by mouth 2 (two) times daily.  . DULoxetine (CYMBALTA) 60 MG capsule Take 60 mg by mouth daily.  . flecainide (TAMBOCOR) 50 MG tablet Take 1 tablet (50 mg total) by mouth 2 (two) times daily.  . furosemide (LASIX) 20 MG tablet Take 20 mg by mouth as needed for fluid or edema.  . gabapentin (NEURONTIN) 300 MG capsule Take 1 capsule by mouth at bedtime.  Marland Kitchen losartan-hydrochlorothiazide (HYZAAR) 100-25 MG tablet Take 0.05 tablets by mouth daily.  . meloxicam (MOBIC) 15 MG tablet Take 15 mg by mouth daily.  . metFORMIN (GLUCOPHAGE) 500 MG tablet Take 500 mg by mouth daily.  . metoprolol succinate (TOPROL XL) 25 MG 24 hr tablet Take 1 tablet (25 mg total) by mouth 2 (two) times daily.  . montelukast (SINGULAIR) 10 MG tablet Take 10 mg by mouth at bedtime.  . Multiple Vitamins-Minerals (OCUVITE-LUTEIN PO) Take 1  tablet by mouth daily.  . nitroGLYCERIN (NITROSTAT) 0.4 MG SL tablet Place 0.4 mg under the tongue every 5 (five) minutes as needed for chest pain.  Marland Kitchen omega-3 fish oil (MAXEPA) 1000 MG CAPS capsule Take 1,000 mg by mouth daily.  . pantoprazole (PROTONIX) 40 MG tablet Take 1 tablet (40 mg total) by mouth daily.  . potassium chloride (KLOR-CON) 10 MEQ tablet Take 10 mEq by mouth daily.  . vitamin E 180 MG (400 UNITS) capsule Take 400 Units by mouth daily.  Marland Kitchen zinc gluconate 50 MG tablet Take 50 mg by mouth daily.     Allergies:   Codeine and Acetaminophen   Social History   Socioeconomic History  . Marital status: Married    Spouse name: Not on file  . Number of children: Not on file  . Years of education: Not on file  . Highest education level: Not on file  Occupational History  . Not on file  Tobacco Use  . Smoking status: Former Smoker    Packs/day: 2.50  Years: 15.00    Pack years: 37.50    Types: Cigarettes    Quit date: 03/09/1981    Years since quitting: 39.4  . Smokeless tobacco: Never Used  Vaping Use  . Vaping Use: Never used  Substance and Sexual Activity  . Alcohol use: Yes  . Drug use: No  . Sexual activity: Not on file  Other Topics Concern  . Not on file  Social History Narrative  . Not on file   Social Determinants of Health   Financial Resource Strain: Not on file  Food Insecurity: Not on file  Transportation Needs: Not on file  Physical Activity: Not on file  Stress: Not on file  Social Connections: Not on file     Family History: The patient's family history includes Breast cancer in her mother and sister; Cancer in her paternal grandmother; Cancer (age of onset: 73) in her sister; Cancer (age of onset: 60) in her mother and paternal aunt; Cancer (age of onset: 51) in her father; Other in her mother.  ROS:   Please see the history of present illness.    All other systems reviewed and are negative.  EKGs/Labs/Other Studies Reviewed:    The  following studies were reviewed today: IMPRESSION: 1. Coronary calcium score of 84. This was 62nd percentile for age-, race-, and sex-matched controls.  2. Normal coronary origin with left dominance.  3.  Minimal plaque in the LAD and LCX.  4.  No obstructive coronary artery disease.  Skeet Latch, MD   Electronically Signed   By: Skeet Latch   On: 07/02/2020 14:22   Recent Labs: 06/17/2020: BUN 23; Creatinine, Ser 0.96; Potassium 4.5; Sodium 141  Recent Lipid Panel No results found for: CHOL, TRIG, HDL, CHOLHDL, VLDL, LDLCALC, LDLDIRECT  Physical Exam:    VS:  BP 126/72   Pulse 90   Ht 5\' 5"  (1.651 m)   Wt 212 lb (96.2 kg)   SpO2 95%   BMI 35.28 kg/m     Wt Readings from Last 3 Encounters:  07/23/20 212 lb (96.2 kg)  06/17/20 209 lb (94.8 kg)  05/31/20 210 lb (95.3 kg)     GEN: Patient is in no acute distress HEENT: Normal NECK: No JVD; No carotid bruits LYMPHATICS: No lymphadenopathy CARDIAC: Hear sounds regular, 2/6 systolic murmur at the apex. RESPIRATORY:  Clear to auscultation without rales, wheezing or rhonchi  ABDOMEN: Soft, non-tender, non-distended MUSCULOSKELETAL:  No edema; No deformity  SKIN: Warm and dry NEUROLOGIC:  Alert and oriented x 3 PSYCHIATRIC:  Normal affect   Signed, Jenean Lindau, MD  07/23/2020 10:39 AM    [facility]

## 2020-07-24 MED ORDER — ROSUVASTATIN CALCIUM 10 MG PO TABS: 10.0000 mg | ORAL_TABLET | Freq: Every day | ORAL | 3 refills | Status: DC

## 2020-07-24 NOTE — Addendum Note (Signed)
Addended by: Truddie Hidden on: 07/24/2020 02:16 PM   Modules accepted: Orders

## 2020-07-30 ENCOUNTER — Telehealth: Payer: Self-pay | Admitting: Pulmonary Disease

## 2020-07-30 ENCOUNTER — Ambulatory Visit (INDEPENDENT_AMBULATORY_CARE_PROVIDER_SITE_OTHER): Payer: Medicare Other | Admitting: Pulmonary Disease

## 2020-07-30 ENCOUNTER — Other Ambulatory Visit: Payer: Self-pay

## 2020-07-30 ENCOUNTER — Encounter: Payer: Self-pay | Admitting: Pulmonary Disease

## 2020-07-30 VITALS — BP 122/68 | HR 80 | Temp 97.5°F | Ht 65.0 in | Wt 212.2 lb

## 2020-07-30 DIAGNOSIS — I209 Angina pectoris, unspecified: Secondary | ICD-10-CM | POA: Diagnosis not present

## 2020-07-30 DIAGNOSIS — J4540 Moderate persistent asthma, uncomplicated: Secondary | ICD-10-CM

## 2020-07-30 DIAGNOSIS — J209 Acute bronchitis, unspecified: Secondary | ICD-10-CM

## 2020-07-30 DIAGNOSIS — J432 Centrilobular emphysema: Secondary | ICD-10-CM

## 2020-07-30 DIAGNOSIS — J301 Allergic rhinitis due to pollen: Secondary | ICD-10-CM

## 2020-07-30 DIAGNOSIS — G4733 Obstructive sleep apnea (adult) (pediatric): Secondary | ICD-10-CM | POA: Diagnosis not present

## 2020-07-30 MED ORDER — AMOXICILLIN-POT CLAVULANATE 875-125 MG PO TABS: 1.0000 | ORAL_TABLET | Freq: Two times a day (BID) | ORAL | 0 refills | Status: DC

## 2020-07-30 MED ORDER — SPIRIVA RESPIMAT 2.5 MCG/ACT IN AERS: 2.0000 | INHALATION_SPRAY | Freq: Every day | RESPIRATORY_TRACT | 5 refills | Status: DC

## 2020-07-30 NOTE — Telephone Encounter (Signed)
Called and spoke with pt and she stated that the spiriva was too expensive but was told that her insurance will cover the incruse.  VS please advise if ok to send in this medication in place of the spiriva.  Thanks

## 2020-07-30 NOTE — Progress Notes (Signed)
Heather Chavez, Heather Chavez, Heather Chavez  Chief Complaint  Patient presents with  . Follow-up    Had PFT 06-15-2020. C/o sob worse x 1 wk., cough-green, wheezing, no fcs    Constitutional:  BP 122/68 (BP Location: Right Arm, Cuff Size: Large)   Pulse 80   Temp (!) 97.5 F (36.4 C) (Temporal)   Ht 5\' 5"  (1.651 m)   Wt 212 lb 3.2 oz (96.3 kg)   SpO2 96%   BMI 35.31 kg/m   Past Medical History:  Breast cancer, Depression, GERD, HTN  Past Surgical History:  She  has a past surgical history that includes Breast lumpectomy; Breast biopsy; Cataract extraction; Vein ligation Heather stripping; Tubal ligation; Vaginal hysterectomy; Cholecystectomy; Heather Colonoscopy (04/17/2015).  Brief Summary:  Heather Chavez is a 75 y.o. female former smoker with emphysema, asthma, Heather obstructive sleep apnea.      Subjective:   Since her last visit she had several tests.  PFT showed mild restriction Heather diffusion defect.  CT chest showed elevated Rt diaphragm Heather emphysema.  Sniff test showed normal Rt diaphragm movement.  Home sleep study showed moderate sleep apnea.  She developed sinus congestion, post nasal drip, chest congestion, Heather cough with green sputum.  She has been feeling more short of breath during this episode which has been going on for the past several days.  Physical Exam:   Appearance - well kempt   ENMT - no sinus tenderness, no oral exudate, no LAN, Mallampati 3airway, no stridor  Respiratory - scattered rhonchi Heather faint wheezing that clears with coughing  CV - s1s2 regular rate Heather rhythm, no murmurs  Ext - no clubbing, no edema  Skin - no rashes  Psych - normal mood Heather affect    Chavez testing:   PFT 06/20/20 >> FEV1 1.72 (75%), FEV1% 84, TLC 4.05 (77%), DLCO 62%  Chest Imaging:   CT chest 06/01/18 >> coronary calcification, emphysema, scar with volume loss RML Heather RLL  CT chest 05/20/20 >> scarring in RML Heather lingula  Sniff test  07/01/20 >> elevated Rt diaphragm, but normal movement  Sleep Tests:   HST 06/21/20 >> AHI 21.4, SpO2 71%  Cardiac Tests:   Echo 05/21/20 >> EF greater than 70%  Social History:  She  reports that she quit smoking about 39 years ago. Her smoking use included cigarettes. She has a 37.50 pack-year smoking history. She has never used smokeless tobacco. She reports current alcohol use. She reports that she does not use drugs.  Family History:  Her family history includes Breast cancer in her mother Heather sister; Cancer in her paternal grandmother; Cancer (age of onset: 1) in her sister; Cancer (age of onset: 2) in her mother Heather paternal aunt; Cancer (age of onset: 77) in her father; Other in her mother.     Assessment/Plan:   Acute bronchitis. - give course of augmentin  Chronic cough from emphysema, asthma, Heather allergic rhinitis. - she wasn't able to tolerate symbicort before due to palpitations  - trelegy caused throat irritation - will have her try spiriva respimat - continue singulair - prn albuterol - prn nasal irrigation Heather flonase  Obstructive sleep apnea. - reviewed her sleep study results - discussed how untreated sleep apnea can impact her health - reviewed treatment options - will arrange for auto CPAP 5 to 15 cm H2O  Obesity. - discussed importance of weight loss, particularly in relation to sleep apnea  Supraventricular tachycardia. - followed by Dr. Geraldo Pitter  with Cardiology  Time Spent Involved in Patient Chavez on Day of Examination:  34 minutes  Follow up:  Patient Instructions  Augmentin one pill twice daily for 7 days  Spiriva two puffs daily  Will arrange for auto CPAP set up  Follow up in 4 months   Medication List:   Allergies as of 07/30/2020      Reactions   Codeine Other (See Comments), Nausea Heather Vomiting   Other reaction(s): Other (see comments), Vomiting   Acetaminophen Other (See Comments)   Oher reaction(s): Eye Redness, Other (see  comments)      Medication List       Accurate as of Jul 30, 2020  2:04 PM. If you have any questions, ask your nurse or doctor.        STOP taking these medications   doxycycline 100 MG capsule Commonly known as: VIBRAMYCIN Stopped by: Chesley Mires, MD     TAKE these medications   albuterol 108 (90 Base) MCG/ACT inhaler Commonly known as: VENTOLIN HFA Inhale 2 puffs into the lungs every 4 (four) hours as needed for wheezing.   amoxicillin-clavulanate 875-125 MG tablet Commonly known as: AUGMENTIN Take 1 tablet by mouth 2 (two) times daily. Started by: Chesley Mires, MD   Biotin 10000 MCG Tabs Take 10,000 mcg by mouth daily.   DULoxetine 60 MG capsule Commonly known as: CYMBALTA Take 60 mg by mouth daily.   flecainide 50 MG tablet Commonly known as: TAMBOCOR Take 1 tablet (50 mg total) by mouth 2 (two) times daily.   furosemide 20 MG tablet Commonly known as: LASIX Take 20 mg by mouth as needed for fluid or edema.   gabapentin 300 MG capsule Commonly known as: NEURONTIN Take 1 capsule by mouth at bedtime.   losartan-hydrochlorothiazide 100-25 MG tablet Commonly known as: HYZAAR Take 0.05 tablets by mouth daily.   meloxicam 15 MG tablet Commonly known as: MOBIC Take 15 mg by mouth daily.   metFORMIN 500 MG tablet Commonly known as: GLUCOPHAGE Take 500 mg by mouth daily.   metoprolol succinate 25 MG 24 hr tablet Commonly known as: Toprol XL Take 1 tablet (25 mg total) by mouth 2 (two) times daily.   montelukast 10 MG tablet Commonly known as: SINGULAIR Take 10 mg by mouth at bedtime.   nitroGLYCERIN 0.4 MG SL tablet Commonly known as: NITROSTAT Place 0.4 mg under the tongue every 5 (five) minutes as needed for chest pain.   OCUVITE-LUTEIN PO Take 1 tablet by mouth daily.   omega-3 fish oil 1000 MG Caps capsule Commonly known as: MAXEPA Take 1,000 mg by mouth daily.   pantoprazole 40 MG tablet Commonly known as: PROTONIX Take 1 tablet (40 mg  total) by mouth daily.   potassium chloride 10 MEQ tablet Commonly known as: KLOR-CON Take 10 mEq by mouth daily.   rosuvastatin 10 MG tablet Commonly known as: CRESTOR Take 1 tablet (10 mg total) by mouth daily.   Spiriva Respimat 2.5 MCG/ACT Aers Generic drug: Tiotropium Bromide Monohydrate Inhale 2 puffs into the lungs daily. Started by: Chesley Mires, MD   vitamin E 180 MG (400 UNITS) capsule Take 400 Units by mouth daily.   zinc gluconate 50 MG tablet Take 50 mg by mouth daily.       Signature:  Chesley Mires, MD [place] Pager - [phone] 07/30/2020, 2:04 PM

## 2020-07-30 NOTE — Patient Instructions (Signed)
Augmentin one pill twice daily for 7 days  Spiriva two puffs daily  Will arrange for auto CPAP set up  Follow up in 4 months

## 2020-07-31 MED ORDER — INCRUSE ELLIPTA 62.5 MCG/INH IN AEPB: 1.0000 | INHALATION_SPRAY | Freq: Every day | RESPIRATORY_TRACT | 5 refills | Status: DC

## 2020-07-31 NOTE — Telephone Encounter (Signed)
Incruse prescription sent to requested pharmacy.  Left detailed message on VM (DPR), about prescription being sent to pharmacy, and to call with any questions. Incruse directions given.

## 2020-07-31 NOTE — Telephone Encounter (Signed)
Okay to send script for incruse one puff daily.

## 2020-08-19 DIAGNOSIS — R509 Fever, unspecified: Secondary | ICD-10-CM | POA: Diagnosis not present

## 2020-08-26 DIAGNOSIS — J45909 Unspecified asthma, uncomplicated: Secondary | ICD-10-CM | POA: Diagnosis not present

## 2020-08-28 ENCOUNTER — Other Ambulatory Visit: Payer: Self-pay

## 2020-08-28 MED ORDER — PANTOPRAZOLE SODIUM 40 MG PO TBEC: 40.0000 mg | DELAYED_RELEASE_TABLET | Freq: Every day | ORAL | 1 refills | Status: DC

## 2020-08-28 NOTE — Progress Notes (Signed)
Pharmacy faxed over paper saying 90 day supply is better for patient

## 2020-09-13 DIAGNOSIS — E782 Mixed hyperlipidemia: Secondary | ICD-10-CM | POA: Diagnosis not present

## 2020-09-14 LAB — LIPID PANEL
Chol/HDL Ratio: 4.3 ratio (ref 0.0–4.4)
Cholesterol, Total: 228 mg/dL — ABNORMAL HIGH (ref 100–199)
HDL: 53 mg/dL (ref >39)
LDL Chol Calc (NIH): 153 mg/dL — ABNORMAL HIGH (ref 0–99)
Triglycerides: 124 mg/dL (ref 0–149)
VLDL Cholesterol Cal: 22 mg/dL (ref 5–40)

## 2020-09-14 LAB — BASIC METABOLIC PANEL
BUN/Creatinine Ratio: 20 (ref 12–28)
BUN: 20 mg/dL (ref 8–27)
CO2: 24 mmol/L (ref 20–29)
Calcium: 9.3 mg/dL (ref 8.7–10.3)
Chloride: 102 mmol/L (ref 96–106)
Creatinine, Ser: 1 mg/dL (ref 0.57–1.00)
Glucose: 161 mg/dL — ABNORMAL HIGH (ref 65–99)
Potassium: 4.2 mmol/L (ref 3.5–5.2)
Sodium: 138 mmol/L (ref 134–144)
eGFR: 59 mL/min/{1.73_m2} — ABNORMAL LOW (ref >59)

## 2020-09-14 LAB — HEPATIC FUNCTION PANEL
ALT: 24 IU/L (ref 0–32)
AST: 25 IU/L (ref 0–40)
Albumin: 4.2 g/dL (ref 3.7–4.7)
Alkaline Phosphatase: 86 IU/L (ref 44–121)
Bilirubin Total: 0.3 mg/dL (ref 0.0–1.2)
Bilirubin, Direct: <0.1 mg/dL (ref 0.00–0.40)
Total Protein: 6.5 g/dL (ref 6.0–8.5)

## 2020-09-18 ENCOUNTER — Telehealth: Payer: Self-pay

## 2020-09-18 DIAGNOSIS — E782 Mixed hyperlipidemia: Secondary | ICD-10-CM

## 2020-09-18 NOTE — Telephone Encounter (Signed)
Results reviewed with pt as per Dr. Revankar's note.  Pt verbalized understanding and had no additional questions. Routed to PCP.  

## 2020-09-20 ENCOUNTER — Ambulatory Visit
Admission: RE | Admit: 2020-09-20 | Discharge: 2020-09-20 | Disposition: A | Discharge status: Home / Self Care | Payer: Medicare Other | Source: Ambulatory Visit | Attending: Vascular Surgery | Admitting: Vascular Surgery

## 2020-09-20 ENCOUNTER — Other Ambulatory Visit: Payer: Self-pay

## 2020-09-20 DIAGNOSIS — Z1231 Encounter for screening mammogram for malignant neoplasm of breast: Secondary | ICD-10-CM | POA: Diagnosis not present

## 2020-09-23 ENCOUNTER — Encounter (INDEPENDENT_AMBULATORY_CARE_PROVIDER_SITE_OTHER): Payer: Medicare Other | Admitting: Ophthalmology

## 2020-09-26 DIAGNOSIS — Z20828 Contact with and (suspected) exposure to other viral communicable diseases: Secondary | ICD-10-CM | POA: Diagnosis not present

## 2020-09-26 DIAGNOSIS — G629 Polyneuropathy, unspecified: Secondary | ICD-10-CM | POA: Diagnosis not present

## 2020-09-26 DIAGNOSIS — U071 COVID-19: Secondary | ICD-10-CM | POA: Diagnosis not present

## 2020-09-26 DIAGNOSIS — R509 Fever, unspecified: Secondary | ICD-10-CM | POA: Diagnosis not present

## 2020-10-07 ENCOUNTER — Encounter: Payer: Self-pay | Admitting: Gastroenterology

## 2020-10-15 DIAGNOSIS — C50212 Malignant neoplasm of upper-inner quadrant of left female breast: Secondary | ICD-10-CM | POA: Diagnosis not present

## 2020-10-15 DIAGNOSIS — Z170 Estrogen receptor positive status [ER+]: Secondary | ICD-10-CM | POA: Diagnosis not present

## 2020-10-15 DIAGNOSIS — Z853 Personal history of malignant neoplasm of breast: Secondary | ICD-10-CM | POA: Diagnosis not present

## 2020-10-29 ENCOUNTER — Other Ambulatory Visit: Payer: Self-pay

## 2020-10-29 ENCOUNTER — Ambulatory Visit (INDEPENDENT_AMBULATORY_CARE_PROVIDER_SITE_OTHER): Payer: Medicare Other | Admitting: Ophthalmology

## 2020-10-29 ENCOUNTER — Encounter (INDEPENDENT_AMBULATORY_CARE_PROVIDER_SITE_OTHER): Payer: Self-pay | Admitting: Ophthalmology

## 2020-10-29 DIAGNOSIS — Z9989 Dependence on other enabling machines and devices: Secondary | ICD-10-CM | POA: Diagnosis not present

## 2020-10-29 DIAGNOSIS — G4733 Obstructive sleep apnea (adult) (pediatric): Secondary | ICD-10-CM

## 2020-10-29 DIAGNOSIS — I209 Angina pectoris, unspecified: Secondary | ICD-10-CM

## 2020-10-29 DIAGNOSIS — H353113 Nonexudative age-related macular degeneration, right eye, advanced atrophic without subfoveal involvement: Secondary | ICD-10-CM

## 2020-10-29 HISTORY — DX: Obstructive sleep apnea (adult) (pediatric): Z99.89

## 2020-10-29 HISTORY — DX: Obstructive sleep apnea (adult) (pediatric): G47.33

## 2020-10-29 NOTE — Progress Notes (Signed)
10/29/2020     CHIEF COMPLAINT Patient presents for  Chief Complaint  Patient presents with   Retina Follow Up    6 MO FU OU   Pt reports blurry vision especially while reading OU, no new F/F OU, no pain or pressure OU.       HISTORY OF PRESENT ILLNESS: Heather Chavez is a 75 y.o. female who presents to the clinic today for:   HPI     Retina Follow Up           Diagnosis: Wet AMD   Laterality: right eye   Onset: 6 months ago   Duration: 6 months   Comments: 6 MO FU OU   Pt reports blurry vision especially while reading OU, no new F/F OU, no pain or pressure OU.          Comments   6 mos fu ou oct Patient states "vision seems to be getting worse, I have a terrible time seeing at a distance." Denies any new floaters or FOL. BS: this morning 124 A1C: Pt states "7.8 I think last time it was checked." Pt states she has "recently gone on the cpap, about 2-3 weeks now on the new one. I had a different mask but it wasn't working with me so I switched it out and this one is better. I am still adjusting."        Last edited by Laurin Coder, COA on 10/29/2020 10:16 AM.      Referring physician: Ronita Hipps, MD [place] [id number],   HISTORICAL INFORMATION:   Selected notes from the MEDICAL RECORD NUMBER       CURRENT MEDICATIONS: No current outpatient medications on file. (Ophthalmic Drugs)   No current facility-administered medications for this visit. (Ophthalmic Drugs)   Current Outpatient Medications (Other)  Medication Sig   albuterol (VENTOLIN HFA) 108 (90 Base) MCG/ACT inhaler Inhale 2 puffs into the lungs every 4 (four) hours as needed for wheezing.   amoxicillin-clavulanate (AUGMENTIN) 875-125 MG tablet Take 1 tablet by mouth 2 (two) times daily.   Biotin 10000 MCG TABS Take 10,000 mcg by mouth daily.   DULoxetine (CYMBALTA) 60 MG capsule Take 60 mg by mouth daily.   flecainide (TAMBOCOR) 50 MG tablet Take 1 tablet  (50 mg total) by mouth 2 (two) times daily.   furosemide (LASIX) 20 MG tablet Take 20 mg by mouth as needed for fluid or edema.   gabapentin (NEURONTIN) 300 MG capsule Take 1 capsule by mouth at bedtime.   losartan-hydrochlorothiazide (HYZAAR) 100-25 MG tablet Take 0.05 tablets by mouth daily.   meloxicam (MOBIC) 15 MG tablet Take 15 mg by mouth daily.   metFORMIN (GLUCOPHAGE) 500 MG tablet Take 500 mg by mouth daily.   metoprolol succinate (TOPROL XL) 25 MG 24 hr tablet Take 1 tablet (25 mg total) by mouth 2 (two) times daily.   montelukast (SINGULAIR) 10 MG tablet Take 10 mg by mouth at bedtime.   Multiple Vitamins-Minerals (OCUVITE-LUTEIN PO) Take 1 tablet by mouth daily.   nitroGLYCERIN (NITROSTAT) 0.4 MG SL tablet Place 0.4 mg under the tongue every 5 (five) minutes as needed for chest pain.   omega-3 fish oil (MAXEPA) 1000 MG CAPS capsule Take 1,000 mg by mouth daily.   pantoprazole (PROTONIX) 40 MG tablet Take 1 tablet (40 mg total) by mouth daily.   potassium chloride (KLOR-CON) 10 MEQ tablet Take 10 mEq by mouth daily.   rosuvastatin (CRESTOR) 10  MG tablet Take 1 tablet (10 mg total) by mouth daily.   umeclidinium bromide (INCRUSE ELLIPTA) 62.5 MCG/INH AEPB Inhale 1 puff into the lungs daily.   vitamin E 180 MG (400 UNITS) capsule Take 400 Units by mouth daily.   zinc gluconate 50 MG tablet Take 50 mg by mouth daily.   No current facility-administered medications for this visit. (Other)      REVIEW OF SYSTEMS:    ALLERGIES Allergies  Allergen Reactions   Codeine Other (See Comments) and Nausea And Vomiting    Other reaction(s): Other (see comments), Vomiting   Acetaminophen Other (See Comments)    Oher reaction(s): Eye Redness, Other (see comments)     PAST MEDICAL HISTORY Past Medical History:  Diagnosis Date   Advanced nonexudative age-related macular degeneration of right eye with subfoveal involvement 09/21/2019   Allergic rhinitis    Angina pectoris ([place])  05/17/2020   Bilateral breast cancer ([place]) 11/23/2013   BMI 34.0-34.9,adult 09/25/2019   Breast cancer (HCC)    Bronchitis, chronic (HCC)    CAD (coronary artery disease)    Cancer (HCC)    Cardiac murmur 05/17/2020   Coronary artery calcification 05/17/2020   Degenerative retinal drusen of right eye 09/21/2019   Diabetes mellitus due to underlying condition with unspecified complications ([place]) [id number]   Diverticulosis    Dry eyes 06/25/2016   Dyspnea    Emphysema/COPD ([place])    Essential hypertension 05/17/2020   Estrogen receptor positive neoplasm 09/24/2015   Family history of malignant neoplasm of breast 11/23/2013   Family history of peritoneal cancer 11/23/2013   Fuchs' corneal dystrophy 11/03/2016   GERD (gastroesophageal reflux disease)    History of colon polyps    History of right breast cancer 09/19/2018   Hypertension    IBS (irritable bowel syndrome)    with D   Intermediate stage nonexudative age-related macular degeneration of left eye 09/21/2019   Iritis 11/03/2016   Macular degeneration    Major depressive disorder    Malignant neoplasm of upper-inner quadrant of left breast in female, estrogen receptor positive ([place]) 09/01/2016   Mixed dyslipidemia 05/17/2020   Obesity    Obesity (BMI 35.0-39.9 without comorbidity) 05/17/2020   Personal history of chemotherapy    Personal history of radiation therapy    Pseudophakia 09/21/2019   Right-sided chest wall pain    SVT (supraventricular tachycardia) ([place]) 05/23/2020   Varicose veins of left lower extremity with complications [id number]   Past Surgical History:  Procedure Laterality Date   BREAST BIOPSY     BREAST LUMPECTOMY     right brwast 2005/ left 2015   CATARACT EXTRACTION     CHOLECYSTECTOMY     COLONOSCOPY  04/17/2015   Colonic polyps status post polypectomy. Moderate to severe sigmoid diverticulosis   TUBAL LIGATION     VAGINAL HYSTERECTOMY     VEIN LIGATION AND STRIPPING      FAMILY HISTORY Family History   Problem Relation Age of Onset   Cancer Mother 24       bilateral breast cancer ages 74 then 8. Peritoneal cancer at age 58.   Other Mother        Ms. Tool's mother was adopted so have no information regarding maternal family  history other than her mother   Breast cancer Mother    Cancer Father 99       renal   Cancer Sister 19       breast   Breast cancer Sister  Cancer Paternal Aunt 30       breast   Cancer Paternal Grandmother        bilateral breast cancer at unknown age    SOCIAL HISTORY Social History   Tobacco Use   Smoking status: Former    Packs/day: 2.50    Years: 15.00    Pack years: 37.50    Types: Cigarettes    Quit date: 03/09/1981    Years since quitting: 39.6   Smokeless tobacco: Never  Vaping Use   Vaping Use: Never used  Substance Use Topics   Alcohol use: Yes   Drug use: No         OPHTHALMIC EXAM:  Base Eye Exam     Visual Acuity (ETDRS)       Right Left   Dist [place]wn 20/60 -1+2 20/30 -1+   Dist ph  NI     Correction: Glasses         Tonometry (Tonopen, 10:20 AM)       Right Left   Pressure 14 12         Pupils       Pupils Dark Light Shape React APD   Right PERRL 5 4 Round Brisk None   Left PERRL 5 4 Round Brisk None         Visual Fields (Counting fingers)       Left Right    Full Full         Extraocular Movement       Right Left    Full Full         Neuro/Psych     Oriented x3: Yes   Mood/Affect: Normal         Dilation     Both eyes: 1.0% Mydriacyl, 2.5% Phenylephrine @ 10:20 AM           Slit Lamp and Fundus Exam     External Exam       Right Left   External Normal Normal         Slit Lamp Exam       Right Left   Lids/Lashes Normal Normal   Conjunctiva/Sclera White and quiet White and quiet   Cornea Clear Clear   Anterior Chamber Deep and quiet Deep and quiet   Iris Round and reactive Round and reactive   Lens Posterior chamber intraocular lens Posterior chamber  intraocular lens   Anterior Vitreous Normal Normal         Fundus Exam       Right Left   Posterior Vitreous Posterior vitreous detachment Posterior vitreous detachment   Disc Normal Normal   C/D Ratio 0.15 0.05   Macula Geographic atrophy, in the faz, no macular thickening Retinal pigment epithelial mottling, Hard drusen, no macular thickening, no exudates, Geographic atrophy subfoveal   Vessels Normal Normal   Periphery Normal Normal            IMAGING AND PROCEDURES  Imaging and Procedures for 10/29/20  OCT, Retina - OU - Both Eyes       Right Eye Quality was good. Scan locations included subfoveal. Central Foveal Thickness: 231. Progression has worsened. Findings include abnormal foveal contour, subretinal scarring, central retinal atrophy.   Left Eye Quality was good. Scan locations included subfoveal. Central Foveal Thickness: 254. Progression has been stable. Findings include abnormal foveal contour, subretinal scarring, central retinal atrophy.   Notes Central outer atrophy progression over time with encroachment into the FAZ center OD, loss of  the easy and photoreceptor layer OU worse OD but also much less drusen deposition now that patient on CPAP therapy for sleep apnea these last 8 weeks             ASSESSMENT/PLAN:  OSA on CPAP Patient has been on CPAP now for 6 to 8 weeks with excellent compliance.  I explained the patient that this may in fact slow further outer retinal degeneration from macular hypoxic damage with untreated sleep apnea     ICD-10-CM   1. Advanced nonexudative age-related macular degeneration of right eye without subfoveal involvement  H35.3113 OCT, Retina - OU - Both Eyes    2. OSA on CPAP  G47.33    Z99.89       1.  OU with geographic atrophy, no signs of wet AMD.  2.  OCT reviewed with the patient and displayed.  There has been some progression OU but also there is less drusen deposition subfoveal left eye.  3.  Splane  to the patient the potential theoretical benefit of continued CPAP use for previously undiagnosed and untreated sleep apnea now preventing nightly macular hypoxic damage from occurring or accumulating  Ophthalmic Meds Ordered this visit:  No orders of the defined types were placed in this encounter.      Return in about 6 months (around 05/01/2021) for DILATE OU, COLOR FP, OCT.  There are no Patient Instructions on file for this visit.   Explained the diagnoses, plan, and follow up with the patient and they expressed understanding.  Patient expressed understanding of the importance of proper follow up care.   Clent Demark Maksim Peregoy M.D. Diseases & Surgery of the Retina and Vitreous [facility] [place] 10/29/20     Abbreviations: M myopia (nearsighted); A astigmatism; H hyperopia (farsighted); P presbyopia; Mrx spectacle prescription;  CTL contact lenses; OD right eye; OS left eye; OU both eyes  XT exotropia; ET esotropia; PEK punctate epithelial keratitis; PEE punctate epithelial erosions; DES dry eye syndrome; MGD meibomian gland dysfunction; ATs artificial tears; PFAT's preservative free artificial tears; [place] nuclear sclerotic cataract; PSC posterior subcapsular cataract; ERM epi-retinal membrane; PVD posterior vitreous detachment; RD retinal detachment; DM diabetes mellitus; DR diabetic retinopathy; NPDR non-proliferative diabetic retinopathy; PDR proliferative diabetic retinopathy; CSME clinically significant macular edema; DME diabetic macular edema; dbh dot blot hemorrhages; CWS cotton wool spot; POAG primary open angle glaucoma; C/D cup-to-disc ratio; HVF humphrey visual field; GVF goldmann visual field; OCT optical coherence tomography; IOP intraocular pressure; BRVO Branch retinal vein occlusion; CRVO central retinal vein occlusion; CRAO central retinal artery occlusion; BRAO branch retinal artery occlusion; RT retinal tear; SB scleral buckle; PPV pars plana vitrectomy; VH Vitreous  hemorrhage; PRP panretinal laser photocoagulation; IVK intravitreal kenalog; VMT vitreomacular traction; MH Macular hole;  NVD neovascularization of the disc; NVE neovascularization elsewhere; AREDS age related eye disease study; ARMD age related macular degeneration; POAG primary open angle glaucoma; EBMD epithelial/anterior basement membrane dystrophy; ACIOL anterior chamber intraocular lens; IOL intraocular lens; PCIOL posterior chamber intraocular lens; Phaco/IOL phacoemulsification with intraocular lens placement; [place] photorefractive keratectomy; LASIK laser assisted in situ keratomileusis; HTN hypertension; DM diabetes mellitus; COPD chronic obstructive pulmonary disease

## 2020-10-29 NOTE — Assessment & Plan Note (Signed)
Patient has been on CPAP now for 6 to 8 weeks with excellent compliance.  I explained the patient that this may in fact slow further outer retinal degeneration from macular hypoxic damage with untreated sleep apnea

## 2020-11-06 DIAGNOSIS — Z961 Presence of intraocular lens: Secondary | ICD-10-CM | POA: Diagnosis not present

## 2020-11-06 DIAGNOSIS — H04123 Dry eye syndrome of bilateral lacrimal glands: Secondary | ICD-10-CM | POA: Diagnosis not present

## 2020-11-06 DIAGNOSIS — H2181 Floppy iris syndrome: Secondary | ICD-10-CM | POA: Diagnosis not present

## 2020-11-06 DIAGNOSIS — H353134 Nonexudative age-related macular degeneration, bilateral, advanced atrophic with subfoveal involvement: Secondary | ICD-10-CM | POA: Diagnosis not present

## 2020-11-06 DIAGNOSIS — E119 Type 2 diabetes mellitus without complications: Secondary | ICD-10-CM | POA: Diagnosis not present

## 2020-11-11 DIAGNOSIS — Z20822 Contact with and (suspected) exposure to covid-19: Secondary | ICD-10-CM | POA: Diagnosis not present

## 2020-11-13 ENCOUNTER — Telehealth: Payer: Self-pay | Admitting: Cardiology

## 2020-11-13 MED ORDER — EZETIMIBE 10 MG PO TABS: 10.0000 mg | ORAL_TABLET | Freq: Every day | ORAL | 3 refills | Status: DC

## 2020-11-13 MED ORDER — PITAVASTATIN CALCIUM 2 MG PO TABS: 2.0000 mg | ORAL_TABLET | Freq: Every day | ORAL | 12 refills | Status: DC

## 2020-11-13 NOTE — Addendum Note (Signed)
Addended by: Truddie Hidden on: 11/13/2020 01:39 PM   Modules accepted: Orders

## 2020-11-13 NOTE — Telephone Encounter (Signed)
Pt c/o medication issue:  1. Name of Medication: rosuvastatin (CRESTOR) 10 MG tablet (Expired)  2. How are you currently taking this medication (dosage and times per day)? Take 1 tablet (10 mg total) by mouth daily.  3. Are you having a reaction (difficulty breathing--STAT)? No   4. What is your medication issue? pt has stopped taking rosuvastatin Saturday.. pt says that it was causing pain in her arms, legs and feet.

## 2020-11-13 NOTE — Telephone Encounter (Signed)
Pt states that since stopping the medication she has no pain. How do you advise?

## 2020-11-13 NOTE — Telephone Encounter (Signed)
Recommendations reviewed with pt as per Dr. Revankar's note.  Pt verbalized understanding and had no additional questions.   

## 2020-11-18 ENCOUNTER — Other Ambulatory Visit: Payer: Self-pay

## 2020-11-19 ENCOUNTER — Ambulatory Visit: Payer: Medicare Other | Admitting: Cardiology

## 2020-11-20 ENCOUNTER — Ambulatory Visit (INDEPENDENT_AMBULATORY_CARE_PROVIDER_SITE_OTHER): Payer: Medicare Other | Admitting: Cardiology

## 2020-11-20 ENCOUNTER — Other Ambulatory Visit: Payer: Self-pay

## 2020-11-20 ENCOUNTER — Encounter: Payer: Self-pay | Admitting: Cardiology

## 2020-11-20 VITALS — BP 138/80 | HR 91 | Ht 65.6 in | Wt 210.6 lb

## 2020-11-20 DIAGNOSIS — E782 Mixed hyperlipidemia: Secondary | ICD-10-CM | POA: Diagnosis not present

## 2020-11-20 DIAGNOSIS — E669 Obesity, unspecified: Secondary | ICD-10-CM | POA: Diagnosis not present

## 2020-11-20 DIAGNOSIS — I2510 Atherosclerotic heart disease of native coronary artery without angina pectoris: Secondary | ICD-10-CM

## 2020-11-20 DIAGNOSIS — E088 Diabetes mellitus due to underlying condition with unspecified complications: Secondary | ICD-10-CM | POA: Diagnosis not present

## 2020-11-20 DIAGNOSIS — I10 Essential (primary) hypertension: Secondary | ICD-10-CM

## 2020-11-20 MED ORDER — PITAVASTATIN CALCIUM 2 MG PO TABS: 2.0000 mg | ORAL_TABLET | Freq: Every day | ORAL | 12 refills | Status: DC

## 2020-11-20 NOTE — Progress Notes (Signed)
Cardiology Office Note:    Date:  11/20/2020   ID:  Heather Chavez, DOB September 29, 1945, MRN [id number]  PCP:  Ronita Hipps, MD  Cardiologist:  Jenean Lindau, MD   Referring MD: Ronita Hipps, MD    ASSESSMENT:    1. Coronary artery disease involving native coronary artery of native heart without angina pectoris   2. Essential hypertension   3. Diabetes mellitus due to underlying condition with unspecified complications ([place])   4. Mixed dyslipidemia   5. Obesity (BMI 35.0-39.9 without comorbidity)    PLAN:    In order of problems listed above:  Coronary artery disease: Secondary prevention stressed with the patient.  Importance of compliance with diet medication stressed and she vocalized understanding.  Advised to walk at least half an hour a day 5 days a week and she promises to do so. Essential hypertension: Blood pressure stable and diet was emphasized.  Lifestyle modification urged.  Salt intake issues and exercise stressed. Mixed dyslipidemia: She has not started Livalo.  I told her the risks of this and she understands.  I told her to start 2 mg daily and liver lipid check in 6 weeks.  If she has any issues or symptoms or side effects she has body aches.  She will let us know.  She could not tolerate Crestor.  She is trying to try Livalo.  Benefits and potential risks explained.  If this does not work we may have to try Nexlizet at and she understands. Paroxysmal supraventricular tachycardia: Stable and asymptomatic.  She has stopped flecainide and has no symptoms and I am fine with this.  EKG done today reveals sinus rhythm and nonspecific ST-T changes. Obesity: And diabetes mellitus: Diet emphasized.  Weight reduction stressed.  Risks of obesity emphasized.  She understands.  She promises to do better.Patient will be seen in follow-up appointment in 4 months or earlier if the patient has any concerns    Medication Adjustments/Labs and Tests Ordered: Current medicines  are reviewed at length with the patient today.  Concerns regarding medicines are outlined above.  No orders of the defined types were placed in this encounter.  No orders of the defined types were placed in this encounter.    No chief complaint on file.    History of Present Illness:    Heather Chavez is a 75 y.o. female.  Patient has history of nonobstructive coronary artery disease, supraventricular tachycardia, essential hypertension dyslipidemia diabetes mellitus and obesity.  Overall she leads a sedentary lifestyle.  No chest pain orthopnea or PND.  The patient mentions to me that she does not exercise regularly.  Her palpitations have resolved.  She has stopped flecainide by herself.  At the time of my evaluation, the patient is alert awake oriented and in no distress.  Past Medical History:  Diagnosis Date   Advanced nonexudative age-related macular degeneration of right eye with subfoveal involvement 09/21/2019   Advanced nonexudative age-related macular degeneration of right eye without subfoveal involvement 09/21/2019   Allergic rhinitis    Angina pectoris ([place]) 05/17/2020   Bilateral breast cancer ([place]) 11/23/2013   BMI 34.0-34.9,adult 09/25/2019   Breast cancer (HCC)    Bronchitis, chronic (HCC)    CAD (coronary artery disease)    Cancer (HCC)    Cardiac murmur 05/17/2020   Coronary artery calcification 05/17/2020   Degenerative retinal drusen of right eye 09/21/2019   Diabetes mellitus due to underlying condition with unspecified complications ([place]) [id number]   Diverticulosis  Dry eyes 06/25/2016   Dyspnea    Emphysema/COPD (HCC)    Essential hypertension 05/17/2020   Estrogen receptor positive neoplasm 09/24/2015   Family history of malignant neoplasm of breast 11/23/2013   Family history of peritoneal cancer 11/23/2013   Fuchs' corneal dystrophy 11/03/2016   GERD (gastroesophageal reflux disease)    History of colon polyps    History of right breast cancer 09/19/2018    Hypertension    IBS (irritable bowel syndrome)    with D   Intermediate stage nonexudative age-related macular degeneration of left eye 09/21/2019   Iritis 11/03/2016   Macular degeneration    Major depressive disorder    Malignant neoplasm of upper-inner quadrant of left breast in female, estrogen receptor positive ([place]) 09/01/2016   Mixed dyslipidemia 05/17/2020   Obesity    Obesity (BMI 35.0-39.9 without comorbidity) 05/17/2020   OSA on CPAP 10/29/2020   Diagnosed some 3 months prior after pneumonia episode, Dr. Halford Chessman pulmonology   Personal history of chemotherapy    Personal history of radiation therapy    Pseudophakia 09/21/2019   Right-sided chest wall pain    SVT (supraventricular tachycardia) ([place]) 05/23/2020   Varicose veins of left lower extremity with complications [id number]    Past Surgical History:  Procedure Laterality Date   BREAST BIOPSY     BREAST LUMPECTOMY     right brwast 2005/ left 2015   CATARACT EXTRACTION     CHOLECYSTECTOMY     COLONOSCOPY  04/17/2015   Colonic polyps status post polypectomy. Moderate to severe sigmoid diverticulosis   TUBAL LIGATION     VAGINAL HYSTERECTOMY     VEIN LIGATION AND STRIPPING      Current Medications: Current Meds  Medication Sig   albuterol (VENTOLIN HFA) 108 (90 Base) MCG/ACT inhaler Inhale 2 puffs into the lungs every 4 (four) hours as needed for wheezing.   Biotin 10000 MCG TABS Take 10,000 mcg by mouth daily.   DULoxetine (CYMBALTA) 60 MG capsule Take 60 mg by mouth daily.   furosemide (LASIX) 20 MG tablet Take 20 mg by mouth as needed for fluid or edema.   gabapentin (NEURONTIN) 300 MG capsule Take 1 capsule by mouth at bedtime.   gabapentin (NEURONTIN) 400 MG capsule Take 400 mg by mouth 2 (two) times daily.   losartan-hydrochlorothiazide (HYZAAR) 100-25 MG tablet Take 0.05 tablets by mouth daily.   meloxicam (MOBIC) 15 MG tablet Take 15 mg by mouth daily.   metFORMIN (GLUCOPHAGE) 500 MG tablet Take 500 mg by mouth  daily.   metoprolol succinate (TOPROL XL) 25 MG 24 hr tablet Take 1 tablet (25 mg total) by mouth 2 (two) times daily.   montelukast (SINGULAIR) 10 MG tablet Take 10 mg by mouth at bedtime.   Multiple Vitamins-Minerals (OCUVITE-LUTEIN PO) Take 1 tablet by mouth daily.   nitroGLYCERIN (NITROSTAT) 0.4 MG SL tablet Place 0.4 mg under the tongue every 5 (five) minutes as needed for chest pain.   omega-3 fish oil (MAXEPA) 1000 MG CAPS capsule Take 1,000 mg by mouth daily.   pantoprazole (PROTONIX) 40 MG tablet Take 1 tablet (40 mg total) by mouth daily.   potassium chloride (KLOR-CON) 10 MEQ tablet Take 10 mEq by mouth daily.   umeclidinium bromide (INCRUSE ELLIPTA) 62.5 MCG/INH AEPB Inhale 1 puff into the lungs daily.   vitamin E 180 MG (400 UNITS) capsule Take 400 Units by mouth daily.   zinc gluconate 50 MG tablet Take 50 mg by mouth daily.  Allergies:   Codeine, Rosuvastatin, and Acetaminophen   Social History   Socioeconomic History   Marital status: Married    Spouse name: Not on file   Number of children: Not on file   Years of education: Not on file   Highest education level: Not on file  Occupational History   Not on file  Tobacco Use   Smoking status: Former    Packs/day: 2.50    Years: 15.00    Pack years: 37.50    Types: Cigarettes    Quit date: 03/09/1981    Years since quitting: 39.7   Smokeless tobacco: Never  Vaping Use   Vaping Use: Never used  Substance and Sexual Activity   Alcohol use: Yes   Drug use: No   Sexual activity: Not on file  Other Topics Concern   Not on file  Social History Narrative   Not on file   Social Determinants of Health   Financial Resource Strain: Not on file  Food Insecurity: Not on file  Transportation Needs: Not on file  Physical Activity: Not on file  Stress: Not on file  Social Connections: Not on file     Family History: The patient's family history includes Breast cancer in her mother and sister; Cancer in her  paternal grandmother; Cancer (age of onset: 77) in her sister; Cancer (age of onset: 36) in her mother and paternal aunt; Cancer (age of onset: 66) in her father; Other in her mother.  ROS:   Please see the history of present illness.    All other systems reviewed and are negative.  EKGs/Labs/Other Studies Reviewed:    The following studies were reviewed today: EKG reveals sinus rhythm and nonspecific ST-T changes   Recent Labs: 07/23/2020: TSH 4.390 09/13/2020: ALT 24; BUN 20; Creatinine, Ser 1.00; Potassium 4.2; Sodium 138  Recent Lipid Panel    Component Value Date/Time   CHOL 228 (H) 09/13/2020 0837   TRIG 124 09/13/2020 0837   HDL 53 09/13/2020 0837   CHOLHDL 4.3 09/13/2020 0837   LDLCALC 153 (H) 09/13/2020 0837    Physical Exam:    VS:  BP 138/80   Pulse 91   Ht 5' 5.6" (1.666 m)   Wt 210 lb 9.6 oz (95.5 kg)   SpO2 94%   BMI 34.41 kg/m     Wt Readings from Last 3 Encounters:  11/20/20 210 lb 9.6 oz (95.5 kg)  07/30/20 212 lb 3.2 oz (96.3 kg)  07/23/20 212 lb (96.2 kg)     GEN: Patient is in no acute distress HEENT: Normal NECK: No JVD; No carotid bruits LYMPHATICS: No lymphadenopathy CARDIAC: Hear sounds regular, 2/6 systolic murmur at the apex. RESPIRATORY:  Clear to auscultation without rales, wheezing or rhonchi  ABDOMEN: Soft, non-tender, non-distended MUSCULOSKELETAL:  No edema; No deformity  SKIN: Warm and dry NEUROLOGIC:  Alert and oriented x 3 PSYCHIATRIC:  Normal affect   Signed, Jenean Lindau, MD  11/20/2020 8:39 AM    [place]

## 2020-11-20 NOTE — Patient Instructions (Signed)
Medication Instructions:  Your physician has recommended you make the following change in your medication:   Start Livalo 2 mg daily.  *If you need a refill on your cardiac medications before your next appointment, please call your pharmacy*   Lab Work: Your physician recommends that you return for lab work in: 6 weeks You need to have labs done when you are fasting.  You can come Monday through Friday 8:30 am to 12:00 pm and 1:15 to 4:30. You do not need to make an appointment as the order has already been placed. The labs you are going to have done are BMET, LFT and Lipids.  If you have labs (blood work) drawn today and your tests are completely normal, you will receive your results only by: [place] (if you have MyChart) OR A paper copy in the mail If you have any lab test that is abnormal or we need to change your treatment, we will call you to review the results.   Testing/Procedures: None ordered   Follow-Up: At [facility], you and your health needs are our priority.  As part of our continuing mission to provide you with exceptional heart care, we have created designated Provider Care Teams.  These Care Teams include your primary Cardiologist (physician) and Advanced Practice Providers (APPs -  Physician Assistants and Nurse Practitioners) who all work together to provide you with the care you need, when you need it.  We recommend signing up for the patient portal called "MyChart".  Sign up information is provided on this After Visit Summary.  MyChart is used to connect with patients for Virtual Visits (Telemedicine).  Patients are able to view lab/test results, encounter notes, upcoming appointments, etc.  Non-urgent messages can be sent to your provider as well.   To learn more about what you can do with MyChart, go to [email or web address].    Your next appointment:   3 month(s)  The format for your next appointment:   In Person  Provider:   Jyl Heinz,  MD   Other Instructions NA

## 2020-11-26 ENCOUNTER — Ambulatory Visit: Payer: Medicare Other | Admitting: Pulmonary Disease

## 2020-12-04 ENCOUNTER — Other Ambulatory Visit: Payer: Self-pay

## 2020-12-04 ENCOUNTER — Encounter: Payer: Self-pay | Admitting: Pulmonary Disease

## 2020-12-04 ENCOUNTER — Ambulatory Visit (INDEPENDENT_AMBULATORY_CARE_PROVIDER_SITE_OTHER): Payer: Medicare Other | Admitting: Pulmonary Disease

## 2020-12-04 VITALS — BP 112/60 | HR 79 | Temp 98.3°F | Ht 65.0 in | Wt 213.4 lb

## 2020-12-04 DIAGNOSIS — J432 Centrilobular emphysema: Secondary | ICD-10-CM | POA: Diagnosis not present

## 2020-12-04 DIAGNOSIS — G4733 Obstructive sleep apnea (adult) (pediatric): Secondary | ICD-10-CM

## 2020-12-04 DIAGNOSIS — I209 Angina pectoris, unspecified: Secondary | ICD-10-CM

## 2020-12-04 DIAGNOSIS — J301 Allergic rhinitis due to pollen: Secondary | ICD-10-CM

## 2020-12-04 DIAGNOSIS — J4540 Moderate persistent asthma, uncomplicated: Secondary | ICD-10-CM | POA: Diagnosis not present

## 2020-12-04 DIAGNOSIS — J986 Disorders of diaphragm: Secondary | ICD-10-CM | POA: Diagnosis not present

## 2020-12-04 DIAGNOSIS — Z23 Encounter for immunization: Secondary | ICD-10-CM

## 2020-12-04 NOTE — Patient Instructions (Signed)
High dose flu shot today  Follow up in 1 year 

## 2020-12-04 NOTE — Progress Notes (Signed)
[facility]  Chief Complaint  Patient presents with   Follow-up    Wearing CPAP-doing good, no sob    Constitutional:  BP 112/60 (BP Location: Left Arm, Cuff Size: Large)   Pulse 79   Temp 98.3 F (36.8 C) (Temporal)   Ht 5\' 5"  (1.651 m)   Wt 213 lb 6.4 oz (96.8 kg)   SpO2 100%   BMI 35.51 kg/m   Past Medical History:  Breast cancer, Depression, GERD, HTN  Past Surgical History:  She  has a past surgical history that includes Breast lumpectomy; Breast biopsy; Cataract extraction; Vein ligation and stripping; Tubal ligation; Vaginal hysterectomy; Cholecystectomy; and Colonoscopy (04/17/2015).  Brief Summary:  Heather Chavez is a 75 y.o. female former smoker with emphysema, asthma, and obstructive sleep apnea.      Subjective:   Since her last visit she was started on CPAP.  Uses nightly.  Slowly getting used to the idea of wearing this.  Sleep is better.  No issue with mask fit or pressure setting.  She has been getting more post nasal drip and throat clearing over the past few weeks.  Not having headache, sneezing, or fever.  Uses flonase.   She tried incruse, but irritated her throat.  Her husband was getting wixela from the [place] and had several left over.  She started using this, and feels this has worked well.  Not having cough, wheeze, or sputum.  Physical Exam:   Appearance - well kempt   ENMT - no sinus tenderness, no oral exudate, no LAN, Mallampati 3 airway, no stridor  Respiratory - equal breath sounds bilaterally, no wheezing or rales  CV - s1s2 regular rate and rhythm, no murmurs  Ext - no clubbing, no edema  Skin - no rashes  Psych - normal mood and affect     Pulmonary testing:  PFT 06/20/20 >> FEV1 1.72 (75%), FEV1% 84, TLC 4.05 (77%), DLCO 62%  Chest Imaging:  CT chest 06/01/18 >> coronary calcification, emphysema, scar with volume loss RML and RLL CT chest 05/20/20 >> scarring in RML and  lingula Sniff test 07/01/20 >> elevated Rt diaphragm, but normal movement   Sleep Tests:  HST 06/21/20 >> AHI 21.4, SpO2 71% Auto CPAP 11/03/20 to 12/01/20 >> used on 29 of 30 nights with average 7 hrs 5 min.  Average AHI 6.4 with median CPAP 11 and 95 th percentile CPAP 14 cm H2O  Cardiac Tests:  Echo 05/21/20 >> EF greater than 70%  Social History:  She  reports that she quit smoking about 39 years ago. Her smoking use included cigarettes. She has a 37.50 pack-year smoking history. She has never used smokeless tobacco. She reports current alcohol use. She reports that she does not use drugs.  Family History:  Her family history includes Breast cancer in her mother and sister; Cancer in her paternal grandmother; Cancer (age of onset: 86) in her sister; Cancer (age of onset: 28) in her mother and paternal aunt; Cancer (age of onset: 41) in her father; Other in her mother.     Assessment/Plan:   Chronic cough from emphysema, asthma, and allergic rhinitis. - she wasn't able to tolerate symbicort before due to palpitations  - trelegy and incruse caused throat irritation - continue wixela 250 one puff bid, and singulair 10 mg qhs - prn albuterol - she can try salt water gargles and 1 tsp local honey prn - high dose flu shot today  Perennial  allergic rhinitis. - continue flonase - advised her to add nasal irrigation - prn azelastine if symptoms get worse  Obstructive sleep apnea. - she is compliant with CPAP and reports benefit from therapy - she uses [place] for her DME - continue auto CPAP 5 to 15 cm H2O  Obesity. - discussed importance of weight loss, particularly in relation to sleep apnea  Supraventricular tachycardia. - followed by Dr. Geraldo Pitter with Cardiology  Time Spent Involved in Patient Care on Day of Examination:  32 minutes  Follow up:   Patient Instructions  High dose flu shot today  Follow up in 1 year  Medication List:   Allergies as of  12/04/2020       Reactions   Codeine Other (See Comments), Nausea And Vomiting   Other reaction(s): Other (see comments), Vomiting   Rosuvastatin Other (See Comments)   Muscle pain   Acetaminophen Other (See Comments)   Oher reaction(s): Eye Redness, Other (see comments)        Medication List        Accurate as of December 04, 2020  9:28 AM. If you have any questions, ask your nurse or doctor.          STOP taking these medications    Incruse Ellipta 62.5 MCG/INH Aepb Generic drug: umeclidinium bromide Stopped by: Chesley Mires, MD       TAKE these medications    albuterol 108 (90 Base) MCG/ACT inhaler Commonly known as: VENTOLIN HFA Inhale 2 puffs into the lungs every 4 (four) hours as needed for wheezing.   Biotin 10000 MCG Tabs Take 10,000 mcg by mouth daily.   DULoxetine 60 MG capsule Commonly known as: CYMBALTA Take 60 mg by mouth daily.   fluticasone 50 MCG/ACT nasal spray Commonly known as: FLONASE Place 1 spray into both nostrils daily.   furosemide 20 MG tablet Commonly known as: LASIX Take 20 mg by mouth as needed for fluid or edema.   gabapentin 300 MG capsule Commonly known as: NEURONTIN Take 1 capsule by mouth at bedtime.   gabapentin 400 MG capsule Commonly known as: NEURONTIN Take 400 mg by mouth 2 (two) times daily.   losartan-hydrochlorothiazide 100-25 MG tablet Commonly known as: HYZAAR Take 0.05 tablets by mouth daily.   meloxicam 15 MG tablet Commonly known as: MOBIC Take 15 mg by mouth daily.   metFORMIN 500 MG tablet Commonly known as: GLUCOPHAGE Take 500 mg by mouth daily.   metoprolol succinate 25 MG 24 hr tablet Commonly known as: Toprol XL Take 1 tablet (25 mg total) by mouth 2 (two) times daily.   montelukast 10 MG tablet Commonly known as: SINGULAIR Take 10 mg by mouth at bedtime.   nitroGLYCERIN 0.4 MG SL tablet Commonly known as: NITROSTAT Place 0.4 mg under the tongue every 5 (five) minutes as needed for  chest pain.   OCUVITE-LUTEIN PO Take 1 tablet by mouth daily.   omega-3 fish oil 1000 MG Caps capsule Commonly known as: MAXEPA Take 1,000 mg by mouth daily.   pantoprazole 40 MG tablet Commonly known as: PROTONIX Take 1 tablet (40 mg total) by mouth daily.   Pitavastatin Calcium 4 MG Tabs Take 4 mg by mouth daily. What changed: Another medication with the same name was removed. Continue taking this medication, and follow the directions you see here. Changed by: Chesley Mires, MD   potassium chloride 10 MEQ tablet Commonly known as: KLOR-CON Take 10 mEq by mouth daily.   vitamin E 180 MG (  400 UNITS) capsule Take 400 Units by mouth daily.   Wixela Inhub 250-50 MCG/ACT Aepb Generic drug: fluticasone-salmeterol Inhale 1 puff into the lungs in the morning and at bedtime.   zinc gluconate 50 MG tablet Take 50 mg by mouth daily.        Signature:  Chesley Mires, MD [place] Pager - [phone] 12/04/2020, 9:28 AM

## 2021-01-17 ENCOUNTER — Other Ambulatory Visit: Payer: Self-pay | Admitting: Cardiology

## 2021-01-17 DIAGNOSIS — I209 Angina pectoris, unspecified: Secondary | ICD-10-CM

## 2021-01-18 DIAGNOSIS — Z23 Encounter for immunization: Secondary | ICD-10-CM | POA: Diagnosis not present

## 2021-01-20 NOTE — Telephone Encounter (Signed)
RX sent

## 2021-02-06 ENCOUNTER — Ambulatory Visit (INDEPENDENT_AMBULATORY_CARE_PROVIDER_SITE_OTHER): Payer: Medicare Other | Admitting: Cardiology

## 2021-02-06 ENCOUNTER — Encounter: Payer: Self-pay | Admitting: Cardiology

## 2021-02-06 ENCOUNTER — Other Ambulatory Visit: Payer: Self-pay

## 2021-02-06 VITALS — BP 116/60 | HR 86 | Ht 65.0 in | Wt 213.8 lb

## 2021-02-06 DIAGNOSIS — E782 Mixed hyperlipidemia: Secondary | ICD-10-CM

## 2021-02-06 DIAGNOSIS — E669 Obesity, unspecified: Secondary | ICD-10-CM | POA: Diagnosis not present

## 2021-02-06 DIAGNOSIS — I10 Essential (primary) hypertension: Secondary | ICD-10-CM | POA: Diagnosis not present

## 2021-02-06 DIAGNOSIS — I2510 Atherosclerotic heart disease of native coronary artery without angina pectoris: Secondary | ICD-10-CM | POA: Diagnosis not present

## 2021-02-06 MED ORDER — NEXLIZET 180-10 MG PO TABS: 1.0000 | ORAL_TABLET | Freq: Every day | ORAL | 3 refills | Status: DC

## 2021-02-06 NOTE — Progress Notes (Signed)
Cardiology Office Note:    Date:  02/06/2021   ID:  Heather Chavez, DOB [birth year], MRN [id number]  PCP:  Ronita Hipps, MD  Cardiologist:  Jenean Lindau, MD   Referring MD: Ronita Hipps, MD    ASSESSMENT:    1. Coronary artery disease involving native coronary artery of native heart without angina pectoris   2. Primary hypertension   3. Mixed dyslipidemia   4. Obesity (BMI 35.0-39.9 without comorbidity)    PLAN:    In order of problems listed above:  Coronary artery disease: Secondary prevention stressed with the patient.  Importance of compliance with diet medication stressed and she vocalized understanding.  She was advised to walk at least half an hour a day 5 days a week and she promises to do so. Essential hypertension: Blood pressure stable and diet was emphasized.  Lifestyle modification urged. Mixed dyslipidemia: Lipids are markedly elevated.  She could not tolerate Livalo.  We will initiate her on Nexlizet and once we get her blood work that she will come for tomorrow morning fasting.  Benefits and potential risks explained and she vocalized understanding and questions were answered to her satisfaction. Obesity: Weight reduction stressed.  Compliance with dietary instructions emphasized and she promises to do better. Patient will be seen in follow-up appointment in 6 months or earlier if the patient has any concerns    Medication Adjustments/Labs and Tests Ordered: Current medicines are reviewed at length with the patient today.  Concerns regarding medicines are outlined above.  No orders of the defined types were placed in this encounter.  No orders of the defined types were placed in this encounter.    No chief complaint on file.    History of Present Illness:    Heather Chavez is a 75 y.o. female.  Patient has past medical history of coronary artery disease, essential hypertension and dyslipidemia.  She leads a sedentary lifestyle and is  obese.  No chest pain orthopnea or PND.  At the time of my evaluation, the patient is alert awake oriented and in no distress.  Past Medical History:  Diagnosis Date   Advanced nonexudative age-related macular degeneration of right eye with subfoveal involvement 09/21/2019   Allergic rhinitis    Angina pectoris ([place]) 05/17/2020   Bilateral breast cancer ([place]) 11/23/2013   BMI 34.0-34.9,adult 09/25/2019   Breast cancer (HCC)    Bronchitis, chronic (HCC)    CAD (coronary artery disease)    Cancer (HCC)    Cardiac murmur 05/17/2020   Coronary artery calcification 05/17/2020   Degenerative retinal drusen of right eye 09/21/2019   Diabetes mellitus due to underlying condition with unspecified complications ([place]) 08/67/6195   Diverticulosis    Dry eyes 06/25/2016   Dyspnea    Emphysema/COPD ([place])    Essential hypertension 05/17/2020   Estrogen receptor positive neoplasm 09/24/2015   Family history of malignant neoplasm of breast 11/23/2013   Family history of peritoneal cancer 11/23/2013   Fuchs' corneal dystrophy 11/03/2016   GERD (gastroesophageal reflux disease)    History of colon polyps    History of right breast cancer 09/19/2018   Hypertension    IBS (irritable bowel syndrome)    with D   Intermediate stage nonexudative age-related macular degeneration of left eye 09/21/2019   Iritis 11/03/2016   Macular degeneration    Major depressive disorder    Malignant neoplasm of upper-inner quadrant of left breast in female, estrogen receptor positive ([place]) 09/01/2016   Mixed dyslipidemia  05/17/2020   Obesity    Obesity (BMI 35.0-39.9 without comorbidity) 05/17/2020   OSA on CPAP 10/29/2020   Diagnosed some 3 months prior after pneumonia episode, Dr. Halford Chessman pulmonology   Personal history of chemotherapy    Personal history of radiation therapy    Pseudophakia 09/21/2019   Right-sided chest wall pain    SVT (supraventricular tachycardia) ([place]) 05/23/2020   Varicose veins of left  lower extremity with complications 42/68/3419    Past Surgical History:  Procedure Laterality Date   BREAST BIOPSY     BREAST LUMPECTOMY     right brwast 2005/ left 2015   CATARACT EXTRACTION     CHOLECYSTECTOMY     COLONOSCOPY  04/17/2015   Colonic polyps status post polypectomy. Moderate to severe sigmoid diverticulosis   TUBAL LIGATION     VAGINAL HYSTERECTOMY     VEIN LIGATION AND STRIPPING      Current Medications: Current Meds  Medication Sig   albuterol (VENTOLIN HFA) 108 (90 Base) MCG/ACT inhaler Inhale 2 puffs into the lungs every 4 (four) hours as needed for wheezing.   Biotin 10000 MCG TABS Take 10,000 mcg by mouth daily.   DULoxetine (CYMBALTA) 60 MG capsule Take 60 mg by mouth daily.   fluticasone (FLONASE) 50 MCG/ACT nasal spray Place 1 spray into both nostrils daily.   fluticasone-salmeterol (ADVAIR) 250-50 MCG/ACT AEPB Inhale 1 puff into the lungs in the morning and at bedtime.   furosemide (LASIX) 20 MG tablet Take 20 mg by mouth as needed for fluid or edema.   gabapentin (NEURONTIN) 400 MG capsule Take 400 mg by mouth 2 (two) times daily.   losartan-hydrochlorothiazide (HYZAAR) 100-25 MG tablet Take 0.05 tablets by mouth daily.   meloxicam (MOBIC) 15 MG tablet Take 15 mg by mouth daily.   metFORMIN (GLUCOPHAGE) 500 MG tablet Take 500 mg by mouth daily.   metoprolol succinate (TOPROL-XL) 25 MG 24 hr tablet TAKE 1 TABLET(25 MG) BY MOUTH TWICE DAILY   montelukast (SINGULAIR) 10 MG tablet Take 10 mg by mouth at bedtime.   Multiple Vitamins-Minerals (OCUVITE-LUTEIN PO) Take 1 tablet by mouth daily.   nitroGLYCERIN (NITROSTAT) 0.4 MG SL tablet Place 0.4 mg under the tongue every 5 (five) minutes as needed for chest pain.   omega-3 fish oil (MAXEPA) 1000 MG CAPS capsule Take 1,000 mg by mouth daily.   pantoprazole (PROTONIX) 40 MG tablet Take 1 tablet (40 mg total) by mouth daily.   potassium chloride (KLOR-CON) 10 MEQ tablet Take 10 mEq by mouth daily.   vitamin E 180  MG (400 UNITS) capsule Take 400 Units by mouth daily.   zinc gluconate 50 MG tablet Take 50 mg by mouth daily.     Allergies:   Codeine, Pitavastatin, Rosuvastatin, and Acetaminophen   Social History   Socioeconomic History   Marital status: Married    Spouse name: Not on file   Number of children: Not on file   Years of education: Not on file   Highest education level: Not on file  Occupational History   Not on file  Tobacco Use   Smoking status: Former    Packs/day: 2.50    Years: 15.00    Pack years: 37.50    Types: Cigarettes    Quit date: 03/09/1981    Years since quitting: 39.9   Smokeless tobacco: Never  Vaping Use   Vaping Use: Never used  Substance and Sexual Activity   Alcohol use: Yes   Drug use: No  Sexual activity: Not on file  Other Topics Concern   Not on file  Social History Narrative   Not on file   Social Determinants of Health   Financial Resource Strain: Not on file  Food Insecurity: Not on file  Transportation Needs: Not on file  Physical Activity: Not on file  Stress: Not on file  Social Connections: Not on file     Family History: The patient's family history includes Breast cancer in her mother and sister; Cancer in her paternal grandmother; Cancer (age of onset: 47) in her sister; Cancer (age of onset: 8) in her mother and paternal aunt; Cancer (age of onset: 19) in her father; Other in her mother.  ROS:   Please see the history of present illness.    All other systems reviewed and are negative.  EKGs/Labs/Other Studies Reviewed:    The following studies were reviewed today: 07/02/2020 14:22   CLINICAL DATA:  72F with coronary calcification, SVT, and hypertension on flecainide.   EXAM: Cardiac/Coronary  CT   TECHNIQUE: The patient was scanned on a [employer].   FINDINGS: A 120 kV prospective scan was triggered in the descending thoracic aorta at 111 HU's. Axial non-contrast 3 mm slices were carried out through  the heart. The data set was analyzed on a dedicated work station and scored using the [place]. Gantry rotation speed was 250 msecs and collimation was .6 mm. No beta blockade and 0.8 mg of sl NTG was given. The 3D data set was reconstructed in 5% intervals of the 67-82 % of the R-R cycle. Diastolic phases were analyzed on a dedicated work station using MPR, MIP and VRT modes. The patient received 80 cc of contrast.   Aorta: Normal size. Ascending aorta 2.9 cm. Minimal calcification in the descending aorta and aortic arch. No dissection.   Aortic Valve:  Trileaflet.  Mild calcification.   Coronary Arteries:  Normal coronary origin.  Left dominance.   RCA is a small, non-dominant artery that gives rise to PDA and PLVB. There is no plaque.   Left main is a large artery that gives rise to LAD, LCX, and RI arteries. There is no calcification.   LAD is a large vessel that has minimal (<25%) calcified plaque proximally and in the mid LAD. There are two diagonal vessels without plaque.   LCX is a large dominant artery that gives rise to two OM branches and L-PDA. There is minimal (<25%) calcified plaque in the proximal LCX. There is no plaque in the OM branches.   RI is a small vessel without plaque.   Coronary Calcium Score:   Left main: 0   Left anterior descending artery: 43.8   Left circumflex artery: 40.2   Right coronary artery: 0   Total: 84.0   Percentile: 62nd   Other findings:   Normal pulmonary vein drainage into the left atrium.   Normal let atrial appendage without a thrombus.   Normal size of the pulmonary artery.   Mitral annular calcification.   IMPRESSION: 1. Coronary calcium score of 84. This was 62nd percentile for age-, race-, and sex-matched controls.   2. Normal coronary origin with left dominance.   3.  Minimal plaque in the LAD and LCX.   4.  No obstructive coronary artery disease.   Skeet Latch, MD     Electronically  Signed   By: Skeet Latch   On: 07/02/2020 14:22   Recent Labs: 07/23/2020: TSH 4.390 09/13/2020: ALT 24; BUN  20; Creatinine, Ser 1.00; Potassium 4.2; Sodium 138  Recent Lipid Panel    Component Value Date/Time   CHOL 228 (H) 09/13/2020 0837   TRIG 124 09/13/2020 0837   HDL 53 09/13/2020 0837   CHOLHDL 4.3 09/13/2020 0837   LDLCALC 153 (H) 09/13/2020 0837    Physical Exam:    VS:  BP 116/60   Pulse 86   Ht 5\' 5"  (1.651 m)   Wt 213 lb 12.8 oz (97 kg)   SpO2 96%   BMI 35.58 kg/m     Wt Readings from Last 3 Encounters:  02/06/21 213 lb 12.8 oz (97 kg)  12/04/20 213 lb 6.4 oz (96.8 kg)  11/20/20 210 lb 9.6 oz (95.5 kg)     GEN: Patient is in no acute distress HEENT: Normal NECK: No JVD; No carotid bruits LYMPHATICS: No lymphadenopathy CARDIAC: Hear sounds regular, 2/6 systolic murmur at the apex. RESPIRATORY:  Clear to auscultation without rales, wheezing or rhonchi  ABDOMEN: Soft, non-tender, non-distended MUSCULOSKELETAL:  No edema; No deformity  SKIN: Warm and dry NEUROLOGIC:  Alert and oriented x 3 PSYCHIATRIC:  Normal affect   Signed, Jenean Lindau, MD  02/06/2021 1:31 PM    [facility]

## 2021-02-06 NOTE — Patient Instructions (Signed)
Medication Instructions:  Your physician has recommended you make the following change in your medication:   Start Nexlizet 180-10 mg daily.  *If you need a refill on your cardiac medications before your next appointment, please call your pharmacy*   Lab Work: Your physician recommends that you return for lab work in: the next few days. You need to have labs done when you are fasting.  You can come Monday through Friday 8:30 am to 12:00 pm and 1:15 to 4:30. You do not need to make an appointment as the order has already been placed. The labs you are going to have done are BMET, LFT and Lipids.  If you have labs (blood work) drawn today and your tests are completely normal, you will receive your results only by: [place] (if you have MyChart) OR A paper copy in the mail If you have any lab test that is abnormal or we need to change your treatment, we will call you to review the results.   Testing/Procedures: None ordered   Follow-Up: At [facility], you and your health needs are our priority.  As part of our continuing mission to provide you with exceptional heart care, we have created designated Provider Care Teams.  These Care Teams include your primary Cardiologist (physician) and Advanced Practice Providers (APPs -  Physician Assistants and Nurse Practitioners) who all work together to provide you with the care you need, when you need it.  We recommend signing up for the patient portal called "MyChart".  Sign up information is provided on this After Visit Summary.  MyChart is used to connect with patients for Virtual Visits (Telemedicine).  Patients are able to view lab/test results, encounter notes, upcoming appointments, etc.  Non-urgent messages can be sent to your provider as well.   To learn more about what you can do with MyChart, go to [email or web address].    Your next appointment:   6 month(s)  The format for your next appointment:   In Person  Provider:    Jyl Heinz, MD   Other Instructions NA

## 2021-02-10 ENCOUNTER — Telehealth: Payer: Self-pay | Admitting: *Deleted

## 2021-02-10 DIAGNOSIS — I2510 Atherosclerotic heart disease of native coronary artery without angina pectoris: Secondary | ICD-10-CM | POA: Diagnosis not present

## 2021-02-10 DIAGNOSIS — E782 Mixed hyperlipidemia: Secondary | ICD-10-CM | POA: Diagnosis not present

## 2021-02-10 NOTE — Telephone Encounter (Signed)
Submitted PA for Nexlizet 180-10mg  to Cover My Meds

## 2021-02-11 LAB — BASIC METABOLIC PANEL
BUN/Creatinine Ratio: 25 (ref 12–28)
BUN: 27 mg/dL (ref 8–27)
CO2: 28 mmol/L (ref 20–29)
Calcium: 9.5 mg/dL (ref 8.7–10.3)
Chloride: 102 mmol/L (ref 96–106)
Creatinine, Ser: 1.09 mg/dL — ABNORMAL HIGH (ref 0.57–1.00)
Glucose: 125 mg/dL — ABNORMAL HIGH (ref 70–99)
Potassium: 4.1 mmol/L (ref 3.5–5.2)
Sodium: 143 mmol/L (ref 134–144)
eGFR: 53 mL/min/{1.73_m2} — ABNORMAL LOW (ref >59)

## 2021-02-11 LAB — LIPID PANEL
Chol/HDL Ratio: 4.8 ratio — ABNORMAL HIGH (ref 0.0–4.4)
Cholesterol, Total: 245 mg/dL — ABNORMAL HIGH (ref 100–199)
HDL: 51 mg/dL (ref >39)
LDL Chol Calc (NIH): 161 mg/dL — ABNORMAL HIGH (ref 0–99)
Triglycerides: 183 mg/dL — ABNORMAL HIGH (ref 0–149)
VLDL Cholesterol Cal: 33 mg/dL (ref 5–40)

## 2021-02-11 LAB — HEPATIC FUNCTION PANEL
ALT: 21 IU/L (ref 0–32)
AST: 23 IU/L (ref 0–40)
Albumin: 4.4 g/dL (ref 3.7–4.7)
Alkaline Phosphatase: 76 IU/L (ref 44–121)
Bilirubin Total: 0.3 mg/dL (ref 0.0–1.2)
Bilirubin, Direct: <0.1 mg/dL (ref 0.00–0.40)
Total Protein: 6.7 g/dL (ref 6.0–8.5)

## 2021-02-11 NOTE — Telephone Encounter (Signed)
PA approved for Nexlizet 180 mg from 02/06/21 until further notice.

## 2021-02-12 NOTE — Addendum Note (Signed)
Addended by: Truddie Hidden on: 02/12/2021 02:11 PM   Modules accepted: Orders

## 2021-02-14 ENCOUNTER — Telehealth: Payer: Self-pay | Admitting: Cardiology

## 2021-02-14 NOTE — Telephone Encounter (Signed)
Patient returned for lab results.

## 2021-02-14 NOTE — Telephone Encounter (Signed)
Results reviewed with pt as per Dr. Revankar's note.  Pt verbalized understanding and had no additional questions. Routed to PCP.  

## 2021-03-21 ENCOUNTER — Other Ambulatory Visit: Payer: Self-pay

## 2021-03-21 ENCOUNTER — Telehealth: Payer: Self-pay | Admitting: Pulmonary Disease

## 2021-03-21 DIAGNOSIS — I209 Angina pectoris, unspecified: Secondary | ICD-10-CM

## 2021-03-21 MED ORDER — FLUTICASONE-SALMETEROL 250-50 MCG/ACT IN AEPB: 1.0000 | INHALATION_SPRAY | Freq: Two times a day (BID) | RESPIRATORY_TRACT | 3 refills | Status: DC

## 2021-03-21 MED ORDER — METOPROLOL SUCCINATE ER 25 MG PO TB24: 25.0000 mg | ORAL_TABLET | Freq: Two times a day (BID) | ORAL | 2 refills | Status: DC

## 2021-03-21 NOTE — Telephone Encounter (Signed)
Rx for pt's Heather Chavez has been sent to preferred pharmacy for pt. Called spoke with pt letting her know this had been done and she verbalized understanding. Nothing further needed.

## 2021-03-27 ENCOUNTER — Other Ambulatory Visit: Payer: Self-pay

## 2021-03-27 MED ORDER — PANTOPRAZOLE SODIUM 40 MG PO TBEC: 40.0000 mg | DELAYED_RELEASE_TABLET | Freq: Every day | ORAL | 0 refills | Status: AC

## 2021-04-08 NOTE — Telephone Encounter (Signed)
[place] aware that pt's PA has been approved.

## 2021-04-11 ENCOUNTER — Other Ambulatory Visit: Payer: Self-pay | Admitting: Vascular Surgery

## 2021-04-11 DIAGNOSIS — Z1231 Encounter for screening mammogram for malignant neoplasm of breast: Secondary | ICD-10-CM

## 2021-04-15 DIAGNOSIS — Z20828 Contact with and (suspected) exposure to other viral communicable diseases: Secondary | ICD-10-CM | POA: Diagnosis not present

## 2021-04-15 DIAGNOSIS — J180 Bronchopneumonia, unspecified organism: Secondary | ICD-10-CM | POA: Diagnosis not present

## 2021-04-22 ENCOUNTER — Telehealth: Payer: Self-pay | Admitting: Cardiology

## 2021-04-22 NOTE — Telephone Encounter (Signed)
Spoke to Heather Chavez, patient now has [employer] fast streamer for this calender year. She is faxing this information because I tried calling the patient and was unable to reach her.

## 2021-04-22 NOTE — Telephone Encounter (Signed)
Pt c/o medication issue:  1. Name of Medication: Nexlizet   2. How are you currently taking this medication (dosage and times per day)? N/A  3. Are you having a reaction (difficulty breathing--STAT)? N/A  4. What is your medication issue? Pharmacy is calling in regards to PA for medication. States the patient's new insurance is medicare. Please advise.

## 2021-04-29 DIAGNOSIS — L570 Actinic keratosis: Secondary | ICD-10-CM | POA: Diagnosis not present

## 2021-04-29 DIAGNOSIS — L821 Other seborrheic keratosis: Secondary | ICD-10-CM | POA: Diagnosis not present

## 2021-04-29 DIAGNOSIS — L728 Other follicular cysts of the skin and subcutaneous tissue: Secondary | ICD-10-CM | POA: Diagnosis not present

## 2021-05-01 ENCOUNTER — Ambulatory Visit (INDEPENDENT_AMBULATORY_CARE_PROVIDER_SITE_OTHER): Payer: Medicare Other | Admitting: Ophthalmology

## 2021-05-01 ENCOUNTER — Telehealth: Payer: Self-pay

## 2021-05-01 ENCOUNTER — Encounter (INDEPENDENT_AMBULATORY_CARE_PROVIDER_SITE_OTHER): Payer: Self-pay | Admitting: Ophthalmology

## 2021-05-01 ENCOUNTER — Other Ambulatory Visit: Payer: Self-pay

## 2021-05-01 DIAGNOSIS — I209 Angina pectoris, unspecified: Secondary | ICD-10-CM | POA: Diagnosis not present

## 2021-05-01 DIAGNOSIS — E088 Diabetes mellitus due to underlying condition with unspecified complications: Secondary | ICD-10-CM

## 2021-05-01 DIAGNOSIS — H353114 Nonexudative age-related macular degeneration, right eye, advanced atrophic with subfoveal involvement: Secondary | ICD-10-CM | POA: Diagnosis not present

## 2021-05-01 DIAGNOSIS — H353122 Nonexudative age-related macular degeneration, left eye, intermediate dry stage: Secondary | ICD-10-CM

## 2021-05-01 DIAGNOSIS — Z9989 Dependence on other enabling machines and devices: Secondary | ICD-10-CM | POA: Diagnosis not present

## 2021-05-01 DIAGNOSIS — G4733 Obstructive sleep apnea (adult) (pediatric): Secondary | ICD-10-CM

## 2021-05-01 DIAGNOSIS — H353113 Nonexudative age-related macular degeneration, right eye, advanced atrophic without subfoveal involvement: Secondary | ICD-10-CM | POA: Diagnosis not present

## 2021-05-01 NOTE — Assessment & Plan Note (Signed)

## 2021-05-01 NOTE — Progress Notes (Signed)
05/01/2021     CHIEF COMPLAINT Patient presents for  Chief Complaint  Patient presents with   Macular Degeneration      HISTORY OF PRESENT ILLNESS: Heather Chavez is a 76 y.o. female who presents to the clinic today for:   HPI   Pt states no recent changes to medical history Pt states it has been blurry to her vision. Also states that pt develops a headache while reading small print.  Last edited by Silvestre Moment on 05/01/2021 10:40 AM.      Referring physician: Ronita Hipps, MD [place] [address],   HISTORICAL INFORMATION:   Selected notes from the MEDICAL RECORD NUMBER       CURRENT MEDICATIONS: No current outpatient medications on file. (Ophthalmic Drugs)   No current facility-administered medications for this visit. (Ophthalmic Drugs)   Current Outpatient Medications (Other)  Medication Sig   albuterol (VENTOLIN HFA) 108 (90 Base) MCG/ACT inhaler Inhale 2 puffs into the lungs every 4 (four) hours as needed for wheezing.   Bempedoic Acid-Ezetimibe (NEXLIZET) 180-10 MG TABS Take 1 tablet by mouth daily in the afternoon.   Biotin 10000 MCG TABS Take 10,000 mcg by mouth daily.   DULoxetine (CYMBALTA) 60 MG capsule Take 60 mg by mouth daily.   fluticasone (FLONASE) 50 MCG/ACT nasal spray Place 1 spray into both nostrils daily.   fluticasone-salmeterol (WIXELA INHUB) 250-50 MCG/ACT AEPB Inhale 1 puff into the lungs in the morning and at bedtime.   furosemide (LASIX) 20 MG tablet Take 20 mg by mouth as needed for fluid or edema.   gabapentin (NEURONTIN) 400 MG capsule Take 400 mg by mouth 2 (two) times daily.   losartan-hydrochlorothiazide (HYZAAR) 100-25 MG tablet Take 0.05 tablets by mouth daily.   meloxicam (MOBIC) 15 MG tablet Take 15 mg by mouth daily.   metFORMIN (GLUCOPHAGE) 500 MG tablet Take 500 mg by mouth daily.   metoprolol succinate (TOPROL-XL) 25 MG 24 hr tablet Take 1 tablet (25 mg total) by mouth 2 (two) times daily.    montelukast (SINGULAIR) 10 MG tablet Take 10 mg by mouth at bedtime.   Multiple Vitamins-Minerals (OCUVITE-LUTEIN PO) Take 1 tablet by mouth daily.   nitroGLYCERIN (NITROSTAT) 0.4 MG SL tablet Place 0.4 mg under the tongue every 5 (five) minutes as needed for chest pain.   omega-3 fish oil (MAXEPA) 1000 MG CAPS capsule Take 1,000 mg by mouth daily.   pantoprazole (PROTONIX) 40 MG tablet Take 1 tablet (40 mg total) by mouth daily. Call [phone] to schedule an office visit for more refills   potassium chloride (KLOR-CON) 10 MEQ tablet Take 10 mEq by mouth daily.   vitamin E 180 MG (400 UNITS) capsule Take 400 Units by mouth daily.   zinc gluconate 50 MG tablet Take 50 mg by mouth daily.   No current facility-administered medications for this visit. (Other)      REVIEW OF SYSTEMS:    ALLERGIES Allergies  Allergen Reactions   Codeine Other (See Comments) and Nausea And Vomiting    Other reaction(s): Other (see comments), Vomiting   Pitavastatin Other (See Comments)    Leg cramps   Rosuvastatin Other (See Comments)    Muscle pain   Acetaminophen Other (See Comments)    Oher reaction(s): Eye Redness, Other (see comments)     PAST MEDICAL HISTORY Past Medical History:  Diagnosis Date   Advanced nonexudative age-related macular degeneration of right eye with subfoveal involvement 09/21/2019   Allergic rhinitis  Angina pectoris ([place]) 05/17/2020   Bilateral breast cancer ([place]) 11/23/2013   BMI 34.0-34.9,adult 09/25/2019   Breast cancer (HCC)    Bronchitis, chronic (HCC)    CAD (coronary artery disease)    Cancer (HCC)    Cardiac murmur 05/17/2020   Coronary artery calcification 05/17/2020   Degenerative retinal drusen of right eye 09/21/2019   Diabetes mellitus due to underlying condition with unspecified complications ([place]) 16/12/9602   Diverticulosis    Dry eyes 06/25/2016   Dyspnea    Emphysema/COPD ([place])    Essential hypertension 05/17/2020   Estrogen receptor  positive neoplasm 09/24/2015   Family history of malignant neoplasm of breast 11/23/2013   Family history of peritoneal cancer 11/23/2013   Fuchs' corneal dystrophy 11/03/2016   GERD (gastroesophageal reflux disease)    History of colon polyps    History of right breast cancer 09/19/2018   Hypertension    IBS (irritable bowel syndrome)    with D   Intermediate stage nonexudative age-related macular degeneration of left eye 09/21/2019   Iritis 11/03/2016   Macular degeneration    Major depressive disorder    Malignant neoplasm of upper-inner quadrant of left breast in female, estrogen receptor positive ([place]) 09/01/2016   Mixed dyslipidemia 05/17/2020   Obesity    Obesity (BMI 35.0-39.9 without comorbidity) 05/17/2020   OSA on CPAP 10/29/2020   Diagnosed some 3 months prior after pneumonia episode, Dr. Halford Chessman pulmonology   Personal history of chemotherapy    Personal history of radiation therapy    Pseudophakia 09/21/2019   Right-sided chest wall pain    SVT (supraventricular tachycardia) ([place]) 05/23/2020   Varicose veins of left lower extremity with complications 54/11/8117   Past Surgical History:  Procedure Laterality Date   BREAST BIOPSY     BREAST LUMPECTOMY     right brwast 2005/ left 2015   CATARACT EXTRACTION     CHOLECYSTECTOMY     COLONOSCOPY  04/17/2015   Colonic polyps status post polypectomy. Moderate to severe sigmoid diverticulosis   TUBAL LIGATION     VAGINAL HYSTERECTOMY     VEIN LIGATION AND STRIPPING      FAMILY HISTORY Family History  Problem Relation Age of Onset   Cancer Mother 49       bilateral breast cancer ages 71 then 53. Peritoneal cancer at age 15.   Other Mother        Ms. Worthy's mother was adopted so have no information regarding maternal family  history other than her mother   Breast cancer Mother    Cancer Father 46       renal   Cancer Sister 48       breast   Breast cancer Sister    Cancer Paternal Aunt 30       breast   Cancer  Paternal Grandmother        bilateral breast cancer at unknown age    SOCIAL HISTORY Social History   Tobacco Use   Smoking status: Former    Packs/day: 2.50    Years: 15.00    Pack years: 37.50    Types: Cigarettes    Quit date: 03/09/1981    Years since quitting: 40.1   Smokeless tobacco: Never  Vaping Use   Vaping Use: Never used  Substance Use Topics   Alcohol use: Yes   Drug use: No         OPHTHALMIC EXAM:  Base Eye Exam     Visual Acuity (ETDRS)  Right Left   Dist cc 20/60 -1 20/30 -2   Dist ph [place] 20/60 +1 20/30 -3    Correction: Glasses         Tonometry (Tonopen, 10:46 AM)       Right Left   Pressure 8 11         Neuro/Psych     Oriented x3: Yes   Mood/Affect: Normal         Dilation     Both eyes: 1.0% Mydriacyl, 2.5% Phenylephrine @ 10:46 AM           Slit Lamp and Fundus Exam     External Exam       Right Left   External Normal Normal         Slit Lamp Exam       Right Left   Lids/Lashes Normal Normal   Conjunctiva/Sclera White and quiet White and quiet   Cornea Clear Clear   Anterior Chamber Deep and quiet Deep and quiet   Iris Round and reactive Round and reactive   Lens Posterior chamber intraocular lens Posterior chamber intraocular lens   Anterior Vitreous Normal Normal         Fundus Exam       Right Left   Posterior Vitreous Posterior vitreous detachment Posterior vitreous detachment   Disc Normal Normal   C/D Ratio 0.15 0.05   Macula Geographic atrophy, in the faz, no macular thickening Retinal pigment epithelial mottling, Hard drusen, no macular thickening, no exudates, Geographic atrophy subfoveal   Vessels Normal Normal   Periphery Normal Normal            IMAGING AND PROCEDURES  Imaging and Procedures for 05/01/21  OCT, Retina - OU - Both Eyes       Right Eye Quality was good. Scan locations included subfoveal. Central Foveal Thickness: 192. Progression has worsened. Findings  include abnormal foveal contour, subretinal scarring, central retinal atrophy.   Left Eye Quality was good. Scan locations included subfoveal. Central Foveal Thickness: 261. Progression has been stable. Findings include abnormal foveal contour, subretinal scarring, central retinal atrophy.   Notes Central outer atrophy progression over time with encroachment into the FAZ center OD, loss of the EZ and photoreceptor layer OU worse OD but also much less drusen deposition now that patient on CPAP therapy for sleep apnea these last 8 weeks,  Now with less subretinal hyper reflective Outer retinal material, OS       Color Fundus Photography Optos - OU - Both Eyes       Right Eye Progression has no prior data. Disc findings include normal observations. Macula : drusen, geographic atrophy. Vessels : normal observations. Periphery : normal observations.   Left Eye Progression has no prior data. Disc findings include normal observations. Macula : geographic atrophy, drusen. Vessels : normal observations. Periphery : normal observations.              ASSESSMENT/PLAN:  Advanced nonexudative age-related macular degeneration of right eye with subfoveal involvement The nature of dry age related macular degeneration was discussed with the patient as well as its possible conversion to wet. The results of the AREDS 2 study was discussed with the patient. A diet rich in dark leafy green vegetables was advised and specific recommendations were made regarding supplements with AREDS 2 formulation . Control of hypertension and serum cholesterol may slow the disease. Smoking cessation is mandatory to slow the disease and diminish the risk of progressing to wet age  related macular degeneration. The patient was instructed in the use of an [place] and was told to return immediately for any changes in the Grid. Stressed to the patient do not rub eyes   Subfoveal atrophy remain   OSA on CPAP Patient  has now been on CPAP use continuous since approximately August 2022.  She reports no salient improvement in her sense of wellbeing or energy level and next day however.  Nonetheless reminded her of the importance of treating of on sleep apnea so as to minimize the risk of onset of stroke heart attack, and in my opinion progression of macular eye disease as the macular requires more oxygen than any other tissue in the body  Intermediate stage nonexudative age-related macular degeneration of left eye The nature of age--related macular degeneration was discussed with the patient as well as the distinction between dry and wet types. Checking an [place] Grid daily with advice to return immediately should a distortion develop, was given to the patient. The patient 's smoking status now and in the past was determined and advice based on the AREDS study was provided regarding the consumption of antioxidant supplements. AREDS 2 vitamin formulation was recommended. Consumption of dark leafy vegetables and fresh fruits of various colors was recommended. Treatment modalities for wet macular degeneration particularly the use of intravitreal injections of anti-blood vessel growth factors was discussed with the patient. Avastin, Lucentis, and Eylea are the available options. On occasion, therapy includes the use of photodynamic therapy and thermal laser. Stressed to the patient do not rub eyes.  Patient was advised to check [place] Grid daily and return immediately if changes are noted. Instructions on using the grid were given to the patient. All patient questions were answered.  Diabetes mellitus due to underlying condition with unspecified complications ([place]) No detectable diabetic retinopathy today     ICD-10-CM   1. Advanced nonexudative age-related macular degeneration of right eye without subfoveal involvement  H35.3113 OCT, Retina - OU - Both Eyes    Color Fundus Photography Optos - OU - Both Eyes    2.  Advanced nonexudative age-related macular degeneration of right eye with subfoveal involvement  H35.3114     3. OSA on CPAP  G47.33    Z99.89     4. Intermediate stage nonexudative age-related macular degeneration of left eye  H35.3122     5. Diabetes mellitus due to underlying condition with unspecified complications ([place])  B26.2       1.  OU, with stable intermediate AMD with elements of dry atrophic geographic atrophy and regions of previously large subfoveal drusenoid and hyper reflective material.  2.  OU now a benefit from less macular ischemia nightly from untreated sleep apnea, as the patient has now been using CPAP for minimum of 6 months possibly 8 months.  With the left eye showing no significant progression and outer retinal disease  3.  Ophthalmic Meds Ordered this visit:  No orders of the defined types were placed in this encounter.      Return in about 6 months (around 10/29/2021) for OCT.  There are no Patient Instructions on file for this visit.   Explained the diagnoses, plan, and follow up with the patient and they expressed understanding.  Patient expressed understanding of the importance of proper follow up care.   Clent Demark Bijal Siglin M.D. Diseases & Surgery of the Retina and Vitreous Retina & Diabetic [place] 05/01/21     Abbreviations: M myopia (nearsighted); A astigmatism; H  hyperopia (farsighted); P presbyopia; Mrx spectacle prescription;  CTL contact lenses; OD right eye; OS left eye; OU both eyes  XT exotropia; ET esotropia; PEK punctate epithelial keratitis; PEE punctate epithelial erosions; DES dry eye syndrome; MGD meibomian gland dysfunction; ATs artificial tears; PFAT's preservative free artificial tears; [place] nuclear sclerotic cataract; PSC posterior subcapsular cataract; ERM epi-retinal membrane; PVD posterior vitreous detachment; RD retinal detachment; DM diabetes mellitus; DR diabetic retinopathy; NPDR non-proliferative diabetic retinopathy; PDR  proliferative diabetic retinopathy; CSME clinically significant macular edema; DME diabetic macular edema; dbh dot blot hemorrhages; CWS cotton wool spot; POAG primary open angle glaucoma; C/D cup-to-disc ratio; HVF humphrey visual field; GVF goldmann visual field; OCT optical coherence tomography; IOP intraocular pressure; BRVO Branch retinal vein occlusion; CRVO central retinal vein occlusion; CRAO central retinal artery occlusion; BRAO branch retinal artery occlusion; RT retinal tear; SB scleral buckle; PPV pars plana vitrectomy; VH Vitreous hemorrhage; PRP panretinal laser photocoagulation; IVK intravitreal kenalog; VMT vitreomacular traction; MH Macular hole;  NVD neovascularization of the disc; NVE neovascularization elsewhere; AREDS age related eye disease study; ARMD age related macular degeneration; POAG primary open angle glaucoma; EBMD epithelial/anterior basement membrane dystrophy; ACIOL anterior chamber intraocular lens; IOL intraocular lens; PCIOL posterior chamber intraocular lens; Phaco/IOL phacoemulsification with intraocular lens placement; [place] photorefractive keratectomy; LASIK laser assisted in situ keratomileusis; HTN hypertension; DM diabetes mellitus; COPD chronic obstructive pulmonary disease

## 2021-05-01 NOTE — Assessment & Plan Note (Addendum)
The nature of dry age related macular degeneration was discussed with the patient as well as its possible conversion to wet. The results of the AREDS 2 study was discussed with the patient. A diet rich in dark leafy green vegetables was advised and specific recommendations were made regarding supplements with AREDS 2 formulation . Control of hypertension and serum cholesterol may slow the disease. Smoking cessation is mandatory to slow the disease and diminish the risk of progressing to wet age related macular degeneration. The patient was instructed in the use of an [place] and was told to return immediately for any changes in the Grid. Stressed to the patient do not rub eyes   Subfoveal atrophy remain

## 2021-05-01 NOTE — Assessment & Plan Note (Signed)
Patient has now been on CPAP use continuous since approximately August 2022.  She reports no salient improvement in her sense of wellbeing or energy level and next day however.  Nonetheless reminded her of the importance of treating of on sleep apnea so as to minimize the risk of onset of stroke heart attack, and in my opinion progression of macular eye disease as the macular requires more oxygen than any other tissue in the body

## 2021-05-01 NOTE — Assessment & Plan Note (Signed)
No detectable diabetic retinopathy today 

## 2021-05-01 NOTE — Telephone Encounter (Signed)
Received a fax from [facility] stating that the patient declined to verify [facility]. [facility] can no longer communicate with the patient on our behalf.

## 2021-05-06 ENCOUNTER — Other Ambulatory Visit: Payer: Self-pay

## 2021-05-06 ENCOUNTER — Telehealth: Payer: Self-pay | Admitting: Cardiology

## 2021-05-06 NOTE — Telephone Encounter (Signed)
New Message:   Edrin from [employer]. She want to know if patient is still taking Nexlizet 180-10 mg?  She said they have been trying to contact patient, she does not answer. They need to deliver this medicine, if she is still taking it.

## 2021-05-06 NOTE — Telephone Encounter (Signed)
Called pharmacy and they are having trouble contacting the patient to let her know they have her prescription at the pharmacy and they asked [place] to reach out to the patient. I called patient and left a message for her to call back.

## 2021-05-07 NOTE — Telephone Encounter (Signed)
Pt states she is no longer taking Nexlizet. Message left for [place] with information. ?

## 2021-05-12 ENCOUNTER — Telehealth: Payer: Self-pay | Admitting: Pulmonary Disease

## 2021-05-12 MED ORDER — FLUTICASONE-SALMETEROL 250-50 MCG/ACT IN AEPB: 1.0000 | INHALATION_SPRAY | Freq: Two times a day (BID) | RESPIRATORY_TRACT | 3 refills | Status: DC

## 2021-05-12 NOTE — Telephone Encounter (Signed)
I called the patient and verified the pharmacy that the patient wanted the medication sent to  and she wanted it sent to [facility] on [address] and she voices understanding. Nothing further needed.  ?

## 2021-05-16 DIAGNOSIS — U071 COVID-19: Secondary | ICD-10-CM | POA: Diagnosis not present

## 2021-05-20 DIAGNOSIS — Z20828 Contact with and (suspected) exposure to other viral communicable diseases: Secondary | ICD-10-CM | POA: Diagnosis not present

## 2021-06-03 DIAGNOSIS — Z20822 Contact with and (suspected) exposure to covid-19: Secondary | ICD-10-CM | POA: Diagnosis not present

## 2021-06-06 ENCOUNTER — Other Ambulatory Visit: Payer: Self-pay | Admitting: Gastroenterology

## 2021-07-01 ENCOUNTER — Telehealth: Payer: Self-pay | Admitting: Cardiology

## 2021-07-01 NOTE — Telephone Encounter (Signed)
Sent message to Truddie Hidden to check for samples in [facility].  ?

## 2021-07-01 NOTE — Telephone Encounter (Signed)
Left VM for pt that we do not have any samples in either office. ?

## 2021-07-01 NOTE — Telephone Encounter (Signed)
Patient calling the office for samples of medication: ? ? ?1.  What medication and dosage are you requesting samples for? Nexlizet  ? ? ?2.  Are you currently out of this medication? Yes ? ?Patient states she is waiting for the rx to come from the mail order but it has not arrived yet  ?  ?

## 2021-07-05 DIAGNOSIS — R197 Diarrhea, unspecified: Secondary | ICD-10-CM | POA: Diagnosis not present

## 2021-07-05 DIAGNOSIS — E860 Dehydration: Secondary | ICD-10-CM | POA: Diagnosis not present

## 2021-07-05 DIAGNOSIS — A080 Rotaviral enteritis: Secondary | ICD-10-CM | POA: Diagnosis not present

## 2021-07-05 DIAGNOSIS — R112 Nausea with vomiting, unspecified: Secondary | ICD-10-CM | POA: Diagnosis not present

## 2021-07-05 DIAGNOSIS — J9811 Atelectasis: Secondary | ICD-10-CM | POA: Diagnosis not present

## 2021-07-09 DIAGNOSIS — R300 Dysuria: Secondary | ICD-10-CM | POA: Diagnosis not present

## 2021-07-09 DIAGNOSIS — R0989 Other specified symptoms and signs involving the circulatory and respiratory systems: Secondary | ICD-10-CM | POA: Diagnosis not present

## 2021-07-14 DIAGNOSIS — Z20822 Contact with and (suspected) exposure to covid-19: Secondary | ICD-10-CM | POA: Diagnosis not present

## 2021-07-29 DIAGNOSIS — B379 Candidiasis, unspecified: Secondary | ICD-10-CM | POA: Diagnosis not present

## 2021-07-29 DIAGNOSIS — K219 Gastro-esophageal reflux disease without esophagitis: Secondary | ICD-10-CM | POA: Diagnosis not present

## 2021-07-29 DIAGNOSIS — K5792 Diverticulitis of intestine, part unspecified, without perforation or abscess without bleeding: Secondary | ICD-10-CM | POA: Diagnosis not present

## 2021-07-29 DIAGNOSIS — Z6833 Body mass index (BMI) 33.0-33.9, adult: Secondary | ICD-10-CM | POA: Diagnosis not present

## 2021-08-13 ENCOUNTER — Other Ambulatory Visit: Payer: Self-pay

## 2021-08-14 ENCOUNTER — Ambulatory Visit (INDEPENDENT_AMBULATORY_CARE_PROVIDER_SITE_OTHER): Payer: Medicare Other | Admitting: Cardiology

## 2021-08-14 ENCOUNTER — Encounter: Payer: Self-pay | Admitting: Cardiology

## 2021-08-14 VITALS — BP 110/70 | HR 94 | Ht 65.0 in | Wt 206.0 lb

## 2021-08-14 DIAGNOSIS — E669 Obesity, unspecified: Secondary | ICD-10-CM

## 2021-08-14 DIAGNOSIS — I2510 Atherosclerotic heart disease of native coronary artery without angina pectoris: Secondary | ICD-10-CM | POA: Diagnosis not present

## 2021-08-14 DIAGNOSIS — I2584 Coronary atherosclerosis due to calcified coronary lesion: Secondary | ICD-10-CM | POA: Diagnosis not present

## 2021-08-14 DIAGNOSIS — I10 Essential (primary) hypertension: Secondary | ICD-10-CM | POA: Diagnosis not present

## 2021-08-14 DIAGNOSIS — E782 Mixed hyperlipidemia: Secondary | ICD-10-CM

## 2021-08-14 DIAGNOSIS — G4733 Obstructive sleep apnea (adult) (pediatric): Secondary | ICD-10-CM | POA: Diagnosis not present

## 2021-08-14 DIAGNOSIS — Z9989 Dependence on other enabling machines and devices: Secondary | ICD-10-CM

## 2021-08-14 NOTE — Progress Notes (Signed)
Cardiology Office Note:    Date:  08/14/2021   ID:  Heather Chavez, DOB Jul 06, 1945, MRN [id number]  PCP:  Ronita Hipps, MD  Cardiologist:  Jenean Lindau, MD   Referring MD: Ronita Hipps, MD    ASSESSMENT:    1. Coronary artery disease involving native coronary artery of native heart without angina pectoris   2. Coronary artery calcification   3. Essential hypertension   4. Obesity (BMI 35.0-39.9 without comorbidity)   5. OSA on CPAP   6. Mixed dyslipidemia    PLAN:    In order of problems listed above:  Coronary artery disease: Secondary prevention stressed with the patient.  Importance of compliance with diet medication stressed and she vocalized understanding.  She was advised to ambulate.  To the best of her ability. Essential hypertension: Blood pressure stable and diet was emphasized.  Lifestyle modification urged. Mixed dyslipidemia: Lipids are markedly elevated.  She has not tolerated statins in next visit and so I will refer her to our lipid clinic for the possibility of injectable lipid-lowering medications and she is agreeable. Obesity: Weight reduction stressed diet emphasized.  Risks of obesity explained and patient promises to do better with Patient will be seen in follow-up appointment in 9 months or earlier if the patient has any concerns.  She is fasting and will have complete blood work today.    Medication Adjustments/Labs and Tests Ordered: Current medicines are reviewed at length with the patient today.  Concerns regarding medicines are outlined above.  No orders of the defined types were placed in this encounter.  No orders of the defined types were placed in this encounter.    Chief Complaint  Patient presents with   Follow-up     History of Present Illness:    Heather Chavez is a 76 y.o. female.  Patient has past medical history of coronary artery calcification, essential hypertension, dyslipidemia and obesity.  Overall she  leads a sedentary lifestyle.  No chest pain orthopnea or PND.  She has sleep apnea.  She has tolerated statin and even Nexlizet.  She gives history of cramping.  Her LDL is markedly elevated at the last time it was checked it was 161.  At the time of my evaluation, the patient is alert awake oriented and in no distress.  Past Medical History:  Diagnosis Date   Advanced nonexudative age-related macular degeneration of right eye with subfoveal involvement 09/21/2019   Advanced nonexudative age-related macular degeneration of right eye without subfoveal involvement 09/21/2019   Allergic rhinitis    Angina pectoris ([place]) 05/17/2020   Bilateral breast cancer ([place]) 11/23/2013   BMI 34.0-34.9,adult 09/25/2019   Breast cancer (HCC)    Bronchitis, chronic (HCC)    CAD (coronary artery disease)    Cancer (HCC)    Cardiac murmur 05/17/2020   Coronary artery calcification 05/17/2020   Degenerative retinal drusen of right eye 09/21/2019   Diabetes mellitus due to underlying condition with unspecified complications ([place]) 17/00/1749   Diverticulosis    Dry eyes 06/25/2016   Dyspnea    Emphysema/COPD ([place])    Essential hypertension 05/17/2020   Estrogen receptor positive neoplasm 09/24/2015   Family history of malignant neoplasm of breast 11/23/2013   Family history of peritoneal cancer 11/23/2013   Fuchs' corneal dystrophy 11/03/2016   GERD (gastroesophageal reflux disease)    History of colon polyps    History of right breast cancer 09/19/2018   Hypertension    IBS (irritable bowel syndrome)  with D   Intermediate stage nonexudative age-related macular degeneration of left eye 09/21/2019   Iritis 11/03/2016   Macular degeneration    Major depressive disorder    Malignant neoplasm of upper-inner quadrant of left breast in female, estrogen receptor positive ([place]) 09/01/2016   Mixed dyslipidemia 05/17/2020   Obesity    Obesity (BMI 35.0-39.9 without comorbidity) 05/17/2020   OSA on CPAP  10/29/2020   Diagnosed some 3 months prior after pneumonia episode, Dr. Halford Chessman pulmonology   Personal history of chemotherapy    Personal history of radiation therapy    Pseudophakia 09/21/2019   Right-sided chest wall pain    SVT (supraventricular tachycardia) ([place]) 05/23/2020   Varicose veins of left lower extremity with complications 40/98/1191    Past Surgical History:  Procedure Laterality Date   BREAST BIOPSY     BREAST LUMPECTOMY     right brwast 2005/ left 2015   CATARACT EXTRACTION     CHOLECYSTECTOMY     COLONOSCOPY  04/17/2015   Colonic polyps status post polypectomy. Moderate to severe sigmoid diverticulosis   TUBAL LIGATION     VAGINAL HYSTERECTOMY     VEIN LIGATION AND STRIPPING      Current Medications: Current Meds  Medication Sig   albuterol (VENTOLIN HFA) 108 (90 Base) MCG/ACT inhaler Inhale 2 puffs into the lungs every 4 (four) hours as needed for wheezing.   baclofen (LIORESAL) 10 MG tablet Take 10 mg by mouth 3 (three) times daily.   Biotin 10000 MCG TABS Take 10,000 mcg by mouth daily.   fluticasone (FLONASE) 50 MCG/ACT nasal spray Place 1 spray into both nostrils daily.   fluticasone-salmeterol (WIXELA INHUB) 250-50 MCG/ACT AEPB Inhale 1 puff into the lungs in the morning and at bedtime.   furosemide (LASIX) 20 MG tablet Take 20 mg by mouth as needed for fluid or edema.   gabapentin (NEURONTIN) 400 MG capsule Take 400 mg by mouth 2 (two) times daily.   losartan-hydrochlorothiazide (HYZAAR) 50-12.5 MG tablet Take 1 tablet by mouth daily.   meloxicam (MOBIC) 15 MG tablet Take 15 mg by mouth daily.   metFORMIN (GLUCOPHAGE) 500 MG tablet Take 500 mg by mouth daily.   metoprolol succinate (TOPROL-XL) 25 MG 24 hr tablet Take 1 tablet (25 mg total) by mouth 2 (two) times daily.   montelukast (SINGULAIR) 10 MG tablet Take 10 mg by mouth at bedtime.   Multiple Vitamins-Minerals (OCUVITE-LUTEIN PO) Take 1 tablet by mouth daily.   nitroGLYCERIN (NITROSTAT) 0.4 MG SL  tablet Place 0.4 mg under the tongue every 5 (five) minutes as needed for chest pain.   omega-3 fish oil (MAXEPA) 1000 MG CAPS capsule Take 1,000 mg by mouth daily.   pantoprazole (PROTONIX) 40 MG tablet Take 1 tablet (40 mg total) by mouth daily. Call [phone] to schedule an office visit for more refills   potassium chloride (KLOR-CON) 10 MEQ tablet Take 10 mEq by mouth daily.   vitamin E 180 MG (400 UNITS) capsule Take 400 Units by mouth daily.   zinc gluconate 50 MG tablet Take 50 mg by mouth daily.     Allergies:   Codeine, Nexlizet [bempedoic acid-ezetimibe], Pitavastatin, Rosuvastatin, and Acetaminophen   Social History   Socioeconomic History   Marital status: Married    Spouse name: Not on file   Number of children: Not on file   Years of education: Not on file   Highest education level: Not on file  Occupational History   Not on file  Tobacco  Use   Smoking status: Former    Packs/day: 2.50    Years: 15.00    Total pack years: 37.50    Types: Cigarettes    Quit date: 03/09/1981    Years since quitting: 40.4   Smokeless tobacco: Never  Vaping Use   Vaping Use: Never used  Substance and Sexual Activity   Alcohol use: Yes   Drug use: No   Sexual activity: Not on file  Other Topics Concern   Not on file  Social History Narrative   Not on file   Social Determinants of Health   Financial Resource Strain: Not on file  Food Insecurity: Not on file  Transportation Needs: Not on file  Physical Activity: Not on file  Stress: Not on file  Social Connections: Not on file     Family History: The patient's family history includes Breast cancer in her mother and sister; Cancer in her paternal grandmother; Cancer (age of onset: 41) in her sister; Cancer (age of onset: 36) in her mother and paternal aunt; Cancer (age of onset: 68) in her father; Other in her mother.  ROS:   Please see the history of present illness.    All other systems reviewed and are  negative.  EKGs/Labs/Other Studies Reviewed:    The following studies were reviewed today: I discussed my findings with the patient at length.   Recent Labs: 02/10/2021: ALT 21; BUN 27; Creatinine, Ser 1.09; Potassium 4.1; Sodium 143  Recent Lipid Panel    Component Value Date/Time   CHOL 245 (H) 02/10/2021 1015   TRIG 183 (H) 02/10/2021 1015   HDL 51 02/10/2021 1015   CHOLHDL 4.8 (H) 02/10/2021 1015   LDLCALC 161 (H) 02/10/2021 1015    Physical Exam:    VS:  BP 110/70 (BP Location: Left Arm, Patient Position: Sitting, Cuff Size: Large)   Pulse 94   Ht '5\' 5"'$  (1.651 m)   Wt 206 lb (93.4 kg)   SpO2 96%   BMI 34.28 kg/m     Wt Readings from Last 3 Encounters:  08/14/21 206 lb (93.4 kg)  02/06/21 213 lb 12.8 oz (97 kg)  12/04/20 213 lb 6.4 oz (96.8 kg)     GEN: Patient is in no acute distress HEENT: Normal NECK: No JVD; No carotid bruits LYMPHATICS: No lymphadenopathy CARDIAC: Hear sounds regular, 2/6 systolic murmur at the apex. RESPIRATORY:  Clear to auscultation without rales, wheezing or rhonchi  ABDOMEN: Soft, non-tender, non-distended MUSCULOSKELETAL:  No edema; No deformity  SKIN: Warm and dry NEUROLOGIC:  Alert and oriented x 3 PSYCHIATRIC:  Normal affect   Signed, Jenean Lindau, MD  08/14/2021 9:16 AM    [place]

## 2021-08-14 NOTE — Patient Instructions (Signed)
Medication Instructions:  Your physician recommends that you continue on your current medications as directed. Please refer to the Current Medication list given to you today.  *If you need a refill on your cardiac medications before your next appointment, please call your pharmacy*   Lab Work: Your physician recommends that you return for lab work in:   Labs today: BMP, CBC, TSH, LFT, Lipids  If you have labs (blood work) drawn today and your tests are completely normal, you will receive your results only by: [place] (if you have MyChart) OR A paper copy in the mail If you have any lab test that is abnormal or we need to change your treatment, we will call you to review the results.   Testing/Procedures: None   Follow-Up: At [facility], you and your health needs are our priority.  As part of our continuing mission to provide you with exceptional heart care, we have created designated Provider Care Teams.  These Care Teams include your primary Cardiologist (physician) and Advanced Practice Providers (APPs -  Physician Assistants and Nurse Practitioners) who all work together to provide you with the care you need, when you need it.  We recommend signing up for the patient portal called "MyChart".  Sign up information is provided on this After Visit Summary.  MyChart is used to connect with patients for Virtual Visits (Telemedicine).  Patients are able to view lab/test results, encounter notes, upcoming appointments, etc.  Non-urgent messages can be sent to your provider as well.   To learn more about what you can do with MyChart, go to [email or web address].    Your next appointment:   1 year(s)  The format for your next appointment:   In Person  Provider:   Jyl Heinz, MD    Other Instructions None  Important Information About Sugar

## 2021-08-15 LAB — CBC
Hematocrit: 35.9 % (ref 34.0–46.6)
Hemoglobin: 12.1 g/dL (ref 11.1–15.9)
MCH: 29.6 pg (ref 26.6–33.0)
MCHC: 33.7 g/dL (ref 31.5–35.7)
MCV: 88 fL (ref 79–97)
Platelets: 239 10*3/uL (ref 150–450)
RBC: 4.09 x10E6/uL (ref 3.77–5.28)
RDW: 13.5 % (ref 11.7–15.4)
WBC: 7.3 10*3/uL (ref 3.4–10.8)

## 2021-08-15 LAB — LIPID PANEL
Chol/HDL Ratio: 4.8 ratio — ABNORMAL HIGH (ref 0.0–4.4)
Cholesterol, Total: 219 mg/dL — ABNORMAL HIGH (ref 100–199)
HDL: 46 mg/dL (ref >39)
LDL Chol Calc (NIH): 151 mg/dL — ABNORMAL HIGH (ref 0–99)
Triglycerides: 123 mg/dL (ref 0–149)
VLDL Cholesterol Cal: 22 mg/dL (ref 5–40)

## 2021-08-15 LAB — BASIC METABOLIC PANEL
BUN/Creatinine Ratio: 24 (ref 12–28)
BUN: 29 mg/dL — ABNORMAL HIGH (ref 8–27)
CO2: 27 mmol/L (ref 20–29)
Calcium: 9.9 mg/dL (ref 8.7–10.3)
Chloride: 103 mmol/L (ref 96–106)
Creatinine, Ser: 1.23 mg/dL — ABNORMAL HIGH (ref 0.57–1.00)
Glucose: 121 mg/dL — ABNORMAL HIGH (ref 70–99)
Potassium: 4.7 mmol/L (ref 3.5–5.2)
Sodium: 141 mmol/L (ref 134–144)
eGFR: 46 mL/min/{1.73_m2} — ABNORMAL LOW (ref >59)

## 2021-08-15 LAB — HEPATIC FUNCTION PANEL
ALT: 16 IU/L (ref 0–32)
AST: 23 IU/L (ref 0–40)
Albumin: 4.3 g/dL (ref 3.7–4.7)
Alkaline Phosphatase: 87 IU/L (ref 44–121)
Bilirubin Total: 0.3 mg/dL (ref 0.0–1.2)
Bilirubin, Direct: 0.11 mg/dL (ref 0.00–0.40)
Total Protein: 6.9 g/dL (ref 6.0–8.5)

## 2021-08-15 LAB — TSH: TSH: 3.45 u[IU]/mL (ref 0.450–4.500)

## 2021-08-29 ENCOUNTER — Ambulatory Visit: Payer: Medicare Other

## 2021-09-03 ENCOUNTER — Ambulatory Visit: Payer: Medicare Other

## 2021-09-25 ENCOUNTER — Ambulatory Visit
Admission: RE | Admit: 2021-09-25 | Discharge: 2021-09-25 | Disposition: A | Discharge status: Home / Self Care | Payer: Medicare Other | Source: Ambulatory Visit | Attending: Vascular Surgery | Admitting: Vascular Surgery

## 2021-09-25 ENCOUNTER — Ambulatory Visit: Payer: Medicare Other

## 2021-09-25 DIAGNOSIS — Z1231 Encounter for screening mammogram for malignant neoplasm of breast: Secondary | ICD-10-CM | POA: Diagnosis not present

## 2021-10-07 DIAGNOSIS — C50212 Malignant neoplasm of upper-inner quadrant of left female breast: Secondary | ICD-10-CM | POA: Diagnosis not present

## 2021-10-07 DIAGNOSIS — Z170 Estrogen receptor positive status [ER+]: Secondary | ICD-10-CM | POA: Diagnosis not present

## 2021-10-07 DIAGNOSIS — Z853 Personal history of malignant neoplasm of breast: Secondary | ICD-10-CM | POA: Diagnosis not present

## 2021-10-23 ENCOUNTER — Encounter (INDEPENDENT_AMBULATORY_CARE_PROVIDER_SITE_OTHER): Payer: Self-pay | Admitting: Ophthalmology

## 2021-10-23 ENCOUNTER — Ambulatory Visit (INDEPENDENT_AMBULATORY_CARE_PROVIDER_SITE_OTHER): Payer: Medicare Other | Admitting: Ophthalmology

## 2021-10-23 DIAGNOSIS — H353114 Nonexudative age-related macular degeneration, right eye, advanced atrophic with subfoveal involvement: Secondary | ICD-10-CM | POA: Diagnosis not present

## 2021-10-23 DIAGNOSIS — H353113 Nonexudative age-related macular degeneration, right eye, advanced atrophic without subfoveal involvement: Secondary | ICD-10-CM

## 2021-10-23 DIAGNOSIS — H353122 Nonexudative age-related macular degeneration, left eye, intermediate dry stage: Secondary | ICD-10-CM

## 2021-10-23 DIAGNOSIS — Z9989 Dependence on other enabling machines and devices: Secondary | ICD-10-CM

## 2021-10-23 DIAGNOSIS — I209 Angina pectoris, unspecified: Secondary | ICD-10-CM | POA: Diagnosis not present

## 2021-10-23 DIAGNOSIS — G4733 Obstructive sleep apnea (adult) (pediatric): Secondary | ICD-10-CM | POA: Diagnosis not present

## 2021-10-23 NOTE — Assessment & Plan Note (Signed)
Discontinued use some 2 months previous.

## 2021-10-23 NOTE — Assessment & Plan Note (Signed)
Outer retinal atrophy also developing in the region of previous subfoveal hyper reflective material from 3 years previous.  Patient had been on sleep apnea therapy CPAP until 2 months previous when other illnesses prompted her to not use it and she has yet to return to its use

## 2021-10-23 NOTE — Assessment & Plan Note (Signed)
Atrophy and loss of outer retinal layers in the fovea.  No signs of progression and no signs of CNVM formation

## 2021-10-23 NOTE — Progress Notes (Signed)
10/23/2021     CHIEF COMPLAINT Patient presents for  Chief Complaint  Patient presents with   Macular Degeneration      HISTORY OF PRESENT ILLNESS: Heather Chavez is a 76 y.o. female who presents to the clinic today for:   HPI   5 MOS FOR OCT, DILATE OU. Pt stated vision remained stable.   Last edited by Silvestre Moment on 10/23/2021  9:40 AM.      Referring physician: Ronita Hipps, MD [place] [id number],   HISTORICAL INFORMATION:   Selected notes from the MEDICAL RECORD NUMBER       CURRENT MEDICATIONS: No current outpatient medications on file. (Ophthalmic Drugs)   No current facility-administered medications for this visit. (Ophthalmic Drugs)   Current Outpatient Medications (Other)  Medication Sig   albuterol (VENTOLIN HFA) 108 (90 Base) MCG/ACT inhaler Inhale 2 puffs into the lungs every 4 (four) hours as needed for wheezing.   baclofen (LIORESAL) 10 MG tablet Take 10 mg by mouth 3 (three) times daily.   Biotin 10000 MCG TABS Take 10,000 mcg by mouth daily.   fluticasone (FLONASE) 50 MCG/ACT nasal spray Place 1 spray into both nostrils daily.   fluticasone-salmeterol (WIXELA INHUB) 250-50 MCG/ACT AEPB Inhale 1 puff into the lungs in the morning and at bedtime.   furosemide (LASIX) 20 MG tablet Take 20 mg by mouth as needed for fluid or edema.   gabapentin (NEURONTIN) 400 MG capsule Take 400 mg by mouth 2 (two) times daily.   losartan-hydrochlorothiazide (HYZAAR) 50-12.5 MG tablet Take 1 tablet by mouth daily.   meloxicam (MOBIC) 15 MG tablet Take 15 mg by mouth daily.   metFORMIN (GLUCOPHAGE) 500 MG tablet Take 500 mg by mouth daily.   metoprolol succinate (TOPROL-XL) 25 MG 24 hr tablet Take 1 tablet (25 mg total) by mouth 2 (two) times daily.   montelukast (SINGULAIR) 10 MG tablet Take 10 mg by mouth at bedtime.   Multiple Vitamins-Minerals (OCUVITE-LUTEIN PO) Take 1 tablet by mouth daily.   nitroGLYCERIN (NITROSTAT) 0.4 MG SL tablet  Place 0.4 mg under the tongue every 5 (five) minutes as needed for chest pain.   omega-3 fish oil (MAXEPA) 1000 MG CAPS capsule Take 1,000 mg by mouth daily.   pantoprazole (PROTONIX) 40 MG tablet Take 1 tablet (40 mg total) by mouth daily. Call [phone] to schedule an office visit for more refills   potassium chloride (KLOR-CON) 10 MEQ tablet Take 10 mEq by mouth daily.   vitamin E 180 MG (400 UNITS) capsule Take 400 Units by mouth daily.   zinc gluconate 50 MG tablet Take 50 mg by mouth daily.   No current facility-administered medications for this visit. (Other)      REVIEW OF SYSTEMS: ROS   Negative for: Constitutional, Gastrointestinal, Neurological, Skin, Genitourinary, Musculoskeletal, HENT, Endocrine, Cardiovascular, Eyes, Respiratory, Psychiatric, Allergic/Imm, Heme/Lymph Last edited by Silvestre Moment on 10/23/2021  9:40 AM.       ALLERGIES Allergies  Allergen Reactions   Codeine Other (See Comments) and Nausea And Vomiting    Other reaction(s): Other (see comments), Vomiting   Nexlizet [Bempedoic Acid-Ezetimibe] Other (See Comments)   Pitavastatin Other (See Comments)    Leg cramps   Rosuvastatin Other (See Comments)    Muscle pain   Acetaminophen Other (See Comments)    Oher reaction(s): Eye Redness, Other (see comments)     PAST MEDICAL HISTORY Past Medical History:  Diagnosis Date   Advanced nonexudative age-related macular degeneration  of right eye with subfoveal involvement 09/21/2019   Advanced nonexudative age-related macular degeneration of right eye without subfoveal involvement 09/21/2019   Allergic rhinitis    Angina pectoris ([place]) 05/17/2020   Bilateral breast cancer ([place]) 11/23/2013   BMI 34.0-34.9,adult 09/25/2019   Breast cancer (HCC)    Bronchitis, chronic (HCC)    CAD (coronary artery disease)    Cancer (HCC)    Cardiac murmur 05/17/2020   Coronary artery calcification 05/17/2020   Degenerative retinal drusen of right eye 09/21/2019   Diabetes  mellitus due to underlying condition with unspecified complications ([place]) 16/12/9602   Diverticulosis    Dry eyes 06/25/2016   Dyspnea    Emphysema/COPD ([place])    Essential hypertension 05/17/2020   Estrogen receptor positive neoplasm 09/24/2015   Family history of malignant neoplasm of breast 11/23/2013   Family history of peritoneal cancer 11/23/2013   Fuchs' corneal dystrophy 11/03/2016   GERD (gastroesophageal reflux disease)    History of colon polyps    History of right breast cancer 09/19/2018   Hypertension    IBS (irritable bowel syndrome)    with D   Intermediate stage nonexudative age-related macular degeneration of left eye 09/21/2019   Iritis 11/03/2016   Macular degeneration    Major depressive disorder    Malignant neoplasm of upper-inner quadrant of left breast in female, estrogen receptor positive ([place]) 09/01/2016   Mixed dyslipidemia 05/17/2020   Obesity    Obesity (BMI 35.0-39.9 without comorbidity) 05/17/2020   OSA on CPAP 10/29/2020   Diagnosed some 3 months prior after pneumonia episode, Dr. Halford Chessman pulmonology   Personal history of chemotherapy    Personal history of radiation therapy    Pseudophakia 09/21/2019   Right-sided chest wall pain    SVT (supraventricular tachycardia) ([place]) 05/23/2020   Varicose veins of left lower extremity with complications 54/11/8117   Past Surgical History:  Procedure Laterality Date   BREAST BIOPSY     BREAST LUMPECTOMY     right brwast 2005/ left 2015   CATARACT EXTRACTION     CHOLECYSTECTOMY     COLONOSCOPY  04/17/2015   Colonic polyps status post polypectomy. Moderate to severe sigmoid diverticulosis   TUBAL LIGATION     VAGINAL HYSTERECTOMY     VEIN LIGATION AND STRIPPING      FAMILY HISTORY Family History  Problem Relation Age of Onset   Cancer Mother 53       bilateral breast cancer ages 21 then 71. Peritoneal cancer at age 76.   Other Mother        Ms. Beighley's mother was adopted so have no information  regarding maternal family  history other than her mother   Breast cancer Mother    Cancer Father 56       renal   Cancer Sister 53       breast   Breast cancer Sister    Breast cancer Daughter    Breast cancer Maternal Aunt    Cancer Paternal Aunt 49       breast   Cancer Paternal Grandmother        bilateral breast cancer at unknown age    SOCIAL HISTORY Social History   Tobacco Use   Smoking status: Former    Packs/day: 2.50    Years: 15.00    Total pack years: 37.50    Types: Cigarettes    Quit date: 03/09/1981    Years since quitting: 40.6   Smokeless tobacco: Never  Vaping Use  Vaping Use: Never used  Substance Use Topics   Alcohol use: Yes   Drug use: No         OPHTHALMIC EXAM:  Base Eye Exam     Visual Acuity (ETDRS)       Right Left   Dist cc 20/60 -2 20/30 -1 +1   Dist ph cc 20/50 -1 NI    Correction: Glasses         Tonometry (Tonopen, 9:47 AM)       Right Left   Pressure 15 15         Pupils       Pupils APD   Right PERRL None   Left PERRL None         Visual Fields       Left Right    Full Full         Extraocular Movement       Right Left    Full, Ortho Full, Ortho         Neuro/Psych     Oriented x3: Yes   Mood/Affect: Normal         Dilation     Both eyes: 1.0% Mydriacyl, 2.5% Phenylephrine @ 9:46 AM           Slit Lamp and Fundus Exam     External Exam       Right Left   External Normal Normal         Slit Lamp Exam       Right Left   Lids/Lashes Normal Normal   Conjunctiva/Sclera White and quiet White and quiet   Cornea Clear Clear   Anterior Chamber Deep and quiet Deep and quiet   Iris Round and reactive Round and reactive   Lens Posterior chamber intraocular lens Posterior chamber intraocular lens   Anterior Vitreous Normal Normal         Fundus Exam       Right Left   Posterior Vitreous Posterior vitreous detachment Posterior vitreous detachment   Disc Normal Normal    C/D Ratio 0.15 0.05   Macula Geographic atrophy, in the faz, no macular thickening Retinal pigment epithelial mottling, Hard drusen, no macular thickening, no exudates, Geographic atrophy subfoveal   Vessels Normal Normal   Periphery Normal Normal            IMAGING AND PROCEDURES  Imaging and Procedures for 10/23/21  OCT, Retina - OU - Both Eyes       Right Eye Quality was good. Scan locations included subfoveal. Central Foveal Thickness: 192. Progression has been stable. Findings include abnormal foveal contour, subretinal scarring, central retinal atrophy.   Left Eye Quality was good. Scan locations included subfoveal. Central Foveal Thickness: 260. Progression has been stable. Findings include abnormal foveal contour, subretinal scarring, central retinal atrophy.   Notes Central outer atrophy progression over time with encroachment into the FAZ center OD, loss of the EZ and photoreceptor layer OU worse OD but also much less drusen deposition now that patient had been on CPAP therapy for sleep apnea now off for 2 months.  Now with less subretinal hyper reflective Outer retinal material, OS               ASSESSMENT/PLAN:  Advanced nonexudative age-related macular degeneration of right eye with subfoveal involvement Atrophy and loss of outer retinal layers in the fovea.  No signs of progression and no signs of CNVM formation  Intermediate stage nonexudative age-related macular degeneration of left  eye Outer retinal atrophy also developing in the region of previous subfoveal hyper reflective material from 3 years previous.  Patient had been on sleep apnea therapy CPAP until 2 months previous when other illnesses prompted her to not use it and she has yet to return to its use  OSA on CPAP Discontinued use some 2 months previous.     ICD-10-CM   1. Advanced nonexudative age-related macular degeneration of right eye without subfoveal involvement  H35.3113 OCT, Retina -  OU - Both Eyes    2. Advanced nonexudative age-related macular degeneration of right eye with subfoveal involvement  H35.3114     3. Intermediate stage nonexudative age-related macular degeneration of left eye  H35.3122     4. OSA on CPAP  G47.33    Z99.89       1.  OU with dry atrophic ARMD.  They be a candidate for Syfovre once the recent spate of problems with retinal vasculitis and occlusive vasculitis are studied and understood better.    2.  No sign of CNVM OU  3.  ENCourage the use of CPAP if OSA present  Ophthalmic Meds Ordered this visit:  No orders of the defined types were placed in this encounter.      Return in about 6 months (around 04/25/2022) for DILATE OU, OCT, COLOR FP.  There are no Patient Instructions on file for this visit.   Explained the diagnoses, plan, and follow up with the patient and they expressed understanding.  Patient expressed understanding of the importance of proper follow up care.   Clent Demark Kimmie Berggren M.D. Diseases & Surgery of the Retina and Vitreous [facility]ina [facility] Diabetic [place] 10/23/21     Abbreviations: M myopia (nearsighted); A astigmatism; H hyperopia (farsighted); P presbyopia; Mrx spectacle prescription;  CTL contact lenses; OD right eye; OS left eye; OU both eyes  XT exotropia; ET esotropia; PEK punctate epithelial keratitis; PEE punctate epithelial erosions; DES dry eye syndrome; MGD meibomian gland dysfunction; ATs artificial tears; PFAT's preservative free artificial tears; [place] nuclear sclerotic cataract; PSC posterior subcapsular cataract; ERM epi-retinal membrane; PVD posterior vitreous detachment; RD retinal detachment; DM diabetes mellitus; DR diabetic retinopathy; NPDR non-proliferative diabetic retinopathy; PDR proliferative diabetic retinopathy; CSME clinically significant macular edema; DME diabetic macular edema; dbh dot blot hemorrhages; CWS cotton wool spot; POAG primary open angle glaucoma; C/D cup-to-disc ratio; HVF  humphrey visual field; GVF goldmann visual field; OCT optical coherence tomography; IOP intraocular pressure; BRVO Branch retinal vein occlusion; CRVO central retinal vein occlusion; CRAO central retinal artery occlusion; BRAO branch retinal artery occlusion; RT retinal tear; SB scleral buckle; PPV pars plana vitrectomy; VH Vitreous hemorrhage; PRP panretinal laser photocoagulation; IVK intravitreal kenalog; VMT vitreomacular traction; MH Macular hole;  NVD neovascularization of the disc; NVE neovascularization elsewhere; AREDS age related eye disease study; ARMD age related macular degeneration; POAG primary open angle glaucoma; EBMD epithelial/anterior basement membrane dystrophy; ACIOL anterior chamber intraocular lens; IOL intraocular lens; PCIOL posterior chamber intraocular lens; Phaco/IOL phacoemulsification with intraocular lens placement; [place] photorefractive keratectomy; LASIK laser assisted in situ keratomileusis; HTN hypertension; DM diabetes mellitus; COPD chronic obstructive pulmonary disease

## 2021-10-30 ENCOUNTER — Encounter (INDEPENDENT_AMBULATORY_CARE_PROVIDER_SITE_OTHER): Payer: Medicare Other | Admitting: Ophthalmology

## 2021-11-12 DIAGNOSIS — H04123 Dry eye syndrome of bilateral lacrimal glands: Secondary | ICD-10-CM | POA: Diagnosis not present

## 2021-11-12 DIAGNOSIS — H2181 Floppy iris syndrome: Secondary | ICD-10-CM | POA: Diagnosis not present

## 2021-11-12 DIAGNOSIS — Z961 Presence of intraocular lens: Secondary | ICD-10-CM | POA: Diagnosis not present

## 2021-11-12 DIAGNOSIS — H353134 Nonexudative age-related macular degeneration, bilateral, advanced atrophic with subfoveal involvement: Secondary | ICD-10-CM | POA: Diagnosis not present

## 2021-11-12 DIAGNOSIS — E119 Type 2 diabetes mellitus without complications: Secondary | ICD-10-CM | POA: Diagnosis not present

## 2021-11-27 DIAGNOSIS — Z0000 Encounter for general adult medical examination without abnormal findings: Secondary | ICD-10-CM | POA: Diagnosis not present

## 2021-11-27 DIAGNOSIS — E7800 Pure hypercholesterolemia, unspecified: Secondary | ICD-10-CM | POA: Diagnosis not present

## 2021-11-27 DIAGNOSIS — E1169 Type 2 diabetes mellitus with other specified complication: Secondary | ICD-10-CM | POA: Diagnosis not present

## 2021-11-27 DIAGNOSIS — Z6834 Body mass index (BMI) 34.0-34.9, adult: Secondary | ICD-10-CM | POA: Diagnosis not present

## 2021-11-27 DIAGNOSIS — M7989 Other specified soft tissue disorders: Secondary | ICD-10-CM | POA: Diagnosis not present

## 2021-12-02 ENCOUNTER — Ambulatory Visit (INDEPENDENT_AMBULATORY_CARE_PROVIDER_SITE_OTHER): Payer: Medicare Other | Admitting: Pulmonary Disease

## 2021-12-02 ENCOUNTER — Encounter: Payer: Self-pay | Admitting: Pulmonary Disease

## 2021-12-02 VITALS — BP 110/60 | HR 94 | Ht 65.0 in | Wt 216.6 lb

## 2021-12-02 DIAGNOSIS — J301 Allergic rhinitis due to pollen: Secondary | ICD-10-CM | POA: Diagnosis not present

## 2021-12-02 DIAGNOSIS — I209 Angina pectoris, unspecified: Secondary | ICD-10-CM

## 2021-12-02 DIAGNOSIS — R0602 Shortness of breath: Secondary | ICD-10-CM | POA: Diagnosis not present

## 2021-12-02 DIAGNOSIS — J4540 Moderate persistent asthma, uncomplicated: Secondary | ICD-10-CM

## 2021-12-02 DIAGNOSIS — J432 Centrilobular emphysema: Secondary | ICD-10-CM

## 2021-12-02 DIAGNOSIS — G4733 Obstructive sleep apnea (adult) (pediatric): Secondary | ICD-10-CM | POA: Diagnosis not present

## 2021-12-02 MED ORDER — FLUTICASONE-SALMETEROL 250-50 MCG/ACT IN AEPB: 1.0000 | INHALATION_SPRAY | Freq: Two times a day (BID) | RESPIRATORY_TRACT | 3 refills | Status: DC

## 2021-12-02 NOTE — Progress Notes (Signed)
[facility]  Chief Complaint  Patient presents with   Follow-up    CPAP supplies. Meds renewal     Constitutional:  BP 110/60 (BP Location: Left Arm)   Pulse 94   Ht '5\' 5"'$  (1.651 m)   Wt 216 lb 9.6 oz (98.2 kg)   SpO2 95%   BMI 36.04 kg/m   Past Medical History:  Breast cancer, Depression, GERD, HTN  Past Surgical History:  She  has a past surgical history that includes Breast lumpectomy; Breast biopsy; Cataract extraction; Vein ligation and stripping; Tubal ligation; Vaginal hysterectomy; Cholecystectomy; and Colonoscopy (04/17/2015).  Brief Summary:  Heather Chavez is a 76 y.o. female former smoker with emphysema, asthma, and obstructive sleep apnea.      Subjective:   She had a flare up of her asthma earlier this month.  Was having sinus congestion and drainage.  Then developed cough and chest congestion.  Got doxycycline from her PCP.  Feels better.  She has only been using wixela once per day.  Using CPAP.  Mask puts pressure on her nose.  CPAP setting feels comfortable.  She does get winded more easily walking up stairs.  Physical Exam:   Appearance - well kempt   ENMT - no sinus tenderness, no oral exudate, no LAN, Mallampati 3 airway, no stridor  Respiratory - equal breath sounds bilaterally, no wheezing or rales  CV - s1s2 regular rate and rhythm, no murmurs  Ext - no clubbing, no edema  Skin - no rashes  Psych - normal mood and affect      Pulmonary testing:  PFT 06/20/20 >> FEV1 1.72 (75%), FEV1% 84, TLC 4.05 (77%), DLCO 62%  Chest Imaging:  CT chest 06/01/18 >> coronary calcification, emphysema, scar with volume loss RML and RLL CT chest 05/20/20 >> scarring in RML and lingula Sniff test 07/01/20 >> elevated Rt diaphragm, but normal movement   Sleep Tests:  HST 06/21/20 >> AHI 21.4, SpO2 71% Auto CPAP 11/13/21 to 11/30/21 >> used on 16 of 18 nights with average 6 hrs 55 min.  Average AHI 3.3  with median CPAP 11 and 95 th percentile CPAP 14 cm H2O  Cardiac Tests:  Echo 05/21/20 >> EF greater than 70%  Social History:  She  reports that she quit smoking about 40 years ago. Her smoking use included cigarettes. She has a 37.50 pack-year smoking history. She has never used smokeless tobacco. She reports current alcohol use. She reports that she does not use drugs.  Family History:  Her family history includes Breast cancer in her daughter, maternal aunt, mother, and sister; Cancer in her paternal grandmother; Cancer (age of onset: 43) in her sister; Cancer (age of onset: 10) in her mother and paternal aunt; Cancer (age of onset: 27) in her father; Other in her mother.     Assessment/Plan:   Chronic cough from emphysema, asthma, and allergic rhinitis. - she wasn't able to tolerate symbicort before due to palpitations  - trelegy and incruse caused throat irritation - advised her to try using wixela bid - continue singulair - prn albuterol  Perennial allergic rhinitis. - continue singulair, flonase, allegra  Dyspnea on exertion. - likely from deconditioning - will see if increasing wixela to bid helps - might need follow up with cardiology if this persists  Obstructive sleep apnea. - she is compliant with CPAP and reports benefit from therapy - she uses [facility] for her DME - will arrange  for refitting of her mask - continue auto CPAP 5 to 15 cm H2O  Obesity. - discussed importance of weight loss, particularly in relation to sleep apnea  Supraventricular tachycardia. - followed by Dr. Geraldo Pitter with Cardiology  Time Spent Involved in Patient Care on Day of Examination:  36 minutes  Follow up:   Patient Instructions  Try using wixela in the morning and in the evening.  Will have [facility] arrange for new CPAP supplies and refit your CPAP mask.  Follow up in 6 months.  Medication List:   Allergies as of 12/02/2021       Reactions    Codeine Other (See Comments), Nausea And Vomiting   Other reaction(s): Other (see comments), Vomiting   Nexlizet [bempedoic Acid-ezetimibe] Other (See Comments)   Pitavastatin Other (See Comments)   Leg cramps   Rosuvastatin Other (See Comments)   Muscle pain   Acetaminophen Other (See Comments)   Oher reaction(s): Eye Redness, Other (see comments)        Medication List        Accurate as of December 02, 2021  3:55 PM. If you have any questions, ask your nurse or doctor.          albuterol 108 (90 Base) MCG/ACT inhaler Commonly known as: VENTOLIN HFA Inhale 2 puffs into the lungs every 4 (four) hours as needed for wheezing.   baclofen 10 MG tablet Commonly known as: LIORESAL Take 10 mg by mouth 3 (three) times daily.   Biotin 10000 MCG Tabs Take 10,000 mcg by mouth daily.   fexofenadine 180 MG tablet Commonly known as: ALLEGRA Take 180 mg by mouth.   fluticasone 50 MCG/ACT nasal spray Commonly known as: FLONASE Place 1 spray into both nostrils daily.   fluticasone-salmeterol 250-50 MCG/ACT Aepb Commonly known as: Wixela Inhub Inhale 1 puff into the lungs in the morning and at bedtime.   furosemide 20 MG tablet Commonly known as: LASIX Take 20 mg by mouth as needed for fluid or edema.   gabapentin 400 MG capsule Commonly known as: NEURONTIN Take 400 mg by mouth 2 (two) times daily.   losartan-hydrochlorothiazide 50-12.5 MG tablet Commonly known as: HYZAAR Take 1 tablet by mouth daily.   meloxicam 15 MG tablet Commonly known as: MOBIC Take 15 mg by mouth daily.   metFORMIN 500 MG tablet Commonly known as: GLUCOPHAGE Take 500 mg by mouth daily.   metoprolol succinate 25 MG 24 hr tablet Commonly known as: TOPROL-XL Take 1 tablet (25 mg total) by mouth 2 (two) times daily.   montelukast 10 MG tablet Commonly known as: SINGULAIR Take 10 mg by mouth at bedtime.   nitroGLYCERIN 0.4 MG SL tablet Commonly known as: NITROSTAT Place 0.4 mg under the  tongue every 5 (five) minutes as needed for chest pain.   OCUVITE-LUTEIN PO Take 1 tablet by mouth daily.   omega-3 fish oil 1000 MG Caps capsule Commonly known as: MAXEPA Take 1,000 mg by mouth daily.   pantoprazole 40 MG tablet Commonly known as: PROTONIX Take 1 tablet (40 mg total) by mouth daily. Call [phone] to schedule an office visit for more refills   potassium chloride 10 MEQ tablet Commonly known as: KLOR-CON Take 10 mEq by mouth daily.   vitamin E 180 MG (400 UNITS) capsule Take 400 Units by mouth daily.   zinc gluconate 50 MG tablet Take 50 mg by mouth daily.        Signature:  Chesley Mires, MD [facility]  Pager - [phone] 12/02/2021, 3:55 PM

## 2021-12-02 NOTE — Patient Instructions (Signed)
Try using wixela in the morning and in the evening.  Will have [facility] arrange for new CPAP supplies and refit your CPAP mask.  Follow up in 6 months.

## 2021-12-30 ENCOUNTER — Ambulatory Visit: Payer: Medicare Other | Admitting: Pulmonary Disease

## 2022-01-07 ENCOUNTER — Other Ambulatory Visit: Payer: Self-pay | Admitting: Cardiology

## 2022-01-07 DIAGNOSIS — I209 Angina pectoris, unspecified: Secondary | ICD-10-CM

## 2022-01-20 DIAGNOSIS — Z23 Encounter for immunization: Secondary | ICD-10-CM | POA: Diagnosis not present

## 2022-02-23 DIAGNOSIS — J42 Unspecified chronic bronchitis: Secondary | ICD-10-CM | POA: Diagnosis not present

## 2022-03-12 DIAGNOSIS — J439 Emphysema, unspecified: Secondary | ICD-10-CM | POA: Diagnosis not present

## 2022-03-12 DIAGNOSIS — J329 Chronic sinusitis, unspecified: Secondary | ICD-10-CM | POA: Diagnosis not present

## 2022-03-12 DIAGNOSIS — J40 Bronchitis, not specified as acute or chronic: Secondary | ICD-10-CM | POA: Diagnosis not present

## 2022-03-12 DIAGNOSIS — Z6834 Body mass index (BMI) 34.0-34.9, adult: Secondary | ICD-10-CM | POA: Diagnosis not present

## 2022-04-07 DIAGNOSIS — J40 Bronchitis, not specified as acute or chronic: Secondary | ICD-10-CM | POA: Diagnosis not present

## 2022-04-07 DIAGNOSIS — J329 Chronic sinusitis, unspecified: Secondary | ICD-10-CM | POA: Diagnosis not present

## 2022-04-27 ENCOUNTER — Encounter (INDEPENDENT_AMBULATORY_CARE_PROVIDER_SITE_OTHER): Payer: Medicare Other | Admitting: Ophthalmology

## 2022-05-01 DIAGNOSIS — Z8616 Personal history of COVID-19: Secondary | ICD-10-CM | POA: Diagnosis not present

## 2022-05-01 DIAGNOSIS — J22 Unspecified acute lower respiratory infection: Secondary | ICD-10-CM | POA: Diagnosis not present

## 2022-05-01 DIAGNOSIS — R509 Fever, unspecified: Secondary | ICD-10-CM | POA: Diagnosis not present

## 2022-05-04 DIAGNOSIS — J329 Chronic sinusitis, unspecified: Secondary | ICD-10-CM | POA: Diagnosis not present

## 2022-05-04 DIAGNOSIS — J40 Bronchitis, not specified as acute or chronic: Secondary | ICD-10-CM | POA: Diagnosis not present

## 2022-05-19 ENCOUNTER — Other Ambulatory Visit: Payer: Self-pay

## 2022-05-19 DIAGNOSIS — I209 Angina pectoris, unspecified: Secondary | ICD-10-CM

## 2022-05-19 MED ORDER — METOPROLOL SUCCINATE ER 25 MG PO TB24: 25.0000 mg | ORAL_TABLET | Freq: Two times a day (BID) | ORAL | 0 refills | Status: DC

## 2022-05-20 ENCOUNTER — Other Ambulatory Visit: Payer: Self-pay

## 2022-05-20 DIAGNOSIS — I209 Angina pectoris, unspecified: Secondary | ICD-10-CM

## 2022-05-20 MED ORDER — METOPROLOL SUCCINATE ER 25 MG PO TB24: 25.0000 mg | ORAL_TABLET | Freq: Two times a day (BID) | ORAL | 0 refills | Status: DC

## 2022-06-22 DIAGNOSIS — Z6833 Body mass index (BMI) 33.0-33.9, adult: Secondary | ICD-10-CM | POA: Diagnosis not present

## 2022-06-22 DIAGNOSIS — E790 Hyperuricemia without signs of inflammatory arthritis and tophaceous disease: Secondary | ICD-10-CM | POA: Diagnosis not present

## 2022-06-22 DIAGNOSIS — E7800 Pure hypercholesterolemia, unspecified: Secondary | ICD-10-CM | POA: Diagnosis not present

## 2022-06-22 DIAGNOSIS — E1169 Type 2 diabetes mellitus with other specified complication: Secondary | ICD-10-CM | POA: Diagnosis not present

## 2022-06-24 ENCOUNTER — Other Ambulatory Visit: Payer: Self-pay | Admitting: Vascular Surgery

## 2022-06-24 DIAGNOSIS — Z1231 Encounter for screening mammogram for malignant neoplasm of breast: Secondary | ICD-10-CM

## 2022-07-08 ENCOUNTER — Other Ambulatory Visit: Payer: Self-pay | Admitting: *Deleted

## 2022-07-08 DIAGNOSIS — I83892 Varicose veins of left lower extremities with other complications: Secondary | ICD-10-CM

## 2022-07-13 ENCOUNTER — Ambulatory Visit (HOSPITAL_COMMUNITY): Payer: Medicare Other

## 2022-07-15 ENCOUNTER — Encounter: Payer: Medicare Other | Admitting: Vascular Surgery

## 2022-08-08 ENCOUNTER — Other Ambulatory Visit: Payer: Self-pay | Admitting: Pulmonary Disease

## 2022-08-18 ENCOUNTER — Ambulatory Visit (INDEPENDENT_AMBULATORY_CARE_PROVIDER_SITE_OTHER): Payer: Medicare Other | Admitting: Vascular Surgery

## 2022-08-18 ENCOUNTER — Ambulatory Visit (HOSPITAL_COMMUNITY)
Admission: RE | Admit: 2022-08-18 | Discharge: 2022-08-18 | Disposition: A | Discharge status: Home / Self Care | Payer: Medicare Other | Source: Ambulatory Visit | Attending: Vascular Surgery | Admitting: Vascular Surgery

## 2022-08-18 ENCOUNTER — Encounter: Payer: Self-pay | Admitting: Vascular Surgery

## 2022-08-18 VITALS — BP 114/77 | HR 100 | Temp 98.0°F | Resp 14 | Ht 65.0 in | Wt 206.0 lb

## 2022-08-18 DIAGNOSIS — I83891 Varicose veins of right lower extremities with other complications: Secondary | ICD-10-CM | POA: Diagnosis not present

## 2022-08-18 DIAGNOSIS — I872 Venous insufficiency (chronic) (peripheral): Secondary | ICD-10-CM

## 2022-08-18 DIAGNOSIS — I83892 Varicose veins of left lower extremities with other complications: Secondary | ICD-10-CM | POA: Diagnosis not present

## 2022-08-18 NOTE — Progress Notes (Signed)
Patient name: Heather Chavez MRN: [id number] DOB: 08-21-1945 Sex: female  REASON FOR CONSULT: Varicose veins with swelling  HPI: Heather Chavez is a 77 y.o. female, with history of breast cancer, coronary artery disease, diabetes, hypertension that presents for evaluation of varicose veins with leg swelling.  States she previously had a stripping of her left leg great saphenous vein years ago.  She has had swelling in the contralateral right leg for years that has been manageable.  Over the last several months she feels like the swelling has gotten worse and is not controlled with her compression stocking.  She previously saw Dr. Hart Rochester on 11/17/2016 for evaluation of her venous insufficiency.  Recommended laser ablation.  She states she never got this scheduled due to taking care of her son-in-law who had a stroke.  Past Medical History:  Diagnosis Date   Advanced nonexudative age-related macular degeneration of right eye with subfoveal involvement 09/21/2019   Advanced nonexudative age-related macular degeneration of right eye without subfoveal involvement 09/21/2019   Allergic rhinitis    Angina pectoris (HCC) 05/17/2020   Bilateral breast cancer (HCC) 11/23/2013   BMI 34.0-34.9,adult 09/25/2019   Breast cancer (HCC)    Bronchitis, chronic (HCC)    CAD (coronary artery disease)    Cancer (HCC)    Cardiac murmur 05/17/2020   Coronary artery calcification 05/17/2020   Degenerative retinal drusen of right eye 09/21/2019   Diabetes mellitus due to underlying condition with unspecified complications (HCC) 05/17/2020   Diverticulosis    Dry eyes 06/25/2016   Dyspnea    Emphysema/COPD (HCC)    Essential hypertension 05/17/2020   Estrogen receptor positive neoplasm 09/24/2015   Family history of malignant neoplasm of breast 11/23/2013   Family history of peritoneal cancer 11/23/2013   Fuchs' corneal dystrophy 11/03/2016   GERD (gastroesophageal reflux disease)    History  of colon polyps    History of right breast cancer 09/19/2018   Hypertension    IBS (irritable bowel syndrome)    with D   Intermediate stage nonexudative age-related macular degeneration of left eye 09/21/2019   Iritis 11/03/2016   Macular degeneration    Major depressive disorder    Malignant neoplasm of upper-inner quadrant of left breast in female, estrogen receptor positive (HCC) 09/01/2016   Mixed dyslipidemia 05/17/2020   Obesity    Obesity (BMI 35.0-39.9 without comorbidity) 05/17/2020   OSA on CPAP 10/29/2020   Diagnosed some 3 months prior after pneumonia episode, Dr. Craige Cotta pulmonology   Personal history of chemotherapy    Personal history of radiation therapy    Pseudophakia 09/21/2019   Right-sided chest wall pain    SVT (supraventricular tachycardia) (HCC) 05/23/2020   Varicose veins of left lower extremity with complications 08/11/2016    Past Surgical History:  Procedure Laterality Date   BREAST BIOPSY     BREAST LUMPECTOMY     right brwast 2005/ left 2015   CATARACT EXTRACTION     CHOLECYSTECTOMY     COLONOSCOPY  04/17/2015   Colonic polyps status post polypectomy. Moderate to severe sigmoid diverticulosis   TUBAL LIGATION     VAGINAL HYSTERECTOMY     VEIN LIGATION AND STRIPPING      Family History  Problem Relation Age of Onset   Cancer Mother 86       bilateral breast cancer ages 90 then 77. Peritoneal cancer at age 79.   Other Mother        Ms. Dickman's mother was adopted  so have no information regarding maternal family  history other than her mother   Breast cancer Mother    Cancer Father 72       renal   Cancer Sister 82       breast   Breast cancer Sister    Breast cancer Daughter    Breast cancer Maternal Aunt    Cancer Paternal Aunt 40       breast   Cancer Paternal Grandmother        bilateral breast cancer at unknown age    SOCIAL HISTORY: Social History   Socioeconomic History   Marital status: Married    Spouse name: Not on file    Number of children: Not on file   Years of education: Not on file   Highest education level: Not on file  Occupational History   Not on file  Tobacco Use   Smoking status: Former    Packs/day: 2.50    Years: 15.00    Additional pack years: 0.00    Total pack years: 37.50    Types: Cigarettes    Quit date: 03/09/1981    Years since quitting: 41.4   Smokeless tobacco: Never  Vaping Use   Vaping Use: Never used  Substance and Sexual Activity   Alcohol use: Yes   Drug use: No   Sexual activity: Not on file  Other Topics Concern   Not on file  Social History Narrative   Not on file   Social Determinants of Health   Financial Resource Strain: Not on file  Food Insecurity: Not on file  Transportation Needs: Not on file  Physical Activity: Not on file  Stress: Not on file  Social Connections: Not on file  Intimate Partner Violence: Not on file    Allergies  Allergen Reactions   Codeine Other (See Comments) and Nausea And Vomiting    Other reaction(s): Other (see comments), Vomiting   Nexlizet [Bempedoic Acid-Ezetimibe] Other (See Comments)   Pitavastatin Other (See Comments)    Leg cramps   Rosuvastatin Other (See Comments)    Muscle pain   Acetaminophen Other (See Comments)    Oher reaction(s): Eye Redness, Other (see comments)     Current Outpatient Medications  Medication Sig Dispense Refill   albuterol (VENTOLIN HFA) 108 (90 Base) MCG/ACT inhaler Inhale 2 puffs into the lungs every 4 (four) hours as needed for wheezing.     baclofen (LIORESAL) 10 MG tablet Take 10 mg by mouth 3 (three) times daily.     Biotin 16109 MCG TABS Take 10,000 mcg by mouth daily.     fexofenadine (ALLEGRA) 180 MG tablet Take 180 mg by mouth.     fluticasone (FLONASE) 50 MCG/ACT nasal spray Place 1 spray into both nostrils daily.     furosemide (LASIX) 20 MG tablet Take 20 mg by mouth as needed for fluid or edema.     gabapentin (NEURONTIN) 400 MG capsule Take 400 mg by mouth 2 (two)  times daily.     losartan-hydrochlorothiazide (HYZAAR) 50-12.5 MG tablet Take 1 tablet by mouth daily.     meloxicam (MOBIC) 15 MG tablet Take 15 mg by mouth daily.     metFORMIN (GLUCOPHAGE) 500 MG tablet Take 500 mg by mouth daily.     metoprolol succinate (TOPROL-XL) 25 MG 24 hr tablet Take 1 tablet (25 mg total) by mouth 2 (two) times daily. 180 tablet 0   montelukast (SINGULAIR) 10 MG tablet Take 10 mg by mouth at bedtime.  Multiple Vitamins-Minerals (OCUVITE-LUTEIN PO) Take 1 tablet by mouth daily.     nitroGLYCERIN (NITROSTAT) 0.4 MG SL tablet Place 0.4 mg under the tongue every 5 (five) minutes as needed for chest pain.     omega-3 fish oil (MAXEPA) 1000 MG CAPS capsule Take 1,000 mg by mouth daily.     pantoprazole (PROTONIX) 40 MG tablet Take 1 tablet (40 mg total) by mouth daily. Call [phone] to schedule an office visit for more refills 90 tablet 0   potassium chloride (KLOR-CON) 10 MEQ tablet Take 10 mEq by mouth daily.     vitamin E 180 MG (400 UNITS) capsule Take 400 Units by mouth daily.     WIXELA INHUB 250-50 MCG/ACT AEPB INHALE 1 PUFF INTO THE LUNGS IN THE MORNING AND AT BEDTIME. 180 each 3   zinc gluconate 50 MG tablet Take 50 mg by mouth daily.     No current facility-administered medications for this visit.    REVIEW OF SYSTEMS:  [X]  denotes positive finding, [ ]  denotes negative finding Cardiac  Comments:  Chest pain or chest pressure:    Shortness of breath upon exertion:    Short of breath when lying flat:    Irregular heart rhythm:        Vascular    Pain in calf, thigh, or hip brought on by ambulation:    Pain in feet at night that wakes you up from your sleep:     Blood clot in your veins:    Leg swelling:  x Right leg      Pulmonary    Oxygen at home:    Productive cough:     Wheezing:         Neurologic    Sudden weakness in arms or legs:     Sudden numbness in arms or legs:     Sudden onset of difficulty speaking or slurred speech:     Temporary loss of vision in one eye:     Problems with dizziness:         Gastrointestinal    Blood in stool:     Vomited blood:         Genitourinary    Burning when urinating:     Blood in urine:        Psychiatric    Major depression:         Hematologic    Bleeding problems:    Problems with blood clotting too easily:        Skin    Rashes or ulcers:        Constitutional    Fever or chills:      PHYSICAL EXAM: There were no vitals filed for this visit.  GENERAL: The patient is a well-nourished female, in no acute distress. The vital signs are documented above. CARDIAC: There is a regular rate and rhythm.  VASCULAR:  Palpable femoral pulses bilaterally Palpable DP pulses bilaterally No lower extremity ulcerations Notable spider veins both lower extremities with some varicosities as well PULMONARY: No respiratory distress ABDOMEN: Soft and non-tender. MUSCULOSKELETAL: There are no major deformities or cyanosis. NEUROLOGIC: No focal weakness or paresthesias are detected. SKIN: There are no ulcers or rashes noted. PSYCHIATRIC: The patient has a normal affect.  DATA:    Lower Venous Reflux Study   Patient Name:  WREATHA JONG  Date of Exam:   08/18/2022  Medical Rec #: [id number]          Accession #:  [id number]  Date of Birth: 01-24-1946          Patient Gender: F  Patient Age:   47 years  Exam Location:  [facility]  Procedure:      VAS [place] LOWER EXTREMITY VENOUS REFLUX  Referring Phys: Cristal Deer DICKSON    ---------------------------------------------------------------------------  -----    Indications: Varicosities.    Performing Technologist: Dorthula Matas RVS, RCS     Examination Guidelines: A complete evaluation includes B-mode imaging,  spectral  Doppler, color Doppler, and power Doppler as needed of all accessible  portions  of each vessel. Bilateral testing is considered an integral part of a  complete   examination. Limited examinations for reoccurring indications may be  performed  as noted. The reflux portion of the exam is performed with the patient in  reverse Trendelenburg.  Significant venous reflux is defined as >500 ms in the superficial venous  system, and >1 second in the deep venous system.     Venous Reflux Times  +--------------+---------+------+-----------+------------+--------+  RIGHT        Reflux NoRefluxReflux TimeDiameter cmsComments                          Yes                                   +--------------+---------+------+-----------+------------+--------+  CFV                    yes   >1 second                       +--------------+---------+------+-----------+------------+--------+  FV mid                  yes   >1 second                       +--------------+---------+------+-----------+------------+--------+  Popliteal    no                                              +--------------+---------+------+-----------+------------+--------+  GSV at SFJ              yes    >500 ms      0.89              +--------------+---------+------+-----------+------------+--------+  GSV prox thigh          yes    >500 ms      0.59              +--------------+---------+------+-----------+------------+--------+  GSV mid thigh           yes    >500 ms      0.50              +--------------+---------+------+-----------+------------+--------+  GSV dist thigh          yes    >500 ms      0.61              +--------------+---------+------+-----------+------------+--------+  GSV at knee             yes    >500 ms      0.43              +--------------+---------+------+-----------+------------+--------+  GSV prox calf no                            0.28              +--------------+---------+------+-----------+------------+--------+  SSV Pop Fossa no                            0.24               +--------------+---------+------+-----------+------------+--------+         Summary:  Right:  - No evidence of deep vein thrombosis from the common femoral through the  popliteal veins.  - No evidence of superficial venous thrombosis.  - The deep venous system is not competent.  - The great saphenous vein is not competent.  - The small saphenous vein is competent.    *See table(s) above for measurements and observations.   Assessment/Plan:  77 y.o. female, with history of breast cancer, coronary artery disease, diabetes, hypertension that presents for evaluation of varicose veins with leg swelling.  States she previously had a stripping of her left leg great saphenous vein.  She has had swelling in the contralateral right leg for years that has been manageable.  Over the last several months she feels like the swelling is gotten worse and is not controlled with her compression stocking.  Exam consistent with CEAP classification C3 given edema.  I discussed her reflux study does confirm chronic venous insufficiency in the right leg with valvular reflux.  Has evidence of both superficial and deep venous reflux.  Her right great saphenous vein has a long segment superficial reflux including the junction.  I again reiterated conservative measures with leg elevation, compression, exercise and weight loss.  She is already doing all of these things.  I do think she would be a candidate for right great saphenous vein ablation and I will have her come back in 3 months to see one of my partners for evaluation.   Cephus Shelling, MD [facility] Office: [phone]

## 2022-09-07 ENCOUNTER — Other Ambulatory Visit: Payer: Self-pay | Admitting: Cardiology

## 2022-09-07 DIAGNOSIS — M1711 Unilateral primary osteoarthritis, right knee: Secondary | ICD-10-CM | POA: Diagnosis not present

## 2022-09-07 DIAGNOSIS — I209 Angina pectoris, unspecified: Secondary | ICD-10-CM

## 2022-09-15 ENCOUNTER — Ambulatory Visit (INDEPENDENT_AMBULATORY_CARE_PROVIDER_SITE_OTHER): Payer: Medicare Other | Admitting: Podiatry

## 2022-09-15 DIAGNOSIS — E088 Diabetes mellitus due to underlying condition with unspecified complications: Secondary | ICD-10-CM

## 2022-09-15 DIAGNOSIS — L600 Ingrowing nail: Secondary | ICD-10-CM

## 2022-09-15 NOTE — Patient Instructions (Signed)

## 2022-09-15 NOTE — Progress Notes (Signed)
Subjective:  Patient ID: Heather Chavez, female    DOB: 03-03-1946,  MRN: [id number]  Chief Complaint  Patient presents with   Ingrown Toenail    Right hallux medial border.    Gout    Patient is not sure if the pain is the ingrown toenail or gout. She had blood work completed and had increase of uric acid. She is currently taking allopurinol daily.     77 y.o. female presents with concern for ingrown nail on the right great toe.  She has pain with any pressure on the nail.  She does report that there was a pus pocket that drained out from the inside border of the nail recently.  Also has a history of gout and is taking medications for that.  Past Medical History:  Diagnosis Date   Advanced nonexudative age-related macular degeneration of right eye with subfoveal involvement 09/21/2019   Advanced nonexudative age-related macular degeneration of right eye without subfoveal involvement 09/21/2019   Allergic rhinitis    Angina pectoris (HCC) 05/17/2020   Bilateral breast cancer (HCC) 11/23/2013   BMI 34.0-34.9,adult 09/25/2019   Breast cancer (HCC)    Bronchitis, chronic (HCC)    CAD (coronary artery disease)    Cancer (HCC)    Cardiac murmur 05/17/2020   Coronary artery calcification 05/17/2020   Degenerative retinal drusen of right eye 09/21/2019   Diabetes mellitus due to underlying condition with unspecified complications (HCC) 05/17/2020   Diverticulosis    Dry eyes 06/25/2016   Dyspnea    Emphysema/COPD (HCC)    Essential hypertension 05/17/2020   Estrogen receptor positive neoplasm 09/24/2015   Family history of malignant neoplasm of breast 11/23/2013   Family history of peritoneal cancer 11/23/2013   Fuchs' corneal dystrophy 11/03/2016   GERD (gastroesophageal reflux disease)    History of colon polyps    History of right breast cancer 09/19/2018   Hypertension    IBS (irritable bowel syndrome)    with D   Intermediate stage nonexudative age-related macular  degeneration of left eye 09/21/2019   Iritis 11/03/2016   Macular degeneration    Major depressive disorder    Malignant neoplasm of upper-inner quadrant of left breast in female, estrogen receptor positive (HCC) 09/01/2016   Mixed dyslipidemia 05/17/2020   Obesity    Obesity (BMI 35.0-39.9 without comorbidity) 05/17/2020   OSA on CPAP 10/29/2020   Diagnosed some 3 months prior after pneumonia episode, Dr. Craige Cotta pulmonology   Personal history of chemotherapy    Personal history of radiation therapy    Pseudophakia 09/21/2019   Right-sided chest wall pain    SVT (supraventricular tachycardia) 05/23/2020   Varicose veins of left lower extremity with complications 08/11/2016    Allergies  Allergen Reactions   Codeine Other (See Comments) and Nausea And Vomiting    Other reaction(s): Other (see comments), Vomiting   Nexlizet [Bempedoic Acid-Ezetimibe] Other (See Comments)   Pitavastatin Other (See Comments)    Leg cramps   Rosuvastatin Other (See Comments)    Muscle pain   Acetaminophen Other (See Comments)    Oher reaction(s): Eye Redness, Other (see comments)     ROS: Negative except as per HPI above  Objective:  General: AAO x3, NAD  Dermatological: Incurvation is present along the medial nail border of the right great toe. There is localized edema without any erythema or increase in warmth around the nail border. There is no drainage or pus. There is no ascending cellulitis. No malodor. No open lesions or  pre-ulcerative lesions.    Vascular:  Dorsalis Pedis artery and Posterior Tibial artery pedal pulses are 2/4 bilateral.  Capillary fill time < 3 sec to all digits.   Neruologic: Grossly intact via light touch bilateral. Protective threshold intact to all sites bilateral.   Musculoskeletal: No gross boney pedal deformities bilateral. No pain, crepitus, or limitation noted with foot and ankle range of motion bilateral. Muscular strength 5/5 in all groups tested  bilateral.  Gait: Unassisted, Nonantalgic.   No images are attached to the encounter.  Assessment:   1. Ingrown nail of great toe of right foot   2. Diabetes mellitus due to underlying condition with unspecified complications [facility])      Plan:  Patient was evaluated and treated and all questions answered.   Ingrown Nail, right -Patient elects to proceed with minor surgery to remove ingrown toenail today. Consent reviewed and signed by patient. -Ingrown nail excised. See procedure note. -Educated on post-procedure care including soaking. Written instructions provided and reviewed. -Patient to follow up in 2 weeks for nail check.  Procedure: Excision of Ingrown Toenail Location: Right 1st toe medial nail borders. Anesthesia: Lidocaine 1% plain; 1.5 mL and Marcaine 0.5% plain; 1.5 mL, digital block. Skin Prep: Betadine. Dressing: Silvadene; telfa; dry, sterile, compression dressing. Technique: Following skin prep, the toe was exsanguinated and a tourniquet was secured at the base of the toe. The affected nail border was freed, split with a nail splitter, and excised. Chemical matrixectomy was then performed with phenol and irrigated out with alcohol. The tourniquet was then removed and sterile dressing applied. Disposition: Patient tolerated procedure well. Patient to return in 2 weeks for follow-up.    Return in about 2 weeks (around 09/29/2022) for nail check.          Corinna Gab, DPM [facility]

## 2022-09-24 ENCOUNTER — Ambulatory Visit (INDEPENDENT_AMBULATORY_CARE_PROVIDER_SITE_OTHER): Payer: Medicare Other | Admitting: Podiatry

## 2022-09-24 DIAGNOSIS — L02611 Cutaneous abscess of right foot: Secondary | ICD-10-CM

## 2022-09-24 DIAGNOSIS — L03031 Cellulitis of right toe: Secondary | ICD-10-CM

## 2022-09-24 MED ORDER — DOXYCYCLINE HYCLATE 100 MG PO CAPS: 100.0000 mg | ORAL_CAPSULE | Freq: Two times a day (BID) | ORAL | 0 refills | Status: AC

## 2022-09-24 NOTE — Patient Instructions (Signed)

## 2022-09-24 NOTE — Progress Notes (Unsigned)
Chief Complaint  Patient presents with   Ingrown Toenail    Infected right toe she states it was sore after she went in for a pedicure so she went back but they did not want to touch it because there was puss coming out so she went to see her PCP told her to come here she has been soaking with epsom salt    HPI: 77 y.o. female presents today after having a right hallux PNA performed of both borders by Dr. Annamary Rummage.  This was performed on 09/15/2022.  She notes that there has been increasing pain redness and drainage from the area.  She became concerned and called to make this appointment today.  She has been performing Epsom salt soaks.  She had a previous scheduled PNA recheck this coming Tuesday.  Past Medical History:  Diagnosis Date   Advanced nonexudative age-related macular degeneration of right eye with subfoveal involvement 09/21/2019   Advanced nonexudative age-related macular degeneration of right eye without subfoveal involvement 09/21/2019   Allergic rhinitis    Angina pectoris (HCC) 05/17/2020   Bilateral breast cancer (HCC) 11/23/2013   BMI 34.0-34.9,adult 09/25/2019   Breast cancer (HCC)    Bronchitis, chronic (HCC)    CAD (coronary artery disease)    Cancer (HCC)    Cardiac murmur 05/17/2020   Coronary artery calcification 05/17/2020   Degenerative retinal drusen of right eye 09/21/2019   Diabetes mellitus due to underlying condition with unspecified complications (HCC) 05/17/2020   Diverticulosis    Dry eyes 06/25/2016   Dyspnea    Emphysema/COPD (HCC)    Essential hypertension 05/17/2020   Estrogen receptor positive neoplasm 09/24/2015   Family history of malignant neoplasm of breast 11/23/2013   Family history of peritoneal cancer 11/23/2013   Fuchs' corneal dystrophy 11/03/2016   GERD (gastroesophageal reflux disease)    History of colon polyps    History of right breast cancer 09/19/2018   Hypertension    IBS (irritable bowel syndrome)    with D    Intermediate stage nonexudative age-related macular degeneration of left eye 09/21/2019   Iritis 11/03/2016   Macular degeneration    Major depressive disorder    Malignant neoplasm of upper-inner quadrant of left breast in female, estrogen receptor positive (HCC) 09/01/2016   Mixed dyslipidemia 05/17/2020   Obesity    Obesity (BMI 35.0-39.9 without comorbidity) 05/17/2020   OSA on CPAP 10/29/2020   Diagnosed some 3 months prior after pneumonia episode, Dr. Craige Cotta pulmonology   Personal history of chemotherapy    Personal history of radiation therapy    Pseudophakia 09/21/2019   Right-sided chest wall pain    SVT (supraventricular tachycardia) 05/23/2020   Varicose veins of left lower extremity with complications 08/11/2016    Past Surgical History:  Procedure Laterality Date   BREAST BIOPSY     BREAST LUMPECTOMY     right brwast 2005/ left 2015   CATARACT EXTRACTION     CHOLECYSTECTOMY     COLONOSCOPY  04/17/2015   Colonic polyps status post polypectomy. Moderate to severe sigmoid diverticulosis   TUBAL LIGATION     VAGINAL HYSTERECTOMY     VEIN LIGATION AND STRIPPING      Allergies  Allergen Reactions   Codeine Other (See Comments) and Nausea And Vomiting    Other reaction(s): Other (see comments), Vomiting   Nexlizet [Bempedoic Acid-Ezetimibe] Other (See Comments)   Pitavastatin Other (See Comments)    Leg cramps   Rosuvastatin Other (See Comments)  Muscle pain   Acetaminophen Other (See Comments)    Oher reaction(s): Eye Redness, Other (see comments)    Physical Exam: There were no vitals filed for this visit.  General: The patient is alert and oriented x3 in no acute distress.  Dermatology: Skin is warm, dry and supple bilateral lower extremities. Interspaces are clear of maceration and debris.  The right hallux has localized erythema and edema and pain on palpation along the periungual area.  There is some maceration along the nail margins at the PNA sites.  No  necrosis or malodor is noted.    Vascular: Palpable pedal pulses bilaterally. Capillary refill within normal limits.    Assessment/Plan of Care: 1. Cellulitis and abscess of toe of right foot     Meds ordered this encounter  Medications   doxycycline (VIBRAMYCIN) 100 MG capsule    Sig: Take 1 capsule (100 mg total) by mouth 2 (two) times daily for 10 days.    Dispense:  20 capsule    Refill:  0   Discussed clinical findings with patient today.  Patient was instructed to continue with the Epsom salt soaks daily.  She will apply iodine to the area and daily and a light gauze dressing to help dry the area out but also provide antibiotic care.  Prescription for doxycycline 100 mg 1 tablet p.o. twice daily x 10 days sent to her pharmacy.  Will push her appointment from this Tuesday to an additional week out for follow-up with Dr. Shelva Majestic, DPM, FACFAS [facility]     [address] [place], [place] [address]                Office [phone]  Fax [phone]

## 2022-09-29 ENCOUNTER — Ambulatory Visit: Payer: BLUE CROSS/BLUE SHIELD | Admitting: Podiatry

## 2022-09-29 DIAGNOSIS — H04123 Dry eye syndrome of bilateral lacrimal glands: Secondary | ICD-10-CM | POA: Diagnosis not present

## 2022-09-29 DIAGNOSIS — G4733 Obstructive sleep apnea (adult) (pediatric): Secondary | ICD-10-CM | POA: Diagnosis not present

## 2022-09-29 DIAGNOSIS — H353114 Nonexudative age-related macular degeneration, right eye, advanced atrophic with subfoveal involvement: Secondary | ICD-10-CM | POA: Diagnosis not present

## 2022-09-29 DIAGNOSIS — H353122 Nonexudative age-related macular degeneration, left eye, intermediate dry stage: Secondary | ICD-10-CM | POA: Diagnosis not present

## 2022-10-06 ENCOUNTER — Ambulatory Visit: Payer: Medicare Other | Admitting: Podiatry

## 2022-10-06 ENCOUNTER — Ambulatory Visit
Admission: RE | Admit: 2022-10-06 | Discharge: 2022-10-06 | Disposition: A | Discharge status: Home / Self Care | Payer: Medicare Other | Source: Ambulatory Visit | Attending: Vascular Surgery | Admitting: Vascular Surgery

## 2022-10-06 DIAGNOSIS — Z1231 Encounter for screening mammogram for malignant neoplasm of breast: Secondary | ICD-10-CM

## 2022-10-06 DIAGNOSIS — L600 Ingrowing nail: Secondary | ICD-10-CM | POA: Diagnosis not present

## 2022-10-06 NOTE — Progress Notes (Signed)
Subjective: Heather Chavez is a 77 y.o.  female returns to office today for follow up evaluation after having right Hallux medial border nail ingrown removal with phenol and alcohol matrixectomy approximately 4 weeks ago. Patient has been soaking using white vinegar and applying topical antibiotic covered with bandaid daily. Patient denies fevers, chills, nausea, vomiting. Denies any calf pain, chest pain, SOB.   Objective:  Vitals: Reviewed  General: Well developed, nourished, in no acute distress, alert and oriented x3   Dermatology: Skin is warm, dry and supple bilateral. right hallux nail border appears to be clean, dry, with mild granular tissue and surrounding scab. There is no surrounding erythema, edema, drainage/purulence. The remaining nails appear unremarkable at this time. There are no other lesions or other signs of infection present.  Neurovascular status: Intact. No lower extremity swelling; No pain with calf compression bilateral.  Musculoskeletal: Decreased tenderness to palpation of the right hallux nail fold(s). Muscular strength within normal limits bilateral.   Assesement and Plan: S/p phenol and alcohol matrixectomy to the  right hallux nail medial, doing well.   -Continue soaking in epsom salts twice a day followed by antibiotic ointment and a band-aid. Can leave uncovered at night. Continue this until completely healed.  -If the area has not healed in 2 weeks, call the office for follow-up appointment, or sooner if any problems arise.  -Monitor for any signs/symptoms of infection. Call the office immediately if any occur or go directly to the emergency room. Call with any questions/concerns.        Corinna Gab, DPM [facility] / [facility]                   10/06/2022

## 2022-10-13 DIAGNOSIS — Z853 Personal history of malignant neoplasm of breast: Secondary | ICD-10-CM | POA: Diagnosis not present

## 2022-10-13 DIAGNOSIS — C50212 Malignant neoplasm of upper-inner quadrant of left female breast: Secondary | ICD-10-CM | POA: Diagnosis not present

## 2022-10-13 DIAGNOSIS — Z170 Estrogen receptor positive status [ER+]: Secondary | ICD-10-CM | POA: Diagnosis not present

## 2022-10-20 DIAGNOSIS — L821 Other seborrheic keratosis: Secondary | ICD-10-CM | POA: Diagnosis not present

## 2022-10-20 DIAGNOSIS — D485 Neoplasm of uncertain behavior of skin: Secondary | ICD-10-CM | POA: Diagnosis not present

## 2022-10-20 DIAGNOSIS — L570 Actinic keratosis: Secondary | ICD-10-CM | POA: Diagnosis not present

## 2022-10-20 DIAGNOSIS — L578 Other skin changes due to chronic exposure to nonionizing radiation: Secondary | ICD-10-CM | POA: Diagnosis not present

## 2022-10-22 DIAGNOSIS — H04123 Dry eye syndrome of bilateral lacrimal glands: Secondary | ICD-10-CM | POA: Diagnosis not present

## 2022-10-22 DIAGNOSIS — H353122 Nonexudative age-related macular degeneration, left eye, intermediate dry stage: Secondary | ICD-10-CM | POA: Diagnosis not present

## 2022-10-22 DIAGNOSIS — H353114 Nonexudative age-related macular degeneration, right eye, advanced atrophic with subfoveal involvement: Secondary | ICD-10-CM | POA: Diagnosis not present

## 2022-10-29 ENCOUNTER — Ambulatory Visit: Payer: BLUE CROSS/BLUE SHIELD | Admitting: Oncology

## 2022-11-05 NOTE — Progress Notes (Incomplete)
Patient Care Team: Dellia Beckwith, MD as Consulting Physician (Oncology) Lance Bosch, MD as Consulting Physician (Radiation Oncology) Marylen Ponto MD as PCP  Clinic Day: 09/30/22   Referring physician:     ASSESSMENT & PLAN:  Assessment & Plan: Stage IA (T1b N0 M0) hormone receptor positive Right breast cancer This was diagnosed in April, 2005. Pathology revealed a 6mm, grade 3, invasive ductal carcinoma with 1 sentinel node positive with a 3mm micrometastasis. Her estrogen and progesterone receptors were positive and her 2 neu negative.  She was treated with an lumpectomy. She was treated with 4 cycles of doxorubicin and cyclophosphamide and was placed on Anastrozole in November, 2005. She took 3-1/2 years, but was switched to Tamoxifen in April, 2009, due to arthralgias related to the Anastrozole. She completed 5 years of adjuvant hormonal therapy. We stopped seeing her in May, 2010 when she was 6 years post-op, and her primary care physician took over her follow-up.   Stage IA  (T1b N0 M0) hormone receptor positive Left breast cancer This was diagnosed in July, 2015   Pathology revealed a 6mm, grade 1, ductal carcinoma with negative sentinel node. Estrogen and progesterone receptors were positive and her 2 neu negative. Ki67 was 18%. She was treated with an lumpectomy and received adjuvant radiation to the left breast. She underwent testing for hereditary breast and ovarian cancer with the [employer] OvaNext 25 gene panel, which did not reveal any clinically significant mutation. There was a variant of uncertain significance of MSH2. Patient was placed on Anastrozole again in August, 2015 but experienced.  She had severe hot flashes with  Anastrozole. She also had anxiety and depression, but did not tolerate venlafaxine. She was then placed on paroxetine 20mg  daily in September, 2025. This initially helped the anxiety, depression, and hot flashes. In  October, 2016 she reported having several issues including worsening depression, mild cognitive difficulty, trouble finding words, and remembering things she had to do, as well as becoming easily overwhelmed by her normal activities. Due to these complaints, we discontinued Anastrozole. Her symptoms improved, so she was placed on Tamoxifen 20mg  daily for continued adjuvant hormonal therapy   Plan:    I provided 10 minutes of face-to-face time during this this encounter and > 50% was spent counseling as documented under my assessment and plan.   [facility] AT [facility] [address] [place] [address] Dept: [phone] Dept Fax: [phone]   No orders of the defined types were placed in this encounter.   CHIEF COMPLAINT:  CC:  Current Treatment:    HISTORY OF PRESENT ILLNESS:  The patient is a 77 y.o. woman with a history of stage IA (T1b N0 M0) hormone receptor positive breast cancer in April, 2005. She was treated with lumpectomy. Pathology revealed a 6mm, grade 3, invasive ductal carcinoma with 1 sentinel node positive with a 3mm micrometastasis. Estrogen and progesterone receptors were positive and her 2 neu negative. She was treated with 4 cycles of doxorubicin and cyclophosphamide. She was placed on Anastrozole in November, 2005. She took 3-1/2 years, but was switched to Tamoxifen in April, 2009, because of arthralgias related to the Anastrozole. She completed 5 years of adjuvant hormonal therapy. We stopped seeing her in May, 2010 when she was 6 years post-op, and her primary care physician took over her follow-up. She had a benign biopsy of the left breast in April, 2007 and a benign biopsy of  the right breast on April, 2008. She had a third benign biopsy of the right breast in November, 2008.   She was diagnosed with a new stage IA (T1b N0 M0) hormone receptor positive left breast cancer in July, 2015.  This was treated with lumpectomy. Pathology revealed a 6mm, grade 1, ductal carcinoma with negative sentinel node. Estrogen and progesterone receptors were positive and her 2 neu negative. Ki67 was 18%. She received adjuvant radiation to the left breast. She was placed on Anastrozole again in August, 2015. She underwent testing for hereditary breast and ovarian cancer with the [employer] OvaNext 25 gene panel, which did not reveal any clinically significant mutation. There was a variant of uncertain significance of MSH2. She had severe hot flashes with  Anastrozole. She also had anxiety and depression, but did not tolerate venlafaxine. She was placed on paroxetine 20mg  daily in September, 2025. This initially helped the anxiety, depression, and hot flashes. Bilateral diagnostic mammogram in May, 2016 at the [facility] in [place] did not reveal any evidence of malignancy. When she was seen in October, 2016 for routine follow-up, she was having several issues including worsening depression, mild cognitive difficulty, trouble finding words, and remembering things she had to do, as well as becoming easily overwhelmed by her normal activities. Due to these complaints, we discontinued Anastrozole. Her symptoms improved, so she was placed on Tamoxifen 20mg  daily for continued adjuvant hormonal therapy. Screening colonoscopy in February, 2017 revealed multiple polyps, all of which were benign polypoid colonic mucosa. She is not sure she wishes to travel to [facility] to continue follow-up with Dr. Chales Abrahams, so will likely have Dr. Georgiana Shore do her next screening.   She was seen off schedule in January, 2019 due to skin changes of the right breast, which were felt to be most likely due to radiation changes. She had a right diagnostic mammogram in January, which revealed stable mild anterior skin thickening. Bilateral diagnostic mammogram in June, 2019 did not reveal any evidence of malignancy. At her visit in June,  2020, we requested a breast cancer index to predict the benefits of extended endocrine therapy and risk of late recurrence. This revealed a recurrence score of 2.4%, so an additional 5 years of hormonal therapy was not recommended. She completed 5 years of hormonal therapy in August, 2020. She had routine digital screening bilateral mammogram done on October 06, 2022 that revealed no mammographic evidence of malignancy.    INTERVAL HISTORY:    REVIEW OF SYSTEMS:  Review of Systems  Constitutional: Negative.  Negative for appetite change, chills, diaphoresis, fatigue, fever and unexpected weight change.  HENT:  Negative.  Negative for hearing loss, lump/mass, mouth sores, nosebleeds, sore throat, tinnitus, trouble swallowing and voice change.   Eyes: Negative.  Negative for eye problems and icterus.  Respiratory: Negative.  Negative for chest tightness, cough, hemoptysis, shortness of breath and wheezing.   Cardiovascular: Negative.  Negative for chest pain, leg swelling and palpitations.  Gastrointestinal: Negative.  Negative for abdominal distention, abdominal pain, blood in stool, constipation, diarrhea, nausea, rectal pain and vomiting.  Endocrine: Negative.   Genitourinary: Negative.  Negative for bladder incontinence, difficulty urinating, dyspareunia, dysuria, frequency, hematuria, menstrual problem, nocturia, pelvic pain, vaginal bleeding and vaginal discharge.   Musculoskeletal: Negative.  Negative for arthralgias, back pain, flank pain, gait problem, myalgias, neck pain and neck stiffness.  Skin: Negative.  Negative for itching, rash and wound.  Neurological:  Negative for dizziness, extremity weakness, gait problem, headaches, light-headedness, numbness,  seizures and speech difficulty.  Hematological: Negative.  Negative for adenopathy. Does not bruise/bleed easily.  Psychiatric/Behavioral: Negative.  Negative for confusion, decreased concentration, depression, sleep disturbance and  suicidal ideas. The patient is not nervous/anxious.    VITALS:      PHYSICAL EXAM:  Physical Exam Vitals and nursing note reviewed.  Constitutional:      General: She is not in acute distress.    Appearance: Normal appearance. She is normal weight. She is not ill-appearing, toxic-appearing or diaphoretic.  HENT:     Head: Normocephalic and atraumatic.     Right Ear: Tympanic membrane, ear canal and external ear normal. There is no impacted cerumen.     Left Ear: Tympanic membrane, ear canal and external ear normal. There is no impacted cerumen.     Nose: Nose normal. No congestion or rhinorrhea.     Mouth/Throat:     Mouth: Mucous membranes are moist.     Pharynx: Oropharynx is clear. No oropharyngeal exudate or posterior oropharyngeal erythema.  Eyes:     General: No scleral icterus.       Right eye: No discharge.        Left eye: No discharge.     Extraocular Movements: Extraocular movements intact.     Conjunctiva/sclera: Conjunctivae normal.     Pupils: Pupils are equal, round, and reactive to light.  Neck:     Vascular: No carotid bruit.  Cardiovascular:     Rate and Rhythm: Normal rate and regular rhythm.     Pulses: Normal pulses.     Heart sounds: Normal heart sounds. No murmur heard.    No friction rub. No gallop.  Pulmonary:     Effort: Pulmonary effort is normal. No respiratory distress.     Breath sounds: Normal breath sounds. No stridor. No wheezing, rhonchi or rales.  Chest:     Chest wall: No tenderness.  Abdominal:     General: Bowel sounds are normal. There is no distension.     Palpations: Abdomen is soft. There is no hepatomegaly, splenomegaly or mass.     Tenderness: There is no abdominal tenderness. There is no right CVA tenderness, left CVA tenderness, guarding or rebound.     Hernia: No hernia is present.  Musculoskeletal:        General: No swelling, tenderness, deformity or signs of injury. Normal range of motion.     Cervical back: Normal range  of motion and neck supple. No rigidity or tenderness.     Right lower leg: No edema.     Left lower leg: No edema.  Lymphadenopathy:     Cervical: No cervical adenopathy.     Right cervical: No superficial, deep or posterior cervical adenopathy.    Left cervical: No superficial, deep or posterior cervical adenopathy.     Upper Body:     Right upper body: No supraclavicular, axillary or pectoral adenopathy.     Left upper body: No supraclavicular, axillary or pectoral adenopathy.  Skin:    General: Skin is warm and dry.     Coloration: Skin is not jaundiced or pale.     Findings: No bruising, erythema, lesion or rash.  Neurological:     General: No focal deficit present.     Mental Status: She is alert and oriented to person, place, and time. Mental status is at baseline.     Cranial Nerves: No cranial nerve deficit.     Sensory: No sensory deficit.     Motor: No  weakness.     Coordination: Coordination normal.     Gait: Gait normal.     Deep Tendon Reflexes: Reflexes normal.  Psychiatric:        Mood and Affect: Mood normal.        Behavior: Behavior normal.        Thought Content: Thought content normal.        Judgment: Judgment normal.    LABS:  No Labs Found.     STUDIES  EXAM: 10/07/2022 DIGITAL SCREENING BILATERAL MAMMOGRAM WITH TOMOSYNTHESIS AND CAD IMPRESSION: No mammographic evidence of malignancy. A result letter of this screening mammogram will be mailed directly to the patient.  HISTORY:   Allergies  Allergen Reactions   Codeine Other (See Comments) and Nausea And Vomiting    Other reaction(s): Other (see comments), Vomiting   Nexlizet [Bempedoic Acid-Ezetimibe] Other (See Comments)   Pitavastatin Other (See Comments)    Leg cramps   Rosuvastatin Other (See Comments)    Muscle pain   Acetaminophen Other (See Comments)    Oher reaction(s): Eye Redness, Other (see comments)    Past Medical History:  Diagnosis Date   Advanced nonexudative  age-related macular degeneration of right eye with subfoveal involvement 09/21/2019   Advanced nonexudative age-related macular degeneration of right eye without subfoveal involvement 09/21/2019   Allergic rhinitis    Angina pectoris (HCC) 05/17/2020   Bilateral breast cancer (HCC) 11/23/2013   BMI 34.0-34.9,adult 09/25/2019   Breast cancer (HCC)    Bronchitis, chronic (HCC)    CAD (coronary artery disease)    Cancer (HCC)    Cardiac murmur 05/17/2020   Coronary artery calcification 05/17/2020   Degenerative retinal drusen of right eye 09/21/2019   Diabetes mellitus due to underlying condition with unspecified complications (HCC) 05/17/2020   Diverticulosis    Dry eyes 06/25/2016   Dyspnea    Emphysema/COPD (HCC)    Essential hypertension 05/17/2020   Estrogen receptor positive neoplasm 09/24/2015   Family history of malignant neoplasm of breast 11/23/2013   Family history of peritoneal cancer 11/23/2013   Fuchs' corneal dystrophy 11/03/2016   GERD (gastroesophageal reflux disease)    History of colon polyps    History of right breast cancer 09/19/2018   Hypertension    IBS (irritable bowel syndrome)    with D   Intermediate stage nonexudative age-related macular degeneration of left eye 09/21/2019   Iritis 11/03/2016   Macular degeneration    Major depressive disorder    Malignant neoplasm of upper-inner quadrant of left breast in female, estrogen receptor positive (HCC) 09/01/2016   Mixed dyslipidemia 05/17/2020   Obesity    Obesity (BMI 35.0-39.9 without comorbidity) 05/17/2020   OSA on CPAP 10/29/2020   Diagnosed some 3 months prior after pneumonia episode, Dr. Craige Cotta pulmonology   Personal history of chemotherapy    Personal history of radiation therapy    Pseudophakia 09/21/2019   Right-sided chest wall pain    SVT (supraventricular tachycardia) 05/23/2020   Varicose veins of left lower extremity with complications 08/11/2016   Past Surgical History:  Procedure  Laterality Date   BREAST BIOPSY     BREAST LUMPECTOMY     right brwast 2005/ left 2015   CATARACT EXTRACTION     CHOLECYSTECTOMY     COLONOSCOPY  04/17/2015   Colonic polyps status post polypectomy. Moderate to severe sigmoid diverticulosis   TUBAL LIGATION     VAGINAL HYSTERECTOMY     VEIN LIGATION AND STRIPPING     Family History  Problem Relation Age of Onset   Cancer Mother 2       bilateral breast cancer ages 89 then 81. Peritoneal cancer at age 67.   Other Mother        Ms. Pilkenton's mother was adopted so have no information regarding maternal family  history other than her mother   Breast cancer Mother    Cancer Father 70       renal   Cancer Sister 43       breast   Breast cancer Sister    Breast cancer Daughter    Breast cancer Maternal Aunt    Cancer Paternal Aunt 75       breast   Cancer Paternal Grandmother        bilateral breast cancer at unknown age   Social History   Substance and Sexual Activity  Alcohol Use Yes   Social History   Tobacco Use  Smoking Status Former   Current packs/day: 0.00   Average packs/day: 2.5 packs/day for 15.0 years (37.5 ttl pk-yrs)   Types: Cigarettes   Start date: 03/09/1966   Quit date: 03/09/1981   Years since quitting: 41.6  Smokeless Tobacco Never   Social History   Substance and Sexual Activity  Drug Use No    I,Jasmine M Lassiter,acting as a scribe for Dellia Beckwith, MD.,have documented all relevant documentation on the behalf of Dellia Beckwith, MD,as directed by  Dellia Beckwith, MD while in the presence of Dellia Beckwith, MD.

## 2022-11-06 ENCOUNTER — Inpatient Hospital Stay: Payer: Medicare Other | Admitting: Oncology

## 2022-11-06 DIAGNOSIS — C50212 Malignant neoplasm of upper-inner quadrant of left female breast: Secondary | ICD-10-CM

## 2022-11-13 DIAGNOSIS — F32A Depression, unspecified: Secondary | ICD-10-CM | POA: Diagnosis not present

## 2022-11-13 DIAGNOSIS — Z6833 Body mass index (BMI) 33.0-33.9, adult: Secondary | ICD-10-CM | POA: Diagnosis not present

## 2022-11-13 DIAGNOSIS — R2241 Localized swelling, mass and lump, right lower limb: Secondary | ICD-10-CM | POA: Diagnosis not present

## 2022-11-13 DIAGNOSIS — R600 Localized edema: Secondary | ICD-10-CM | POA: Diagnosis not present

## 2022-11-19 DIAGNOSIS — H04123 Dry eye syndrome of bilateral lacrimal glands: Secondary | ICD-10-CM | POA: Diagnosis not present

## 2022-11-19 DIAGNOSIS — H353122 Nonexudative age-related macular degeneration, left eye, intermediate dry stage: Secondary | ICD-10-CM | POA: Diagnosis not present

## 2022-11-19 DIAGNOSIS — H353114 Nonexudative age-related macular degeneration, right eye, advanced atrophic with subfoveal involvement: Secondary | ICD-10-CM | POA: Diagnosis not present

## 2022-11-21 IMAGING — CT CT HEART MORP W/ CTA COR W/ SCORE W/ CA W/CM &/OR W/O CM
4 of 7 series · 9 of 20 positions shown, 10 images · IV contrast (APPLIED)
Comparison: 06/01/2018 chest radiograph. CTA chest 05/20/2020 from
[REDACTED].
COMPARISON: 06/01/2018 chest radiograph. CTA chest 05/20/2020 from
[REDACTED].

Addendum:
EXAM:
OVER-READ INTERPRETATION  CT CHEST

The following report is an over-read performed by radiologist Dr.
Oleida Kwarteng [REDACTED] on 07/02/2020. This over-read
does not include interpretation of cardiac or coronary anatomy or
pathology. The coronary CTA interpretation by the cardiologist is
attached.
CLINICAL DATA: 74F with coronary calcification, SVT, and
hypertension on flecainide.
Cardiac/Coronary  CT
TECHNIQUE: The patient was scanned on a [employer] Force scanner.

[Series 6: best diast 75 % · axial · 0.39mm/px · z∈[-67,-2]mm · 3 of 327 slices shown, 4 images]
[im 82/327  vessel]
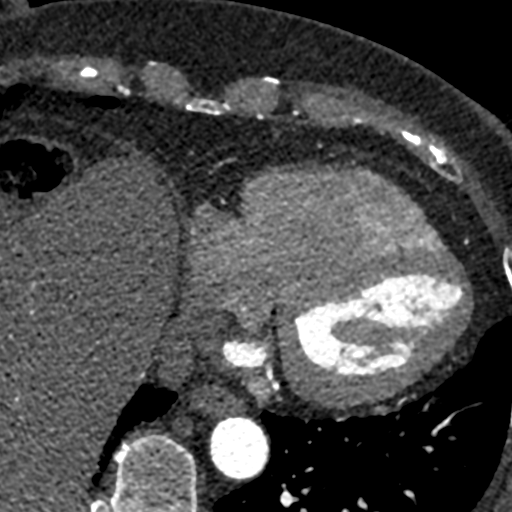
[im 82/327  lung]
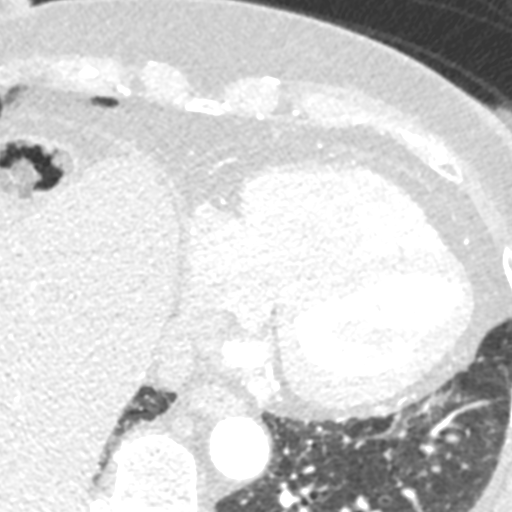
[im 164/327  vessel]
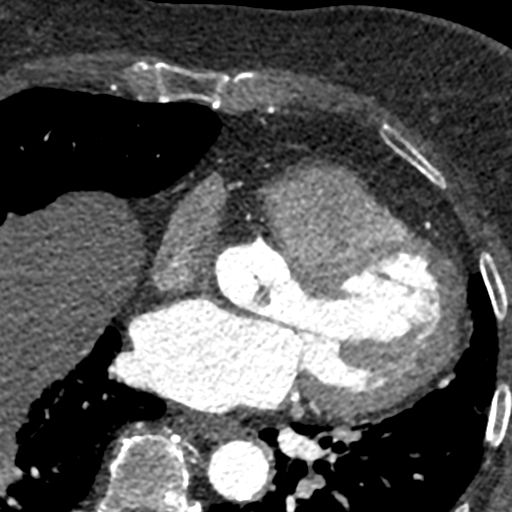
[im 245/327  vessel]
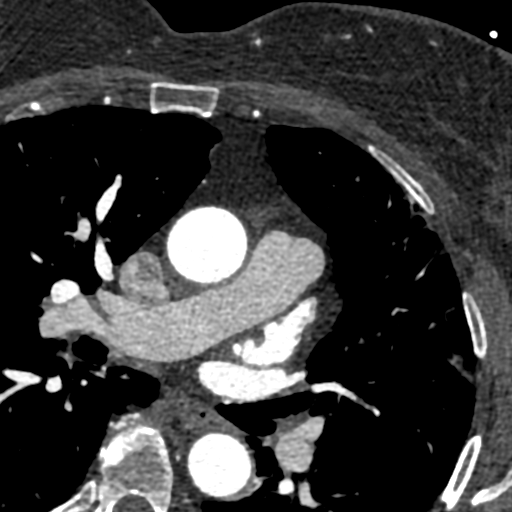

[Series 7: best syst 34 % · axial · 0.39mm/px · z∈[-59,-21]mm · 2 of 285 slices shown]
[im 95/285  vessel]
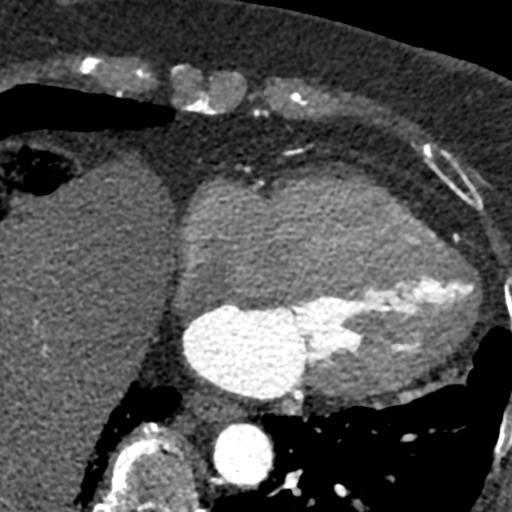
[im 190/285  vessel]
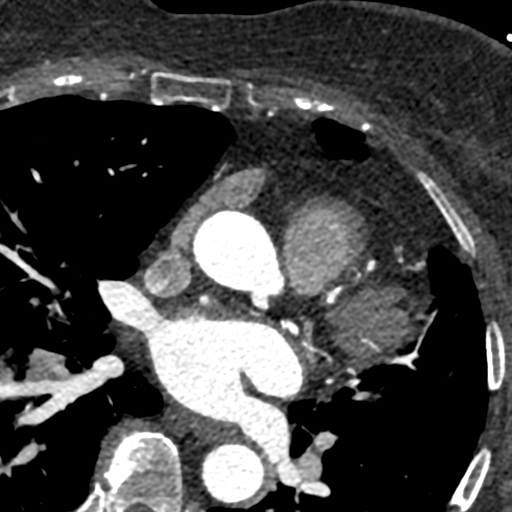

[Series 8: ts diast 75 % · axial · 0.39mm/px · z∈[-59,-21]mm · 2 of 285 slices shown]
[im 95/285  vessel]
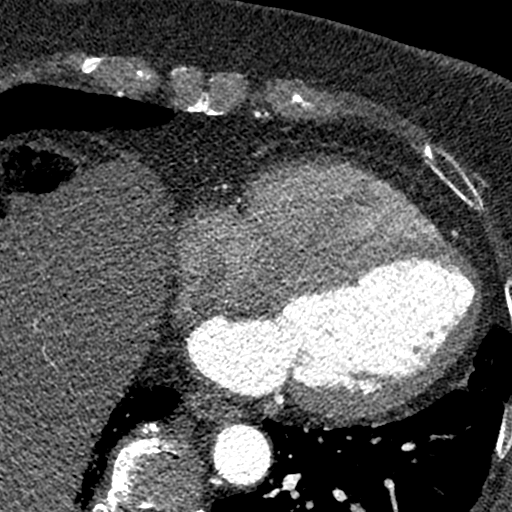
[im 190/285  vessel]
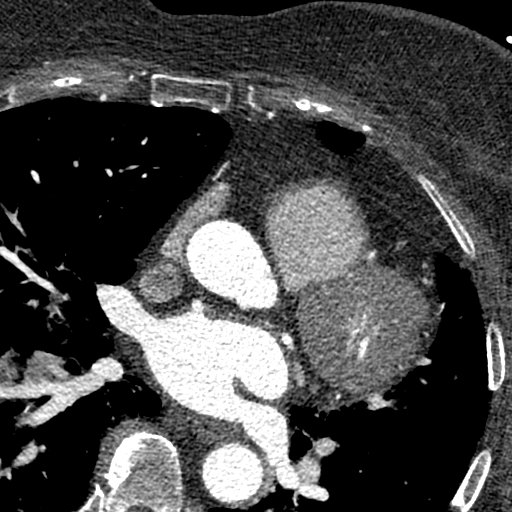

[Series 9: ts syst 34 % · axial · 0.39mm/px · z∈[-59,-21]mm · 2 of 285 slices shown]
[im 95/285  vessel]
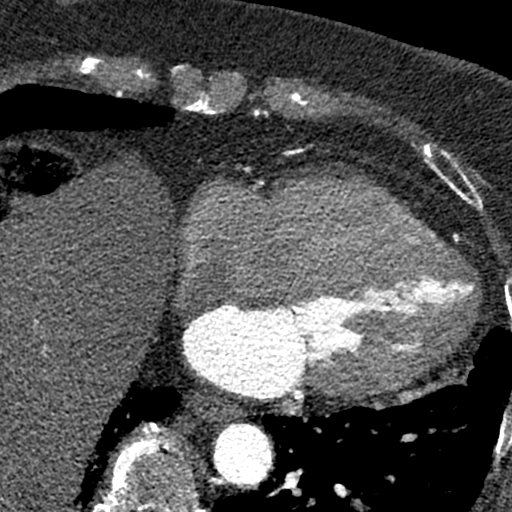
[im 190/285  vessel]
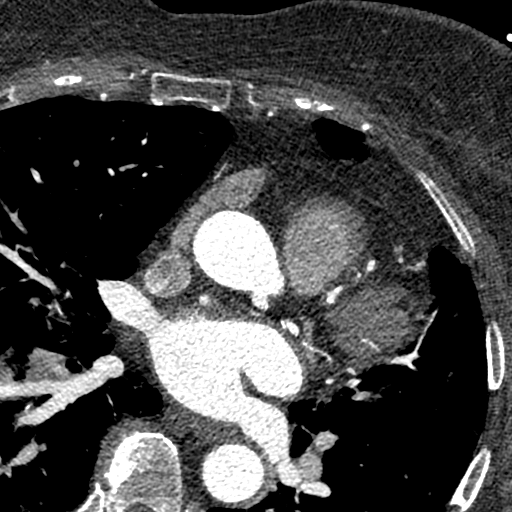

[9 of 20 positions shown; findings below may reference images not displayed]

FINDINGS: Vascular: Aortic atherosclerosis. No central pulmonary embolism, on
this non-dedicated study.

Mediastinum/Nodes: No imaged thoracic adenopathy.

Lungs/Pleura: No pleural fluid. Moderate right hemidiaphragm
elevation is chronic.

Mild to moderate centrilobular emphysema with extensive scarring
including in the lingula and right middle lobe.

Upper Abdomen: Normal imaged portions of the liver, spleen.

Musculoskeletal: Right breast biopsy or surgical clips.
IMPRESSION: 1. No acute findings in the imaged extracardiac chest.
2. Aortic Atherosclerosis (VNSVM-QVY.Y) and Emphysema (VNSVM-UVP.B).
FINDINGS: A 120 kV prospective scan was triggered in the descending thoracic
aorta at 111 HU's. Axial non-contrast 3 mm slices were carried out
through the heart. The data set was analyzed on a dedicated work
station and scored using the Agatson method. Gantry rotation speed
was 250 msecs and collimation was .6 mm. No beta blockade and 0.8 mg
of sl NTG was given. The 3D data set was reconstructed in 5%
intervals of the 67-82 % of the R-R cycle. Diastolic phases were
analyzed on a dedicated work station using MPR, MIP and VRT modes.
The patient received 80 cc of contrast.

Aorta: Normal size. Ascending aorta 2.9 cm. Minimal calcification in
the descending aorta and aortic arch. No dissection.

Aortic Valve:  Trileaflet.  Mild calcification.

Coronary Arteries:  Normal coronary origin.  Left dominance.

RCA is a small, non-dominant artery that gives rise to PDA and PLVB.
There is no plaque.

Left main is a large artery that gives rise to LAD, LCX, and RI
arteries. There is no calcification.

LAD is a large vessel that has minimal (<25%) calcified plaque
proximally and in the mid LAD. There are two diagonal vessels
without plaque.

LCX is a large dominant artery that gives rise to two OM branches
and L-PDA. There is minimal (<25%) calcified plaque in the proximal
LCX. There is no plaque in the OM branches.

RI is a small vessel without plaque.

Coronary Calcium Score:

Left main: 0

Left anterior descending artery:

Left circumflex artery:

Right coronary artery: 0

Total:

Percentile: 62nd

Other findings:

Normal pulmonary vein drainage into the left atrium.

Normal let atrial appendage without a thrombus.

Normal size of the pulmonary artery.

Mitral annular calcification.
IMPRESSION: 1. Coronary calcium score of 84. This was 62nd percentile for age-,
race-, and sex-matched controls.

2. Normal coronary origin with left dominance.

3.  Minimal plaque in the LAD and LCX.

4.  No obstructive coronary artery disease.

*** End of Addendum ***
EXAM:
OVER-READ INTERPRETATION  CT CHEST

The following report is an over-read performed by radiologist Dr.
[place] [REDACTED] on 07/02/2020. This over-read
does not include interpretation of cardiac or coronary anatomy or
pathology. The coronary CTA interpretation by the cardiologist is
attached.
FINDINGS: Vascular: Aortic atherosclerosis. No central pulmonary embolism, on
this non-dedicated study.

Mediastinum/Nodes: No imaged thoracic adenopathy.

Lungs/Pleura: No pleural fluid. Moderate right hemidiaphragm
elevation is chronic.

Mild to moderate centrilobular emphysema with extensive scarring
including in the lingula and right middle lobe.

Upper Abdomen: Normal imaged portions of the liver, spleen.

Musculoskeletal: Right breast biopsy or surgical clips.
IMPRESSION: 1. No acute findings in the imaged extracardiac chest.
2. Aortic Atherosclerosis (VNSVM-QVY.Y) and Emphysema (VNSVM-UVP.B).

## 2022-11-30 ENCOUNTER — Ambulatory Visit: Payer: BLUE CROSS/BLUE SHIELD | Admitting: Surgery

## 2022-12-07 ENCOUNTER — Ambulatory Visit: Payer: BLUE CROSS/BLUE SHIELD | Admitting: Surgery

## 2022-12-12 ENCOUNTER — Other Ambulatory Visit: Payer: Self-pay | Admitting: Cardiology

## 2022-12-12 DIAGNOSIS — I209 Angina pectoris, unspecified: Secondary | ICD-10-CM

## 2022-12-17 DIAGNOSIS — E7800 Pure hypercholesterolemia, unspecified: Secondary | ICD-10-CM | POA: Diagnosis not present

## 2022-12-17 DIAGNOSIS — E1169 Type 2 diabetes mellitus with other specified complication: Secondary | ICD-10-CM | POA: Diagnosis not present

## 2022-12-17 DIAGNOSIS — E669 Obesity, unspecified: Secondary | ICD-10-CM | POA: Diagnosis not present

## 2022-12-17 DIAGNOSIS — Z79899 Other long term (current) drug therapy: Secondary | ICD-10-CM | POA: Diagnosis not present

## 2022-12-17 DIAGNOSIS — Z23 Encounter for immunization: Secondary | ICD-10-CM | POA: Diagnosis not present

## 2022-12-17 DIAGNOSIS — Z0000 Encounter for general adult medical examination without abnormal findings: Secondary | ICD-10-CM | POA: Diagnosis not present

## 2022-12-24 ENCOUNTER — Encounter: Payer: Self-pay | Admitting: Oncology

## 2022-12-24 ENCOUNTER — Inpatient Hospital Stay: Payer: Medicare Other | Attending: Oncology | Admitting: Oncology

## 2022-12-24 VITALS — BP 120/68 | HR 89 | Temp 98.2°F | Resp 18 | Ht 65.0 in | Wt 211.7 lb

## 2022-12-24 DIAGNOSIS — C50212 Malignant neoplasm of upper-inner quadrant of left female breast: Secondary | ICD-10-CM

## 2022-12-24 DIAGNOSIS — Z170 Estrogen receptor positive status [ER+]: Secondary | ICD-10-CM

## 2022-12-24 NOTE — Progress Notes (Signed)
[facility]  [address] [place],  [place]  [address] [phone]  Clinic Day:  12/24/22   Referring physician:   ASSESSMENT & PLAN:  Assessment & Plan: History of Stage IA (T1b N0 M0) hormone receptor positive right breast cancer This was diagnosed in April, 2005. She was treated with a lumpectomy and pathology revealed a 6mm, grade 3, invasive ductal carcinoma with sentinel node positive with a 3mm micrometastasis. Estrogen and progesterone receptors were positive and her 2 neu negative. She was treated with 4 cycles of doxorubicin and cyclophosphamide. She was then placed on Anastrozole in November, 2005 and took that for 3.43yrs, but was switched to Tamoxifen in April, 2009 because of her arthralgias related to Anastrozole. She completed 5 years of adjuvant hormonal therapy.   History of Stage IA (T1b N0 M0) hormone receptor positive left breast cancer This was diagnosed in July, 2015. This was treated with lumpectomy and pathology revealed a 6mm, grade 1, ductal carcinoma with negative sentinel node. Estrogen and progesterone receptors were positive and her 2 neu negative and Ki 67 was 18%. She received adjuvant radiation to the left breast and was then placed on Anastrozole again in August, 2015. This was stopped in October, 2016 due to severe hot flashes, anxiety, and depression, mild cognitive difficulty, trouble finding words, and remembering things as well as becoming easily overwhelmed. She did not tolerate venlafaxine and was placed on paroxetine 20mg  daily in September, 2015 and this improved her symptoms. She was later changed to Lexapro. She was placed on Tamoxifen 20 mg daily for continued adjuvant hormonal therapy until she completed 5 years.   Plan: She informed me that her husband died this year in Aug 05, 2022. She is here today as she has experienced a climax on waking up multiple occasions. She is not sure if this is triggered by her husband's death  or her estrogen spiking so she called for a added appointment. I informed her that this could be caused by her estrogen but there is no need to worry. I mainly reassured her that I don't feel further investigation is necessary. Examination today remains stable. She had a screening bilateral mammogram done on 10/06/2022 scheduled by Dr. Georgiana Shore that was clear. He follow her regularly with breast exams. I will see her back as needed. The patient understands the plans discussed today and is in agreement with them.  She knows to contact our office if she develops concerns prior to her next appointment.  I provided 13 minutes of face-to-face time during this encounter and > 50% was spent counseling as documented under my assessment and plan.    Learta Codding  [facility] AT [facility] [address] [place] [address] Dept: [phone] Dept Fax: [phone]    CHIEF COMPLAINT:  CC:  history of Stage IA (T1b N0 M0) hormone receptor positive right and left breast cancer  Current Treatment: Surveillance   HISTORY OF PRESENT ILLNESS:  This patient is a 76 y.o. woman with a history of a stage IA (T1b N0 M0) hormone receptor positive right breast cancer in April, 2005. She was treated with lumpectomy. Pathology revealed a 6mm, grade 3, invasive ductal carcinoma with sentinel node positive with a 3mm micrometastasis. Estrogen and progesterone receptors were positive and her 2 neu negative. She was treated with 4 cycles of doxorubicin and cyclophosphamide. She was placed on Anastrozole in November, 2005. She took that for 3.66yrs, but was switched  to Tamoxifen in April, 2009 because of her arthralgias related to Anastrozole. She completed 5 years of adjuvant hormonal therapy. We stopped seeing her in Aug 07, 2008 when she was 54yrs post-op, and her PCP took over her follow-ups. She had a benign biopsy of the left breast in April, 2007 and a benign biopsy  of the right breast in April, 2008. She was then diagnosed with a new stage IA (T1b N0 M0) hormone receptor positive left breast cancer in July, 2015. This was treated with lumpectomy. Pathology revealed a 6mm, grade 1, ductal carcinoma with negative sentinel node. Estrogen and progesterone receptors were positive and her 2 neu negative. Ki 67 was 18%. She received adjuvant radiation to the left breast. She was placed on Anastrozole again in August, 2015. She underwent testing for hereditary breast and ovarian cancer with the [employer] OvaNext 25 gene panel, which did not reveal any clinically significant mutation. She did have a variant of uncertain significance of MSH2. She had severe hot flashes with Anastrozole. She also had anxiety and depression, but did not tolerate venlafaxine. She was placed on paroxetine 20mg  daily in September, 2015. This initially helped the anxiety, depression, and hot flashes. Bilateral mammogram in 08-Aug-2014 at the [facility] in [place] did not reveal any evidence of malignancy. When she was seen in October, 2016 for routine follow-up she was having several issues including worsening depression, mild cognitive difficulty, trouble finding words, and remembering things she had to do as well as becoming easily overwhelmed by her normal activities. Due to these symptoms we discontinued the Anastrozole. Her symptoms improved, so she was placed on Tamoxifen 20mg  daily for continued adjuvant hormonal therapy. She was seen in January, 2017 due to skin changes in the right breast which were felt to be radiation changes. She had a right diagnostic mammogram in January, 2017 which revealed stable mild anterior skin thickening. She reports hot flashes. Bilateral diagnostic mammogram done on June 5th, 2017 did not reveal any evidence of malignancy. She had a previous hysterectomy and unilateral oophorectomy for benign disease at a fairly young age, so does not require pap smears.  Screening colonoscopy in February, 2017 revealed multiple polyps, all of which were benign polypoid colonic mucosa. She is not sure if she wishes to travel to [facility] to continue follow-up with Dr. Chales Abrahams, so she will likely have Dr. Georgiana Shore do her next screening colonoscopy.   INTERVAL HISTORY:  Heather Chavez is here for an added on clinical assessment for her history of Stage IA (T1b N0 M0) hormone receptor positive right breast cancer diagnosed in 2005 and history of Stage IA (T1b N0 M0) hormone receptor positive left breast cancer diagnosed in 2015. Patient states that she feels well and has no complaints of pain. She informed me that her husband died this year in 2022/08/08. She is here today as she has experienced a climax on waking up multiple occasions. She is not sure if this is triggered by her husband's death or her estrogen spiking so she called for a added appointment. I informed her that this could be caused by her estrogen but there is no need to worry. I mainly reassured her that I don't feel further investigation is necessary. Examination today remains stable. She had a screening bilateral mammogram done on 10/06/2022 scheduled by Dr. Georgiana Shore that was clear. He follow her regularly with breast exams. I will see her back as needed. She denies signs of infection such as sore throat, sinus drainage, cough, or  urinary symptoms.  She denies fevers or recurrent chills. She denies pain. She denies nausea, vomiting, chest pain, dyspnea or cough. Her appetite is good and her weight has increased 5 pounds over last 4 months .  REVIEW OF SYMPTOMS:  Review of Systems  Constitutional: Negative.  Negative for appetite change, chills, diaphoresis, fatigue, fever and unexpected weight change.  HENT:  Negative.  Negative for hearing loss, lump/mass, mouth sores, nosebleeds, sore throat, tinnitus, trouble swallowing and voice change.   Eyes: Negative.  Negative for eye problems and icterus.  Respiratory: Negative.   Negative for chest tightness, cough, hemoptysis, shortness of breath and wheezing.   Cardiovascular: Negative.  Negative for chest pain, leg swelling and palpitations.  Gastrointestinal: Negative.  Negative for abdominal distention, abdominal pain, blood in stool, constipation, diarrhea, nausea, rectal pain and vomiting.  Endocrine: Negative.   Genitourinary: Negative.  Negative for bladder incontinence, difficulty urinating, dyspareunia, dysuria, frequency, hematuria, menstrual problem, nocturia, pelvic pain, vaginal bleeding and vaginal discharge.   Musculoskeletal: Negative.  Negative for arthralgias, back pain, flank pain, gait problem, myalgias, neck pain and neck stiffness.  Skin: Negative.  Negative for itching, rash and wound.  Neurological:  Negative for dizziness, extremity weakness, gait problem, headaches, light-headedness, numbness, seizures and speech difficulty.  Hematological: Negative.  Negative for adenopathy. Does not bruise/bleed easily.  Psychiatric/Behavioral: Negative.  Negative for confusion, decreased concentration, depression, sleep disturbance and suicidal ideas. The patient is not nervous/anxious.    VITALS:   Today's Vitals   12/24/22 1034  BP: 120/68  Pulse: 89  Resp: 18  Temp: 98.2 F (36.8 C)  TempSrc: Oral  SpO2: 98%  Weight: 211 lb 11.2 oz (96 kg)  Height: 5\' 5"  (1.651 m)  PainSc: 0-No pain   Body mass index is 35.23 kg/m.  PHYSICAL EXAM:  Physical Exam Vitals and nursing note reviewed.  Constitutional:      General: She is not in acute distress.    Appearance: Normal appearance. She is normal weight. She is not ill-appearing, toxic-appearing or diaphoretic.  HENT:     Head: Normocephalic and atraumatic.     Right Ear: Tympanic membrane, ear canal and external ear normal. There is no impacted cerumen.     Left Ear: Tympanic membrane, ear canal and external ear normal. There is no impacted cerumen.     Nose: Nose normal. No congestion or  rhinorrhea.     Mouth/Throat:     Mouth: Mucous membranes are moist.     Pharynx: Oropharynx is clear. No oropharyngeal exudate or posterior oropharyngeal erythema.  Eyes:     General: No scleral icterus.       Right eye: No discharge.        Left eye: No discharge.     Extraocular Movements: Extraocular movements intact.     Conjunctiva/sclera: Conjunctivae normal.     Pupils: Pupils are equal, round, and reactive to light.  Neck:     Vascular: No carotid bruit.  Cardiovascular:     Rate and Rhythm: Normal rate and regular rhythm.     Pulses: Normal pulses.     Heart sounds: Normal heart sounds. No murmur heard.    No friction rub. No gallop.  Pulmonary:     Effort: Pulmonary effort is normal. No respiratory distress.     Breath sounds: Normal breath sounds. No stridor. No wheezing, rhonchi or rales.  Chest:     Chest wall: No tenderness.     Comments: Well healed scar in the superior  right breast at about 12 o'clock.  Well healed scar in the upper inner quadrant of the left breast and left axilla there is a slight firmness above that scar.  No masses in either breast.  Tenderness in the lower right sternum in the costochondral junction.  Abdominal:     General: Bowel sounds are normal. There is no distension.     Palpations: Abdomen is soft. There is no hepatomegaly, splenomegaly or mass.     Tenderness: There is no abdominal tenderness. There is no right CVA tenderness, left CVA tenderness, guarding or rebound.     Hernia: No hernia is present.  Musculoskeletal:        General: No swelling, tenderness, deformity or signs of injury. Normal range of motion.     Cervical back: Normal range of motion and neck supple. No rigidity or tenderness.     Right lower leg: No edema.     Left lower leg: No edema.  Lymphadenopathy:     Cervical: No cervical adenopathy.     Right cervical: No superficial, deep or posterior cervical adenopathy.    Left cervical: No superficial, deep or  posterior cervical adenopathy.     Upper Body:     Right upper body: No supraclavicular, axillary or pectoral adenopathy.     Left upper body: No supraclavicular, axillary or pectoral adenopathy.  Skin:    General: Skin is warm and dry.     Coloration: Skin is not jaundiced or pale.     Findings: No bruising, erythema, lesion or rash.  Neurological:     General: No focal deficit present.     Mental Status: She is alert and oriented to person, place, and time. Mental status is at baseline.     Cranial Nerves: No cranial nerve deficit.     Sensory: No sensory deficit.     Motor: No weakness.     Coordination: Coordination normal.     Gait: Gait normal.     Deep Tendon Reflexes: Reflexes normal.  Psychiatric:        Mood and Affect: Mood normal.        Behavior: Behavior normal.        Thought Content: Thought content normal.        Judgment: Judgment normal.    HISTORY:   Outpatient Medications Prior to Visit  Medication Sig   Boswellia-Glucosamine-Vit D (OSTEO BI-FLEX ONE PER DAY) TABS Take by mouth.   LEXAPRO 10 MG tablet    metoprolol tartrate (LOPRESSOR) 25 MG tablet Take 25 mg by mouth 2 (two) times daily.   Multiple Vitamins-Minerals (PRESERVISION AREDS 2 PO) Take by mouth.   [DISCONTINUED] hydrochlorothiazide (HYDRODIURIL) 25 MG tablet Take 1 tablet by mouth daily.   albuterol (VENTOLIN HFA) 108 (90 Base) MCG/ACT inhaler Inhale 2 puffs into the lungs every 4 (four) hours as needed for wheezing.   baclofen (LIORESAL) 10 MG tablet Take 10 mg by mouth 3 (three) times daily.   Biotin [address] MCG TABS Take 10,000 mcg by mouth daily.   fexofenadine (ALLEGRA) 180 MG tablet Take 180 mg by mouth.   fluticasone (FLONASE) 50 MCG/ACT nasal spray Place 1 spray into both nostrils daily.   furosemide (LASIX) 20 MG tablet Take 20 mg by mouth as needed for fluid or edema.   gabapentin (NEURONTIN) 400 MG capsule Take 400 mg by mouth 2 (two) times daily.   losartan-hydrochlorothiazide  (HYZAAR) 50-12.5 MG tablet Take 1 tablet by mouth daily.   metFORMIN (GLUCOPHAGE) 500  MG tablet Take 500 mg by mouth daily.   montelukast (SINGULAIR) 10 MG tablet Take 10 mg by mouth at bedtime.   nitroGLYCERIN (NITROSTAT) 0.4 MG SL tablet Place 0.4 mg under the tongue every 5 (five) minutes as needed for chest pain.   omega-3 fish oil (MAXEPA) 1000 MG CAPS capsule Take 1,000 mg by mouth daily.   pantoprazole (PROTONIX) 40 MG tablet Take 1 tablet (40 mg total) by mouth daily. Call [phone] to schedule an office visit for more refills   vitamin E 180 MG (400 UNITS) capsule Take 400 Units by mouth daily.   WIXELA INHUB 250-50 MCG/ACT AEPB INHALE 1 PUFF INTO THE LUNGS IN THE MORNING AND AT BEDTIME.   zinc gluconate 50 MG tablet Take 50 mg by mouth daily.   [DISCONTINUED] allopurinol (ZYLOPRIM) 100 MG tablet Take 100 mg by mouth daily.   [DISCONTINUED] meloxicam (MOBIC) 15 MG tablet Take 15 mg by mouth daily. (Patient not taking: Reported on 08/18/2022)   [DISCONTINUED] metoprolol succinate (TOPROL-XL) 25 MG 24 hr tablet Take 1 tablet (25 mg total) by mouth 2 (two) times daily. Patient needs an appointment for further refills. 2 nd attempt   [DISCONTINUED] Multiple Vitamins-Minerals (OCUVITE-LUTEIN PO) Take 1 tablet by mouth daily.   [DISCONTINUED] potassium chloride (KLOR-CON) 10 MEQ tablet Take 10 mEq by mouth daily.   No facility-administered medications prior to visit.    Past Medical History:  Diagnosis Date   Advanced nonexudative age-related macular degeneration of right eye with subfoveal involvement 09/21/2019   Advanced nonexudative age-related macular degeneration of right eye without subfoveal involvement 09/21/2019   Allergic rhinitis    Angina pectoris (HCC) 05/17/2020   Bilateral breast cancer (HCC) 11/23/2013   BMI 34.0-34.9,adult 09/25/2019   Breast cancer (HCC)    Bronchitis, chronic (HCC)    CAD (coronary artery disease)    Cancer (HCC)    Cardiac murmur 05/17/2020    Coronary artery calcification 05/17/2020   Degenerative retinal drusen of right eye 09/21/2019   Diabetes mellitus due to underlying condition with unspecified complications (HCC) 05/17/2020   Diverticulosis    Dry eyes 06/25/2016   Dyspnea    Emphysema/COPD (HCC)    Essential hypertension 05/17/2020   Estrogen receptor positive neoplasm 09/24/2015   Family history of malignant neoplasm of breast 11/23/2013   Family history of peritoneal cancer 11/23/2013   Fuchs' corneal dystrophy 11/03/2016   GERD (gastroesophageal reflux disease)    History of colon polyps    History of right breast cancer 09/19/2018   Hypertension    IBS (irritable bowel syndrome)    with D   Intermediate stage nonexudative age-related macular degeneration of left eye 09/21/2019   Iritis 11/03/2016   Macular degeneration    Major depressive disorder    Malignant neoplasm of upper-inner quadrant of left breast in female, estrogen receptor positive (HCC) 09/01/2016   Mixed dyslipidemia 05/17/2020   Obesity    Obesity (BMI 35.0-39.9 without comorbidity) 05/17/2020   OSA on CPAP 10/29/2020   Diagnosed some 3 months prior after pneumonia episode, Dr. Craige Cotta pulmonology   Personal history of chemotherapy    Personal history of radiation therapy    Pseudophakia 09/21/2019   Right-sided chest wall pain    SVT (supraventricular tachycardia) (HCC) 05/23/2020   Varicose veins of left lower extremity with complications 08/11/2016   Past Surgical History:  Procedure Laterality Date   BREAST BIOPSY     BREAST LUMPECTOMY     right brwast 2005/ left 2015  CATARACT EXTRACTION     CHOLECYSTECTOMY     COLONOSCOPY  04/17/2015   Colonic polyps status post polypectomy. Moderate to severe sigmoid diverticulosis   TUBAL LIGATION     VAGINAL HYSTERECTOMY     VEIN LIGATION AND STRIPPING     Family History  Problem Relation Age of Onset   Cancer Mother 37       bilateral breast cancer ages 45 then 71. Peritoneal cancer at  age 74.   Other Mother        Ms. Hund's mother was adopted so have no information regarding maternal family  history other than her mother   Breast cancer Mother    Cancer Father 32       renal   Cancer Sister 68       breast   Breast cancer Sister    Breast cancer Daughter    Breast cancer Maternal Aunt    Cancer Paternal Aunt 20       breast   Cancer Paternal Grandmother        bilateral breast cancer at unknown age   Social History   Substance and Sexual Activity  Alcohol Use Yes   Social History   Tobacco Use  Smoking Status Former   Current packs/day: 0.00   Average packs/day: 2.5 packs/day for 15.0 years (37.5 ttl pk-yrs)   Types: Cigarettes   Start date: 03/09/1966   Quit date: 03/09/1981   Years since quitting: 41.8  Smokeless Tobacco Never   Social History   Substance and Sexual Activity  Drug Use No   LABS:  No labs found.     STUDIES:  EXAM: 10/06/2022 DIGITAL SCREENING BILATERAL MAMMOGRAM WITH TOMOSYNTHESIS AND CAD IMPRESSION: No mammographic evidence of malignancy. A result letter of this screening mammogram will be mailed directly to the patient.    I,Jasmine M Lassiter,acting as a scribe for Dellia Beckwith, MD.,have documented all relevant documentation on the behalf of Dellia Beckwith, MD,as directed by  Dellia Beckwith, MD while in the presence of Dellia Beckwith, MD.

## 2023-01-04 DIAGNOSIS — H353124 Nonexudative age-related macular degeneration, left eye, advanced atrophic with subfoveal involvement: Secondary | ICD-10-CM | POA: Diagnosis not present

## 2023-01-04 DIAGNOSIS — H04123 Dry eye syndrome of bilateral lacrimal glands: Secondary | ICD-10-CM | POA: Diagnosis not present

## 2023-01-04 DIAGNOSIS — H353114 Nonexudative age-related macular degeneration, right eye, advanced atrophic with subfoveal involvement: Secondary | ICD-10-CM | POA: Diagnosis not present

## 2023-01-08 ENCOUNTER — Encounter: Payer: Self-pay | Admitting: Vascular Surgery

## 2023-01-11 ENCOUNTER — Encounter: Payer: Self-pay | Admitting: Surgery

## 2023-01-11 ENCOUNTER — Ambulatory Visit (INDEPENDENT_AMBULATORY_CARE_PROVIDER_SITE_OTHER): Payer: Medicare Other | Admitting: Surgery

## 2023-01-11 VITALS — BP 107/71 | HR 87 | Temp 98.2°F | Resp 20 | Ht 65.0 in | Wt 209.0 lb

## 2023-01-11 DIAGNOSIS — I83892 Varicose veins of left lower extremities with other complications: Secondary | ICD-10-CM

## 2023-01-11 NOTE — Progress Notes (Signed)
[facility] [place]  Patient name: Heather Chavez MRN: [id number] DOB: [birth year] Sex: female   REASON FOR VISIT:    Follow-up  HISOTRY OF PRESENT ILLNESS:    Heather Chavez is a 77 y.o. female who returns to a for follow-up of her varicose veins.  She was initially seen by Dr. Chestine Spore in June 2024.  She has previously had stripping of the left saphenous vein.  She has had swelling in the right leg for years which has been manageable however over the last several months, she feels like it has gotten worse.  She was seen by Dr. Hart Rochester in 2018 who had recommended laser ablation however this was not scheduled because she was taking care of her son-in-law who has had a stroke.  She has been wearing 20-30 thigh-high compression socks.  She has tried other conservative measures including leg elevation, exercise, and weight loss.  She denies any history of DVT.   PAST MEDICAL HISTORY:   Past Medical History:  Diagnosis Date   Advanced nonexudative age-related macular degeneration of right eye with subfoveal involvement 09/21/2019   Advanced nonexudative age-related macular degeneration of right eye without subfoveal involvement 09/21/2019   Allergic rhinitis    Angina pectoris (HCC) 05/17/2020   Bilateral breast cancer (HCC) 11/23/2013   BMI 34.0-34.9,adult 09/25/2019   Breast cancer (HCC)    Bronchitis, chronic (HCC)    CAD (coronary artery disease)    Cancer (HCC)    Cardiac murmur 05/17/2020   Coronary artery calcification 05/17/2020   Degenerative retinal drusen of right eye 09/21/2019   Diabetes mellitus due to underlying condition with unspecified complications (HCC) 05/17/2020   Diverticulosis    Dry eyes 06/25/2016   Dyspnea    Emphysema/COPD (HCC)    Essential hypertension 05/17/2020   Estrogen receptor positive neoplasm 09/24/2015   Family history of malignant neoplasm of breast 11/23/2013   Family  history of peritoneal cancer 11/23/2013   Fuchs' corneal dystrophy 11/03/2016   GERD (gastroesophageal reflux disease)    History of colon polyps    History of right breast cancer 09/19/2018   Hypertension    IBS (irritable bowel syndrome)    with D   Intermediate stage nonexudative age-related macular degeneration of left eye 09/21/2019   Iritis 11/03/2016   Macular degeneration    Major depressive disorder    Malignant neoplasm of upper-inner quadrant of left breast in female, estrogen receptor positive (HCC) 09/01/2016   Mixed dyslipidemia 05/17/2020   Obesity    Obesity (BMI 35.0-39.9 without comorbidity) 05/17/2020   OSA on CPAP 10/29/2020   Diagnosed some 3 months prior after pneumonia episode, Dr. Craige Cotta pulmonology   Personal history of chemotherapy    Personal history of radiation therapy    Pseudophakia 09/21/2019   Right-sided chest wall pain    SVT (supraventricular tachycardia) (HCC) 05/23/2020   Varicose veins of left lower extremity with complications 08/11/2016     FAMILY HISTORY:   Family History  Problem Relation Age of Onset   Cancer Mother 58       bilateral breast cancer ages 18 then 44. Peritoneal cancer at age 18.   Other Mother        Ms. Dildine's mother was adopted so have no information regarding maternal family  history other than her mother   Breast cancer Mother    Cancer Father 96       renal   Cancer Sister 71  breast   Breast cancer Sister    Breast cancer Daughter    Breast cancer Maternal Aunt    Cancer Paternal Aunt 52       breast   Cancer Paternal Grandmother        bilateral breast cancer at unknown age    SOCIAL HISTORY:   Social History   Tobacco Use   Smoking status: Former    Current packs/day: 0.00    Average packs/day: 2.5 packs/day for 15.0 years (37.5 ttl pk-yrs)    Types: Cigarettes    Start date: 03/09/1966    Quit date: 03/09/1981    Years since quitting: 41.8   Smokeless tobacco: Never  Substance Use Topics    Alcohol use: Yes     ALLERGIES:   Allergies  Allergen Reactions   Codeine Nausea And Vomiting and Other (See Comments)    Other reaction(s): Other (see comments), Vomiting   Nexlizet [Bempedoic Acid-Ezetimibe] Other (See Comments)   Pitavastatin Other (See Comments)    Leg cramps  Other Reaction(s): Other (See Comments)  Leg cramps    Leg cramps   Rosuvastatin Other (See Comments)    Muscle pain     CURRENT MEDICATIONS:   Current Outpatient Medications  Medication Sig Dispense Refill   albuterol (VENTOLIN HFA) 108 (90 Base) MCG/ACT inhaler Inhale 2 puffs into the lungs every 4 (four) hours as needed for wheezing.     baclofen (LIORESAL) 10 MG tablet Take 10 mg by mouth 3 (three) times daily.     Biotin 29562 MCG TABS Take 10,000 mcg by mouth daily.     Boswellia-Glucosamine-Vit D (OSTEO BI-FLEX ONE PER DAY) TABS Take by mouth.     fexofenadine (ALLEGRA) 180 MG tablet Take 180 mg by mouth.     fluticasone (FLONASE) 50 MCG/ACT nasal spray Place 1 spray into both nostrils daily.     furosemide (LASIX) 20 MG tablet Take 20 mg by mouth as needed for fluid or edema.     gabapentin (NEURONTIN) 400 MG capsule Take 400 mg by mouth 2 (two) times daily.     LEXAPRO 10 MG tablet      losartan-hydrochlorothiazide (HYZAAR) 50-12.5 MG tablet Take 1 tablet by mouth daily.     metFORMIN (GLUCOPHAGE) 500 MG tablet Take 500 mg by mouth daily.     metoprolol tartrate (LOPRESSOR) 25 MG tablet Take 25 mg by mouth 2 (two) times daily.     montelukast (SINGULAIR) 10 MG tablet Take 10 mg by mouth at bedtime.     Multiple Vitamins-Minerals (PRESERVISION AREDS 2 PO) Take by mouth.     nitroGLYCERIN (NITROSTAT) 0.4 MG SL tablet Place 0.4 mg under the tongue every 5 (five) minutes as needed for chest pain.     omega-3 fish oil (MAXEPA) 1000 MG CAPS capsule Take 1,000 mg by mouth daily.     pantoprazole (PROTONIX) 40 MG tablet Take 1 tablet (40 mg total) by mouth daily. Call [phone] to schedule  an office visit for more refills 90 tablet 0   vitamin E 180 MG (400 UNITS) capsule Take 400 Units by mouth daily.     WIXELA INHUB 250-50 MCG/ACT AEPB INHALE 1 PUFF INTO THE LUNGS IN THE MORNING AND AT BEDTIME. 180 each 3   zinc gluconate 50 MG tablet Take 50 mg by mouth daily.     No current facility-administered medications for this visit.    REVIEW OF SYSTEMS:   [X]  denotes positive finding, [ ]  denotes negative finding Cardiac  Comments:  Chest pain or chest pressure:    Shortness of breath upon exertion:    Short of breath when lying flat:    Irregular heart rhythm:        Vascular    Pain in calf, thigh, or hip brought on by ambulation:    Pain in feet at night that wakes you up from your sleep:     Blood clot in your veins:    Leg swelling:  x       Pulmonary    Oxygen at home:    Productive cough:     Wheezing:         Neurologic    Sudden weakness in arms or legs:     Sudden numbness in arms or legs:     Sudden onset of difficulty speaking or slurred speech:    Temporary loss of vision in one eye:     Problems with dizziness:         Gastrointestinal    Blood in stool:     Vomited blood:         Genitourinary    Burning when urinating:     Blood in urine:        Psychiatric    Major depression:         Hematologic    Bleeding problems:    Problems with blood clotting too easily:        Skin    Rashes or ulcers:        Constitutional    Fever or chills:      PHYSICAL EXAM:   Vitals:   01/11/23 1012  BP: 107/71  Pulse: 87  Resp: 20  Temp: 98.2 F (36.8 C)  SpO2: 96%  Weight: 209 lb (94.8 kg)  Height: 5\' 5"  (1.651 m)    GENERAL: The patient is a well-nourished female, in no acute distress. The vital signs are documented above. CARDIAC: There is a regular rate and rhythm.  VASCULAR: SonoSite was used to evaluate the right saphenous vein which was dilated and straight.  There are numerous varicosities in the right leg she is also having  hyperpigmentation of the ankle region. PULMONARY: Non-labored respirations ABDOMEN: Soft and non-tender with normal pitched bowel sounds.  MUSCULOSKELETAL: There are no major deformities or cyanosis. NEUROLOGIC: No focal weakness or paresthesias are detected. SKIN: See photo below. PSYCHIATRIC: The patient has a normal affect.  STUDIES:   I have reviewed the following reflux study: Venous Reflux Times  +--------------+---------+------+-----------+------------+--------+  RIGHT        Reflux NoRefluxReflux TimeDiameter cmsComments                          Yes                                   +--------------+---------+------+-----------+------------+--------+  CFV                    yes   >1 second                       +--------------+---------+------+-----------+------------+--------+  FV mid                  yes   >1 second                       +--------------+---------+------+-----------+------------+--------+  Popliteal    no                                              +--------------+---------+------+-----------+------------+--------+  GSV at [facility]              yes    >500 ms      0.89              +--------------+---------+------+-----------+------------+--------+  GSV prox thigh          yes    >500 ms      0.59              +--------------+---------+------+-----------+------------+--------+  GSV mid thigh           yes    >500 ms      0.50              +--------------+---------+------+-----------+------------+--------+  GSV dist thigh          yes    >500 ms      0.61              +--------------+---------+------+-----------+------------+--------+  GSV at knee             yes    >500 ms      0.43              +--------------+---------+------+-----------+------------+--------+  GSV prox calf no                            0.28               +--------------+---------+------+-----------+------------+--------+  SSV Pop Fossa no                            0.24              +--------------+---------+------+-----------+------------+--------+   MEDICAL ISSUES:   CEAP class IV: right leg: The patient has tried compression socks, exercise and elevation is still having difficulty with leg swelling that is more bothersome at the end of the day.  She is also developing skin changes in the ankle region and hyperpigmentation.  She has significant axial reflux in the right saphenous vein as well as several prominent varicosities.  I have recommended that we proceed with right saphenous vein ablation beginning at the knee.  She would need approximately 10-20 stabs to take care of the varicosities and then an additional 3 vials of sclerotherapy.  I did see her in conjunction with Harriett Sine today who agrees.  We will work on Therapist, occupational to get her scheduled.  She has previously undergone ligation of the left saphenous vein.  She has developed varicosities on the left.  She will need reflux examination of the left leg in the future.  Charlena Cross, MD, FACS [facility] [phone] Pager [phone]

## 2023-01-19 ENCOUNTER — Other Ambulatory Visit: Payer: Self-pay | Admitting: *Deleted

## 2023-02-10 ENCOUNTER — Other Ambulatory Visit: Payer: Self-pay | Admitting: *Deleted

## 2023-02-10 MED ORDER — LORAZEPAM 1 MG PO TABS
ORAL_TABLET | ORAL | 0 refills | Status: DC
Start: 2023-02-10 — End: 2023-02-19

## 2023-02-15 DIAGNOSIS — H353124 Nonexudative age-related macular degeneration, left eye, advanced atrophic with subfoveal involvement: Secondary | ICD-10-CM | POA: Diagnosis not present

## 2023-02-15 DIAGNOSIS — H04123 Dry eye syndrome of bilateral lacrimal glands: Secondary | ICD-10-CM | POA: Diagnosis not present

## 2023-02-15 DIAGNOSIS — H353114 Nonexudative age-related macular degeneration, right eye, advanced atrophic with subfoveal involvement: Secondary | ICD-10-CM | POA: Diagnosis not present

## 2023-02-19 ENCOUNTER — Encounter: Payer: Self-pay | Admitting: Surgery

## 2023-02-19 ENCOUNTER — Ambulatory Visit (INDEPENDENT_AMBULATORY_CARE_PROVIDER_SITE_OTHER): Payer: Medicare Other | Admitting: Surgery

## 2023-02-19 VITALS — BP 99/57 | HR 92 | Temp 97.8°F | Resp 16 | Ht 65.0 in | Wt 208.0 lb

## 2023-02-19 DIAGNOSIS — I83891 Varicose veins of right lower extremities with other complications: Secondary | ICD-10-CM | POA: Diagnosis not present

## 2023-02-19 DIAGNOSIS — I83892 Varicose veins of left lower extremities with other complications: Secondary | ICD-10-CM

## 2023-02-19 HISTORY — PX: ENDOVENOUS ABLATION SAPHENOUS VEIN W/ LASER: SUR449

## 2023-02-19 NOTE — Progress Notes (Signed)
     Laser Ablation Procedure    Date: 02/19/2023   Heather Chavez DOB:1945/11/07  Consent signed: Yes      Surgeon: Dr. Coral Else  Procedure: Laser Ablation: right Greater Saphenous Vein  BP (!) 99/57 (BP Location: Left Arm, Patient Position: Sitting, Cuff Size: Large)   Pulse 92   Temp 97.8 F (36.6 C) (Temporal)   Resp 16   Ht 5\' 5"  (1.651 m)   Wt 208 lb (94.3 kg)   SpO2 97%   BMI 34.61 kg/m   Tumescent Anesthesia: 600 cc 0.9% NaCl with 50 cc Lidocaine HCL 1%  and 15 cc 8.4% NaHCO3  Local Anesthesia: 6 cc Lidocaine HCL and NaHCO3 (ratio 2:1)  7 watts continuous mode     Total energy: 1602    Total time: 228 sec Treatment Length 30  Laser Fiber Ref. # [id number]      Lot # [id number]   Stab Phlebectomy: 10-20 Sites: Calf  Patient tolerated procedure well  Notes: All staff members wore facial masks. Patient took Ativan 1mg  @ 8am and Ativan 0.5mg  @ 8:30am.  Description of Procedure:  After marking the course of the secondary varicosities, the patient was placed on the operating table in the prone position, and the right leg was prepped and draped in sterile fashion.   Local anesthetic was administered and under ultrasound guidance the saphenous vein was accessed with a micro needle and guide wire; then the mirco puncture sheath was placed.  A guide wire was inserted saphenofemoral junction , followed by a 5 french sheath.  The position of the sheath and then the laser fiber below the junction was confirmed using the ultrasound.  Tumescent anesthesia was administered along the course of the saphenous vein using ultrasound guidance. The patient was placed in Trendelenburg position and protective laser glasses were placed on patient and staff, and the laser was fired at 7 watts continuous mode for a total of 1602 joules. For stab phlebectomies, local anesthetic was administered at the previously marked varicosities, and tumescent anesthesia was administered around the  vessels.  Ten to 20 stab wounds were made using the tip of an 11 blade. And using the vein hook, the phlebectomies were performed using a hemostat to avulse the varicosities.  Adequate hemostasis was achieved.   Steri strips were applied to the stab wounds and ABD pads and thigh high compression stockings were applied.  Ace wrap bandages were applied over the phlebectomy sites and at the top of the saphenofemoral junction. Blood loss was less than 15 cc.  Discharge instructions reviewed with patient and hardcopy of discharge instructions given to patient to take home. The patient was taken out of the operating room via wheelchair having tolerated the procedure well.

## 2023-02-19 NOTE — Progress Notes (Signed)
the patient was placed on the operating table in the supine position.

## 2023-02-23 ENCOUNTER — Other Ambulatory Visit: Payer: Self-pay | Admitting: *Deleted

## 2023-02-23 DIAGNOSIS — I872 Venous insufficiency (chronic) (peripheral): Secondary | ICD-10-CM

## 2023-02-23 DIAGNOSIS — I83892 Varicose veins of left lower extremities with other complications: Secondary | ICD-10-CM

## 2023-02-23 DIAGNOSIS — J40 Bronchitis, not specified as acute or chronic: Secondary | ICD-10-CM | POA: Diagnosis not present

## 2023-02-23 DIAGNOSIS — J329 Chronic sinusitis, unspecified: Secondary | ICD-10-CM | POA: Diagnosis not present

## 2023-03-05 ENCOUNTER — Ambulatory Visit (HOSPITAL_COMMUNITY)
Admission: RE | Admit: 2023-03-05 | Discharge: 2023-03-05 | Disposition: A | Discharge status: Home / Self Care | Payer: Medicare Other | Source: Ambulatory Visit | Attending: Surgery | Admitting: Surgery

## 2023-03-05 ENCOUNTER — Encounter: Payer: Self-pay | Admitting: Surgery

## 2023-03-05 ENCOUNTER — Ambulatory Visit (INDEPENDENT_AMBULATORY_CARE_PROVIDER_SITE_OTHER): Payer: Medicare Other | Admitting: Surgery

## 2023-03-05 VITALS — BP 117/70 | HR 86 | Temp 97.9°F | Wt 208.0 lb

## 2023-03-05 DIAGNOSIS — I83892 Varicose veins of left lower extremities with other complications: Secondary | ICD-10-CM | POA: Diagnosis not present

## 2023-03-05 DIAGNOSIS — I872 Venous insufficiency (chronic) (peripheral): Secondary | ICD-10-CM | POA: Diagnosis not present

## 2023-03-05 NOTE — Progress Notes (Signed)
Patient name: Heather Chavez MRN: [id number] DOB: 26-Jul-1945 Sex: female  REASON FOR VISIT:     Post op  HISTORY OF PRESENT ILLNESS:   Heather Chavez is a 77 y.o. female with class IV chronic venous insufficiency on the right leg.  She has a history of ligation of the left saphenous vein on 02/19/2023 she underwent laser ablation of the right saphenous vein with 10-20 stabs.  She is back today for follow-up.  She is still complaining of soreness and aching in her right leg  CURRENT MEDICATIONS:    Current Outpatient Medications  Medication Sig Dispense Refill   albuterol (VENTOLIN HFA) 108 (90 Base) MCG/ACT inhaler Inhale 2 puffs into the lungs every 4 (four) hours as needed for wheezing.     baclofen (LIORESAL) 10 MG tablet Take 10 mg by mouth 3 (three) times daily.     Biotin 41324 MCG TABS Take 10,000 mcg by mouth daily.     Boswellia-Glucosamine-Vit D (OSTEO BI-FLEX ONE PER DAY) TABS Take by mouth.     fexofenadine (ALLEGRA) 180 MG tablet Take 180 mg by mouth.     fluticasone (FLONASE) 50 MCG/ACT nasal spray Place 1 spray into both nostrils daily.     furosemide (LASIX) 20 MG tablet Take 20 mg by mouth as needed for fluid or edema.     gabapentin (NEURONTIN) 400 MG capsule Take 400 mg by mouth 2 (two) times daily.     LEXAPRO 10 MG tablet      losartan-hydrochlorothiazide (HYZAAR) 50-12.5 MG tablet Take 1 tablet by mouth daily.     metFORMIN (GLUCOPHAGE) 500 MG tablet Take 500 mg by mouth daily.     metoprolol tartrate (LOPRESSOR) 25 MG tablet Take 25 mg by mouth 2 (two) times daily.     montelukast (SINGULAIR) 10 MG tablet Take 10 mg by mouth at bedtime.     Multiple Vitamins-Minerals (PRESERVISION AREDS 2 PO) Take by mouth.     nitroGLYCERIN (NITROSTAT) 0.4 MG SL tablet Place 0.4 mg under the tongue every 5 (five) minutes as needed for chest pain.     omega-3 fish oil (MAXEPA) 1000 MG CAPS capsule Take 1,000 mg by mouth daily.      pantoprazole (PROTONIX) 40 MG tablet Take 1 tablet (40 mg total) by mouth daily. Call [phone] to schedule an office visit for more refills 90 tablet 0   vitamin E 180 MG (400 UNITS) capsule Take 400 Units by mouth daily.     WIXELA INHUB 250-50 MCG/ACT AEPB INHALE 1 PUFF INTO THE LUNGS IN THE MORNING AND AT BEDTIME. 180 each 3   zinc gluconate 50 MG tablet Take 50 mg by mouth daily.     No current facility-administered medications for this visit.    REVIEW OF SYSTEMS:   [X]  denotes positive finding, [ ]  denotes negative finding Cardiac  Comments:  Chest pain or chest pressure:    Shortness of breath upon exertion:    Short of breath when lying flat:    Irregular heart rhythm:    Constitutional    Fever or chills:      PHYSICAL EXAM:   Vitals:   03/05/23 0926  BP: 117/70  Pulse: 86  Temp: 97.9 F (36.6 C)  TempSrc: Temporal  SpO2: 98%  Weight: 208 lb (94.3 kg)    GENERAL: The patient is a well-nourished female, in no acute distress. The vital signs are documented above. CARDIOVASCULAR: There is a regular rate and rhythm. PULMONARY: Non-labored respirations  Incisions are healing nicely  STUDIES:   Reflux study: Right:  - No evidence of deep vein thrombosis seen in the right lower extremity.  - Occluded great saphenous vein from the proximal thigh to the knee,  consistent with recent venous ablation.   MEDICAL ISSUES:   Status post right leg laser ablation with stab phlebectomy: She is recovering as expected.  Ultrasound shows successful closure of the vein.    She has had stripping of the left leg.  She has developed varicosities over the area and would like to have these addressed.  Before doing so, I want to get a ultrasound to evaluate her left leg for reflux.  She will get that in 6 weeks and return to PA clinic.  If there are options for me to intervene I would like to see the patient in the PA clinic that day.  Charlena Cross, MD, FACS [facility] [phone] Pager [phone]

## 2023-03-15 ENCOUNTER — Other Ambulatory Visit: Payer: Self-pay | Admitting: Cardiology

## 2023-03-15 DIAGNOSIS — I209 Angina pectoris, unspecified: Secondary | ICD-10-CM

## 2023-03-18 ENCOUNTER — Other Ambulatory Visit: Payer: Self-pay

## 2023-03-18 DIAGNOSIS — I872 Venous insufficiency (chronic) (peripheral): Secondary | ICD-10-CM

## 2023-03-22 DIAGNOSIS — J40 Bronchitis, not specified as acute or chronic: Secondary | ICD-10-CM | POA: Diagnosis not present

## 2023-03-22 DIAGNOSIS — J329 Chronic sinusitis, unspecified: Secondary | ICD-10-CM | POA: Diagnosis not present

## 2023-04-05 ENCOUNTER — Ambulatory Visit (HOSPITAL_COMMUNITY)
Admission: RE | Admit: 2023-04-05 | Discharge: 2023-04-05 | Disposition: A | Discharge status: Home / Self Care | Payer: Medicare Other | Source: Ambulatory Visit | Attending: Surgery | Admitting: Surgery

## 2023-04-05 DIAGNOSIS — I872 Venous insufficiency (chronic) (peripheral): Secondary | ICD-10-CM | POA: Insufficient documentation

## 2023-04-06 DIAGNOSIS — H353124 Nonexudative age-related macular degeneration, left eye, advanced atrophic with subfoveal involvement: Secondary | ICD-10-CM | POA: Diagnosis not present

## 2023-04-06 DIAGNOSIS — H353114 Nonexudative age-related macular degeneration, right eye, advanced atrophic with subfoveal involvement: Secondary | ICD-10-CM | POA: Diagnosis not present

## 2023-04-06 DIAGNOSIS — G4733 Obstructive sleep apnea (adult) (pediatric): Secondary | ICD-10-CM | POA: Diagnosis not present

## 2023-04-06 DIAGNOSIS — H04123 Dry eye syndrome of bilateral lacrimal glands: Secondary | ICD-10-CM | POA: Diagnosis not present

## 2023-04-12 ENCOUNTER — Ambulatory Visit (INDEPENDENT_AMBULATORY_CARE_PROVIDER_SITE_OTHER): Payer: Medicare Other | Admitting: Physician Assistant

## 2023-04-12 VITALS — BP 104/66 | HR 84 | Temp 98.0°F | Resp 20 | Ht 65.0 in | Wt 207.7 lb

## 2023-04-12 DIAGNOSIS — I872 Venous insufficiency (chronic) (peripheral): Secondary | ICD-10-CM

## 2023-04-12 DIAGNOSIS — I8312 Varicose veins of left lower extremity with inflammation: Secondary | ICD-10-CM | POA: Diagnosis not present

## 2023-04-12 NOTE — Progress Notes (Signed)
CC:  F/u for surgery  HPI:  This is a 78 y.o. female who is s/p Laser Ablation: right Greater Saphenous Vein  02/19/2023  She has developed varicosities over the area and would like to have these addressed.   She has noticed increased pain, edema surrounding varicose veins on her left LE.   She has thigh high compression and uses elevation with exercise to promote good vein health and prevent skin damage.   She is here for follow up s/p right LE laser ablation and exam with studies of the left LE.    She denies claudication, rest pain or non healing wounds.     Allergies  Allergen Reactions   Codeine Nausea And Vomiting and Other (See Comments)    Other reaction(s): Other (see comments), Vomiting   Nexlizet [Bempedoic Acid-Ezetimibe] Other (See Comments)   Pitavastatin Other (See Comments)    Leg cramps  Other Reaction(s): Other (See Comments)  Leg cramps    Leg cramps   Rosuvastatin Other (See Comments)    Muscle pain    Current Outpatient Medications  Medication Sig Dispense Refill   albuterol (VENTOLIN HFA) 108 (90 Base) MCG/ACT inhaler Inhale 2 puffs into the lungs every 4 (four) hours as needed for wheezing.     baclofen (LIORESAL) 10 MG tablet Take 10 mg by mouth 3 (three) times daily.     Biotin 16109 MCG TABS Take 10,000 mcg by mouth daily.     Boswellia-Glucosamine-Vit D (OSTEO BI-FLEX ONE PER DAY) TABS Take by mouth.     fexofenadine (ALLEGRA) 180 MG tablet Take 180 mg by mouth.     fluticasone (FLONASE) 50 MCG/ACT nasal spray Place 1 spray into both nostrils daily.     furosemide (LASIX) 20 MG tablet Take 20 mg by mouth as needed for fluid or edema.     gabapentin (NEURONTIN) 400 MG capsule Take 400 mg by mouth 2 (two) times daily.     LEXAPRO 10 MG tablet      losartan-hydrochlorothiazide (HYZAAR) 50-12.5 MG tablet Take 1 tablet by mouth daily.     metFORMIN (GLUCOPHAGE) 500 MG tablet Take 500 mg by mouth daily.     metoprolol tartrate (LOPRESSOR) 25 MG tablet  Take 25 mg by mouth 2 (two) times daily.     montelukast (SINGULAIR) 10 MG tablet Take 10 mg by mouth at bedtime.     Multiple Vitamins-Minerals (PRESERVISION AREDS 2 PO) Take by mouth.     nitroGLYCERIN (NITROSTAT) 0.4 MG SL tablet Place 0.4 mg under the tongue every 5 (five) minutes as needed for chest pain.     omega-3 fish oil (MAXEPA) 1000 MG CAPS capsule Take 1,000 mg by mouth daily.     pantoprazole (PROTONIX) 40 MG tablet Take 1 tablet (40 mg total) by mouth daily. Call [phone] to schedule an office visit for more refills 90 tablet 0   vitamin E 180 MG (400 UNITS) capsule Take 400 Units by mouth daily.     WIXELA INHUB 250-50 MCG/ACT AEPB INHALE 1 PUFF INTO THE LUNGS IN THE MORNING AND AT BEDTIME. 180 each 3   zinc gluconate 50 MG tablet Take 50 mg by mouth daily.     No current facility-administered medications for this visit.     ROS:  See HPI  Physical Exam:       Incision:  right leg without complaints, no apparent edema today on exam Extremities:  left thigh and medial lower leg with varicose veins,  minimal edema, tenderness to palpation  Doppler signals DP/PT/Peroneal B LE Neuro: sensation grossly intact, known mild peripheral neuropathy Abdomen:  Soft, positive BS Lungs: non labored breathing   Assessment/Plan:  This is a 78 y.o. female who is s/p:right leg laser ablation with stab phlebectomy.  The last visit duplex demonstrated closure of the GSV on the right.  The left venous reflux duplex shows Reflux at the [facility] with enlarged GSV with reflux.  The vein size is > 0.4 cm.    Dr. Myra Gianotti brought over the duplex machine and examined her left leg.  We will plan on her returning for sclero therapy that was previously approved and once this is completed, if she still has pain, and edema surrounding her varicose veins on the left LE she will f/u with DR. Brabham to discuss possible varicose vein stripping on the left.     -She was seen in conjunction with Dr.  Myra Gianotti today in the office.    Heather Pigeon PA-C [facility] [phone]   Clinic MD:  Myra Gianotti

## 2023-04-14 ENCOUNTER — Other Ambulatory Visit: Payer: Self-pay | Admitting: Vascular Surgery

## 2023-04-14 DIAGNOSIS — Z1231 Encounter for screening mammogram for malignant neoplasm of breast: Secondary | ICD-10-CM

## 2023-05-07 ENCOUNTER — Ambulatory Visit (INDEPENDENT_AMBULATORY_CARE_PROVIDER_SITE_OTHER): Payer: Medicare Other

## 2023-05-07 DIAGNOSIS — I83892 Varicose veins of left lower extremities with other complications: Secondary | ICD-10-CM

## 2023-05-07 DIAGNOSIS — I8393 Asymptomatic varicose veins of bilateral lower extremities: Secondary | ICD-10-CM

## 2023-05-07 NOTE — Progress Notes (Signed)
 Treated pt's reticular veins and spider veins in BLE with Asclera 1%, administered with a 27 gauge butterfly needle. Pt had a larger reticular vein in LLE upper thigh area that she has been wanting to treat and is interested in stab phlebectomies if sclerotherapy does not improve this. Pt tolerated well; easy access. A total of 3 vials of Asclera 1% (6 mL/60 mg) was used-4 mL/40 mg on LLE and 2 mL/20 mg of Asclera 1% on RLE. She was placed in 20-30 mm Hg pantyhose style compression. She does have a hard time getting these on/off but sounded like she understood the importance of wearing them for better success of treatment. She will call if she has any questions/concerns. Post treatment care instructions were provided on handout and verbally.

## 2023-05-19 DIAGNOSIS — H02833 Dermatochalasis of right eye, unspecified eyelid: Secondary | ICD-10-CM | POA: Diagnosis not present

## 2023-05-19 DIAGNOSIS — H04123 Dry eye syndrome of bilateral lacrimal glands: Secondary | ICD-10-CM | POA: Diagnosis not present

## 2023-05-19 DIAGNOSIS — H353134 Nonexudative age-related macular degeneration, bilateral, advanced atrophic with subfoveal involvement: Secondary | ICD-10-CM | POA: Diagnosis not present

## 2023-05-19 DIAGNOSIS — H02836 Dermatochalasis of left eye, unspecified eyelid: Secondary | ICD-10-CM | POA: Diagnosis not present

## 2023-06-15 DIAGNOSIS — M1711 Unilateral primary osteoarthritis, right knee: Secondary | ICD-10-CM | POA: Diagnosis not present

## 2023-06-16 DIAGNOSIS — E7800 Pure hypercholesterolemia, unspecified: Secondary | ICD-10-CM | POA: Diagnosis not present

## 2023-06-16 DIAGNOSIS — E1169 Type 2 diabetes mellitus with other specified complication: Secondary | ICD-10-CM | POA: Diagnosis not present

## 2023-06-16 DIAGNOSIS — Z6833 Body mass index (BMI) 33.0-33.9, adult: Secondary | ICD-10-CM | POA: Diagnosis not present

## 2023-06-16 DIAGNOSIS — I10 Essential (primary) hypertension: Secondary | ICD-10-CM | POA: Diagnosis not present

## 2023-07-05 DIAGNOSIS — H353114 Nonexudative age-related macular degeneration, right eye, advanced atrophic with subfoveal involvement: Secondary | ICD-10-CM | POA: Diagnosis not present

## 2023-07-05 DIAGNOSIS — H353124 Nonexudative age-related macular degeneration, left eye, advanced atrophic with subfoveal involvement: Secondary | ICD-10-CM | POA: Diagnosis not present

## 2023-07-05 DIAGNOSIS — H04123 Dry eye syndrome of bilateral lacrimal glands: Secondary | ICD-10-CM | POA: Diagnosis not present

## 2023-07-05 DIAGNOSIS — H02833 Dermatochalasis of right eye, unspecified eyelid: Secondary | ICD-10-CM | POA: Diagnosis not present

## 2023-07-05 DIAGNOSIS — H02836 Dermatochalasis of left eye, unspecified eyelid: Secondary | ICD-10-CM | POA: Diagnosis not present

## 2023-08-12 DIAGNOSIS — L578 Other skin changes due to chronic exposure to nonionizing radiation: Secondary | ICD-10-CM | POA: Diagnosis not present

## 2023-08-12 DIAGNOSIS — L570 Actinic keratosis: Secondary | ICD-10-CM | POA: Diagnosis not present

## 2023-08-12 DIAGNOSIS — L820 Inflamed seborrheic keratosis: Secondary | ICD-10-CM | POA: Diagnosis not present

## 2023-08-12 DIAGNOSIS — L814 Other melanin hyperpigmentation: Secondary | ICD-10-CM | POA: Diagnosis not present

## 2023-08-12 DIAGNOSIS — D485 Neoplasm of uncertain behavior of skin: Secondary | ICD-10-CM | POA: Diagnosis not present

## 2023-08-19 DIAGNOSIS — H353134 Nonexudative age-related macular degeneration, bilateral, advanced atrophic with subfoveal involvement: Secondary | ICD-10-CM | POA: Diagnosis not present

## 2023-08-19 DIAGNOSIS — H02836 Dermatochalasis of left eye, unspecified eyelid: Secondary | ICD-10-CM | POA: Diagnosis not present

## 2023-08-19 DIAGNOSIS — H04123 Dry eye syndrome of bilateral lacrimal glands: Secondary | ICD-10-CM | POA: Diagnosis not present

## 2023-08-19 DIAGNOSIS — H02833 Dermatochalasis of right eye, unspecified eyelid: Secondary | ICD-10-CM | POA: Diagnosis not present

## 2023-08-20 DIAGNOSIS — C44729 Squamous cell carcinoma of skin of left lower limb, including hip: Secondary | ICD-10-CM | POA: Diagnosis not present

## 2023-08-26 DIAGNOSIS — C44519 Basal cell carcinoma of skin of other part of trunk: Secondary | ICD-10-CM | POA: Diagnosis not present

## 2023-09-14 DIAGNOSIS — H7401 Tympanosclerosis, right ear: Secondary | ICD-10-CM | POA: Diagnosis not present

## 2023-09-14 DIAGNOSIS — H9191 Unspecified hearing loss, right ear: Secondary | ICD-10-CM | POA: Diagnosis not present

## 2023-09-14 DIAGNOSIS — H9319 Tinnitus, unspecified ear: Secondary | ICD-10-CM | POA: Diagnosis not present

## 2023-09-29 DIAGNOSIS — H02833 Dermatochalasis of right eye, unspecified eyelid: Secondary | ICD-10-CM | POA: Diagnosis not present

## 2023-09-29 DIAGNOSIS — H04123 Dry eye syndrome of bilateral lacrimal glands: Secondary | ICD-10-CM | POA: Diagnosis not present

## 2023-09-29 DIAGNOSIS — H353124 Nonexudative age-related macular degeneration, left eye, advanced atrophic with subfoveal involvement: Secondary | ICD-10-CM | POA: Diagnosis not present

## 2023-09-29 DIAGNOSIS — H02836 Dermatochalasis of left eye, unspecified eyelid: Secondary | ICD-10-CM | POA: Diagnosis not present

## 2023-09-29 DIAGNOSIS — H353114 Nonexudative age-related macular degeneration, right eye, advanced atrophic with subfoveal involvement: Secondary | ICD-10-CM | POA: Diagnosis not present

## 2023-10-01 DIAGNOSIS — C44519 Basal cell carcinoma of skin of other part of trunk: Secondary | ICD-10-CM | POA: Diagnosis not present

## 2023-10-01 DIAGNOSIS — L700 Acne vulgaris: Secondary | ICD-10-CM | POA: Diagnosis not present

## 2023-10-01 DIAGNOSIS — D485 Neoplasm of uncertain behavior of skin: Secondary | ICD-10-CM | POA: Diagnosis not present

## 2023-10-06 ENCOUNTER — Other Ambulatory Visit: Payer: Self-pay | Admitting: Adult Health

## 2023-10-06 NOTE — Telephone Encounter (Signed)
 Copied from CRM [id number]. Topic: Clinical - Medication Refill >> Oct 06, 2023 10:54 AM [name] wrote: Medication:  WIXELA INHUB 250-50 MCG/ACT AEPB  Has the patient contacted their pharmacy? Yes - No more refills and states has sent request but received no response. Appears not requests have been received.   This is the patient's preferred pharmacy:  [facility] [address], [place] - [address] [place] [address] Phone: [phone] Fax: [phone]  Is this the correct pharmacy for this prescription? Yes If no, delete pharmacy and type the correct one.   Has the prescription been filled recently? No  Is the patient out of the medication? Yes  Has the patient been seen for an appointment in the last year OR does the patient have an upcoming appointment? Yes  Can we respond through MyChart? Yes  Agent: Please be advised that Rx refills may take up to 3 business days. We ask that you follow-up with your pharmacy.

## 2023-10-07 ENCOUNTER — Ambulatory Visit
Admission: RE | Admit: 2023-10-07 | Discharge: 2023-10-07 | Disposition: A | Discharge status: Home / Self Care | Payer: BLUE CROSS/BLUE SHIELD | Source: Ambulatory Visit | Attending: Vascular Surgery | Admitting: Vascular Surgery

## 2023-10-07 ENCOUNTER — Other Ambulatory Visit: Payer: Self-pay | Admitting: Family Medicine

## 2023-10-07 DIAGNOSIS — N6342 Unspecified lump in left breast, subareolar: Secondary | ICD-10-CM

## 2023-10-07 DIAGNOSIS — N644 Mastodynia: Secondary | ICD-10-CM

## 2023-10-07 DIAGNOSIS — Z1231 Encounter for screening mammogram for malignant neoplasm of breast: Secondary | ICD-10-CM

## 2023-10-12 ENCOUNTER — Telehealth: Payer: Self-pay

## 2023-10-12 MED ORDER — FLUTICASONE-SALMETEROL 250-50 MCG/ACT IN AEPB: 1.0000 | INHALATION_SPRAY | Freq: Two times a day (BID) | RESPIRATORY_TRACT | 0 refills | Status: DC

## 2023-10-12 NOTE — Telephone Encounter (Signed)
 I called and spoke with the pt  She is needing refill on Wixela  She is aware to keep upcoming ov with MW  I gave 1 90 day supply for now  Nothing further needed

## 2023-10-12 NOTE — Telephone Encounter (Signed)
 PT has been out of medication for last 3 weeks. Pts APT is in sep to get the Rx refill. Can she get a refill to last to the Sep APT? 11/12/23 Dr.Wert

## 2023-10-14 DIAGNOSIS — C44722 Squamous cell carcinoma of skin of right lower limb, including hip: Secondary | ICD-10-CM | POA: Diagnosis not present

## 2023-10-20 ENCOUNTER — Ambulatory Visit
Admission: RE | Admit: 2023-10-20 | Discharge: 2023-10-20 | Disposition: A | Discharge status: Home / Self Care | Source: Ambulatory Visit | Attending: Family Medicine | Admitting: Family Medicine

## 2023-10-20 DIAGNOSIS — N6342 Unspecified lump in left breast, subareolar: Secondary | ICD-10-CM

## 2023-10-20 DIAGNOSIS — N644 Mastodynia: Secondary | ICD-10-CM

## 2023-10-20 DIAGNOSIS — R920 Mammographic microcalcification found on diagnostic imaging of breast: Secondary | ICD-10-CM | POA: Diagnosis not present

## 2023-10-20 DIAGNOSIS — N6489 Other specified disorders of breast: Secondary | ICD-10-CM | POA: Diagnosis not present

## 2023-10-21 DIAGNOSIS — L02415 Cutaneous abscess of right lower limb: Secondary | ICD-10-CM | POA: Diagnosis not present

## 2023-11-02 DIAGNOSIS — Z853 Personal history of malignant neoplasm of breast: Secondary | ICD-10-CM | POA: Diagnosis not present

## 2023-11-02 DIAGNOSIS — Z170 Estrogen receptor positive status [ER+]: Secondary | ICD-10-CM | POA: Diagnosis not present

## 2023-11-02 DIAGNOSIS — C50212 Malignant neoplasm of upper-inner quadrant of left female breast: Secondary | ICD-10-CM | POA: Diagnosis not present

## 2023-11-06 DIAGNOSIS — L239 Allergic contact dermatitis, unspecified cause: Secondary | ICD-10-CM | POA: Diagnosis not present

## 2023-11-10 DIAGNOSIS — M1711 Unilateral primary osteoarthritis, right knee: Secondary | ICD-10-CM | POA: Diagnosis not present

## 2023-11-11 NOTE — Progress Notes (Unsigned)
 Heather Chavez, female    DOB: 16-Feb-1946   MRN: [id number]   Brief patient profile:  25 yowf   quit smoking in 1983   Heather Chavez(last 11/2021)  self  referred to pulmonary clinic 11/12/2023 for AB with doe since around 2017   Pulmonary testing:  PFT 06/20/20 >> FEV1 1.72 (75%),  0.84, TLC 4.05 (77%), DLCO 62%   Chest Imaging:  CT chest 06/01/18 >> coronary calcification, emphysema, scar with volume loss RML and RLL CT chest 05/20/20 >> scarring in RML and lingula Sniff test 07/01/20 >> elevated Rt diaphragm, but normal movement   Sleep Tests:  HST 06/21/20 >> AHI 21.4, SpO2 71% Auto CPAP 11/13/21 to 11/30/21 >> used on 16 of 18 nights with average 6 hrs 55 min.  Average AHI 3.3 with median CPAP 11 and 95 th percentile CPAP 14 cm H2O   Cardiac Tests:  Echo 05/21/20 >> EF greater than 70%    History of Present Illness  11/12/2023  Pulmonary/ 1st office eval/Heather Chavez much worse off wixella 250  Chief Complaint  Patient presents with   Consult    Transfer of care  Dyspnea:  walks dog  up to 300 ft and has to stop flat surface but also limited by back and legs Cough: some in am min muocoid Sleep: flat bed with one pillow / no longer on cpap  SABA use: just with flares of bronchitis  02 ldz:wnwz     No obvious day to day or daytime pattern/variability or assoc excess/ purulent sputum or mucus plugs or hemoptysis or cp or chest tightness, subjective wheeze or overt sinus or hb symptoms.    Also denies any obvious fluctuation of symptoms with weather or environmental changes or other aggravating or alleviating factors except as outlined above   No unusual exposure hx or h/o childhood pna/ asthma or knowledge of premature birth.  Current Allergies, Complete Past Medical History, Past Surgical History, Family History, and Social History were reviewed in [employer] record.  ROS  The following are not active complaints unless bolded Hoarseness, sore throat, dysphagia,  dental problems, itching, sneezing,  nasal congestion or discharge of excess mucus or purulent secretions, ear ache,   fever, chills, sweats, unintended wt loss or wt gain, classically pleuritic or exertional cp,  orthopnea pnd or arm/hand swelling  or leg swelling, presyncope, palpitations, abdominal pain, anorexia, nausea, vomiting, diarrhea  or change in bowel habits or change in bladder habits, change in stools or change in urine, dysuria, hematuria,  rash, arthralgias, visual complaints, headache, numbness, weakness or ataxia or problems with walking or coordination,  change in mood or  memory.          Outpatient Medications Prior to Visit  Medication Sig Dispense Refill   albuterol (VENTOLIN HFA) 108 (90 Base) MCG/ACT inhaler Inhale 2 puffs into the lungs every 4 (four) hours as needed for wheezing.     Biotin 89999 MCG TABS Take 10,000 mcg by mouth daily.     Boswellia-Glucosamine-Vit D (OSTEO BI-FLEX ONE PER DAY) TABS Take by mouth.     fexofenadine (ALLEGRA) 180 MG tablet Take 180 mg by mouth.     fluticasone  (FLONASE) 50 MCG/ACT nasal spray Place 1 spray into both nostrils daily.     furosemide (LASIX) 20 MG tablet Take 20 mg by mouth as needed for fluid or edema.     gabapentin (NEURONTIN) 400 MG capsule Take 400 mg by mouth 2 (two) times daily.  LEXAPRO 10 MG tablet      losartan-hydrochlorothiazide (HYZAAR) 50-12.5 MG tablet Take 1 tablet by mouth daily.     metFORMIN (GLUCOPHAGE) 500 MG tablet Take 500 mg by mouth daily.     metoprolol  tartrate (LOPRESSOR ) 25 MG tablet Take 25 mg by mouth 2 (two) times daily.     montelukast (SINGULAIR) 10 MG tablet Take 10 mg by mouth at bedtime.     nitroGLYCERIN  (NITROSTAT ) 0.4 MG SL tablet Place 0.4 mg under the tongue every 5 (five) minutes as needed for chest pain.     pantoprazole  (PROTONIX ) 40 MG tablet Take 1 tablet (40 mg total) by mouth daily. Call [phone] to schedule an office visit for more refills 90 tablet 0   vitamin E 180  MG (400 UNITS) capsule Take 400 Units by mouth daily.     zinc gluconate 50 MG tablet Take 50 mg by mouth daily.     baclofen (LIORESAL) 10 MG tablet Take 10 mg by mouth 3 (three) times daily. (Patient not taking: Reported on 11/12/2023)     Multiple Vitamins-Minerals (PRESERVISION AREDS 2 PO) Take by mouth. (Patient not taking: Reported on 11/12/2023)     fluticasone -salmeterol (WIXELA INHUB) 250-50 MCG/ACT AEPB Inhale 1 puff into the lungs 2 (two) times daily. in the morning and at bedtime. 180 each 0   omega-3 fish oil (MAXEPA) 1000 MG CAPS capsule Take 1,000 mg by mouth daily. (Patient not taking: Reported on 11/12/2023)     No facility-administered medications prior to visit.    Past Medical History:  Diagnosis Date   Advanced nonexudative age-related macular degeneration of right eye with subfoveal involvement 09/21/2019   Advanced nonexudative age-related macular degeneration of right eye without subfoveal involvement 09/21/2019   Allergic rhinitis    Angina pectoris (HCC) 05/17/2020   Bilateral breast cancer (HCC) 11/23/2013   BMI 34.0-34.9,adult 09/25/2019   Breast cancer (HCC)    Bronchitis, chronic (HCC)    CAD (coronary artery disease)    Cancer (HCC)    Cardiac murmur 05/17/2020   Coronary artery calcification 05/17/2020   Degenerative retinal drusen of right eye 09/21/2019   Diabetes mellitus due to underlying condition with unspecified complications (HCC) 05/17/2020   Diverticulosis    Dry eyes 06/25/2016   Dyspnea    Emphysema/COPD (HCC)    Essential hypertension 05/17/2020   Estrogen receptor positive neoplasm 09/24/2015   Family history of malignant neoplasm of breast 11/23/2013   Family history of peritoneal cancer 11/23/2013   Fuchs' corneal dystrophy 11/03/2016   GERD (gastroesophageal reflux disease)    History of colon polyps    History of right breast cancer 09/19/2018   Hypertension    IBS (irritable bowel syndrome)    with D   Intermediate stage  nonexudative age-related macular degeneration of left eye 09/21/2019   Iritis 11/03/2016   Macular degeneration    Major depressive disorder    Malignant neoplasm of upper-inner quadrant of left breast in female, estrogen receptor positive (HCC) 09/01/2016   Mixed dyslipidemia 05/17/2020   Obesity    Obesity (BMI 35.0-39.9 without comorbidity) 05/17/2020   OSA on CPAP 10/29/2020   Diagnosed some 3 months prior after pneumonia episode, Dr. Shellia pulmonology   Peripheral vascular disease [facility])    Personal history of chemotherapy    Personal history of radiation therapy    Pseudophakia 09/21/2019   Right-sided chest wall pain    SVT (supraventricular tachycardia) (HCC) 05/23/2020   Varicose veins of left lower extremity with complications  08/11/2016      Objective:     BP 124/63   Pulse 70   Temp 98.1 F (36.7 C) (Oral)   Ht 5' 5 (1.651 m)   Wt 207 lb (93.9 kg)   SpO2 97%   BMI 34.45 kg/m   SpO2: 97 % RA   Pleasant amb wf nad    HEENT : Oropharynx  clear      Nasal turbinates nl    NECK :  without  apparent JVD/ palpable Nodes/TM    LUNGS: no acc muscle use,  Nl contour chest which is clear to A and P bilaterally without cough on insp or exp maneuvers   CV:  RRR  no s3 or murmur or increase in P2, and no edema   ABD:  soft and nontender   MS:  Gait nl   ext warm without deformities Or obvious joint restrictions  calf tenderness, cyanosis or clubbing    SKIN: warm and dry without lesions    NEURO:  alert, approp, nl sensorium with  no motor or cerebellar deficits apparent.       Assessment   Assessment & Plan Moderate persistent asthma without complication  Centrilobular emphysema (HCC)  Asthmatic bronchitis , chronic (HCC) - PFT 06/20/20 >> FEV1 1.72 (75%),  0.84, TLC 4.05 (77%), DLCO 62% - 11/12/2023   Walked on RA  x  3  lap(s) =  approx 750  ft  @ mod pace, stopped due to end of study  with lowest 02 sats 93%    She has not had wt gain or any obvious  reason for decline in ex tol and though she was worse off wixella/ back on it has improved her doe but not back to baseline which may just be a conditioning issue and not just undercontrolled asthma.   To tell the difference rec :  Re SABA :  I spent extra time with pt today reviewing appropriate use of albuterol for prn use on exertion with the following points: 1) saba is for relief of sob that does not improve by walking a slower pace or resting but rather if the pt does not improve after trying this first. 2) If the pt is convinced, as many are, that saba helps recover from activity faster then it's easy to tell if this is the case by re-challenging : ie stop, take the inhaler, then p 5 minutes try the exact same activity (intensity of workload) that just caused the symptoms and see if they are substantially diminished or not after saba 3) if there is an activity that reproducibly causes the symptoms, try the saba 15 min before the activity on alternate days   If in fact the saba really does help, then fine to continue to use it prn but advised may need to look closer at the maintenance regimen(wixella 250)  being used to achieve better control of airways disease with exertion.   Return for pfts with no inhalers on day of ov.    Each maintenance medication was reviewed in detail including emphasizing most importantly the difference between maintenance and prns and under what circumstances the prns are to be triggered using an action plan format where appropriate.  Total time for H and P, chart review, counseling, reviewing hfa/ dpi  device(s) , directly observing portions of ambulatory 02 saturation study/ and generating customized AVS unique to this office visit / same day charting = 42 min with pt new to me  AVS  Patient Instructions  Plan A = Automatic = Always=    Wixella 250 one first thing in am and one puff 12 hours late  Plan B = Backup (to supplement plan A, not  to replace it) Use your albuterol inhaler as a rescue medication to be used if you can't catch your breath by resting or slowing your pace  or doing a relaxed purse lip breathing pattern.  - The less you use it, the better it will work when you need it. - Ok to use the inhaler up to 2 puffs  every 4 hours if you must but call for appointment if use goes up over your usual need - Don't leave home without it !!  (think of it like the spare tire or starter fluid for your car)   Also  Ok to try albuterol 15 min before an activity (on alternating days)  that you know would usually make you short of breath and see if it makes any difference and if makes none then don't take albuterol after activity unless you can't catch your breath as this means it's the resting that helps, not the albuterol.   Please schedule a follow up visit in 3 months - bring inhalers with you  - don't take that day if possible          Ozell America, MD 11/12/2023

## 2023-11-12 ENCOUNTER — Ambulatory Visit (INDEPENDENT_AMBULATORY_CARE_PROVIDER_SITE_OTHER): Admitting: Internal Medicine

## 2023-11-12 ENCOUNTER — Encounter: Payer: Self-pay | Admitting: Internal Medicine

## 2023-11-12 VITALS — BP 124/63 | HR 70 | Temp 98.1°F | Ht 65.0 in | Wt 207.0 lb

## 2023-11-12 DIAGNOSIS — J432 Centrilobular emphysema: Secondary | ICD-10-CM

## 2023-11-12 DIAGNOSIS — J4540 Moderate persistent asthma, uncomplicated: Secondary | ICD-10-CM

## 2023-11-12 DIAGNOSIS — J4489 Other specified chronic obstructive pulmonary disease: Secondary | ICD-10-CM | POA: Diagnosis not present

## 2023-11-12 MED ORDER — FLUTICASONE-SALMETEROL 250-50 MCG/ACT IN AEPB: INHALATION_SPRAY | RESPIRATORY_TRACT | 0 refills | Status: DC

## 2023-11-12 NOTE — Assessment & Plan Note (Addendum)
-   PFT 06/20/20 >> FEV1 1.72 (75%),  0.84, TLC 4.05 (77%), DLCO 62% - 11/12/2023   Walked on RA  x  3  lap(s) =  approx 750  ft  @ mod pace, stopped due to end of study  with lowest 02 sats 93%    She has not had wt gain or any obvious reason for decline in ex tol and though she was worse off wixella/ back on it has improved her doe but not back to baseline which may just be a conditioning issue and not just undercontrolled asthma.   To tell the difference rec :  Re SABA :  I spent extra time with pt today reviewing appropriate use of albuterol for prn use on exertion with the following points: 1) saba is for relief of sob that does not improve by walking a slower pace or resting but rather if the pt does not improve after trying this first. 2) If the pt is convinced, as many are, that saba helps recover from activity faster then it's easy to tell if this is the case by re-challenging : ie stop, take the inhaler, then p 5 minutes try the exact same activity (intensity of workload) that just caused the symptoms and see if they are substantially diminished or not after saba 3) if there is an activity that reproducibly causes the symptoms, try the saba 15 min before the activity on alternate days   If in fact the saba really does help, then fine to continue to use it prn but advised may need to look closer at the maintenance regimen(wixella 250)  being used to achieve better control of airways disease with exertion.   Return for pfts with no inhalers on day of ov.    Each maintenance medication was reviewed in detail including emphasizing most importantly the difference between maintenance and prns and under what circumstances the prns are to be triggered using an action plan format where appropriate.  Total time for H and P, chart review, counseling, reviewing hfa/ dpi  device(s) , directly observing portions of ambulatory 02 saturation study/ and generating customized AVS unique to this office visit /  same day charting = 42 min with pt new to me

## 2023-11-12 NOTE — Patient Instructions (Signed)
 Plan A = Automatic = Always=    Wixella 250 one first thing in am and one puff 12 hours late  Plan B = Backup (to supplement plan A, not to replace it) Use your albuterol inhaler as a rescue medication to be used if you can't catch your breath by resting or slowing your pace  or doing a relaxed purse lip breathing pattern.  - The less you use it, the better it will work when you need it. - Ok to use the inhaler up to 2 puffs  every 4 hours if you must but call for appointment if use goes up over your usual need - Don't leave home without it !!  (think of it like the spare tire or starter fluid for your car)   Also  Ok to try albuterol 15 min before an activity (on alternating days)  that you know would usually make you short of breath and see if it makes any difference and if makes none then don't take albuterol after activity unless you can't catch your breath as this means it's the resting that helps, not the albuterol.   Please schedule a follow up visit in 3 months - bring inhalers with you  - don't take that day if possible

## 2023-11-15 DIAGNOSIS — E1169 Type 2 diabetes mellitus with other specified complication: Secondary | ICD-10-CM | POA: Diagnosis not present

## 2023-11-15 DIAGNOSIS — F331 Major depressive disorder, recurrent, moderate: Secondary | ICD-10-CM | POA: Diagnosis not present

## 2023-11-15 DIAGNOSIS — Z6833 Body mass index (BMI) 33.0-33.9, adult: Secondary | ICD-10-CM | POA: Diagnosis not present

## 2023-11-15 DIAGNOSIS — G629 Polyneuropathy, unspecified: Secondary | ICD-10-CM | POA: Diagnosis not present

## 2023-11-15 DIAGNOSIS — E7800 Pure hypercholesterolemia, unspecified: Secondary | ICD-10-CM | POA: Diagnosis not present

## 2023-11-15 DIAGNOSIS — M5410 Radiculopathy, site unspecified: Secondary | ICD-10-CM | POA: Diagnosis not present

## 2023-11-17 DIAGNOSIS — H02833 Dermatochalasis of right eye, unspecified eyelid: Secondary | ICD-10-CM | POA: Diagnosis not present

## 2023-11-17 DIAGNOSIS — H02836 Dermatochalasis of left eye, unspecified eyelid: Secondary | ICD-10-CM | POA: Diagnosis not present

## 2023-11-17 DIAGNOSIS — H353114 Nonexudative age-related macular degeneration, right eye, advanced atrophic with subfoveal involvement: Secondary | ICD-10-CM | POA: Diagnosis not present

## 2023-11-17 DIAGNOSIS — H04123 Dry eye syndrome of bilateral lacrimal glands: Secondary | ICD-10-CM | POA: Diagnosis not present

## 2023-11-17 DIAGNOSIS — H353124 Nonexudative age-related macular degeneration, left eye, advanced atrophic with subfoveal involvement: Secondary | ICD-10-CM | POA: Diagnosis not present

## 2023-12-04 DIAGNOSIS — J209 Acute bronchitis, unspecified: Secondary | ICD-10-CM | POA: Diagnosis not present

## 2023-12-04 DIAGNOSIS — R197 Diarrhea, unspecified: Secondary | ICD-10-CM | POA: Diagnosis not present

## 2023-12-04 DIAGNOSIS — I10 Essential (primary) hypertension: Secondary | ICD-10-CM | POA: Diagnosis not present

## 2023-12-04 DIAGNOSIS — J441 Chronic obstructive pulmonary disease with (acute) exacerbation: Secondary | ICD-10-CM | POA: Diagnosis not present

## 2023-12-04 DIAGNOSIS — M7989 Other specified soft tissue disorders: Secondary | ICD-10-CM | POA: Diagnosis not present

## 2023-12-04 DIAGNOSIS — R0682 Tachypnea, not elsewhere classified: Secondary | ICD-10-CM | POA: Diagnosis not present

## 2023-12-04 DIAGNOSIS — R110 Nausea: Secondary | ICD-10-CM | POA: Diagnosis not present

## 2023-12-04 DIAGNOSIS — R600 Localized edema: Secondary | ICD-10-CM | POA: Diagnosis not present

## 2023-12-04 DIAGNOSIS — Z7984 Long term (current) use of oral hypoglycemic drugs: Secondary | ICD-10-CM | POA: Diagnosis not present

## 2023-12-04 DIAGNOSIS — E119 Type 2 diabetes mellitus without complications: Secondary | ICD-10-CM | POA: Diagnosis not present

## 2023-12-08 DIAGNOSIS — J439 Emphysema, unspecified: Secondary | ICD-10-CM | POA: Diagnosis not present

## 2023-12-08 DIAGNOSIS — Z6834 Body mass index (BMI) 34.0-34.9, adult: Secondary | ICD-10-CM | POA: Diagnosis not present

## 2023-12-22 DIAGNOSIS — Z6833 Body mass index (BMI) 33.0-33.9, adult: Secondary | ICD-10-CM | POA: Diagnosis not present

## 2023-12-22 DIAGNOSIS — Z23 Encounter for immunization: Secondary | ICD-10-CM | POA: Diagnosis not present

## 2023-12-22 DIAGNOSIS — K5900 Constipation, unspecified: Secondary | ICD-10-CM | POA: Diagnosis not present

## 2023-12-22 DIAGNOSIS — R569 Unspecified convulsions: Secondary | ICD-10-CM | POA: Diagnosis not present

## 2023-12-27 ENCOUNTER — Encounter (HOSPITAL_COMMUNITY): Payer: Self-pay

## 2023-12-27 ENCOUNTER — Inpatient Hospital Stay: Admit: 2023-12-27 | Admitting: Student

## 2023-12-27 DIAGNOSIS — Z7984 Long term (current) use of oral hypoglycemic drugs: Secondary | ICD-10-CM | POA: Diagnosis not present

## 2023-12-27 DIAGNOSIS — R000 Tachycardia, unspecified: Secondary | ICD-10-CM | POA: Diagnosis not present

## 2023-12-27 DIAGNOSIS — R9431 Abnormal electrocardiogram [ECG] [EKG]: Secondary | ICD-10-CM | POA: Diagnosis not present

## 2023-12-27 DIAGNOSIS — C799 Secondary malignant neoplasm of unspecified site: Secondary | ICD-10-CM | POA: Diagnosis not present

## 2023-12-27 DIAGNOSIS — E1142 Type 2 diabetes mellitus with diabetic polyneuropathy: Secondary | ICD-10-CM | POA: Diagnosis not present

## 2023-12-27 DIAGNOSIS — R0902 Hypoxemia: Secondary | ICD-10-CM | POA: Diagnosis not present

## 2023-12-27 DIAGNOSIS — Z79891 Long term (current) use of opiate analgesic: Secondary | ICD-10-CM | POA: Diagnosis not present

## 2023-12-27 DIAGNOSIS — E871 Hypo-osmolality and hyponatremia: Secondary | ICD-10-CM | POA: Diagnosis not present

## 2023-12-27 DIAGNOSIS — Z87891 Personal history of nicotine dependence: Secondary | ICD-10-CM | POA: Diagnosis not present

## 2023-12-27 DIAGNOSIS — Z853 Personal history of malignant neoplasm of breast: Secondary | ICD-10-CM | POA: Diagnosis not present

## 2023-12-27 DIAGNOSIS — E785 Hyperlipidemia, unspecified: Secondary | ICD-10-CM | POA: Diagnosis not present

## 2023-12-27 DIAGNOSIS — E876 Hypokalemia: Secondary | ICD-10-CM | POA: Diagnosis not present

## 2023-12-27 DIAGNOSIS — Z881 Allergy status to other antibiotic agents status: Secondary | ICD-10-CM | POA: Diagnosis not present

## 2023-12-27 DIAGNOSIS — J90 Pleural effusion, not elsewhere classified: Secondary | ICD-10-CM | POA: Diagnosis not present

## 2023-12-27 DIAGNOSIS — J441 Chronic obstructive pulmonary disease with (acute) exacerbation: Secondary | ICD-10-CM | POA: Diagnosis not present

## 2023-12-27 DIAGNOSIS — R591 Generalized enlarged lymph nodes: Secondary | ICD-10-CM | POA: Diagnosis not present

## 2023-12-27 DIAGNOSIS — I10 Essential (primary) hypertension: Secondary | ICD-10-CM | POA: Diagnosis not present

## 2023-12-27 DIAGNOSIS — Z885 Allergy status to narcotic agent status: Secondary | ICD-10-CM | POA: Diagnosis not present

## 2023-12-27 DIAGNOSIS — J984 Other disorders of lung: Secondary | ICD-10-CM | POA: Diagnosis not present

## 2023-12-27 DIAGNOSIS — R0600 Dyspnea, unspecified: Secondary | ICD-10-CM | POA: Diagnosis not present

## 2023-12-27 DIAGNOSIS — D509 Iron deficiency anemia, unspecified: Secondary | ICD-10-CM | POA: Diagnosis not present

## 2023-12-27 DIAGNOSIS — F32A Depression, unspecified: Secondary | ICD-10-CM | POA: Diagnosis not present

## 2023-12-27 DIAGNOSIS — F419 Anxiety disorder, unspecified: Secondary | ICD-10-CM | POA: Diagnosis not present

## 2023-12-27 DIAGNOSIS — J8410 Pulmonary fibrosis, unspecified: Secondary | ICD-10-CM | POA: Diagnosis not present

## 2023-12-27 DIAGNOSIS — K219 Gastro-esophageal reflux disease without esophagitis: Secondary | ICD-10-CM | POA: Diagnosis not present

## 2023-12-27 DIAGNOSIS — Z79899 Other long term (current) drug therapy: Secondary | ICD-10-CM | POA: Diagnosis not present

## 2023-12-28 DIAGNOSIS — C3411 Malignant neoplasm of upper lobe, right bronchus or lung: Secondary | ICD-10-CM | POA: Diagnosis not present

## 2023-12-28 DIAGNOSIS — C799 Secondary malignant neoplasm of unspecified site: Secondary | ICD-10-CM | POA: Diagnosis not present

## 2023-12-28 DIAGNOSIS — R0600 Dyspnea, unspecified: Secondary | ICD-10-CM | POA: Diagnosis not present

## 2023-12-28 DIAGNOSIS — J90 Pleural effusion, not elsewhere classified: Secondary | ICD-10-CM | POA: Diagnosis not present

## 2023-12-28 DIAGNOSIS — I340 Nonrheumatic mitral (valve) insufficiency: Secondary | ICD-10-CM | POA: Diagnosis not present

## 2023-12-29 ENCOUNTER — Telehealth: Payer: Self-pay

## 2023-12-29 DIAGNOSIS — Z9981 Dependence on supplemental oxygen: Secondary | ICD-10-CM | POA: Diagnosis not present

## 2023-12-29 DIAGNOSIS — R911 Solitary pulmonary nodule: Secondary | ICD-10-CM | POA: Diagnosis not present

## 2023-12-29 DIAGNOSIS — Z9989 Dependence on other enabling machines and devices: Secondary | ICD-10-CM | POA: Diagnosis not present

## 2023-12-29 DIAGNOSIS — J441 Chronic obstructive pulmonary disease with (acute) exacerbation: Secondary | ICD-10-CM | POA: Diagnosis present

## 2023-12-29 DIAGNOSIS — Z923 Personal history of irradiation: Secondary | ICD-10-CM | POA: Diagnosis not present

## 2023-12-29 DIAGNOSIS — Z886 Allergy status to analgesic agent status: Secondary | ICD-10-CM | POA: Diagnosis not present

## 2023-12-29 DIAGNOSIS — F329 Major depressive disorder, single episode, unspecified: Secondary | ICD-10-CM | POA: Diagnosis present

## 2023-12-29 DIAGNOSIS — Z7984 Long term (current) use of oral hypoglycemic drugs: Secondary | ICD-10-CM | POA: Diagnosis not present

## 2023-12-29 DIAGNOSIS — Z881 Allergy status to other antibiotic agents status: Secondary | ICD-10-CM | POA: Diagnosis not present

## 2023-12-29 DIAGNOSIS — C3411 Malignant neoplasm of upper lobe, right bronchus or lung: Secondary | ICD-10-CM | POA: Diagnosis not present

## 2023-12-29 DIAGNOSIS — G4733 Obstructive sleep apnea (adult) (pediatric): Secondary | ICD-10-CM | POA: Diagnosis present

## 2023-12-29 DIAGNOSIS — R918 Other nonspecific abnormal finding of lung field: Secondary | ICD-10-CM | POA: Diagnosis present

## 2023-12-29 DIAGNOSIS — I10 Essential (primary) hypertension: Secondary | ICD-10-CM | POA: Diagnosis present

## 2023-12-29 DIAGNOSIS — J9601 Acute respiratory failure with hypoxia: Secondary | ICD-10-CM | POA: Diagnosis present

## 2023-12-29 DIAGNOSIS — E119 Type 2 diabetes mellitus without complications: Secondary | ICD-10-CM | POA: Diagnosis present

## 2023-12-29 DIAGNOSIS — Z853 Personal history of malignant neoplasm of breast: Secondary | ICD-10-CM | POA: Diagnosis not present

## 2023-12-29 DIAGNOSIS — J189 Pneumonia, unspecified organism: Secondary | ICD-10-CM | POA: Diagnosis present

## 2023-12-29 DIAGNOSIS — Z79811 Long term (current) use of aromatase inhibitors: Secondary | ICD-10-CM | POA: Diagnosis not present

## 2023-12-29 DIAGNOSIS — C3490 Malignant neoplasm of unspecified part of unspecified bronchus or lung: Secondary | ICD-10-CM | POA: Diagnosis present

## 2023-12-29 NOTE — Telephone Encounter (Signed)
 Copied from CRM #[id number]. Topic: General - Other >> Dec 28, 2023  1:39 PM Essie A wrote: Reason for CRM: Dr. Wanda Cornish (oncology) called to speak with Dr. Darlean, urgently, regarding patient.  Please return her call asap at [phone].  Thanks.  Informed dr wert of this  yesterday 10/21

## 2023-12-30 ENCOUNTER — Telehealth: Payer: Self-pay | Admitting: Oncology

## 2023-12-30 NOTE — Telephone Encounter (Signed)
 12/30/23 Spoke with patients daughter and scheduled appt.

## 2023-12-31 ENCOUNTER — Telehealth: Payer: Self-pay

## 2023-12-31 ENCOUNTER — Telehealth: Payer: Self-pay | Admitting: Oncology

## 2023-12-31 ENCOUNTER — Other Ambulatory Visit: Payer: Self-pay | Admitting: Hematology and Oncology

## 2023-12-31 DIAGNOSIS — C3411 Malignant neoplasm of upper lobe, right bronchus or lung: Secondary | ICD-10-CM

## 2023-12-31 NOTE — Telephone Encounter (Addendum)
 Pt called & states, I want to leave message for Dr Cornelius. The biopsy results are back. I have small cell lung cancer. Pt is to be discharged from hospital today. I told her Dr Cornelius is out of clinic until Tuesday but I would pass message to her and Kelli,PA.  I also message to [facility].

## 2023-12-31 NOTE — Telephone Encounter (Signed)
 Patient has been scheduled for follow-up visit per 12/31/23 LOS.  Pt aware of scheduled appt details.

## 2023-12-31 NOTE — Discharge Summary (Signed)
 [facility]  Discharge Summary  PCP: MARCELLUS GORMAN BAPTIST, MD Discharge Details   Admit date:         12/29/2023 Discharge date and time:       12/31/2023 Hospital LOS:    3  days  Active Hospital Problems   Diagnosis Date Noted POA  . *Mass of right lung 12/29/2023 Yes  . Acute hypoxic respiratory failure (*) 12/29/2023 Yes  . Community acquired pneumonia, bilateral 12/29/2023 Yes    Resolved Hospital Problems   Diagnosis Date Noted Date Resolved POA  . Asthma with COPD with exacerbation (*) 12/29/2023 12/31/2023 Yes      Current Discharge Medication List     START taking these medications      Details  * benzonatate 100 mg capsule Commonly known as: TESSALON  Take one capsule (100 mg dose) by mouth 3 (three) times a day as needed for Cough for up to 7 days. Quantity: 20 capsule   * benzonatate 100 mg capsule Commonly known as: TESSALON  Take one capsule (100 mg dose) by mouth 3 (three) times a day for 7 days. Quantity: 20 capsule   budesonide -formoterol  80-4.5 mcg/actuation inhaler Commonly known as: SYMBICORT   Inhale two puffs into the lungs every 12 (twelve) hours. Quantity: 10.2 g   cefdinir 300 mg capsule Commonly known as: OMNICEF  Take one capsule (300 mg dose) by mouth 2 (two) times daily for 5 days. Quantity: 10 capsule   guaiFENesin-dextromethorphan 100-10 mg/5mL liquid Commonly known as: ALTARUSSIN,TUSSIN DM  Take 5 mLs by mouth every 6 (six) hours for 10 days. Quantity: 118 mL   ipratropium-albuterol 0.5-2.5 mg/3 mL ML Soln nebulizer solution Commonly known as: DUONEB  Take 3 mLs by nebulization every 4 (four) hours. Quantity: 360 mL   predniSONE 20 mg tablet Commonly known as: DELTASONE Start taking on: January 01, 2024  Take two tablets (40 mg dose) by mouth daily for 7 days. Quantity: 14 tablet      * * This list has 2 medication(s) that are the same as other medications  prescribed for you. Read the directions carefully, and ask your doctor or other care provider to review them with you.          CONTINUE these medications which have NOT CHANGED      Details  albuterol sulfate HFA 108 (90 Base) MCG/ACT inhaler Commonly known as: PROVENTIL,VENTOLIN,PROAIR  Inhale two puffs into the lungs every 4 (four) hours as needed for Wheezing or Shortness of Breath. Quantity: 8 g   alpha-tocopherol 400 Units capsule Commonly known as: VITA-PLUS  Take one capsule (400 Units dose) by mouth daily.   fexofenadine 180 mg tablet Commonly known as: ALLEGRA  Take one tablet (180 mg dose) by mouth daily.   FLUoxetine 20 mg capsule Commonly known as: PROZAC  Take one capsule (20 mg dose) by mouth daily.   fluticasone -salmeterol 250-50 mcg/dose Aepb inhalation powder Commonly known as: ADVAIR DISKUS/WIXELA  Inhale one puff into the lungs every 12 (twelve) hours.   furosemide 40 mg tablet Commonly known as: LASIX  Take one tablet (40 mg dose) by mouth daily.   gabapentin 600 mg tablet Commonly known as: NEURONTIN  Take one tablet (600 mg dose) by mouth 2 (two) times daily.   GARLIC PO  Take 1 capsule by mouth at bedtime.   losartan-hydrochlorothiazide 50-12.5 mg per tablet Commonly known as: HYZAAR  Take one tablet by mouth daily.   metformin 500  mg (OSM) 24 hr tablet Commonly known as: FORTAMET  Take one tablet (500 mg dose) by mouth with supper.   metoprolol  tartrate 25 mg tablet Commonly known as: LOPRESSOR   Take one tablet (25 mg dose) by mouth 2 (two) times daily.   montelukast 10 MG tablet Commonly known as: SINGULAIR  Take one tablet (10 mg dose) by mouth at bedtime.   nitroGLYCERIN  0.4 mg SL tablet Commonly known as: NITROSTAT   Place one tablet (0.4 mg dose) under the tongue every 5 (five) minutes as needed.   OCUVITE-LUTEIN PO  Take 1 capsule by mouth 2 (two) times daily.   ondansetron  4 mg disintegrating tablet Commonly known as:  ZOFRAN -ODT  Take one tablet (4 mg dose) by mouth every 8 (eight) hours as needed for Nausea.   pantoprazole  sodium 40 mg tablet Commonly known as: PROTONIX   Take one tablet (40 mg dose) by mouth daily.   zinc gluconate 50 MG tablet  Take one tablet (50 mg dose) by mouth daily.      * You might also be taking other medications not listed above. If you have questions about any of your other medications, talk to the person who prescribed them or your Primary Care Provider.           Reason for medication changes: Patient was prescribed DuoNeb for COPD treatment at home. She was given 7-day course of steroids. She also received Omnicef for 5 days.  Hospital Course   Indication for Admission/chief complaint:  RR:[id number] for concern for new malignancy    History of Present Illness:  78 y/o F with hx stage 1A right breast cancer 06/2003 (s/p lumpectomy, doxorubicin + cyclophosphamide, and 5 years of adjuvant hormonal therapy with tamoxifen/anastrozole), stage 1A left breast cancer (s/p lumpectomy, adjuvant radiation, and 5 years of adjuvant hormonal therapy with tamoxifen/anastrozole), COPD/asthma overlap, OSA on CPAP, HTN, T2DM, MDD who is transferred from [facility] 12/29/23 for HLOC for evaluation of RUL pleural based mass.   Reports > 1 month history of progressively worsening SOB, DOE, PND, orthopnea, dry cough. Reports that these symptoms are always present but can worsen intermittently. Reports intermittent subjective fever, rhinorrhea, congestion.    Denies nipple inversion, nipple discharge, changes to skin overlying breast, palpable breast mass, breast pain.   No exposure to inhalants or asbestos.      Hospital Course:       Per the H&P above, patient was transferred here because of a concern of a mass noted on imaging.  Patient has had bilateral breast cancer status post treatment and follows up with oncologist.  Prior to being transferred, she was being treated at  her facility where they set her up for home oxygen .  In our facility, in addition to setting her up for CT-guided biopsy, we started treating her for COPD exacerbation.  She received IV ceftriaxone  which she tolerated very well. Patient was seen by IR a day after admission and she underwent the biopsy of her lungs.  She did have some bleeding after that biopsy but it did not last long.  She was not having any more significant hemoptysis a day after the procedure.  She did even better receiving treatment for the COPD. Patient's biopsy report came back on day 3 of her hospitalization.  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  Patient has an oncologist and she was advised to follow-up with the oncologist.  She was comfortable and ready to be discharged after her results were known. On the  day of discharge, patient was seen in her room.  She was not in any distress.  She was examined by me and after my exam, considered stable enough for discharge.  She will follow-up with her PCP in about a week and later with the oncologist at a later date.  Recommendations to physicians/followup needed: Patient will follow-up with her PCP in about a week and with her oncologist at a later date.      Physical Exam: Vitals:   12/31/23 1306  BP:   Pulse: 82  Resp:   Temp:   SpO2: 97%   Constitutional - resting comfortably, no acute distress Eyes - pupils equal round and reactive to light and accomodation Nose - no gross deformity or drainage Mouth - no oral lesions noted, MM moist and pink ** Throat - no swelling or erythema     CV - (+)S1S2, no murmurs, or peripheral edema, no JVD     Resp -decreased air entry bilaterally with no wheezes.  No crackles were heard. GI - (+)BS, soft, non-tender, non-distended MSK- ROM normal   Skin - no rashes or wounds Neuro - alert, aware, oriented to person/place/time   Psych - normal affect and mood      Labs on Discharge:  Recent Labs    Units 12/31/23 0458  12/30/23 0335 12/29/23 0315  WBC thou/mcL 8.0 7.9 10.0  HGB gm/dL 9.1* 9.3* 9.5*  HCT % 71.6* 29.1* 30.1*  PLT thou/mcL 276 257 275   Recent Labs    Units 12/31/23 0458 12/30/23 0335 12/29/23 0315  NA mmol/L 130* 131* 133*  K mmol/L 4.1 4.3 4.2  CL mmol/L 93* 94* 93*  CO2 mmol/L 26 27 27   BUN mg/dL 16 18 22   CREATININE mg/dL 9.28 9.19 9.17  CALCIUM  mg/dL 9.1 9.2 9.2   Recent Labs    Units 12/29/23 0315  BILITOT mg/dL 0.2  AST U/L 28  ALT U/L 26  ALKPHOS U/L 87  ALBUMIN gm/dL 3.5   Recent Labs    Units 12/29/23 0315  HGBA1C % 7.2*   No results for input(s): LABPROT, INR, PTT in the last 168 hours. No results for input(s): CHOL, LDL, HDL, TRIG in the last 168 hours. No results for input(s): TROPONIN, CK in the last 168 hours.  Invalid input(s): CK-MB  Diagnostics:   XR Chest Post Procedure  Final Result  IMPRESSION:  No pneumothorax.      Electronically Signed by: Ozell JONETTA Cordial, MD on 12/29/2023 4:47 PM    XR Chest Post Procedure  Final Result  IMPRESSION: No pneumothorax status post right upper lobe biopsy.    Electronically Signed by: Grayce Linear, MD on 12/29/2023 3:42 PM    CT Guided Biopsy  Final Result  IMPRESSION: Technically successful right lung lesion biopsy. However, only 2 samples were taken due to hemoptysis during the case. Procedure was aborted and patient was placed in the decubitus position and hemoptysis eventually resolved.      Electronically Signed by: Alicia Davidson, MD on 12/29/2023 3:09 PM     No results found for this or any previous visit.   Post Hospital Care   Discharge Procedure Orders  Follow-up with Primary Care Physician  Standing Status: Future  Referral Priority: Routine Referral Type: Consultation  Referral Reason: Evaluate and Return  Number of Visits Requested: 1 Expiration Date: 06/27/24   Cardiac diet (heart healthy)   Notify physician - Temp  Order Comments: Call MD if  Temperature above 100.4  Degrees F  Notify Physician for trouble breathing or symptoms that are worse   Notify physician - Contact your doctor for excessive bleeding   Notify Physician for increased shortness of breath   Notify Physician for persistent vomiting   Notify Physician for palpitations (Fluttering in your chest)   Notify Physician for dizziness when trying to stand   Activity as tolerated     Potential for Rehab:        Good Code Status:   Full Code Disposition: Home Consults: IR  Followup appointments: No future appointments.   Time spent in discharge process:  total time spent 35 minutes   Electronically signed: Durwood DELENA Skeeter, MD 12/31/2023 / 2:23 PM   *Some images could not be shown.

## 2024-01-03 ENCOUNTER — Telehealth: Payer: Self-pay

## 2024-01-03 NOTE — Progress Notes (Signed)
 Per discharge summary patient is aware of results -- Patient's biopsy report came back on day 3 of her hospitalization.  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  Patient has an oncologist and she was advised to follow-up with the oncologist.

## 2024-01-03 NOTE — Transitions of Care (Post Inpatient/ED Visit) (Signed)
   01/03/2024  Name: Suri Tafolla MRN: [id number] DOB: [birth year]  Today's TOC FU Call Status: Today's TOC FU Call Status:: Unsuccessful Call (1st Attempt) Unsuccessful Call (1st Attempt) Date: 01/03/24  Attempted to reach the patient regarding the most recent Inpatient/ED visit.  Follow Up Plan: Additional outreach attempts will be made to reach the patient to complete the Transitions of Care (Post Inpatient/ED visit) call.   Shona Prow RN, CCM [place]  VBCI-Population Health RN Care Manager [phone]

## 2024-01-04 ENCOUNTER — Telehealth: Payer: Self-pay

## 2024-01-04 DIAGNOSIS — C3490 Malignant neoplasm of unspecified part of unspecified bronchus or lung: Secondary | ICD-10-CM | POA: Diagnosis not present

## 2024-01-04 DIAGNOSIS — Z0000 Encounter for general adult medical examination without abnormal findings: Secondary | ICD-10-CM | POA: Diagnosis not present

## 2024-01-04 DIAGNOSIS — Z1339 Encounter for screening examination for other mental health and behavioral disorders: Secondary | ICD-10-CM | POA: Diagnosis not present

## 2024-01-04 DIAGNOSIS — Z6833 Body mass index (BMI) 33.0-33.9, adult: Secondary | ICD-10-CM | POA: Diagnosis not present

## 2024-01-04 NOTE — Patient Instructions (Signed)
 Visit Information  Thank you for taking time to visit with me today. Please don't hesitate to contact me if I can be of assistance to you before our next scheduled telephone appointment.  Our next appointment is by telephone on 01/07/24 in the afternoon   Following is a copy of your care plan:   Goals Addressed             This Visit's Progress    VBCI Transitions of Care (TOC) Care Plan       Problems:  Recent Hospitalization for treatment of Mass of right lung   Patient reports feeling concern that cancer has spread and is awaiting PET scan, MRI, and appointment with Dr Wanda Cornish 01/05/24 for answers  Goal:  Over the next 30 days, the patient will not experience hospital readmission  Interventions:  Transitions of Care: Doctor Visits  - discussed the importance of doctor visits - Patient is scheduled to see PCP, Dr Ina today - plans to discuss the fluid in her arms  Discussed diabetes history and patient states she doesn't check her sugar - not a priority at this time - A1C 7.2 12/29/23 During medication review-TOC RN educated on importance of discussing nitroglycerin  with provider as she feels hers has expired Discussed new diagnosis with patient who states she is nervous about her appt with oncology tomorrow but is wanting answers - states she feels the cancer has likely already spread elsewhere and states she has had breast cancer x 2   COPD Interventions: Advised patient to track and manage COPD triggers Assessed social determinant of health barriers Discussed the importance of adequate rest and management of fatigue with COPD Provided instruction about proper use of medications used for management of COPD including inhalers Use of home oxygen  new on 3 liters - Education provided on oxygen  safety including no smoking in the home - oxygen  in use sign on door - and notifying power company - patient asking about POC and was directed to discuss with provider as this  requires an order    Oncology: Assessment of understanding of oncology diagnosis:  Reviewed upcoming provider appointments and treatment appointments Assessed available transportation to appointments and treatments. Has consistent/reliable transportation: Yes Assessed support system. Has consistent/reliable family or other support: Yes  Patient Self Care Activities:  Attend all scheduled provider appointments Call pharmacy for medication refills 3-7 days in advance of running out of medications Call provider office for new concerns or questions  Notify RN Care Manager of TOC call rescheduling needs Participate in Transition of Care Program/Attend TOC scheduled calls Take medications as prescribed   identify and remove indoor air pollutants limit outdoor activity during cold weather keep follow-up appointments: Pulmonary appointment scheduled with Dr Darlean and PFT 02/28/2024  Plan:  Telephone follow up appointment with care management team member scheduled for:  10/31/25pm The patient has been provided with contact information for the care management team and has been advised to call with any health related questions or concerns.         Patient verbalizes understanding of instructions and care plan provided today and agrees to view in MyChart. Active MyChart status and patient understanding of how to access instructions and care plan via MyChart confirmed with patient.     Telephone follow up appointment with care management team member scheduled for:01/07/2024 The patient has been provided with contact information for the care management team and has been advised to call with any health related questions or concerns.  Please call the care guide team at [phone] if you need to cancel or reschedule your appointment.   Please call the Suicide and Crisis Lifeline: 988 call 1-800-273-TALK (toll free, 24 hour hotline) call 911 if you are experiencing a Mental Health or Behavioral  Health Crisis or need someone to talk to.  Shona Prow RN, CCM [place]  VBCI-Population Health RN Care Manager [phone]

## 2024-01-04 NOTE — Transitions of Care (Post Inpatient/ED Visit) (Signed)
 01/04/2024  Name: Heather Chavez MRN: [id number] DOB: Jun 20, 1945  Today's TOC FU Call Status: Today's TOC FU Call Status:: Unsuccessful Call (2nd Attempt) Unsuccessful Call (2nd Attempt) Date: 01/04/24 Patient's Name and Date of Birth confirmed.  Transition Care Management Follow-up Telephone Call How have you been since you were released from the hospital?: Better Any questions or concerns?: No  Items Reviewed: Did you receive and understand the discharge instructions provided?: Yes Medications obtained,verified, and reconciled?: Yes (Medications Reviewed) Any new allergies since your discharge?: No Dietary orders reviewed?: NA Do you have support at home?: Yes People in Home [RPT]: child(ren), adult Name of Support/Comfort Primary Source: Patient has 5 children and has grandchildren - daughter is living downstairs and a granddaughter is living in home as well.  Medications Reviewed Today: Medications Reviewed Today     Reviewed by Lauro Shona LABOR, RN (Registered Nurse) on 01/04/24 at 1358  Med List Status: <None>   Medication Order Taking? Sig Documenting Provider Last Dose Status Informant  albuterol (VENTOLIN HFA) 108 (90 Base) MCG/ACT inhaler [id number] Yes Inhale 2 puffs into the lungs every 4 (four) hours as needed for wheezing. [provider]  Active   baclofen (LIORESAL) 10 MG tablet [id number]  Take 10 mg by mouth 3 (three) times daily.  Patient not taking: Reported on 01/04/2024   [provider]  Active   Biotin 89999 MCG TABS [id number] Yes Take 10,000 mcg by mouth daily. [provider]  Active   Boswellia-Glucosamine-Vit D (OSTEO BI-FLEX ONE PER DAY) TABS [id number] Yes Take by mouth. [provider]  Active   budesonide -formoterol  (SYMBICORT ) 80-4.5 MCG/ACT inhaler [id number] Yes Inhale 2 puffs into the lungs 2 (two) times daily. [provider]  Active   cefdinir (OMNICEF) 300 MG capsule [id number] Yes Take 300 mg  by mouth 2 (two) times daily. [provider]  Active   dextromethorphan-guaiFENesin (ROBITUSSIN-DM) 10-100 MG/5ML liquid [id number] Yes Take 5 mLs by mouth every 6 (six) hours as needed for cough. [provider]  Active   fexofenadine (ALLEGRA) 180 MG tablet [id number] Yes Take 180 mg by mouth. [provider]  Active   FLUoxetine (PROZAC) 20 MG capsule [id number] Yes Take 20 mg by mouth daily. [provider]  Active   fluticasone  (FLONASE) 50 MCG/ACT nasal spray [id number]  Place 1 spray into both nostrils daily.  Patient not taking: Reported on 01/04/2024   [provider]  Active   fluticasone -salmeterol [facility]) 250-50 MCG/ACT AEPB [id number]  Take1 puff  first thing in am and then another puff  about 12 hours later.  Patient not taking: Reported on 01/04/2024   Darlean Ozell NOVAK, MD  Consider Medication Status and Discontinue (Discontinued by provider)   furosemide (LASIX) 20 MG tablet [id number] Yes Take 20 mg by mouth as needed for fluid or edema.  Patient taking differently: Take 40 mg by mouth as needed for fluid or edema. Per hospital discharge 40mg  daily   [provider]  Active   gabapentin (NEURONTIN) 400 MG capsule [id number] Yes Take 400 mg by mouth 2 (two) times daily.  Patient taking differently: Take 400 mg by mouth 2 (two) times daily. Per hospital discharge - 600mg  BID   [provider]  Active   ipratropium-albuterol (DUONEB) 0.5-2.5 (3) MG/3ML SOLN [id number] Yes Take 3 mLs by nebulization every 4 (four) hours as needed (breathing treatment). [provider]  Active   LEXAPRO 10 MG tablet [id number]    Patient not taking: Reported  on 01/04/2024   [provider]  Consider Medication Status and Discontinue (Change in therapy)   losartan-hydrochlorothiazide (HYZAAR) 50-12.5 MG tablet [id number] Yes Take 1 tablet by mouth daily. [provider]  Active   metFORMIN (GLUCOPHAGE) 500 MG tablet  [id number] Yes Take 500 mg by mouth daily. [provider]  Active   metoprolol  tartrate (LOPRESSOR ) 25 MG tablet [id number] Yes Take 25 mg by mouth 2 (two) times daily. [provider]  Active   montelukast (SINGULAIR) 10 MG tablet [id number] Yes Take 10 mg by mouth at bedtime. [provider]  Active   Multiple Vitamins-Minerals (PRESERVISION AREDS 2 PO) [id number] Yes Take by mouth. [provider]  Active   nitroGLYCERIN  (NITROSTAT ) 0.4 MG SL tablet [id number] Yes Place 0.4 mg under the tongue every 5 (five) minutes as needed for chest pain. [provider]  Active   pantoprazole  (PROTONIX ) 40 MG tablet [id number] Yes Take 1 tablet (40 mg total) by mouth daily. Call [phone] to schedule an office visit for more refills Charlanne Groom, MD  Active   predniSONE (DELTASONE) 20 MG tablet [id number] Yes Take 40 mg by mouth daily with breakfast. [provider]  Active   vitamin E 180 MG (400 UNITS) capsule [id number] Yes Take 400 Units by mouth daily. [provider]  Active   zinc gluconate 50 MG tablet [id number] Yes Take 50 mg by mouth daily. [provider]  Active             Home Care and Equipment/Supplies: Were Home Health Services Ordered?: NA Any new equipment or medical supplies ordered?: No  Functional Questionnaire: Do you need assistance with bathing/showering or dressing?: No Do you need assistance with meal preparation?: Yes (granddaughter prepares all the meals) Do you need assistance with eating?: No Do you have difficulty maintaining continence: Yes (Patient states she has been having some bowel and bladder incontinence - not focusing on it at this time with lung mass) Do you need assistance with getting out of bed/getting out of a chair/moving?: No Do you have difficulty managing or taking your medications?: No  Follow up appointments reviewed: PCP Follow-up appointment confirmed?: Yes Date of PCP  follow-up appointment?: 01/04/24 Follow-up Provider: [facility] Follow-up appointment confirmed?: Yes Date of Specialist follow-up appointment?: 01/05/24 Follow-Up Specialty Provider:: Dr Cornelius Do you need transportation to your follow-up appointment?: No Do you understand care options if your condition(s) worsen?: Yes-patient verbalized understanding  SDOH Interventions Today    Flowsheet Row Most Recent Value  SDOH Interventions   Food Insecurity Interventions Intervention Not Indicated  Housing Interventions Intervention Not Indicated  Transportation Interventions Intervention Not Indicated  Utilities Interventions Intervention Not Indicated    Goals Addressed             This Visit's Progress    [facility] Transitions of Care (TOC) Care Plan       Problems:  Recent Hospitalization for treatment of Mass of right lung   Patient reports feeling concern that cancer has spread and is awaiting PET scan, MRI, and appointment with Dr Wanda Cornelius 01/05/24 for answers  Goal:  Over the next 30 days, the patient will not experience hospital readmission  Interventions:  Transitions of Care: Doctor Visits  - discussed the importance of doctor visits - Patient is scheduled to see PCP, Dr Ina today - plans to discuss the fluid in her arms  Discussed diabetes history and patient states she doesn't check her sugar - not a priority  at this time - A1C 7.2 12/29/23 During medication review-TOC RN educated on importance of discussing nitroglycerin  with provider as she feels hers has expired Discussed new diagnosis with patient who states she is nervous about her appt with oncology tomorrow but is wanting answers - states she feels the cancer has likely already spread elsewhere and states she has had breast cancer x 2   COPD Interventions: Advised patient to track and manage COPD triggers Assessed social determinant of health barriers Discussed the importance of adequate  rest and management of fatigue with COPD Provided instruction about proper use of medications used for management of COPD including inhalers Use of home oxygen  new on 3 liters - Education provided on oxygen  safety including no smoking in the home - oxygen  in use sign on door - and notifying power company - patient asking about POC and was directed to discuss with provider as this requires an order    Oncology: Assessment of understanding of oncology diagnosis:  Reviewed upcoming provider appointments and treatment appointments Assessed available transportation to appointments and treatments. Has consistent/reliable transportation: Yes Assessed support system. Has consistent/reliable family or other support: Yes  Patient Self Care Activities:  Attend all scheduled provider appointments Call pharmacy for medication refills 3-7 days in advance of running out of medications Call provider office for new concerns or questions  Notify RN Care Manager of TOC call rescheduling needs Participate in Transition of Care Program/Attend TOC scheduled calls Take medications as prescribed   identify and remove indoor air pollutants limit outdoor activity during cold weather keep follow-up appointments: Pulmonary appointment scheduled with Dr Darlean and PFT 02/28/2024  Plan:  Telephone follow up appointment with care management team member scheduled for:  10/31/25pm The patient has been provided with contact information for the care management team and has been advised to call with any health related questions or concerns.          Shona Prow RN, CCM [place]  [facility]-Population Health RN Care Manager [phone]

## 2024-01-05 ENCOUNTER — Other Ambulatory Visit: Payer: Self-pay | Admitting: Oncology

## 2024-01-05 ENCOUNTER — Encounter: Payer: Self-pay | Admitting: Oncology

## 2024-01-05 ENCOUNTER — Inpatient Hospital Stay: Attending: Oncology | Admitting: Oncology

## 2024-01-05 ENCOUNTER — Other Ambulatory Visit (HOSPITAL_BASED_OUTPATIENT_CLINIC_OR_DEPARTMENT_OTHER): Payer: Self-pay

## 2024-01-05 VITALS — BP 129/63 | HR 90 | Temp 98.1°F | Resp 18 | Ht 65.0 in | Wt 212.6 lb

## 2024-01-05 DIAGNOSIS — D649 Anemia, unspecified: Secondary | ICD-10-CM | POA: Insufficient documentation

## 2024-01-05 DIAGNOSIS — Z853 Personal history of malignant neoplasm of breast: Secondary | ICD-10-CM | POA: Insufficient documentation

## 2024-01-05 DIAGNOSIS — C787 Secondary malignant neoplasm of liver and intrahepatic bile duct: Secondary | ICD-10-CM | POA: Diagnosis present

## 2024-01-05 DIAGNOSIS — Z9221 Personal history of antineoplastic chemotherapy: Secondary | ICD-10-CM | POA: Insufficient documentation

## 2024-01-05 DIAGNOSIS — Z923 Personal history of irradiation: Secondary | ICD-10-CM | POA: Diagnosis not present

## 2024-01-05 DIAGNOSIS — Z23 Encounter for immunization: Secondary | ICD-10-CM | POA: Diagnosis not present

## 2024-01-05 DIAGNOSIS — C3431 Malignant neoplasm of lower lobe, right bronchus or lung: Secondary | ICD-10-CM

## 2024-01-05 DIAGNOSIS — E871 Hypo-osmolality and hyponatremia: Secondary | ICD-10-CM | POA: Diagnosis not present

## 2024-01-05 DIAGNOSIS — C3411 Malignant neoplasm of upper lobe, right bronchus or lung: Secondary | ICD-10-CM | POA: Insufficient documentation

## 2024-01-05 DIAGNOSIS — Z87891 Personal history of nicotine dependence: Secondary | ICD-10-CM | POA: Insufficient documentation

## 2024-01-05 DIAGNOSIS — C3430 Malignant neoplasm of lower lobe, unspecified bronchus or lung: Secondary | ICD-10-CM | POA: Insufficient documentation

## 2024-01-05 MED ORDER — FLUZONE HIGH-DOSE 0.5 ML IM SUSY
0.5000 mL | PREFILLED_SYRINGE | Freq: Once | INTRAMUSCULAR | 0 refills | Status: AC
  Filled 2024-01-05: qty 0.5, 1d supply, fill #0

## 2024-01-05 NOTE — Progress Notes (Addendum)
 [facility]  [address] [place],  [place]  [address] [phone]  Clinic Day:  01/05/24  Referring physician: [name], MD   ASSESSMENT & PLAN:  Assessment:  Small Cell Lung Cancer This is newly diagnosed but already at least a stage IIIB based on her significant mediastinal adenopathy, pleural based mass, and small pleural effusion. Hopefully she will not have metastatic disease but we are waiting on the staging scans. Either way she will need chemotherapy and immunotherapy, either concurrent or sequentially, so we will get a port placed as soon as possible and start her treatment next week. I will let her know the results of her PET scan and brain MRI when available.  She will meet with [facility] for chemotherapy education later this week.  Superior Vena Cava Syndrome She has edema of her upper extremities, neck, and face. We therefore have an urgency to get started as soon as possible.  If she does not have a prompt response, she may need palliative radiation.  Hyponatremia This is likely caused by her small cell lung cancer and will be monitored closely. Hopefully it will respond to treatment.   Pneumonia This is multifocal and at least partially related to post obstruction of the right lower main bronchus.   Anemia This is likely related to her new small cell lung cancer and bilateral pneumonia but I will check her vitamin levels.  History of Stage IA (T1b N0 M0) hormone receptor positive right breast cancer This was diagnosed in April, 2005. She was treated with a lumpectomy and pathology revealed a 6mm, grade 3, invasive ductal carcinoma with sentinel node positive with a 3mm micrometastasis. Estrogen and progesterone receptors were positive and her 2 neu negative. She was treated with 4 cycles of doxorubicin and cyclophosphamide. She was then placed on Anastrozole in November, 2005 and took that for 3.69yrs, but was switched to Tamoxifen in April, 2009 because  of her arthralgias related to Anastrozole. She completed 5 years of adjuvant hormonal therapy.    History of Stage IA (T1b N0 M0) hormone receptor positive left breast cancer This was diagnosed in July, 2015. This was treated with lumpectomy and pathology revealed a 6mm, grade 1, ductal carcinoma with negative sentinel node. Estrogen and progesterone receptors were positive and her 2 neu negative and Ki 67 was 18%. She received adjuvant radiation to the left breast and was then placed on Anastrozole again in August, 2015. This was stopped in October, 2016 due to severe hot flashes, anxiety, and depression, mild cognitive difficulty, trouble finding words, and remembering things as well as becoming easily overwhelmed. She did not tolerate venlafaxine and was placed on paroxetine 20mg  daily in September, 2015 and this improved her symptoms. She was later changed to Lexapro. She was placed on Tamoxifen 20 mg daily for adjuvant hormonal therapy for 5 years.   Plan: Tenicia is seen in the clinic for follow up of her newly diagnosed small cell carcinoma. I explained her diagnosis to her, her daughter, and granddaughter in detail. I informed her that this is a fast growing malignancy but has good response to chemotherapy. However, we are waiting for a final staging based on the PET and brain MRI. She had her PET scan and MRI of the brain this morning and I will notify her when I get the results. I recommend systemic chemotherapy with Carboplatin and Etoposide. She will also have immunotherapy, either concurrent or sequential depending on her staging. She will probably not be  a candidate for radiation due to prior breast radiation bilaterally as well as the fact that she has an pleural based mass and small pleural effusion.  However it is possible she may need palliative radiation at some point.  She presents after her bronchoscopy and is now on home oxygen  3L per nasal canula. We will plan on 4 cycles of  chemotherapy and then immunotherapy after that, indefinitely if she has metastatic disease. I informed her of possible side effects from chemotherapy and immunotherapy such as fatigue, nausea, hair loss, low blood counts, skin rashes, colitis, and thyroid  dysfunction. I advised her to avoid crowded areas and if she develops an illness, fever or infection to contact my office. I mentioned that she will need a port put in place for treatment, she previously had a port before. She will see Dr. Bert tomorrow for consultation and port placement will be scheduled Friday. She will also be scheduled for chemoeducation tomorrow afternoon with NP Melissa. Bruna was started on a course of Prednisone 40 mg daily on 10/25 for 7 days since in the hospital. I explained that we will need to taper her down to Prednisone 20 mg daily for the next week to allow a slower taper, and we will stop it once she starts immunotherapy. I did recommend she get her vaccines for flu, COVID, and RSV. I will place her on allopurinol 300 mg daily in view of her bulky disease. We will also stop her Losartan/hydrochlorothiazide.  Patient states that she feels ok but complains of diarrhea, head pressure, vision changes, congestion, and a tolerable cough with some phlegm. She had labs done on 12/31/2023 and had a WBC of 8.0, low hemoglobin of 9.1, and platelet count of 276,000, she also had a low sodium of 130 at the time. We will plan to start her chemotherapy next week and I will see her back 10 days after her first dose with CBC and CMP. I discussed the assessment and treatment plan with the patient.  The patient was provided an opportunity to ask questions and all were answered.  The patient agreed with the plan and demonstrated an understanding of the instructions.  The patient was advised to call back if the symptoms worsen or if the condition fails to improve as anticipated.  Thank you for the opportunity to care for your patients.   I  provided 53 minutes of face-to-face time during this this encounter and > 50% was spent counseling as documented under my assessment and plan.   [name], MD [facility] - A DEPT OF [facility] [address] [place] [address] Dept: [phone] Dept Fax: [phone]   No orders of the defined types were placed in this encounter.   I, Jasmine Lassiter, am acting as scribe for Wanda HILARIO Cornish, MD  I have reviewed this report as typed by the medical scribe, and it is complete and accurate.  [name], MD   11/2/20254:07 PM  CHIEF COMPLAINT:  CC: Small Cell Lung Cancer  Current Treatment:  Chemotherapy and Immunotherapy   HISTORY OF PRESENT ILLNESS:  Devri Kreher is a 78 y.o. female with a history of bilateral breast cancer who is referred in consultation by Dr. [name] for assessment and management of newly diagnosed small cell lung cancer. She has a history of stage 1A right breast cancer 06/2003 (s/p lumpectomy, doxorubicin + cyclophosphamide, and 5 years of adjuvant hormonal therapy with  tamoxifen/anastrozole), stage 1A left breast cancer (s/p lumpectomy, adjuvant radiation, and 5 years of adjuvant hormonal therapy with tamoxifen/anastrozole), COPD/asthma overlap, and tobacco use.  She underwent testing for hereditary breast and ovarian cancer with the [employer] OvaNext 25 gene panel, which did not reveal any clinically significant mutation. She did have a variant of uncertain significance of MSH2. She had a previous hysterectomy and unilateral oophorectomy for benign disease at a fairly young age, so does not require pap smears. Screening colonoscopy in February, 2017 revealed multiple polyps, all of which were benign polypoid colonic mucosa.  Karin was admitted into the [facility] ED on October 20th, 2025 and presented with shortness of breath, fevers, and chills. She received a  chest x-ray for further evaluation which revealed an right upper lobe infiltrate versus mass and chest CT angio revealed emphysema with chronic interstitial changes in both lung fields corresponding with multifocal pneumonitis and a suspicious pleural based mass in the right upper lobe measuring 3.9cm, and multiple suspicious enlarged mediastinal lymph nodes.  She was diagnosed with mass of the right lung, acute hypoxic respiratory failure, and bilateral community acquired pneumonia before being transferred to [facility]. She then had bronchoscopy and biopsy and pathology report was positive for small cell carcinoma with positive tumor cells for TTF-1 and neuroendocrine markers CD56, synaptophysin, and chromogranin. GATA-3 is negative and Pan keratin showed dot-like positive staining. She comes to me for discussion of staging and treatment and had a PET scan and brain MRI today but results are pending. I recommend systemic chemotherapy with Carboplatin and Etopiside. She will also have immunotherapy, either concurrent or sequential depending on her staging. She will have her chemoeducation tomorrow and port placed this Friday.   I have reviewed her chart and materials related to her cancer extensively and collaborated history with the patient. Summary of oncologic history is as follows: Oncology History  Lung cancer, lower lobe (HCC)  01/05/2024 Initial Diagnosis   Lung cancer, lower lobe (HCC)   01/06/2024 Cancer Staging   Staging form: Lung, AJCC V9 - Clinical stage from 01/06/2024: Stage IVB (cT4, cN2b, cM1c1) - Signed by Cornelius Wanda DEL, MD on 01/06/2024 Histopathologic type: Small cell carcinoma, NOS Stage prefix: Initial diagnosis Method of lymph node assessment: Clinical IASLC grade: G3 Histologic grading system: 3 grade system Laterality: Right Tumor size (mm): 56 Sites of metastasis: Liver Lymph-vascular invasion (LVI): Presence of LVI unknown/indeterminate Diagnostic  confirmation: Positive histology Specimen type: Bronchial Biopsy Staged by: Managing physician Standard uptake value (SUV): 12.5 Perineural invasion (PNI): Unknown Pleural/elastic layer invasion: Unknown Stage used in treatment planning: Yes National guidelines used in treatment planning: Yes Type of national guideline used in treatment planning: NCCN   01/10/2024 -  Chemotherapy   Patient is on Treatment Plan : LUNG SCLC Carboplatin + Etoposide + Atezolizumab Induction q21d x 4 cycles / Atezolizumab Maintenance q21d      INTERVAL HISTORY:  Bryannah is seen in the clinic for follow up of her newly diagnosed small cell carcinoma. I explained her diagnosis to her, her daughter, and granddaughter in detail. I informed her that this is a fast growing malignancy but has good response with chemotherapy. However, we are waiting for a final staging based on the PET and brain MRI. She had her PET scan and MRI of the brain this morning. I recommend systemic chemotherapy with Carboplatin and Etopiside. She will also have immunotherapy, either concurrent or sequential depending on her staging. She will probably not be a candidate for radiation  due to prior breast radiation bilaterally as well as the fact that she has an pleural based mass and small pleural effusion. She presents after her bronchoscopy and is now on home oxygen  3L per nasal canula. We will plan on 4 cycles of chemotherapy and then immunotherapy right after. I informed her of possible side effects from chemotherapy and immunotherapy such as fatigue, nausea, hair loss, low blood counts, skin rashes, colitis, and thyroid  dysfunction. I advised her to avoid crowded areas and if she develops an illness or infection to contact my office. I mentioned that she will need a port put in place for treatment, she previously had 1 port in before. She will see Dr. Bert tomorrow for consultation and port placement will be scheduled Friday. She will also be  scheduled for chemoeducation tomorrow afternoon with NP Melissa. Bruna was started on a course of Prednisone 40mg  daily on 10/25 for 7 days since in the hospital. I explained that we will need to taper her down to Prednisone 20mg  daily for the next week to allow a slower taper, and we will stop it once she starts immunotherapy. I will place her on allopurinol 300 mg daily in view of her bulky disease. . I did recommend she get her vaccines for flu, COVID, and RSV. We will also stop her Losartan/hydrochlorothiazide.  Patient states that she feels ok but complains of diarrhea, head pressure, vision changes, congestion, and a tolerable cough with some phlegm. She had labs done on 12/31/2023 and had a WBC of 8.0, low hemoglobin of 9.1, and platelet count of 276,000, she also had a low sodium of 130 at the time. We will plan to start her chemotherapy next week and I will see her back 10 days after her first dose with CBC and CMP.   She denies fever, chills, night sweats, or other signs of infection. She denies cardiorespiratory issues. She  denies pain. Her appetite is good. Her weight is 212lbs. This patient is accompanied in the office by her daughter and granddaughter.   HISTORY:   Past Medical History:  Diagnosis Date   Advanced nonexudative age-related macular degeneration of right eye with subfoveal involvement 09/21/2019   Advanced nonexudative age-related macular degeneration of right eye without subfoveal involvement 09/21/2019   Allergic rhinitis    Angina pectoris 05/17/2020   Bilateral breast cancer (HCC) 11/23/2013   BMI 34.0-34.9,adult 09/25/2019   Breast cancer (HCC)    Bronchitis, chronic (HCC)    CAD (coronary artery disease)    Cancer (HCC)    Cardiac murmur 05/17/2020   Coronary artery calcification 05/17/2020   Degenerative retinal drusen of right eye 09/21/2019   Diabetes mellitus due to underlying condition with unspecified complications (HCC) 05/17/2020   Diverticulosis    Dry  eyes 06/25/2016   Dyspnea    Emphysema/COPD    Essential hypertension 05/17/2020   Estrogen receptor positive neoplasm 09/24/2015   Family history of malignant neoplasm of breast 11/23/2013   Family history of peritoneal cancer 11/23/2013   Fuchs' corneal dystrophy 11/03/2016   GERD (gastroesophageal reflux disease)    History of colon polyps    History of right breast cancer 09/19/2018   Hypertension    IBS (irritable bowel syndrome)    with D   Intermediate stage nonexudative age-related macular degeneration of left eye 09/21/2019   Iritis 11/03/2016   Macular degeneration    Major depressive disorder    Malignant neoplasm of upper-inner quadrant of left breast in female, estrogen receptor positive (  HCC) 09/01/2016   Mixed dyslipidemia 05/17/2020   Obesity    Obesity (BMI 35.0-39.9 without comorbidity) 05/17/2020   OSA on CPAP 10/29/2020   Diagnosed some 3 months prior after pneumonia episode, Dr. Shellia pulmonology   Peripheral vascular disease    Personal history of chemotherapy    Personal history of radiation therapy    Pseudophakia 09/21/2019   Right-sided chest wall pain    SVT (supraventricular tachycardia) 05/23/2020   Varicose veins of left lower extremity with complications 08/11/2016   Past Surgical History:  Procedure Laterality Date   BREAST BIOPSY     BREAST LUMPECTOMY     right brwast 2005/ left 2015   CATARACT EXTRACTION     CHOLECYSTECTOMY     COLONOSCOPY  04/17/2015   Colonic polyps status post polypectomy. Moderate to severe sigmoid diverticulosis   ENDOVENOUS ABLATION SAPHENOUS VEIN W/ LASER Right 02/19/2023   endovenous laser ablation right greater saphenous vein and stab phlebectomy 10-20 incisions right leg by Gaile New MD   TUBAL LIGATION     VAGINAL HYSTERECTOMY     VEIN LIGATION AND STRIPPING     Family History  Problem Relation Age of Onset   Cancer Mother 27       bilateral breast cancer ages 50 then 65. Peritoneal cancer at age 87.    Other Mother        Ms. Knouff's mother was adopted so have no information regarding maternal family  history other than her mother   Breast cancer Mother    Cancer Father 17       renal   Cancer Sister 60       breast   Breast cancer Sister    Breast cancer Daughter    Breast cancer Maternal Aunt    Breast cancer Paternal Aunt    Breast cancer Paternal Grandmother    Cancer Paternal Grandmother        bilateral breast cancer at unknown age   Social History:  reports that she quit smoking about 42 years ago. Her smoking use included cigarettes. She started smoking about 57 years ago. She has a 37.5 pack-year smoking history. She has never used smokeless tobacco. She reports current alcohol use. She reports that she does not use drugs.The patient is accompanied by her daughter and granddaughter today.  Allergies:  Allergies  Allergen Reactions   Codeine Nausea And Vomiting and Other (See Comments)    Other reaction(s): Other (see comments), Vomiting   Levofloxacin Other (See Comments)    Leg cramps   Nexlizet  [Bempedoic Acid-Ezetimibe ] Other (See Comments)   Pitavastatin  Other (See Comments)    Leg cramps  Other Reaction(s): Other (See Comments)  Leg cramps    Leg cramps   Rosuvastatin  Other (See Comments)    Muscle pain    Current Medications: Current Outpatient Medications  Medication Sig Dispense Refill   gabapentin (NEURONTIN) 600 MG tablet Take 600 mg by mouth 2 (two) times daily.     GARLIC PO Take 1 tablet by mouth daily.     LORazepam  (ATIVAN ) 0.5 MG tablet Take by mouth.     albuterol (VENTOLIN HFA) 108 (90 Base) MCG/ACT inhaler Inhale 2 puffs into the lungs every 4 (four) hours as needed for wheezing.     allopurinol (ZYLOPRIM) 300 MG tablet Take 1 tablet (300 mg total) by mouth daily. 30 tablet 0   Biotin 89999 MCG TABS Take 10,000 mcg by mouth daily.     Boswellia-Glucosamine-Vit D (OSTEO BI-FLEX ONE  PER DAY) TABS Take by mouth.     budesonide -formoterol   (SYMBICORT ) 80-4.5 MCG/ACT inhaler Inhale 2 puffs into the lungs 2 (two) times daily.     dextromethorphan-guaiFENesin (ROBITUSSIN-DM) 10-100 MG/5ML liquid Take 5 mLs by mouth every 6 (six) hours as needed for cough.     fexofenadine (ALLEGRA) 180 MG tablet Take 180 mg by mouth.     fluconazole (DIFLUCAN) 150 MG tablet Take 1 tablet (150 mg total) by mouth daily. 3 tablet 2   FLUoxetine (PROZAC) 20 MG capsule Take 20 mg by mouth daily.     furosemide (LASIX) 40 MG tablet Take 40 mg by mouth.     ipratropium-albuterol (DUONEB) 0.5-2.5 (3) MG/3ML SOLN Take 3 mLs by nebulization every 4 (four) hours as needed (breathing treatment).     losartan-hydrochlorothiazide (HYZAAR) 50-12.5 MG tablet Take 1 tablet by mouth daily.     metFORMIN (GLUCOPHAGE) 500 MG tablet Take 500 mg by mouth daily.     metoprolol  tartrate (LOPRESSOR ) 25 MG tablet Take 25 mg by mouth 2 (two) times daily.     montelukast (SINGULAIR) 10 MG tablet Take 10 mg by mouth at bedtime.     Multiple Vitamins-Minerals (PRESERVISION AREDS 2 PO) Take by mouth.     nitroGLYCERIN  (NITROSTAT ) 0.4 MG SL tablet Place 0.4 mg under the tongue every 5 (five) minutes as needed for chest pain.     ondansetron  (ZOFRAN ) 4 MG tablet Take 1 tablet (4 mg total) by mouth every 4 (four) hours as needed for nausea. 90 tablet 3   ondansetron  (ZOFRAN -ODT) 4 MG disintegrating tablet Take 4 mg by mouth.     pantoprazole  (PROTONIX ) 40 MG tablet Take 1 tablet (40 mg total) by mouth daily. Call [phone] to schedule an office visit for more refills 90 tablet 0   predniSONE (DELTASONE) 20 MG tablet Take 40 mg by mouth daily with breakfast.     prochlorperazine (COMPAZINE) 10 MG tablet Take 1 tablet (10 mg total) by mouth every 6 (six) hours as needed for nausea or vomiting. 90 tablet 3   vitamin E 180 MG (400 UNITS) capsule Take 400 Units by mouth daily.     zinc gluconate 50 MG tablet Take 50 mg by mouth daily.     No current facility-administered medications  for this visit.    REVIEW OF SYSTEMS:  Review of Systems  Constitutional: Negative.  Negative for appetite change, chills, diaphoresis, fatigue, fever and unexpected weight change.  HENT:  Negative.  Negative for hearing loss, lump/mass, mouth sores, nosebleeds, sore throat, tinnitus, trouble swallowing and voice change.   Eyes:  Positive for eye problems (vision changes).  Respiratory:  Positive for cough. Negative for chest tightness, hemoptysis, shortness of breath and wheezing.   Cardiovascular: Negative.  Negative for chest pain, leg swelling and palpitations.  Gastrointestinal:  Positive for diarrhea. Negative for abdominal distention, abdominal pain, blood in stool, constipation, nausea, rectal pain and vomiting.       Bowel incontinence upon straining  Endocrine: Negative.   Genitourinary:  Positive for bladder incontinence (upon straining). Negative for difficulty urinating, dyspareunia, dysuria, frequency, hematuria, menstrual problem, nocturia, pelvic pain, vaginal bleeding and vaginal discharge.   Musculoskeletal: Negative.  Negative for arthralgias, back pain, flank pain, gait problem, myalgias, neck pain and neck stiffness.  Skin: Negative.  Negative for itching, rash and wound.  Neurological:  Positive for headaches and numbness (neuropathy of bilateral feet). Negative for dizziness, extremity weakness, gait problem, light-headedness, seizures and speech difficulty.  Hematological: Negative.  Negative for adenopathy. Does not bruise/bleed easily.  Psychiatric/Behavioral: Negative.  Negative for depression and sleep disturbance. The patient is not nervous/anxious.    VITALS:  Blood pressure 129/63, pulse 90, temperature 98.1 F (36.7 C), temperature source Oral, resp. rate 18, height 5' 5 (1.651 m), weight 212 lb 9.6 oz (96.4 kg), SpO2 98%.  Wt Readings from Last 3 Encounters:  01/06/24 212 lb (96.2 kg)  01/05/24 212 lb 9.6 oz (96.4 kg)  11/12/23 207 lb (93.9 kg)    Body  mass index is 35.38 kg/m.  Performance status (ECOG): 1 - Symptomatic but completely ambulatory  PHYSICAL EXAM:  Physical Exam Vitals and nursing note reviewed. Exam conducted with a chaperone present.  Constitutional:      General: She is not in acute distress.    Appearance: Normal appearance. She is normal weight. She is not ill-appearing, toxic-appearing or diaphoretic.  HENT:     Head: Normocephalic and atraumatic.     Right Ear: Tympanic membrane, ear canal and external ear normal. There is no impacted cerumen.     Left Ear: Tympanic membrane, ear canal and external ear normal. There is no impacted cerumen.     Nose: Nose normal. No congestion or rhinorrhea.     Mouth/Throat:     Mouth: Mucous membranes are moist.     Pharynx: Oropharynx is clear. No oropharyngeal exudate or posterior oropharyngeal erythema.  Eyes:     General: No scleral icterus.       Right eye: No discharge.        Left eye: No discharge.     Extraocular Movements: Extraocular movements intact.     Conjunctiva/sclera: Conjunctivae normal.     Pupils: Pupils are equal, round, and reactive to light.  Cardiovascular:     Rate and Rhythm: Normal rate and regular rhythm.     Pulses: Normal pulses.     Heart sounds: Normal heart sounds. No murmur heard.    No friction rub. No gallop.  Pulmonary:     Effort: Pulmonary effort is normal. No respiratory distress.     Breath sounds: Examination of the right-lower field reveals decreased breath sounds. Decreased breath sounds present.  Abdominal:     General: Bowel sounds are normal. There is no distension.     Palpations: Abdomen is soft. There is no hepatomegaly, splenomegaly or mass.     Tenderness: There is no abdominal tenderness. There is no right CVA tenderness, left CVA tenderness, guarding or rebound.     Hernia: No hernia is present.  Musculoskeletal:        General: Normal range of motion.     Left upper arm: Edema present.     Cervical back: Normal  range of motion and neck supple.     Right lower leg: No edema.     Left lower leg: No edema.     Comments: Left upper extremity with 1-2+ edema Right upper extremity with 1+ edema  Lymphadenopathy:     Cervical: No cervical adenopathy.     Right cervical: No superficial, deep or posterior cervical adenopathy.    Left cervical: No superficial, deep or posterior cervical adenopathy.     Upper Body:     Right upper body: No supraclavicular, axillary or pectoral adenopathy.     Left upper body: No supraclavicular, axillary or pectoral adenopathy.  Skin:    General: Skin is warm and dry.  Neurological:     General: No focal deficit present.  Mental Status: She is alert and oriented to person, place, and time. Mental status is at baseline.  Psychiatric:        Mood and Affect: Mood normal.        Behavior: Behavior normal.        Thought Content: Thought content normal.        Judgment: Judgment normal.    LABS:      Latest Ref Rng & Units 01/06/2024    2:02 PM 08/14/2021    9:31 AM 02/01/2007    8:20 AM  CBC  WBC 4.0 - 10.5 K/uL 11.6  7.3    Hemoglobin 12.0 - 15.0 g/dL 89.6  87.8  86.7   Hematocrit 36.0 - 46.0 % 30.4  35.9  38.0   Platelets 150 - 400 K/uL 287  239        Latest Ref Rng & Units 01/06/2024    2:02 PM 08/14/2021    9:31 AM 02/10/2021   10:15 AM  CMP  Glucose 70 - 99 mg/dL 800  878  874   BUN 8 - 23 mg/dL 24  29  27    Creatinine 0.44 - 1.00 mg/dL 8.81  8.76  8.90   Sodium 135 - 145 mmol/L 124  141  143   Potassium 3.5 - 5.1 mmol/L 3.4  4.7  4.1   Chloride 98 - 111 mmol/L 82  103  102   CO2 22 - 32 mmol/L 31  27  28    Calcium  8.9 - 10.3 mg/dL 9.2  9.9  9.5   Total Protein 6.5 - 8.1 g/dL 6.6  6.9  6.7   Total Bilirubin 0.0 - 1.2 mg/dL 0.3  0.3  0.3   Alkaline Phos 38 - 126 U/L 86  87  76   AST 15 - 41 U/L 39  23  23   ALT 0 - 44 U/L 36  16  21      Lab Results  Component Value Date   CEA 11.87 (H) 01/06/2024   /  CEA ([facility])  Date Value Ref Range  Status  01/06/2024 11.87 (H) 0.00 - 5.00 ng/mL Final    Comment:    (NOTE) This test was performed using Beckman Coulter's paramagnetic chemiluminescent immunoassay. Values obtained from different assay methods cannot be used interchangeably. Please note that up to 8% of patients who smoke may see values 5.1-10.0 ng/ml and 1% of patients who smoke may see CEA levels >10.0 ng/ml. Performed at [facility], [address] [place], [place] [address]    No results found for: PSA1 No results found for: CAN199 No results found for: CAN125  No results found for: TOTALPROTELP, ALBUMINELP, A1GS, A2GS, BETS, BETA2SER, GAMS, MSPIKE, SPEI No results found for: TIBC, FERRITIN, IRONPCTSAT No results found for: LDH  STUDIES:   Exam: 12/29/2023 Pathology Impression: Lung, right upper lobe, needle core biopsy: Small Cell Carcinoma The tumor cells are positive for TTF-1 and neuroendocrine markers CD56, synaptophysin, and chromogranin. GATA-3 is negative, Pan keratin shows dot-like positive staining.     I,Jasmine M Lassiter,acting as a scribe for [name], MD.,have documented all relevant documentation on the behalf of [name], MD,as directed by  [name], MD while in the presence of [name], MD.

## 2024-01-06 ENCOUNTER — Inpatient Hospital Stay

## 2024-01-06 ENCOUNTER — Other Ambulatory Visit: Payer: Self-pay

## 2024-01-06 ENCOUNTER — Other Ambulatory Visit: Payer: Self-pay | Admitting: Oncology

## 2024-01-06 ENCOUNTER — Other Ambulatory Visit (HOSPITAL_BASED_OUTPATIENT_CLINIC_OR_DEPARTMENT_OTHER): Payer: Self-pay

## 2024-01-06 ENCOUNTER — Inpatient Hospital Stay: Admitting: Hematology and Oncology

## 2024-01-06 ENCOUNTER — Telehealth: Payer: Self-pay

## 2024-01-06 VITALS — BP 119/68 | HR 87 | Temp 98.7°F | Resp 16 | Ht 65.0 in | Wt 212.0 lb

## 2024-01-06 DIAGNOSIS — C3431 Malignant neoplasm of lower lobe, right bronchus or lung: Secondary | ICD-10-CM

## 2024-01-06 DIAGNOSIS — Z853 Personal history of malignant neoplasm of breast: Secondary | ICD-10-CM | POA: Diagnosis not present

## 2024-01-06 DIAGNOSIS — Z9221 Personal history of antineoplastic chemotherapy: Secondary | ICD-10-CM | POA: Diagnosis not present

## 2024-01-06 DIAGNOSIS — E871 Hypo-osmolality and hyponatremia: Secondary | ICD-10-CM | POA: Diagnosis not present

## 2024-01-06 DIAGNOSIS — Z923 Personal history of irradiation: Secondary | ICD-10-CM | POA: Diagnosis not present

## 2024-01-06 DIAGNOSIS — C3411 Malignant neoplasm of upper lobe, right bronchus or lung: Secondary | ICD-10-CM

## 2024-01-06 DIAGNOSIS — Z87891 Personal history of nicotine dependence: Secondary | ICD-10-CM | POA: Diagnosis not present

## 2024-01-06 DIAGNOSIS — C787 Secondary malignant neoplasm of liver and intrahepatic bile duct: Secondary | ICD-10-CM | POA: Diagnosis not present

## 2024-01-06 DIAGNOSIS — D649 Anemia, unspecified: Secondary | ICD-10-CM | POA: Diagnosis not present

## 2024-01-06 LAB — CBC WITH DIFFERENTIAL (CANCER CENTER ONLY)
Abs Immature Granulocytes: 0.09 K/uL — ABNORMAL HIGH (ref 0.00–0.07)
Basophils Absolute: 0 K/uL (ref 0.0–0.1)
Basophils Relative: 0 %
Eosinophils Absolute: 0 K/uL (ref 0.0–0.5)
Eosinophils Relative: 0 %
HCT: 30.4 % — ABNORMAL LOW (ref 36.0–46.0)
Hemoglobin: 10.3 g/dL — ABNORMAL LOW (ref 12.0–15.0)
Immature Granulocytes: 1 %
Lymphocytes Relative: 9 %
Lymphs Abs: 1 K/uL (ref 0.7–4.0)
MCH: 30.1 pg (ref 26.0–34.0)
MCHC: 33.9 g/dL (ref 30.0–36.0)
MCV: 88.9 fL (ref 80.0–100.0)
Monocytes Absolute: 0.3 K/uL (ref 0.1–1.0)
Monocytes Relative: 3 %
Neutro Abs: 10.2 K/uL — ABNORMAL HIGH (ref 1.7–7.7)
Neutrophils Relative %: 87 %
Platelet Count: 287 K/uL (ref 150–400)
RBC: 3.42 MIL/uL — ABNORMAL LOW (ref 3.87–5.11)
RDW: 14.4 % (ref 11.5–15.5)
WBC Count: 11.6 K/uL — ABNORMAL HIGH (ref 4.0–10.5)
nRBC: 0 % (ref 0.0–0.2)

## 2024-01-06 LAB — CMP (CANCER CENTER ONLY)
ALT: 36 U/L (ref 0–44)
AST: 39 U/L (ref 15–41)
Albumin: 3.9 g/dL (ref 3.5–5.0)
Alkaline Phosphatase: 86 U/L (ref 38–126)
Anion gap: 11 (ref 5–15)
BUN: 24 mg/dL — ABNORMAL HIGH (ref 8–23)
CO2: 31 mmol/L (ref 22–32)
Calcium: 9.2 mg/dL (ref 8.9–10.3)
Chloride: 82 mmol/L — ABNORMAL LOW (ref 98–111)
Creatinine: 1.18 mg/dL — ABNORMAL HIGH (ref 0.44–1.00)
GFR, Estimated: 47 mL/min — ABNORMAL LOW (ref >60)
Glucose, Bld: 199 mg/dL — ABNORMAL HIGH (ref 70–99)
Potassium: 3.4 mmol/L — ABNORMAL LOW (ref 3.5–5.1)
Sodium: 124 mmol/L — ABNORMAL LOW (ref 135–145)
Total Bilirubin: 0.3 mg/dL (ref 0.0–1.2)
Total Protein: 6.6 g/dL (ref 6.5–8.1)

## 2024-01-06 LAB — CEA (ACCESS): CEA (CHCC): 11.87 ng/mL — ABNORMAL HIGH (ref 0.00–5.00)

## 2024-01-06 MED ORDER — RSVPREF3 VAC RECOMB ADJUVANTED 120 MCG/0.5ML IM SUSR
0.5000 mL | Freq: Once | INTRAMUSCULAR | 0 refills | Status: AC
  Filled 2024-01-06: qty 0.5, 1d supply, fill #0

## 2024-01-06 NOTE — Progress Notes (Signed)
 START ON PATHWAY REGIMEN - Small Cell Lung     A cycle is every 21 days:     Atezolizumab      Carboplatin      Etoposide   **Always confirm dose/schedule in your pharmacy ordering system**  Patient Characteristics: Newly Diagnosed, Preoperative or Nonsurgical Candidate (Clinical Staging), First Line, Extensive Stage Therapeutic Status: Newly Diagnosed, Preoperative or Nonsurgical Candidate (Clinical Staging) AJCC T Category: cT4 AJCC N Category: cN2b AJCC M Category: cM1c1 AJCC 9 Stage Grouping: IVB Check here if patient was staged using an edition other than AJCC Staging 9th Edition: false Stage Classification: Extensive Intent of Therapy: Non-Curative / Palliative Intent, Discussed with Patient

## 2024-01-06 NOTE — Progress Notes (Signed)
 This information is for discussion at Tumor Board and not part of the official treatment plan.

## 2024-01-06 NOTE — Telephone Encounter (Signed)
 [place] [facility] Bo[facility]  Heather Chavez was presented on the [facility] tumor board today. She is a candidate for genetic testing.  Heather Chavez meets National Comprehensive Cancer Network (NCCN) criteria for genetic testing for Hereditary Breast and Ovarian Cancer Syndrome based on her personal history of breast cancer. She   She completed genetic testing in September 2015. She has [employer] OvaNext 24 gene cancer panel. 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 Heather Chavez could have updated and more comprehensive genetic testing.  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 Peritoneal cancer at age 50.   Other Mother        Heather Chavez's mother was adopted so have no information regarding maternal family  history other than her mother   Breast cancer Mother    Cancer Father 31       renal   Cancer Sister 81       breast   Breast cancer Sister    Breast cancer Daughter    Breast cancer Maternal Aunt    Breast cancer Paternal Aunt    Bilateral Breast cancer Paternal Grandmother    Cancer Paternal Grandmother        bilateral breast cancer at unknown age   Heather Fryer, MS, [facility]  Certified Dentist  Email: [email or web address]  Phone: [phone]-

## 2024-01-07 ENCOUNTER — Other Ambulatory Visit: Payer: Self-pay | Admitting: Pharmacist

## 2024-01-07 ENCOUNTER — Other Ambulatory Visit: Payer: Self-pay | Admitting: Hematology and Oncology

## 2024-01-07 ENCOUNTER — Other Ambulatory Visit (HOSPITAL_BASED_OUTPATIENT_CLINIC_OR_DEPARTMENT_OTHER): Payer: Self-pay

## 2024-01-07 ENCOUNTER — Other Ambulatory Visit: Payer: Self-pay | Admitting: Oncology

## 2024-01-07 ENCOUNTER — Other Ambulatory Visit: Payer: Self-pay

## 2024-01-07 ENCOUNTER — Encounter: Payer: Self-pay | Admitting: Oncology

## 2024-01-07 DIAGNOSIS — H3530 Unspecified macular degeneration: Secondary | ICD-10-CM | POA: Diagnosis not present

## 2024-01-07 DIAGNOSIS — E785 Hyperlipidemia, unspecified: Secondary | ICD-10-CM | POA: Diagnosis not present

## 2024-01-07 DIAGNOSIS — N183 Chronic kidney disease, stage 3 unspecified: Secondary | ICD-10-CM | POA: Diagnosis not present

## 2024-01-07 DIAGNOSIS — E669 Obesity, unspecified: Secondary | ICD-10-CM | POA: Diagnosis not present

## 2024-01-07 DIAGNOSIS — R1310 Dysphagia, unspecified: Secondary | ICD-10-CM | POA: Diagnosis not present

## 2024-01-07 DIAGNOSIS — K219 Gastro-esophageal reflux disease without esophagitis: Secondary | ICD-10-CM | POA: Diagnosis not present

## 2024-01-07 DIAGNOSIS — Z452 Encounter for adjustment and management of vascular access device: Secondary | ICD-10-CM | POA: Diagnosis not present

## 2024-01-07 DIAGNOSIS — E1151 Type 2 diabetes mellitus with diabetic peripheral angiopathy without gangrene: Secondary | ICD-10-CM | POA: Diagnosis not present

## 2024-01-07 DIAGNOSIS — C3430 Malignant neoplasm of lower lobe, unspecified bronchus or lung: Secondary | ICD-10-CM

## 2024-01-07 DIAGNOSIS — Z7984 Long term (current) use of oral hypoglycemic drugs: Secondary | ICD-10-CM | POA: Diagnosis not present

## 2024-01-07 DIAGNOSIS — F419 Anxiety disorder, unspecified: Secondary | ICD-10-CM | POA: Diagnosis not present

## 2024-01-07 DIAGNOSIS — J449 Chronic obstructive pulmonary disease, unspecified: Secondary | ICD-10-CM | POA: Diagnosis not present

## 2024-01-07 DIAGNOSIS — R918 Other nonspecific abnormal finding of lung field: Secondary | ICD-10-CM | POA: Diagnosis not present

## 2024-01-07 DIAGNOSIS — Z23 Encounter for immunization: Secondary | ICD-10-CM | POA: Diagnosis not present

## 2024-01-07 DIAGNOSIS — G2581 Restless legs syndrome: Secondary | ICD-10-CM | POA: Diagnosis not present

## 2024-01-07 DIAGNOSIS — F32A Depression, unspecified: Secondary | ICD-10-CM | POA: Diagnosis not present

## 2024-01-07 DIAGNOSIS — J439 Emphysema, unspecified: Secondary | ICD-10-CM | POA: Diagnosis not present

## 2024-01-07 DIAGNOSIS — E1122 Type 2 diabetes mellitus with diabetic chronic kidney disease: Secondary | ICD-10-CM | POA: Diagnosis not present

## 2024-01-07 DIAGNOSIS — R011 Cardiac murmur, unspecified: Secondary | ICD-10-CM | POA: Diagnosis not present

## 2024-01-07 DIAGNOSIS — M1990 Unspecified osteoarthritis, unspecified site: Secondary | ICD-10-CM | POA: Diagnosis not present

## 2024-01-07 DIAGNOSIS — I129 Hypertensive chronic kidney disease with stage 1 through stage 4 chronic kidney disease, or unspecified chronic kidney disease: Secondary | ICD-10-CM | POA: Diagnosis not present

## 2024-01-07 DIAGNOSIS — C3411 Malignant neoplasm of upper lobe, right bronchus or lung: Secondary | ICD-10-CM | POA: Diagnosis not present

## 2024-01-07 DIAGNOSIS — Z853 Personal history of malignant neoplasm of breast: Secondary | ICD-10-CM | POA: Diagnosis not present

## 2024-01-07 DIAGNOSIS — Z79899 Other long term (current) drug therapy: Secondary | ICD-10-CM | POA: Diagnosis not present

## 2024-01-07 DIAGNOSIS — Z87891 Personal history of nicotine dependence: Secondary | ICD-10-CM | POA: Diagnosis not present

## 2024-01-07 DIAGNOSIS — R000 Tachycardia, unspecified: Secondary | ICD-10-CM | POA: Diagnosis not present

## 2024-01-07 DIAGNOSIS — Z6834 Body mass index (BMI) 34.0-34.9, adult: Secondary | ICD-10-CM | POA: Diagnosis not present

## 2024-01-07 LAB — TSH: TSH: 2.79 u[IU]/mL (ref 0.350–4.500)

## 2024-01-07 LAB — URIC ACID: Uric Acid, Serum: 5.9 mg/dL (ref 2.5–7.1)

## 2024-01-07 MED ORDER — PROCHLORPERAZINE MALEATE 10 MG PO TABS: 10.0000 mg | ORAL_TABLET | Freq: Four times a day (QID) | ORAL | 3 refills | Status: AC | PRN

## 2024-01-07 MED ORDER — ONDANSETRON HCL 4 MG PO TABS: 4.0000 mg | ORAL_TABLET | ORAL | 3 refills | Status: AC | PRN

## 2024-01-07 MED ORDER — ALLOPURINOL 300 MG PO TABS: 300.0000 mg | ORAL_TABLET | Freq: Every day | ORAL | 0 refills | Status: AC

## 2024-01-07 MED ORDER — FLUCONAZOLE 150 MG PO TABS: 150.0000 mg | ORAL_TABLET | Freq: Every day | ORAL | 2 refills | Status: DC

## 2024-01-07 MED ORDER — COMIRNATY 30 MCG/0.3ML IM SUSY
0.3000 mL | PREFILLED_SYRINGE | Freq: Once | INTRAMUSCULAR | 0 refills | Status: AC
  Filled 2024-01-07: qty 0.3, 1d supply, fill #0

## 2024-01-08 LAB — T4: T4, Total: 5.6 ug/dL (ref 4.5–12.0)

## 2024-01-09 ENCOUNTER — Other Ambulatory Visit: Payer: Self-pay | Admitting: Oncology

## 2024-01-09 DIAGNOSIS — E876 Hypokalemia: Secondary | ICD-10-CM | POA: Insufficient documentation

## 2024-01-09 DIAGNOSIS — D630 Anemia in neoplastic disease: Secondary | ICD-10-CM | POA: Insufficient documentation

## 2024-01-09 DIAGNOSIS — E871 Hypo-osmolality and hyponatremia: Secondary | ICD-10-CM | POA: Insufficient documentation

## 2024-01-09 MED ORDER — POTASSIUM CHLORIDE CRYS ER 20 MEQ PO TBCR: 20.0000 meq | EXTENDED_RELEASE_TABLET | Freq: Every day | ORAL | 5 refills | Status: AC

## 2024-01-10 ENCOUNTER — Encounter: Payer: Self-pay | Admitting: Oncology

## 2024-01-10 ENCOUNTER — Inpatient Hospital Stay: Attending: Oncology

## 2024-01-10 ENCOUNTER — Other Ambulatory Visit: Payer: Self-pay | Admitting: Pharmacist

## 2024-01-10 ENCOUNTER — Telehealth: Payer: Self-pay

## 2024-01-10 ENCOUNTER — Other Ambulatory Visit: Payer: Self-pay | Admitting: Hematology and Oncology

## 2024-01-10 VITALS — BP 156/69 | HR 99 | Temp 98.0°F | Resp 18

## 2024-01-10 DIAGNOSIS — Z860100 Personal history of colon polyps, unspecified: Secondary | ICD-10-CM | POA: Insufficient documentation

## 2024-01-10 DIAGNOSIS — I871 Compression of vein: Secondary | ICD-10-CM | POA: Diagnosis not present

## 2024-01-10 DIAGNOSIS — C3430 Malignant neoplasm of lower lobe, unspecified bronchus or lung: Secondary | ICD-10-CM

## 2024-01-10 DIAGNOSIS — Z853 Personal history of malignant neoplasm of breast: Secondary | ICD-10-CM | POA: Diagnosis not present

## 2024-01-10 DIAGNOSIS — I2510 Atherosclerotic heart disease of native coronary artery without angina pectoris: Secondary | ICD-10-CM | POA: Insufficient documentation

## 2024-01-10 DIAGNOSIS — Z79899 Other long term (current) drug therapy: Secondary | ICD-10-CM | POA: Diagnosis not present

## 2024-01-10 DIAGNOSIS — E871 Hypo-osmolality and hyponatremia: Secondary | ICD-10-CM | POA: Diagnosis not present

## 2024-01-10 DIAGNOSIS — E782 Mixed hyperlipidemia: Secondary | ICD-10-CM | POA: Insufficient documentation

## 2024-01-10 DIAGNOSIS — Z803 Family history of malignant neoplasm of breast: Secondary | ICD-10-CM | POA: Insufficient documentation

## 2024-01-10 DIAGNOSIS — C787 Secondary malignant neoplasm of liver and intrahepatic bile duct: Secondary | ICD-10-CM | POA: Diagnosis present

## 2024-01-10 DIAGNOSIS — Z87891 Personal history of nicotine dependence: Secondary | ICD-10-CM | POA: Insufficient documentation

## 2024-01-10 DIAGNOSIS — Z5111 Encounter for antineoplastic chemotherapy: Secondary | ICD-10-CM | POA: Diagnosis present

## 2024-01-10 DIAGNOSIS — R590 Localized enlarged lymph nodes: Secondary | ICD-10-CM | POA: Diagnosis not present

## 2024-01-10 DIAGNOSIS — Z510 Encounter for antineoplastic radiation therapy: Secondary | ICD-10-CM | POA: Insufficient documentation

## 2024-01-10 DIAGNOSIS — C3411 Malignant neoplasm of upper lobe, right bronchus or lung: Secondary | ICD-10-CM | POA: Diagnosis present

## 2024-01-10 DIAGNOSIS — K219 Gastro-esophageal reflux disease without esophagitis: Secondary | ICD-10-CM | POA: Insufficient documentation

## 2024-01-10 DIAGNOSIS — J449 Chronic obstructive pulmonary disease, unspecified: Secondary | ICD-10-CM | POA: Diagnosis not present

## 2024-01-10 DIAGNOSIS — D649 Anemia, unspecified: Secondary | ICD-10-CM | POA: Diagnosis not present

## 2024-01-10 DIAGNOSIS — Z7951 Long term (current) use of inhaled steroids: Secondary | ICD-10-CM | POA: Insufficient documentation

## 2024-01-10 DIAGNOSIS — I10 Essential (primary) hypertension: Secondary | ICD-10-CM | POA: Diagnosis not present

## 2024-01-10 DIAGNOSIS — D630 Anemia in neoplastic disease: Secondary | ICD-10-CM

## 2024-01-10 DIAGNOSIS — Z7984 Long term (current) use of oral hypoglycemic drugs: Secondary | ICD-10-CM | POA: Insufficient documentation

## 2024-01-10 LAB — VITAMIN B12: Vitamin B-12: >4000 pg/mL — ABNORMAL HIGH (ref 180–914)

## 2024-01-10 LAB — IRON AND TIBC
Iron: 86 ug/dL (ref 28–170)
Saturation Ratios: 27 % (ref 10.4–31.8)
TIBC: 316 ug/dL (ref 250–450)
UIBC: 231 ug/dL

## 2024-01-10 LAB — FERRITIN: Ferritin: 297 ng/mL (ref 11–307)

## 2024-01-10 LAB — FOLATE: Folate: 8.8 ng/mL (ref >5.9)

## 2024-01-10 MED ORDER — SODIUM CHLORIDE 0.9 % IV SOLN
1200.0000 mg | Freq: Once | INTRAVENOUS | Status: AC
  Administered 2024-01-10: 1200 mg via INTRAVENOUS
  Filled 2024-01-10: qty 20

## 2024-01-10 MED ORDER — SODIUM CHLORIDE 0.9 % IV SOLN: INTRAVENOUS | Status: DC

## 2024-01-10 MED ORDER — APREPITANT 130 MG/18ML IV EMUL
130.0000 mg | Freq: Once | INTRAVENOUS | Status: AC
  Administered 2024-01-10: 130 mg via INTRAVENOUS
  Filled 2024-01-10: qty 18

## 2024-01-10 MED ORDER — SODIUM CHLORIDE 0.9% FLUSH: 10.0000 mL | INTRAVENOUS | Status: DC | PRN

## 2024-01-10 MED ORDER — SODIUM CHLORIDE 0.9 % IV SOLN
100.0000 mg/m2 | Freq: Once | INTRAVENOUS | Status: AC
  Administered 2024-01-10: 200 mg via INTRAVENOUS
  Filled 2024-01-10: qty 10

## 2024-01-10 MED ORDER — DEXAMETHASONE SOD PHOSPHATE PF 10 MG/ML IJ SOLN
10.0000 mg | Freq: Once | INTRAMUSCULAR | Status: AC
  Administered 2024-01-10: 10 mg via INTRAVENOUS

## 2024-01-10 MED ORDER — ACETAMINOPHEN 325 MG PO TABS
650.0000 mg | ORAL_TABLET | Freq: Four times a day (QID) | ORAL | Status: DC | PRN
  Administered 2024-01-10: 650 mg via ORAL
  Filled 2024-01-10: qty 2

## 2024-01-10 MED ORDER — PALONOSETRON HCL INJECTION 0.25 MG/5ML
0.2500 mg | Freq: Once | INTRAVENOUS | Status: AC
  Administered 2024-01-10: 0.25 mg via INTRAVENOUS
  Filled 2024-01-10: qty 5

## 2024-01-10 MED ORDER — SODIUM CHLORIDE 0.9 % IV SOLN
428.0000 mg | Freq: Once | INTRAVENOUS | Status: AC
  Administered 2024-01-10: 430 mg via INTRAVENOUS
  Filled 2024-01-10: qty 43

## 2024-01-10 MED FILL — Etoposide Inj 1 GM/50ML (20 MG/ML): INTRAVENOUS | Qty: 10 | Status: AC

## 2024-01-10 NOTE — Patient Instructions (Signed)
 Atezolizumab  Injection What is this medication? ATEZOLIZUMAB  (a te zoe LIZ ue mab) treats some types of cancer. It works by helping your immune system slow or stop the spread of cancer cells. It is a monoclonal antibody. This medicine may be used for other purposes; ask your health care provider or pharmacist if you have questions. COMMON BRAND NAME(S): Tecentriq  What should I tell my care team before I take this medication? They need to know if you have any of these conditions: Allogeneic stem cell transplant (uses someone else's stem cells) Autoimmune diseases, such as Crohn disease, ulcerative colitis, lupus History of chest radiation Nervous system problems, such as Guillain-Barre syndrome, myasthenia gravis Organ transplant An unusual or allergic reaction to atezolizumab , other medications, foods, dyes, or preservatives Pregnant or trying to get pregnant Breast-feeding How should I use this medication? This medication is injected into a vein. It is given by your care team in a hospital or clinic setting. A special MedGuide will be given to you before each treatment. Be sure to read this information carefully each time. Talk to your care team about the use of this medication in children. While it may be prescribed for children as young as 2 years for selected conditions, precautions do apply. Overdosage: If you think you have taken too much of this medicine contact a poison control center or emergency room at once. NOTE: This medicine is only for you. Do not share this medicine with others. What if I miss a dose? Keep appointments for follow-up doses. It is important not to miss your dose. Call your care team if you are unable to keep an appointment. What may interact with this medication? Interactions have not been studied. This list may not describe all possible interactions. Give your health care provider a list of all the medicines, herbs, non-prescription drugs, or dietary  supplements you use. Also tell them if you smoke, drink alcohol , or use illegal drugs. Some items may interact with your medicine. What should I watch for while using this medication? Your condition will be monitored carefully while you are receiving this medication. You may need blood work done while you are taking this medication. This medication may cause serious skin reactions. They can happen weeks to months after starting the medication. Contact your care team right away if you notice fevers or flu-like symptoms with a rash. The rash may be red or purple and then turn into blisters or peeling of the skin. You may also notice a red rash with swelling of the face, lips, or lymph nodes in your neck or under your arms. Talk to your care team if you may be pregnant. Serious birth defects can occur if you take this medication during pregnancy and for 5 months after the last dose. You will need a negative pregnancy test before starting this medication. Contraception is recommended while taking this medication and for 5 months after the last dose. Your care team can help you find the option that works for you. Do not breastfeed while taking this medication and for 5 months after the last dose. This medication may cause infertility. Talk to your care team if you are concerned about your fertility. What side effects may I notice from receiving this medication? Side effects that you should report to your doctor or health care professional as soon as possible: Allergic reactions--skin rash, itching, hives, swelling of the face, lips, tongue, or throat Dry cough, shortness of breath or trouble breathing Eye pain, redness, irritation, or discharge  with blurry or decreased vision Heart muscle inflammation--unusual weakness or fatigue, shortness of breath, chest pain, fast or irregular heartbeat, dizziness, swelling of the ankles, feet, or hands Hormone gland problems--headache, sensitivity to light, unusual  weakness or fatigue, dizziness, fast or irregular heartbeat, increased sensitivity to cold or heat, excessive sweating, constipation, hair loss, increased thirst or amount of urine, tremors or shaking, irritability Infusion reactions--chest pain, shortness of breath or trouble breathing, feeling faint or lightheaded Kidney injury (glomerulonephritis)--decrease in the amount of urine, red or dark brown urine, foamy or bubbly urine, swelling of the ankles, hands, or feet Liver injury--right upper belly pain, loss of appetite, nausea, light-colored stool, dark yellow or brown urine, yellowing skin or eyes, unusual weakness or fatigue Pain, tingling, or numbness in the hands or feet, muscle weakness, change in vision, confusion or trouble speaking, loss of balance or coordination, trouble walking, seizures Rash, fever, and swollen lymph nodes Redness, blistering, peeling, or loosening of the skin, including inside the mouth Sudden or severe stomach pain, bloody diarrhea, fever, nausea, vomiting Side effects that usually do not require medical attention (report to your doctor or health care professional if they continue or are bothersome): Bone, joint, or muscle pain Diarrhea Fatigue Loss of appetite Nausea Skin rash This list may not describe all possible side effects. Call your doctor for medical advice about side effects. You may report side effects to FDA at 1-800-FDA-1088. Where should I keep my medication? This medication is given in a hospital or clinic. It will not be stored at home. NOTE: This sheet is a summary. It may not cover all possible information. If you have questions about this medicine, talk to your doctor, pharmacist, or health care provider.  2024 Elsevier/Gold Standard (2022-12-09 00:00:00)Carboplatin  Injection What is this medication? CARBOPLATIN  (KAR boe pla tin) treats some types of cancer. It works by slowing down the growth of cancer cells. This medicine may be used for  other purposes; ask your health care provider or pharmacist if you have questions. COMMON BRAND NAME(S): Paraplatin  What should I tell my care team before I take this medication? They need to know if you have any of these conditions: Blood disorders Hearing problems Kidney disease Recent or ongoing radiation therapy An unusual or allergic reaction to carboplatin , cisplatin, other medications, foods, dyes, or preservatives Pregnant or trying to get pregnant Breast-feeding How should I use this medication? This medication is injected into a vein. It is given by your care team in a hospital or clinic setting. Talk to your care team about the use of this medication in children. Special care may be needed. Overdosage: If you think you have taken too much of this medicine contact a poison control center or emergency room at once. NOTE: This medicine is only for you. Do not share this medicine with others. What if I miss a dose? Keep appointments for follow-up doses. It is important not to miss your dose. Call your care team if you are unable to keep an appointment. What may interact with this medication? Medications for seizures Some antibiotics, such as amikacin, gentamicin , neomycin, streptomycin, tobramycin Vaccines This list may not describe all possible interactions. Give your health care provider a list of all the medicines, herbs, non-prescription drugs, or dietary supplements you use. Also tell them if you smoke, drink alcohol , or use illegal drugs. Some items may interact with your medicine. What should I watch for while using this medication? Your condition will be monitored carefully while you are receiving this  medication. You may need blood work while taking this medication. This medication may make you feel generally unwell. This is not uncommon, as chemotherapy can affect healthy cells as well as cancer cells. Report any side effects. Continue your course of treatment even though  you feel ill unless your care team tells you to stop. In some cases, you may be given additional medications to help with side effects. Follow all directions for their use. This medication may increase your risk of getting an infection. Call your care team for advice if you get a fever, chills, sore throat, or other symptoms of a cold or flu. Do not treat yourself. Try to avoid being around people who are sick. Avoid taking medications that contain aspirin , acetaminophen , ibuprofen, naproxen, or ketoprofen unless instructed by your care team. These medications may hide a fever. Be careful brushing or flossing your teeth or using a toothpick because you may get an infection or bleed more easily. If you have any dental work done, tell your dentist you are receiving this medication. Talk to your care team if you wish to become pregnant or think you might be pregnant. This medication can cause serious birth defects. Talk to your care team about effective forms of contraception. Do not breast-feed while taking this medication. What side effects may I notice from receiving this medication? Side effects that you should report to your care team as soon as possible: Allergic reactions--skin rash, itching, hives, swelling of the face, lips, tongue, or throat Infection--fever, chills, cough, sore throat, wounds that don't heal, pain or trouble when passing urine, general feeling of discomfort or being unwell Low red blood cell level--unusual weakness or fatigue, dizziness, headache, trouble breathing Pain, tingling, or numbness in the hands or feet, muscle weakness, change in vision, confusion or trouble speaking, loss of balance or coordination, trouble walking, seizures Unusual bruising or bleeding Side effects that usually do not require medical attention (report to your care team if they continue or are bothersome): Hair loss Nausea Unusual weakness or fatigue Vomiting This list may not describe all  possible side effects. Call your doctor for medical advice about side effects. You may report side effects to FDA at 1-800-FDA-1088. Where should I keep my medication? This medication is given in a hospital or clinic. It will not be stored at home. NOTE: This sheet is a summary. It may not cover all possible information. If you have questions about this medicine, talk to your doctor, pharmacist, or health care provider.  2024 Elsevier/Gold Standard (2021-06-17 00:00:00)Etoposide  Injection What is this medication? ETOPOSIDE  (e toe POE side) treats some types of cancer. It works by slowing down the growth of cancer cells. This medicine may be used for other purposes; ask your health care provider or pharmacist if you have questions. COMMON BRAND NAME(S): Etopophos, Toposar , VePesid  What should I tell my care team before I take this medication? They need to know if you have any of these conditions: Infection Kidney disease Liver disease Low blood counts, such as low white cell, platelet, red cell counts An unusual or allergic reaction to etoposide , other medications, foods, dyes, or preservatives If you or your partner are pregnant or trying to get pregnant Breastfeeding How should I use this medication? This medication is injected into a vein. It is given by your care team in a hospital or clinic setting. Talk to your care team about the use of this medication in children. Special care may be needed. Overdosage: If you think you  have taken too much of this medicine contact a poison control center or emergency room at once. NOTE: This medicine is only for you. Do not share this medicine with others. What if I miss a dose? Keep appointments for follow-up doses. It is important not to miss your dose. Call your care team if you are unable to keep an appointment. What may interact with this medication? Warfarin This list may not describe all possible interactions. Give your health care provider  a list of all the medicines, herbs, non-prescription drugs, or dietary supplements you use. Also tell them if you smoke, drink alcohol , or use illegal drugs. Some items may interact with your medicine. What should I watch for while using this medication? Your condition will be monitored carefully while you are receiving this medication. This medication may make you feel generally unwell. This is not uncommon as chemotherapy can affect healthy cells as well as cancer cells. Report any side effects. Continue your course of treatment even though you feel ill unless your care team tells you to stop. This medication can cause serious side effects. To reduce the risk, your care team may give you other medications to take before receiving this one. Be sure to follow the directions from your care team. This medication may increase your risk of getting an infection. Call your care team for advice if you get a fever, chills, sore throat, or other symptoms of a cold or flu. Do not treat yourself. Try to avoid being around people who are sick. This medication may increase your risk to bruise or bleed. Call your care team if you notice any unusual bleeding. Talk to your care team about your risk of cancer. You may be more at risk for certain types of cancers if you take this medication. Talk to your care team if you may be pregnant. Serious birth defects can occur if you take this medication during pregnancy and for 6 months after the last dose. You will need a negative pregnancy test before starting this medication. Contraception is recommended while taking this medication and for 6 months after the last dose. Your care team can help you find the option that works for you. If your partner can get pregnant, use a condom during sex while taking this medication and for 4 months after the last dose. Do not breastfeed while taking this medication. This medication may cause infertility. Talk to your care team if you are  concerned about your fertility. What side effects may I notice from receiving this medication? Side effects that you should report to your care team as soon as possible: Allergic reactions--skin rash, itching, hives, swelling of the face, lips, tongue, or throat Infection--fever, chills, cough, sore throat, wounds that don't heal, pain or trouble when passing urine, general feeling of discomfort or being unwell Low red blood cell level--unusual weakness or fatigue, dizziness, headache, trouble breathing Unusual bruising or bleeding Side effects that usually do not require medical attention (report to your care team if they continue or are bothersome): Diarrhea Fatigue Hair loss Loss of appetite Nausea Vomiting This list may not describe all possible side effects. Call your doctor for medical advice about side effects. You may report side effects to FDA at 1-800-FDA-1088. Where should I keep my medication? This medication is given in a hospital or clinic. It will not be stored at home. NOTE: This sheet is a summary. It may not cover all possible information. If you have questions about this medicine, talk to  your doctor, pharmacist, or health care provider.  2024 Elsevier/Gold Standard (2021-07-17 00:00:00)

## 2024-01-10 NOTE — Progress Notes (Addendum)
..  Pharmacist Chemotherapy Monitoring - Initial Assessment    Anticipated start date: 01/10/2024  History of breast cancer and prior chemotherapy with AC x 4 cycles in 2005.  Newly diagnosed with ES-SCLC.  Dr. Cornelius wants allopurinol for the 1st month due to bulky disease.  Port placement: 10/31; port tip was in the distal superior vena cava/right atrial junction.  The vena cava is pushed to the left of medline due to chest mass/adenopathy.  Patient is currently taking prednisone 20 mg.  Per note:  Bruna was started on a course of Prednisone 40 mg daily on 10/25 for 7 days since in the hospital. I explained that we will need to taper her down to Prednisone 20 mg daily for the next week to allow a slower taper, and we will stop it once she starts immunotherapy.   The following has been reviewed per standard work regarding the patient's treatment regimen: The patient's diagnosis, treatment plan and drug doses, and organ/hematologic function Lab orders and baseline tests specific to treatment regimen  The treatment plan start date, drug sequencing, and pre-medications Prior authorization status  Patient's documented medication list, including drug-drug interaction screen and prescriptions for anti-emetics and supportive care specific to the treatment regimen The drug concentrations, fluid compatibility, administration routes, and timing of the medications to be used The patient's access for treatment and lifetime cumulative dose history, if applicable  The patient's medication allergies and previous infusion related reactions, if applicable   Changes made to treatment plan:  N/A  Follow up needed:  N/A   Devere CHRISTELLA Manzanilla, [facility], 01/10/2024  8:33 AM

## 2024-01-10 NOTE — Transitions of Care (Post Inpatient/ED Visit) (Signed)
  Transition of Care week 2  Visit Note  01/10/2024  Name: Aariona Momon MRN: [id number]          DOB: [birth year]  Situation: Patient enrolled in [facility] 30-day program. Visit completed with patient by telephone.   Background: Admit/Discharge Date  10/22 tx from [facility] - [facility] Primary Diagnosis: Mass of right lung    Initial Transition Care Management Follow-up Telephone Call Discharge Date and Diagnosis: 12/31/23, Mass of right lung   Past Medical History:  Diagnosis Date   Advanced nonexudative age-related macular degeneration of right eye with subfoveal involvement 09/21/2019   Advanced nonexudative age-related macular degeneration of right eye without subfoveal involvement 09/21/2019   Allergic rhinitis    Angina pectoris 05/17/2020   Bilateral breast cancer (HCC) 11/23/2013   BMI 34.0-34.9,adult 09/25/2019   Breast cancer (HCC)    Bronchitis, chronic (HCC)    CAD (coronary artery disease)    Cancer (HCC)    Cardiac murmur 05/17/2020   Coronary artery calcification 05/17/2020   Degenerative retinal drusen of right eye 09/21/2019   Diabetes mellitus due to underlying condition with unspecified complications (HCC) 05/17/2020   Diverticulosis    Dry eyes 06/25/2016   Dyspnea    Emphysema/COPD    Essential hypertension 05/17/2020   Estrogen receptor positive neoplasm 09/24/2015   Family history of malignant neoplasm of breast 11/23/2013   Family history of peritoneal cancer 11/23/2013   Fuchs' corneal dystrophy 11/03/2016   GERD (gastroesophageal reflux disease)    History of colon polyps    History of right breast cancer 09/19/2018   Hypertension    IBS (irritable bowel syndrome)    with D   Intermediate stage nonexudative age-related macular degeneration of left eye 09/21/2019   Iritis 11/03/2016   Macular degeneration    Major depressive disorder    Malignant neoplasm of upper-inner quadrant of left breast in female, estrogen receptor  positive (HCC) 09/01/2016   Mixed dyslipidemia 05/17/2020   Obesity    Obesity (BMI 35.0-39.9 without comorbidity) 05/17/2020   OSA on CPAP 10/29/2020   Diagnosed some 3 months prior after pneumonia episode, Dr. Shellia pulmonology   Peripheral vascular disease    Personal history of chemotherapy    Personal history of radiation therapy    Pseudophakia 09/21/2019   Right-sided chest wall pain    SVT (supraventricular tachycardia) 05/23/2020   Varicose veins of left lower extremity with complications 08/11/2016    Assessment: Unable to complete assessments or medication review - patient was getting chemo at time of call and nurse needed her   Recommendation:   Continue Current Plan of Care  Follow Up Plan:   Telephone follow up appointment date/time:  01/13/24 pm Shona Prow RN, CCM [place]  [facility] RN Care Manager [phone]

## 2024-01-11 ENCOUNTER — Inpatient Hospital Stay

## 2024-01-11 ENCOUNTER — Other Ambulatory Visit: Payer: Self-pay

## 2024-01-11 ENCOUNTER — Encounter: Payer: Self-pay | Admitting: Oncology

## 2024-01-11 ENCOUNTER — Other Ambulatory Visit: Payer: Self-pay | Admitting: Oncology

## 2024-01-11 ENCOUNTER — Other Ambulatory Visit: Payer: Self-pay | Admitting: Pharmacist

## 2024-01-11 VITALS — BP 130/59 | HR 79 | Temp 98.0°F | Resp 18

## 2024-01-11 DIAGNOSIS — E871 Hypo-osmolality and hyponatremia: Secondary | ICD-10-CM | POA: Diagnosis not present

## 2024-01-11 DIAGNOSIS — C3411 Malignant neoplasm of upper lobe, right bronchus or lung: Secondary | ICD-10-CM | POA: Diagnosis not present

## 2024-01-11 DIAGNOSIS — Z5111 Encounter for antineoplastic chemotherapy: Secondary | ICD-10-CM | POA: Diagnosis not present

## 2024-01-11 DIAGNOSIS — Z510 Encounter for antineoplastic radiation therapy: Secondary | ICD-10-CM | POA: Diagnosis not present

## 2024-01-11 DIAGNOSIS — C3431 Malignant neoplasm of lower lobe, right bronchus or lung: Secondary | ICD-10-CM

## 2024-01-11 DIAGNOSIS — C3430 Malignant neoplasm of lower lobe, unspecified bronchus or lung: Secondary | ICD-10-CM

## 2024-01-11 DIAGNOSIS — C787 Secondary malignant neoplasm of liver and intrahepatic bile duct: Secondary | ICD-10-CM | POA: Diagnosis not present

## 2024-01-11 MED ORDER — SODIUM CHLORIDE 0.9 % IV SOLN: INTRAVENOUS | Status: DC

## 2024-01-11 MED ORDER — SODIUM CHLORIDE 0.9 % IV SOLN
100.0000 mg/m2 | Freq: Once | INTRAVENOUS | Status: AC
  Administered 2024-01-11: 200 mg via INTRAVENOUS
  Filled 2024-01-11: qty 10

## 2024-01-11 MED ORDER — SODIUM CHLORIDE 0.9 % IV SOLN
100.0000 mg/m2 | Freq: Once | INTRAVENOUS | Status: AC
  Administered 2024-01-12: 200 mg via INTRAVENOUS
  Filled 2024-01-11: qty 10

## 2024-01-11 MED ORDER — DEXAMETHASONE SOD PHOSPHATE PF 10 MG/ML IJ SOLN
10.0000 mg | Freq: Once | INTRAMUSCULAR | Status: AC
  Administered 2024-01-11: 10 mg via INTRAVENOUS

## 2024-01-11 NOTE — Patient Instructions (Signed)
Etoposide Injection What is this medication? ETOPOSIDE (e toe POE side) treats some types of cancer. It works by slowing down the growth of cancer cells. This medicine may be used for other purposes; ask your health care provider or pharmacist if you have questions. COMMON BRAND NAME(S): Etopophos, Toposar, VePesid What should I tell my care team before I take this medication? They need to know if you have any of these conditions: Infection Kidney disease Liver disease Low blood counts, such as low white cell, platelet, red cell counts An unusual or allergic reaction to etoposide, other medications, foods, dyes, or preservatives If you or your partner are pregnant or trying to get pregnant Breastfeeding How should I use this medication? This medication is injected into a vein. It is given by your care team in a hospital or clinic setting. Talk to your care team about the use of this medication in children. Special care may be needed. Overdosage: If you think you have taken too much of this medicine contact a poison control center or emergency room at once. NOTE: This medicine is only for you. Do not share this medicine with others. What if I miss a dose? Keep appointments for follow-up doses. It is important not to miss your dose. Call your care team if you are unable to keep an appointment. What may interact with this medication? Warfarin This list may not describe all possible interactions. Give your health care provider a list of all the medicines, herbs, non-prescription drugs, or dietary supplements you use. Also tell them if you smoke, drink alcohol, or use illegal drugs. Some items may interact with your medicine. What should I watch for while using this medication? Your condition will be monitored carefully while you are receiving this medication. This medication may make you feel generally unwell. This is not uncommon as chemotherapy can affect healthy cells as well as cancer  cells. Report any side effects. Continue your course of treatment even though you feel ill unless your care team tells you to stop. This medication can cause serious side effects. To reduce the risk, your care team may give you other medications to take before receiving this one. Be sure to follow the directions from your care team. This medication may increase your risk of getting an infection. Call your care team for advice if you get a fever, chills, sore throat, or other symptoms of a cold or flu. Do not treat yourself. Try to avoid being around people who are sick. This medication may increase your risk to bruise or bleed. Call your care team if you notice any unusual bleeding. Talk to your care team about your risk of cancer. You may be more at risk for certain types of cancers if you take this medication. Talk to your care team if you may be pregnant. Serious birth defects can occur if you take this medication during pregnancy and for 6 months after the last dose. You will need a negative pregnancy test before starting this medication. Contraception is recommended while taking this medication and for 6 months after the last dose. Your care team can help you find the option that works for you. If your partner can get pregnant, use a condom during sex while taking this medication and for 4 months after the last dose. Do not breastfeed while taking this medication. This medication may cause infertility. Talk to your care team if you are concerned about your fertility. What side effects may I notice from receiving this medication?  Side effects that you should report to your care team as soon as possible: Allergic reactions--skin rash, itching, hives, swelling of the face, lips, tongue, or throat Infection--fever, chills, cough, sore throat, wounds that don't heal, pain or trouble when passing urine, general feeling of discomfort or being unwell Low red blood cell level--unusual weakness or fatigue,  dizziness, headache, trouble breathing Unusual bruising or bleeding Side effects that usually do not require medical attention (report to your care team if they continue or are bothersome): Diarrhea Fatigue Hair loss Loss of appetite Nausea Vomiting This list may not describe all possible side effects. Call your doctor for medical advice about side effects. You may report side effects to FDA at 1-800-FDA-1088. Where should I keep my medication? This medication is given in a hospital or clinic. It will not be stored at home. NOTE: This sheet is a summary. It may not cover all possible information. If you have questions about this medicine, talk to your doctor, pharmacist, or health care provider.  2024 [employer]/Gold Standard (2021-07-17 00:00:00)

## 2024-01-12 ENCOUNTER — Inpatient Hospital Stay

## 2024-01-12 ENCOUNTER — Ambulatory Visit
Admission: RE | Admit: 2024-01-12 | Discharge: 2024-01-12 | Disposition: A | Discharge status: Home / Self Care | Source: Ambulatory Visit | Attending: Radiation Oncology | Admitting: Radiation Oncology

## 2024-01-12 ENCOUNTER — Inpatient Hospital Stay: Admitting: Hematology and Oncology

## 2024-01-12 VITALS — BP 127/71 | HR 74 | Temp 97.7°F | Resp 18

## 2024-01-12 DIAGNOSIS — C3430 Malignant neoplasm of lower lobe, unspecified bronchus or lung: Secondary | ICD-10-CM

## 2024-01-12 DIAGNOSIS — Z7984 Long term (current) use of oral hypoglycemic drugs: Secondary | ICD-10-CM | POA: Insufficient documentation

## 2024-01-12 DIAGNOSIS — R590 Localized enlarged lymph nodes: Secondary | ICD-10-CM | POA: Insufficient documentation

## 2024-01-12 DIAGNOSIS — Z860100 Personal history of colon polyps, unspecified: Secondary | ICD-10-CM | POA: Insufficient documentation

## 2024-01-12 DIAGNOSIS — Z7951 Long term (current) use of inhaled steroids: Secondary | ICD-10-CM | POA: Insufficient documentation

## 2024-01-12 DIAGNOSIS — E871 Hypo-osmolality and hyponatremia: Secondary | ICD-10-CM | POA: Diagnosis not present

## 2024-01-12 DIAGNOSIS — Z87891 Personal history of nicotine dependence: Secondary | ICD-10-CM | POA: Insufficient documentation

## 2024-01-12 DIAGNOSIS — I871 Compression of vein: Secondary | ICD-10-CM | POA: Insufficient documentation

## 2024-01-12 DIAGNOSIS — K219 Gastro-esophageal reflux disease without esophagitis: Secondary | ICD-10-CM | POA: Insufficient documentation

## 2024-01-12 DIAGNOSIS — C3411 Malignant neoplasm of upper lobe, right bronchus or lung: Secondary | ICD-10-CM | POA: Diagnosis not present

## 2024-01-12 DIAGNOSIS — Z853 Personal history of malignant neoplasm of breast: Secondary | ICD-10-CM | POA: Insufficient documentation

## 2024-01-12 DIAGNOSIS — I2510 Atherosclerotic heart disease of native coronary artery without angina pectoris: Secondary | ICD-10-CM | POA: Insufficient documentation

## 2024-01-12 DIAGNOSIS — E782 Mixed hyperlipidemia: Secondary | ICD-10-CM | POA: Insufficient documentation

## 2024-01-12 DIAGNOSIS — Z5111 Encounter for antineoplastic chemotherapy: Secondary | ICD-10-CM | POA: Diagnosis not present

## 2024-01-12 DIAGNOSIS — Z510 Encounter for antineoplastic radiation therapy: Secondary | ICD-10-CM | POA: Insufficient documentation

## 2024-01-12 DIAGNOSIS — I10 Essential (primary) hypertension: Secondary | ICD-10-CM | POA: Insufficient documentation

## 2024-01-12 DIAGNOSIS — J449 Chronic obstructive pulmonary disease, unspecified: Secondary | ICD-10-CM | POA: Insufficient documentation

## 2024-01-12 DIAGNOSIS — Z803 Family history of malignant neoplasm of breast: Secondary | ICD-10-CM | POA: Insufficient documentation

## 2024-01-12 DIAGNOSIS — C3491 Malignant neoplasm of unspecified part of right bronchus or lung: Secondary | ICD-10-CM | POA: Insufficient documentation

## 2024-01-12 DIAGNOSIS — C787 Secondary malignant neoplasm of liver and intrahepatic bile duct: Secondary | ICD-10-CM | POA: Diagnosis not present

## 2024-01-12 DIAGNOSIS — Z79899 Other long term (current) drug therapy: Secondary | ICD-10-CM | POA: Insufficient documentation

## 2024-01-12 DIAGNOSIS — C342 Malignant neoplasm of middle lobe, bronchus or lung: Secondary | ICD-10-CM | POA: Diagnosis not present

## 2024-01-12 MED ORDER — DEXAMETHASONE SOD PHOSPHATE PF 10 MG/ML IJ SOLN
10.0000 mg | Freq: Once | INTRAMUSCULAR | Status: AC
  Administered 2024-01-12: 10 mg via INTRAVENOUS

## 2024-01-12 MED ORDER — SODIUM CHLORIDE 0.9 % IV SOLN: INTRAVENOUS | Status: DC

## 2024-01-12 NOTE — Progress Notes (Signed)
 Heather [facility]  [address] [place],  [place]  [address] [phone]  Clinic Day:  01/12/2024  Referring physician: [name], MD   ASSESSMENT & PLAN:  Assessment:  Small Cell Lung Cancer This is newly diagnosed but already at least a stage IIIB based on her significant mediastinal adenopathy, pleural based mass, and small pleural effusion. Hopefully she will not have metastatic disease but we are waiting on the staging scans. Either way she will need chemotherapy and immunotherapy so we will get a port placed as soon as possible and start her treatment next week. I will let her know the results of her PET scan and brain MRI when available.    Superior Vena Cava Syndrome She has edema of her upper extremities, neck, and face. We therefore have an urgency to get started as soon as possible. This has increased today and she notes increasing shortness of breath as well as increased swelling in her upper extremities.   Hyponatremia This is likely caused by her small cell lung cancer and will be monitored closely. Hopefully it will respond to treatment.   Pneumonia This is multifocal and at least partially related to post obstruction of the right lower main bronchus.   Anemia This is likely related to her new small cell lung cancer and bilateral pneumonia but I will check her vitamin levels.  History of Stage IA (T1b N0 M0) hormone receptor positive right breast cancer This was diagnosed in April, 2005. She was treated with a lumpectomy and pathology revealed a 6mm, grade 3, invasive ductal carcinoma with sentinel node positive with a 3mm micrometastasis. Estrogen and progesterone receptors were positive and her 2 neu negative. She was treated with 4 cycles of doxorubicin and cyclophosphamide. She was then placed on Anastrozole in November, 2005 and took that for 3.69yrs, but was switched to Tamoxifen in April, 2009 because of her arthralgias related to Anastrozole. She  completed 5 years of adjuvant hormonal therapy.    History of Stage IA (T1b N0 M0) hormone receptor positive left breast cancer This was diagnosed in July, 2015. This was treated with lumpectomy and pathology revealed a 6mm, grade 1, ductal carcinoma with negative sentinel node. Estrogen and progesterone receptors were positive and her 2 neu negative and Ki 67 was 18%. She received adjuvant radiation to the left breast and was then placed on Anastrozole again in August, 2015. This was stopped in October, 2016 due to severe hot flashes, anxiety, and depression, mild cognitive difficulty, trouble finding words, and remembering things as well as becoming easily overwhelmed. She did not tolerate venlafaxine and was placed on paroxetine 20mg  daily in September, 2015 and this improved her symptoms. She was later changed to Lexapro. She was placed on Tamoxifen 20 mg daily for adjuvant hormonal therapy for 5 years.   Plan: She started treatment for SCLC today and notes increased swelling from SVC. She states she has had more shortness of breath as well as not being able to bend her arms due to swelling. She will see [facility] today for evaluation and sim. We will get her started ASAP.  Thank you for the opportunity to care for your patients.   I provided 30 minutes of face-to-face time during this this encounter and > 50% was spent counseling as documented under my assessment and plan.   Heather DELENA Bach, NP [facility] - A DEPT OF Heather Ch[facility]. [name] [address] [place]  [place] [address] Dept: [phone] Dept Fax: [phone]   No orders of the defined types were placed in this encounter.    CHIEF COMPLAINT:  CC: Small Cell Lung Cancer  Current Treatment:  Chemotherapy and Immunotherapy   HISTORY OF PRESENT ILLNESS:  Heather Chavez is a 78 y.o. female with a history of bilateral breast cancer who is referred in consultation by Dr. Arland Chavez for assessment and management of newly diagnosed small cell lung cancer. She has a history of stage 1A right breast cancer 06/2003 (s/p lumpectomy, doxorubicin + cyclophosphamide, and 5 years of adjuvant hormonal therapy with tamoxifen/anastrozole), stage 1A left breast cancer (s/p lumpectomy, adjuvant radiation, and 5 years of adjuvant hormonal therapy with tamoxifen/anastrozole), COPD/asthma overlap, and tobacco use.  She underwent testing for hereditary breast and ovarian cancer with the [employer] OvaNext 25 gene panel, which did not reveal any clinically significant mutation. She did have a variant of uncertain significance of MSH2. She had a previous hysterectomy and unilateral oophorectomy for benign disease at a fairly young age, so does not require pap smears. Screening colonoscopy in February, 2017 revealed multiple polyps, all of which were benign polypoid colonic mucosa.  Heather Chavez was admitted into the [facility] ED on October 20th, 2025 and presented with shortness of breath, fevers, and chills. She received a chest x-ray for further evaluation which revealed an right upper lobe infiltrate versus mass and chest CT angio revealed emphysema with chronic interstitial changes in both lung fields corresponding with multifocal pneumonitis and a suspicious pleural based mass in the right upper lobe measuring 3.9cm, and multiple suspicious enlarged mediastinal lymph nodes.  She was diagnosed with mass of the right lung, acute hypoxic respiratory failure, and bilateral community acquired pneumonia before being transferred to [facility]. She then had bronchoscopy and biopsy and pathology report was positive for small cell carcinoma with positive tumor cells for TTF-1 and neuroendocrine markers CD56, synaptophysin, and chromogranin. GATA-3 is negative and Pan keratin showed dot-like positive staining. She comes to me for discussion of staging and treatment and had a PET scan and brain MRI  today but results are pending. I recommend systemic chemotherapy with Carboplatin and Etopiside. She will also have immunotherapy, either concurrent or sequential depending on her staging. She will have her chemoeducation tomorrow and port placed this Friday.   I have reviewed her chart and materials related to her cancer extensively and collaborated history with the patient. Summary of oncologic history is as follows: Oncology History  Lung cancer, lower lobe (HCC)  01/05/2024 Initial Diagnosis   Lung cancer, lower lobe (HCC)   01/06/2024 Cancer Staging   Staging form: Lung, AJCC V9 - Clinical stage from 01/06/2024: Stage IVB (cT4, cN2b, cM1c1) - Signed by Cornelius Wanda DEL, MD on 01/06/2024 Histopathologic type: Small cell carcinoma, NOS Stage prefix: Initial diagnosis Method of lymph node assessment: Clinical IASLC grade: G3 Histologic grading system: 3 grade system Laterality: Right Tumor size (mm): 56 Sites of metastasis: Liver Lymph-vascular invasion (LVI): Presence of LVI unknown/indeterminate Diagnostic confirmation: Positive histology Specimen type: Bronchial Biopsy Staged by: Managing physician Standard uptake value (SUV): 12.5 Perineural invasion (PNI): Unknown Pleural/elastic layer invasion: Unknown Stage used in treatment planning: Yes National guidelines used in treatment planning: Yes Type of national guideline used in treatment planning: NCCN   01/10/2024 -  Chemotherapy   Patient is on Treatment Plan : LUNG SCLC Carboplatin + Etoposide + Atezolizumab Induction q21d x 4 cycles / Atezolizumab Maintenance q21d  INTERVAL HISTORY:  Heather Chavez is seen in the clinic for follow up of her newly diagnosed small cell carcinoma. I explained her diagnosis to her, her daughter, and granddaughter in detail. I informed her that this is a fast growing malignancy but has good response with chemotherapy. However, we are waiting for a final staging based on the PET and brain MRI.  She had her PET scan and MRI of the brain this morning. I recommend systemic chemotherapy with Carboplatin and Etopiside. She will also have immunotherapy, either concurrent or sequential depending on her staging. She will probably not be a candidate for radiation due to prior breast radiation bilaterally as well as the fact that she has an pleural based mass and small pleural effusion. She presents after her bronchoscopy and is now on home oxygen  3L per nasal canula. We will plan on 4 cycles of chemotherapy and then immunotherapy right after. I informed her of possible side effects from chemotherapy and immunotherapy such as fatigue, nausea, hair loss, low blood counts, skin rashes, colitis, and thyroid  dysfunction. I advised her to avoid crowded areas and if she develops an illness or infection to contact my office. I mentioned that she will need a port put in place for treatment, she previously had 1 port in before. She will see Dr. Bert tomorrow for consultation and port placement will be scheduled Friday. She will also be scheduled for chemoeducation tomorrow afternoon with NP Elden Brucato. Bruna was started on a course of Prednisone 40mg  daily on 10/25 for 7 days since in the hospital. I explained that we will need to taper her down to Prednisone 20mg  daily for the next week to allow a slower taper, and we will stop it once she starts immunotherapy. I will place her on allopurinol 300 mg daily in view of her bulky disease. . I did recommend she get her vaccines for flu, COVID, and RSV. We will also stop her Losartan/hydrochlorothiazide.  Patient states that she feels ok but complains of diarrhea, head pressure, vision changes, congestion, and a tolerable cough with some phlegm. She had labs done on 12/31/2023 and had a WBC of 8.0, low hemoglobin of 9.1, and platelet count of 276,000, she also had a low sodium of 130 at the time. We will plan to start her chemotherapy next week and I will see her back 10 days  after her first dose with CBC and CMP.    She denies fever, chills, night sweats, or other signs of infection. She denies cardiorespiratory issues. She  denies pain. Her appetite is good. Her weight is 212lbs. This patient is accompanied in the office by her daughter and granddaughter.   HISTORY:   Past Medical History:  Diagnosis Date   Advanced nonexudative age-related macular degeneration of right eye with subfoveal involvement 09/21/2019   Advanced nonexudative age-related macular degeneration of right eye without subfoveal involvement 09/21/2019   Allergic rhinitis    Angina pectoris 05/17/2020   Bilateral breast cancer (HCC) 11/23/2013   BMI 34.0-34.9,adult 09/25/2019   Breast cancer (HCC)    Bronchitis, chronic (HCC)    CAD (coronary artery disease)    Cancer (HCC)    Cardiac murmur 05/17/2020   Coronary artery calcification 05/17/2020   Degenerative retinal drusen of right eye 09/21/2019   Diabetes mellitus due to underlying condition with unspecified complications (HCC) 05/17/2020   Diverticulosis    Dry eyes 06/25/2016   Dyspnea    Emphysema/COPD    Essential hypertension 05/17/2020   Estrogen receptor positive  neoplasm 09/24/2015   Family history of malignant neoplasm of breast 11/23/2013   Family history of peritoneal cancer 11/23/2013   Fuchs' corneal dystrophy 11/03/2016   GERD (gastroesophageal reflux disease)    History of colon polyps    History of right breast cancer 09/19/2018   Hypertension    IBS (irritable bowel syndrome)    with D   Intermediate stage nonexudative age-related macular degeneration of left eye 09/21/2019   Iritis 11/03/2016   Macular degeneration    Major depressive disorder    Malignant neoplasm of upper-inner quadrant of left breast in female, estrogen receptor positive (HCC) 09/01/2016   Mixed dyslipidemia 05/17/2020   Obesity    Obesity (BMI 35.0-39.9 without comorbidity) 05/17/2020   OSA on CPAP 10/29/2020   Diagnosed some 3  months prior after pneumonia episode, Dr. Shellia pulmonology   Peripheral vascular disease    Personal history of chemotherapy    Personal history of radiation therapy    Pseudophakia 09/21/2019   Right-sided chest wall pain    SVT (supraventricular tachycardia) 05/23/2020   Varicose veins of left lower extremity with complications 08/11/2016   Past Surgical History:  Procedure Laterality Date   BREAST BIOPSY     BREAST LUMPECTOMY     right brwast 2005/ left 2015   CATARACT EXTRACTION     CHOLECYSTECTOMY     COLONOSCOPY  04/17/2015   Colonic polyps status post polypectomy. Moderate to severe sigmoid diverticulosis   ENDOVENOUS ABLATION SAPHENOUS VEIN W/ LASER Right 02/19/2023   endovenous laser ablation right greater saphenous vein and stab phlebectomy 10-20 incisions right leg by Gaile New MD   TUBAL LIGATION     VAGINAL HYSTERECTOMY     VEIN LIGATION AND STRIPPING     Family History  Problem Relation Age of Onset   Cancer Mother 85       bilateral breast cancer ages 22 then 69. Peritoneal cancer at age 55.   Other Mother        Ms. Kastens's mother was adopted so have no information regarding maternal family  history other than her mother   Breast cancer Mother    Cancer Father 56       renal   Cancer Sister 63       breast   Breast cancer Sister    Breast cancer Daughter    Breast cancer Maternal Aunt    Breast cancer Paternal Aunt    Breast cancer Paternal Grandmother    Cancer Paternal Grandmother        bilateral breast cancer at unknown age   Social History:  reports that she quit smoking about 42 years ago. Her smoking use included cigarettes. She started smoking about 57 years ago. She has a 37.5 pack-year smoking history. She has never used smokeless tobacco. She reports current alcohol use. She reports that she does not use drugs.The patient is accompanied by her daughter and granddaughter today.  Allergies:  Allergies  Allergen Reactions   Codeine Nausea  And Vomiting and Other (See Comments)    Other reaction(s): Other (see comments), Vomiting   Levofloxacin Other (See Comments)    Leg cramps   Nexlizet  [Bempedoic Acid-Ezetimibe ] Other (See Comments)   Pitavastatin  Other (See Comments)    Leg cramps  Other Reaction(s): Other (See Comments)  Leg cramps    Leg cramps   Rosuvastatin  Other (See Comments)    Muscle pain    Current Medications: Current Outpatient Medications  Medication Sig Dispense Refill  albuterol (VENTOLIN HFA) 108 (90 Base) MCG/ACT inhaler Inhale 2 puffs into the lungs every 4 (four) hours as needed for wheezing.     allopurinol (ZYLOPRIM) 300 MG tablet Take 1 tablet (300 mg total) by mouth daily. 30 tablet 0   Biotin 89999 MCG TABS Take 10,000 mcg by mouth daily.     Boswellia-Glucosamine-Vit D (OSTEO BI-FLEX ONE PER DAY) TABS Take by mouth.     budesonide -formoterol  (SYMBICORT ) 80-4.5 MCG/ACT inhaler Inhale 2 puffs into the lungs 2 (two) times daily.     dextromethorphan-guaiFENesin (ROBITUSSIN-DM) 10-100 MG/5ML liquid Take 5 mLs by mouth every 6 (six) hours as needed for cough.     fexofenadine (ALLEGRA) 180 MG tablet Take 180 mg by mouth.     fluconazole (DIFLUCAN) 150 MG tablet Take 1 tablet (150 mg total) by mouth daily. 3 tablet 2   FLUoxetine (PROZAC) 20 MG capsule Take 20 mg by mouth daily.     furosemide (LASIX) 40 MG tablet Take 40 mg by mouth.     gabapentin (NEURONTIN) 600 MG tablet Take 600 mg by mouth 2 (two) times daily.     GARLIC PO Take 1 tablet by mouth daily.     ipratropium-albuterol (DUONEB) 0.5-2.5 (3) MG/3ML SOLN Take 3 mLs by nebulization every 4 (four) hours as needed (breathing treatment).     LORazepam  (ATIVAN ) 0.5 MG tablet Take by mouth.     losartan-hydrochlorothiazide (HYZAAR) 50-12.5 MG tablet Take 1 tablet by mouth daily.     metFORMIN (GLUCOPHAGE) 500 MG tablet Take 500 mg by mouth daily.     metoprolol  tartrate (LOPRESSOR ) 25 MG tablet Take 25 mg by mouth 2 (two) times daily.      montelukast (SINGULAIR) 10 MG tablet Take 10 mg by mouth at bedtime.     Multiple Vitamins-Minerals (PRESERVISION AREDS 2 PO) Take by mouth.     nitroGLYCERIN  (NITROSTAT ) 0.4 MG SL tablet Place 0.4 mg under the tongue every 5 (five) minutes as needed for chest pain.     ondansetron  (ZOFRAN ) 4 MG tablet Take 1 tablet (4 mg total) by mouth every 4 (four) hours as needed for nausea. 90 tablet 3   ondansetron  (ZOFRAN -ODT) 4 MG disintegrating tablet Take 4 mg by mouth.     pantoprazole  (PROTONIX ) 40 MG tablet Take 1 tablet (40 mg total) by mouth daily. Call [phone] to schedule an office visit for more refills 90 tablet 0   potassium chloride SA (KLOR-CON M) 20 MEQ tablet Take 1 tablet (20 mEq total) by mouth daily. 30 tablet 5   prochlorperazine (COMPAZINE) 10 MG tablet Take 1 tablet (10 mg total) by mouth every 6 (six) hours as needed for nausea or vomiting. 90 tablet 3   vitamin E 180 MG (400 UNITS) capsule Take 400 Units by mouth daily.     zinc gluconate 50 MG tablet Take 50 mg by mouth daily.     No current facility-administered medications for this visit.   Facility-Administered Medications Ordered in Other Visits  Medication Dose Route Frequency Provider Last Rate Last Admin   0.9 %  sodium chloride  infusion   Intravenous Continuous Cornelius Wanda DEL, MD        REVIEW OF SYSTEMS:  Review of Systems  Constitutional: Negative.  Negative for appetite change, chills, diaphoresis, fatigue, fever and unexpected weight change.  HENT:  Negative.  Negative for hearing loss, lump/mass, mouth sores, nosebleeds, sore throat, tinnitus, trouble swallowing and voice change.   Eyes:  Positive for eye problems (vision changes).  Respiratory:  Positive for cough. Negative for chest tightness, hemoptysis, shortness of breath and wheezing.   Cardiovascular: Negative.  Negative for chest pain, leg swelling and palpitations.  Gastrointestinal:  Positive for diarrhea. Negative for abdominal  distention, abdominal pain, blood in stool, constipation, nausea, rectal pain and vomiting.       Bowel incontinence upon straining  Endocrine: Negative.   Genitourinary:  Positive for bladder incontinence (upon straining). Negative for difficulty urinating, dyspareunia, dysuria, frequency, hematuria, menstrual problem, nocturia, pelvic pain, vaginal bleeding and vaginal discharge.   Musculoskeletal: Negative.  Negative for arthralgias, back pain, flank pain, gait problem, myalgias, neck pain and neck stiffness.  Skin: Negative.  Negative for itching, rash and wound.  Neurological:  Positive for headaches and numbness (neuropathy of bilateral feet). Negative for dizziness, extremity weakness, gait problem, light-headedness, seizures and speech difficulty.  Hematological: Negative.  Negative for adenopathy. Does not bruise/bleed easily.  Psychiatric/Behavioral: Negative.  Negative for depression and sleep disturbance. The patient is not nervous/anxious.    VITALS:  There were no vitals taken for this visit.  Wt Readings from Last 3 Encounters:  01/06/24 212 lb (96.2 kg)  01/05/24 212 lb 9.6 oz (96.4 kg)  11/12/23 207 lb (93.9 kg)    There is no height or weight on file to calculate BMI.  Performance status (ECOG): 1 - Symptomatic but completely ambulatory  PHYSICAL EXAM:  Physical Exam Vitals and nursing note reviewed. Exam conducted with a chaperone present.  Constitutional:      General: She is not in acute distress.    Appearance: Normal appearance. She is normal weight. She is not ill-appearing, toxic-appearing or diaphoretic.  HENT:     Head: Normocephalic and atraumatic.     Right Ear: Tympanic membrane, ear canal and external ear normal. There is no impacted cerumen.     Left Ear: Tympanic membrane, ear canal and external ear normal. There is no impacted cerumen.     Nose: Nose normal. No congestion or rhinorrhea.     Mouth/Throat:     Mouth: Mucous membranes are moist.      Pharynx: Oropharynx is clear. No oropharyngeal exudate or posterior oropharyngeal erythema.  Eyes:     General: No scleral icterus.       Right eye: No discharge.        Left eye: No discharge.     Extraocular Movements: Extraocular movements intact.     Conjunctiva/sclera: Conjunctivae normal.     Pupils: Pupils are equal, round, and reactive to light.  Cardiovascular:     Rate and Rhythm: Normal rate and regular rhythm.     Pulses: Normal pulses.     Heart sounds: Normal heart sounds. No murmur heard.    No friction rub. No gallop.  Pulmonary:     Effort: Pulmonary effort is normal. No respiratory distress.     Breath sounds: Examination of the right-lower field reveals decreased breath sounds. Decreased breath sounds present.  Abdominal:     General: Bowel sounds are normal. There is no distension.     Palpations: Abdomen is soft. There is no hepatomegaly, splenomegaly or mass.     Tenderness: There is no abdominal tenderness. There is no right CVA tenderness, left CVA tenderness, guarding or rebound.     Hernia: No hernia is present.  Musculoskeletal:        General: Normal range of motion.     Left upper arm: Edema present.     Cervical back: Normal range of motion  and neck supple.     Right lower leg: No edema.     Left lower leg: No edema.     Comments: Left upper extremity with 1-2+ edema Right upper extremity with 1+ edema  Lymphadenopathy:     Cervical: No cervical adenopathy.     Right cervical: No superficial, deep or posterior cervical adenopathy.    Left cervical: No superficial, deep or posterior cervical adenopathy.     Upper Body:     Right upper body: No supraclavicular, axillary or pectoral adenopathy.     Left upper body: No supraclavicular, axillary or pectoral adenopathy.  Skin:    General: Skin is warm and dry.  Neurological:     General: No focal deficit present.     Mental Status: She is alert and oriented to person, place, and time. Mental status is  at baseline.  Psychiatric:        Mood and Affect: Mood normal.        Behavior: Behavior normal.        Thought Content: Thought content normal.        Judgment: Judgment normal.    LABS:      Latest Ref Rng & Units 01/06/2024    2:02 PM 08/14/2021    9:31 AM 02/01/2007    8:20 AM  CBC  WBC 4.0 - 10.5 K/uL 11.6  7.3    Hemoglobin 12.0 - 15.0 g/dL 89.6  87.8  86.7   Hematocrit 36.0 - 46.0 % 30.4  35.9  38.0   Platelets 150 - 400 K/uL 287  239        Latest Ref Rng & Units 01/06/2024    2:02 PM 08/14/2021    9:31 AM 02/10/2021   10:15 AM  CMP  Glucose 70 - 99 mg/dL 800  878  874   BUN 8 - 23 mg/dL 24  29  27    Creatinine 0.44 - 1.00 mg/dL 8.81  8.76  8.90   Sodium 135 - 145 mmol/L 124  141  143   Potassium 3.5 - 5.1 mmol/L 3.4  4.7  4.1   Chloride 98 - 111 mmol/L 82  103  102   CO2 22 - 32 mmol/L 31  27  28    Calcium  8.9 - 10.3 mg/dL 9.2  9.9  9.5   Total Protein 6.5 - 8.1 g/dL 6.6  6.9  6.7   Total Bilirubin 0.0 - 1.2 mg/dL 0.3  0.3  0.3   Alkaline Phos 38 - 126 U/L 86  87  76   AST 15 - 41 U/L 39  23  23   ALT 0 - 44 U/L 36  16  21      Lab Results  Component Value Date   CEA 11.87 (H) 01/06/2024   /  CEA ([facility])  Date Value Ref Range Status  01/06/2024 11.87 (H) 0.00 - 5.00 ng/mL Final    Comment:    (NOTE) This test was performed using [employer] Coulter's paramagnetic chemiluminescent immunoassay. Values obtained from different assay methods cannot be used interchangeably. Please note that up to 8% of patients who smoke may see values 5.1-10.0 ng/ml and 1% of patients who smoke may see CEA levels >10.0 ng/ml. Performed at [facility], [address] [place], [place] [address]    No results found for: PSA1 No results found for: CAN199 No results found for: CAN125  No results found for: TOTALPROTELP, ALBUMINELP, A1GS, A2GS, BETS, BETA2SER, GAMS, MSPIKE, SPEI Lab  Results  Component Value Date   TIBC 316  01/10/2024   FERRITIN 297 01/10/2024   IRONPCTSAT 27 01/10/2024   No results found for: LDH  STUDIES:   Exam: 12/29/2023 Pathology Impression: Lung, right upper lobe, needle core biopsy: Small Cell Carcinoma The tumor cells are positive for TTF-1 and neuroendocrine markers CD56, synaptophysin, and chromogranin. GATA-3 is negative, Pan keratin shows dot-like positive staining.

## 2024-01-12 NOTE — Patient Instructions (Signed)
Etoposide Injection What is this medication? ETOPOSIDE (e toe POE side) treats some types of cancer. It works by slowing down the growth of cancer cells. This medicine may be used for other purposes; ask your health care provider or pharmacist if you have questions. COMMON BRAND NAME(S): Etopophos, Toposar, VePesid What should I tell my care team before I take this medication? They need to know if you have any of these conditions: Infection Kidney disease Liver disease Low blood counts, such as low white cell, platelet, red cell counts An unusual or allergic reaction to etoposide, other medications, foods, dyes, or preservatives If you or your partner are pregnant or trying to get pregnant Breastfeeding How should I use this medication? This medication is injected into a vein. It is given by your care team in a hospital or clinic setting. Talk to your care team about the use of this medication in children. Special care may be needed. Overdosage: If you think you have taken too much of this medicine contact a poison control center or emergency room at once. NOTE: This medicine is only for you. Do not share this medicine with others. What if I miss a dose? Keep appointments for follow-up doses. It is important not to miss your dose. Call your care team if you are unable to keep an appointment. What may interact with this medication? Warfarin This list may not describe all possible interactions. Give your health care provider a list of all the medicines, herbs, non-prescription drugs, or dietary supplements you use. Also tell them if you smoke, drink alcohol, or use illegal drugs. Some items may interact with your medicine. What should I watch for while using this medication? Your condition will be monitored carefully while you are receiving this medication. This medication may make you feel generally unwell. This is not uncommon as chemotherapy can affect healthy cells as well as cancer  cells. Report any side effects. Continue your course of treatment even though you feel ill unless your care team tells you to stop. This medication can cause serious side effects. To reduce the risk, your care team may give you other medications to take before receiving this one. Be sure to follow the directions from your care team. This medication may increase your risk of getting an infection. Call your care team for advice if you get a fever, chills, sore throat, or other symptoms of a cold or flu. Do not treat yourself. Try to avoid being around people who are sick. This medication may increase your risk to bruise or bleed. Call your care team if you notice any unusual bleeding. Talk to your care team about your risk of cancer. You may be more at risk for certain types of cancers if you take this medication. Talk to your care team if you may be pregnant. Serious birth defects can occur if you take this medication during pregnancy and for 6 months after the last dose. You will need a negative pregnancy test before starting this medication. Contraception is recommended while taking this medication and for 6 months after the last dose. Your care team can help you find the option that works for you. If your partner can get pregnant, use a condom during sex while taking this medication and for 4 months after the last dose. Do not breastfeed while taking this medication. This medication may cause infertility. Talk to your care team if you are concerned about your fertility. What side effects may I notice from receiving this medication?  Side effects that you should report to your care team as soon as possible: Allergic reactions--skin rash, itching, hives, swelling of the face, lips, tongue, or throat Infection--fever, chills, cough, sore throat, wounds that don't heal, pain or trouble when passing urine, general feeling of discomfort or being unwell Low red blood cell level--unusual weakness or fatigue,  dizziness, headache, trouble breathing Unusual bruising or bleeding Side effects that usually do not require medical attention (report to your care team if they continue or are bothersome): Diarrhea Fatigue Hair loss Loss of appetite Nausea Vomiting This list may not describe all possible side effects. Call your doctor for medical advice about side effects. You may report side effects to FDA at 1-800-FDA-1088. Where should I keep my medication? This medication is given in a hospital or clinic. It will not be stored at home. NOTE: This sheet is a summary. It may not cover all possible information. If you have questions about this medicine, talk to your doctor, pharmacist, or health care provider.  2024 [employer]/Gold Standard (2021-07-17 00:00:00)

## 2024-01-13 ENCOUNTER — Other Ambulatory Visit: Payer: Self-pay

## 2024-01-13 ENCOUNTER — Telehealth: Payer: Self-pay

## 2024-01-13 ENCOUNTER — Ambulatory Visit
Admission: RE | Admit: 2024-01-13 | Discharge: 2024-01-13 | Disposition: A | Discharge status: Home / Self Care | Source: Ambulatory Visit | Attending: Radiation Oncology | Admitting: Radiation Oncology

## 2024-01-13 ENCOUNTER — Other Ambulatory Visit: Payer: Self-pay | Admitting: Hematology and Oncology

## 2024-01-13 DIAGNOSIS — C342 Malignant neoplasm of middle lobe, bronchus or lung: Secondary | ICD-10-CM | POA: Diagnosis not present

## 2024-01-13 DIAGNOSIS — C3411 Malignant neoplasm of upper lobe, right bronchus or lung: Secondary | ICD-10-CM | POA: Diagnosis not present

## 2024-01-13 DIAGNOSIS — Z5111 Encounter for antineoplastic chemotherapy: Secondary | ICD-10-CM | POA: Diagnosis not present

## 2024-01-13 DIAGNOSIS — E871 Hypo-osmolality and hyponatremia: Secondary | ICD-10-CM | POA: Diagnosis not present

## 2024-01-13 DIAGNOSIS — Z87891 Personal history of nicotine dependence: Secondary | ICD-10-CM | POA: Diagnosis not present

## 2024-01-13 DIAGNOSIS — Z510 Encounter for antineoplastic radiation therapy: Secondary | ICD-10-CM | POA: Diagnosis not present

## 2024-01-13 DIAGNOSIS — C787 Secondary malignant neoplasm of liver and intrahepatic bile duct: Secondary | ICD-10-CM | POA: Diagnosis not present

## 2024-01-13 LAB — RAD ONC ARIA SESSION SUMMARY
Course Elapsed Days: 0
Plan Fractions Treated to Date: 1
Plan Prescribed Dose Per Fraction: 3 Gy
Plan Total Fractions Prescribed: 2
Plan Total Prescribed Dose: 6 Gy
Reference Point Dosage Given to Date: 3 Gy
Reference Point Session Dosage Given: 3 Gy
Session Number: 1

## 2024-01-13 NOTE — Transitions of Care (Post Inpatient/ED Visit) (Signed)
 Transition of Care week 3  Visit Note  01/13/2024  Name: Heather Chavez MRN: [id number]          DOB: [birth year]  Situation: Patient enrolled in [facility] 30-day program. Visit completed with patient by telephone.   Background: Admit/Discharge Date  10/22 tx from [facility] - 10/24 [facility]   Primary Diagnosis: Mass of right   Initial Transition Care Management Follow-up Telephone Call Discharge Date and Diagnosis: 12/31/23, Mass of right lung   Past Medical History:  Diagnosis Date   Advanced nonexudative age-related macular degeneration of right eye with subfoveal involvement 09/21/2019   Advanced nonexudative age-related macular degeneration of right eye without subfoveal involvement 09/21/2019   Allergic rhinitis    Angina pectoris 05/17/2020   Bilateral breast cancer (HCC) 11/23/2013   BMI 34.0-34.9,adult 09/25/2019   Breast cancer (HCC)    Bronchitis, chronic (HCC)    CAD (coronary artery disease)    Cancer (HCC)    Cardiac murmur 05/17/2020   Coronary artery calcification 05/17/2020   Degenerative retinal drusen of right eye 09/21/2019   Diabetes mellitus due to underlying condition with unspecified complications (HCC) 05/17/2020   Diverticulosis    Dry eyes 06/25/2016   Dyspnea    Emphysema/COPD    Essential hypertension 05/17/2020   Estrogen receptor positive neoplasm 09/24/2015   Family history of malignant neoplasm of breast 11/23/2013   Family history of peritoneal cancer 11/23/2013   Fuchs' corneal dystrophy 11/03/2016   GERD (gastroesophageal reflux disease)    History of colon polyps    History of right breast cancer 09/19/2018   Hypertension    IBS (irritable bowel syndrome)    with D   Intermediate stage nonexudative age-related macular degeneration of left eye 09/21/2019   Iritis 11/03/2016   Macular degeneration    Major depressive disorder    Malignant neoplasm of upper-inner quadrant of left breast in female, estrogen receptor  positive (HCC) 09/01/2016   Mixed dyslipidemia 05/17/2020   Obesity    Obesity (BMI 35.0-39.9 without comorbidity) 05/17/2020   OSA on CPAP 10/29/2020   Diagnosed some 3 months prior after pneumonia episode, Dr. Shellia pulmonology   Peripheral vascular disease    Personal history of chemotherapy    Personal history of radiation therapy    Pseudophakia 09/21/2019   Right-sided chest wall pain    SVT (supraventricular tachycardia) 05/23/2020   Varicose veins of left lower extremity with complications 08/11/2016    Assessment: Patient Reported Symptoms: Cognitive Cognitive Status: No symptoms reported, Normal speech and language skills, Alert and oriented to person, place, and time      Neurological Neurological Review of Symptoms: No symptoms reported    HEENT HEENT Symptoms Reported: Not assessed      Cardiovascular Cardiovascular Symptoms Reported: Swelling in legs or feet Cardiovascular Comment: Patient reports she has swelling of her arms, face, and neck and states she has had to start radiation due to reportted Superior Vena Cava syndrome and states the hope is that radiation will shrink the tumor to enable better blood flow  Respiratory Respiratory Symptoms Reported: Not assesed    Endocrine Endocrine Symptoms Reported: Not assessed    Gastrointestinal Gastrointestinal Symptoms Reported: Not assessed      Genitourinary      Integumentary      Musculoskeletal Musculoskelatal Symptoms Reviewed: Not assessed        Psychosocial Psychosocial Symptoms Reported: Not assessed         There were no vitals filed for this visit.  Medications Reviewed Today   Medications were not reviewed in this encounter     Recommendation:   Continue Current Plan of Care  Follow Up Plan:   Telephone follow up appointment date/time:  01/20/24 in the afternoon  Shona Prow RN, CCM [place]  VBCI-Population Health RN Care Manager [phone]

## 2024-01-13 NOTE — Progress Notes (Signed)
 Radiation Oncology         [phone] ________________________________  Name: Heather Chavez        MRN: [id number]  Date of Service: 01/12/2024 DOB: [birth year]  RR:Ynou, Marcellus RAMAN, MD  [name], MD     REFERRING PHYSICIAN: Cornelius Reusing, MD   DIAGNOSIS: Small cell lung carcinoma   HISTORY OF PRESENT ILLNESS: Heather Chavez is a 78 y.o. female seen at the request of Dr. Cornelius.  She has a medical history significant for bilateral breast carcinoma.  She was recently diagnosed with small cell lung carcinoma.  Imaging studies revealed bulky mediastinal lymphadenopathy.  Earlier this week, she began exhibiting symptoms consistent with superior cava syndrome, including swelling of her neck and upper extremities.  She is seen today to discuss the role of potential radiation in her care.    PREVIOUS RADIATION THERAPY: Yes (breast)   PAST MEDICAL HISTORY:  Past Medical History:  Diagnosis Date   Advanced nonexudative age-related macular degeneration of right eye with subfoveal involvement 09/21/2019   Advanced nonexudative age-related macular degeneration of right eye without subfoveal involvement 09/21/2019   Allergic rhinitis    Angina pectoris 05/17/2020   Bilateral breast cancer (HCC) 11/23/2013   BMI 34.0-34.9,adult 09/25/2019   Breast cancer (HCC)    Bronchitis, chronic (HCC)    CAD (coronary artery disease)    Cancer (HCC)    Cardiac murmur 05/17/2020   Coronary artery calcification 05/17/2020   Degenerative retinal drusen of right eye 09/21/2019   Diabetes mellitus due to underlying condition with unspecified complications (HCC) 05/17/2020   Diverticulosis    Dry eyes 06/25/2016   Dyspnea    Emphysema/COPD    Essential hypertension 05/17/2020   Estrogen receptor positive neoplasm 09/24/2015   Family history of malignant neoplasm of breast 11/23/2013   Family history of peritoneal cancer 11/23/2013   Fuchs' corneal dystrophy 11/03/2016    GERD (gastroesophageal reflux disease)    History of colon polyps    History of right breast cancer 09/19/2018   Hypertension    IBS (irritable bowel syndrome)    with D   Intermediate stage nonexudative age-related macular degeneration of left eye 09/21/2019   Iritis 11/03/2016   Macular degeneration    Major depressive disorder    Malignant neoplasm of upper-inner quadrant of left breast in female, estrogen receptor positive (HCC) 09/01/2016   Mixed dyslipidemia 05/17/2020   Obesity    Obesity (BMI 35.0-39.9 without comorbidity) 05/17/2020   OSA on CPAP 10/29/2020   Diagnosed some 3 months prior after pneumonia episode, Dr. Shellia pulmonology   Peripheral vascular disease    Personal history of chemotherapy    Personal history of radiation therapy    Pseudophakia 09/21/2019   Right-sided chest wall pain    SVT (supraventricular tachycardia) 05/23/2020   Varicose veins of left lower extremity with complications 08/11/2016       PAST SURGICAL HISTORY: Past Surgical History:  Procedure Laterality Date   BREAST BIOPSY     BREAST LUMPECTOMY     right brwast 2005/ left 2015   CATARACT EXTRACTION     CHOLECYSTECTOMY     COLONOSCOPY  04/17/2015   Colonic polyps status post polypectomy. Moderate to severe sigmoid diverticulosis   ENDOVENOUS ABLATION SAPHENOUS VEIN W/ LASER Right 02/19/2023   endovenous laser ablation right greater saphenous vein and stab phlebectomy 10-20 incisions right leg by Gaile New MD   TUBAL LIGATION     VAGINAL HYSTERECTOMY     VEIN  LIGATION AND STRIPPING       FAMILY HISTORY:  Family History  Problem Relation Age of Onset   Cancer Mother 74       bilateral breast cancer ages 38 then 57. Peritoneal cancer at age 35.   Other Mother        Ms. Rantz's mother was adopted so have no information regarding maternal family  history other than her mother   Breast cancer Mother    Cancer Father 60       renal   Cancer Sister 63       breast   Breast  cancer Sister    Breast cancer Daughter    Breast cancer Maternal Aunt    Breast cancer Paternal Aunt    Breast cancer Paternal Grandmother    Cancer Paternal Grandmother        bilateral breast cancer at unknown age     SOCIAL HISTORY:  reports that she quit smoking about 42 years ago. Her smoking use included cigarettes. She started smoking about 57 years ago. She has a 37.5 pack-year smoking history. She has never used smokeless tobacco. She reports current alcohol use. She reports that she does not use drugs.   ALLERGIES: Codeine, Levofloxacin, Nexlizet  [bempedoic acid-ezetimibe ], Pitavastatin , and Rosuvastatin    MEDICATIONS:  Current Outpatient Medications  Medication Sig Dispense Refill   albuterol (VENTOLIN HFA) 108 (90 Base) MCG/ACT inhaler Inhale 2 puffs into the lungs every 4 (four) hours as needed for wheezing.     allopurinol (ZYLOPRIM) 300 MG tablet Take 1 tablet (300 mg total) by mouth daily. 30 tablet 0   Biotin 89999 MCG TABS Take 10,000 mcg by mouth daily.     Boswellia-Glucosamine-Vit D (OSTEO BI-FLEX ONE PER DAY) TABS Take by mouth.     budesonide -formoterol  (SYMBICORT ) 80-4.5 MCG/ACT inhaler Inhale 2 puffs into the lungs 2 (two) times daily.     dextromethorphan-guaiFENesin (ROBITUSSIN-DM) 10-100 MG/5ML liquid Take 5 mLs by mouth every 6 (six) hours as needed for cough.     fexofenadine (ALLEGRA) 180 MG tablet Take 180 mg by mouth.     fluconazole (DIFLUCAN) 150 MG tablet Take 1 tablet (150 mg total) by mouth daily. 3 tablet 2   FLUoxetine (PROZAC) 20 MG capsule Take 20 mg by mouth daily.     furosemide (LASIX) 40 MG tablet Take 40 mg by mouth.     gabapentin (NEURONTIN) 600 MG tablet Take 600 mg by mouth 2 (two) times daily.     GARLIC PO Take 1 tablet by mouth daily.     ipratropium-albuterol (DUONEB) 0.5-2.5 (3) MG/3ML SOLN Take 3 mLs by nebulization every 4 (four) hours as needed (breathing treatment).     LORazepam  (ATIVAN ) 0.5 MG tablet Take by mouth.      losartan-hydrochlorothiazide (HYZAAR) 50-12.5 MG tablet Take 1 tablet by mouth daily.     metFORMIN (GLUCOPHAGE) 500 MG tablet Take 500 mg by mouth daily.     metoprolol  tartrate (LOPRESSOR ) 25 MG tablet Take 25 mg by mouth 2 (two) times daily.     montelukast (SINGULAIR) 10 MG tablet Take 10 mg by mouth at bedtime.     Multiple Vitamins-Minerals (PRESERVISION AREDS 2 PO) Take by mouth.     nitroGLYCERIN  (NITROSTAT ) 0.4 MG SL tablet Place 0.4 mg under the tongue every 5 (five) minutes as needed for chest pain.     ondansetron  (ZOFRAN ) 4 MG tablet Take 1 tablet (4 mg total) by mouth every 4 (four) hours as needed for nausea.  90 tablet 3   ondansetron  (ZOFRAN -ODT) 4 MG disintegrating tablet Take 4 mg by mouth.     pantoprazole  (PROTONIX ) 40 MG tablet Take 1 tablet (40 mg total) by mouth daily. Call [phone] to schedule an office visit for more refills 90 tablet 0   potassium chloride SA (KLOR-CON M) 20 MEQ tablet Take 1 tablet (20 mEq total) by mouth daily. 30 tablet 5   prochlorperazine (COMPAZINE) 10 MG tablet Take 1 tablet (10 mg total) by mouth every 6 (six) hours as needed for nausea or vomiting. 90 tablet 3   vitamin E 180 MG (400 UNITS) capsule Take 400 Units by mouth daily.     zinc gluconate 50 MG tablet Take 50 mg by mouth daily.     No current facility-administered medications for this encounter.     REVIEW OF SYSTEMS: She reports no fevers or chills.  She is moderately fatigued.  She has noted gradual swelling in her neck and the bilateral upper extremities.  She reports no breathing difficulties.  She notes an occasional nonproductive cough.  She denies chest pain.    PHYSICAL EXAM:  Wt Readings from Last 3 Encounters:  01/06/24 212 lb (96.2 kg)  01/05/24 212 lb 9.6 oz (96.4 kg)  11/12/23 207 lb (93.9 kg)   Temp Readings from Last 3 Encounters:  01/12/24 97.7 F (36.5 C)  01/11/24 98 F (36.7 C)  01/10/24 98 F (36.7 C)   BP Readings from Last 3 Encounters:   01/12/24 127/71  01/11/24 (!) 130/59  01/10/24 (!) 156/69   Pulse Readings from Last 3 Encounters:  01/12/24 74  01/11/24 79  01/10/24 99    /10  She is seen today comfortably seated in the infusion chair.  Heart demonstrates a regular rate and rhythm.  Examination of her neck reveals mild to moderate edema.  No lymphadenopathy is appreciated.  Examination of her right and left upper extremities reveals moderate lymphedema bilaterally.  No lower extremity edema is appreciated.   ECOG = 2  0 - Asymptomatic (Fully active, able to carry on all predisease activities without restriction)  1 - Symptomatic but completely ambulatory (Restricted in physically strenuous activity but ambulatory and able to carry out work of a light or sedentary nature. For example, light housework, office work)  2 - Symptomatic, <50% in bed during the day (Ambulatory and capable of all self care but unable to carry out any work activities. Up and about more than 50% of waking hours)  3 - Symptomatic, >50% in bed, but not bedbound (Capable of only limited self-care, confined to bed or chair 50% or more of waking hours)  4 - Bedbound (Completely disabled. Cannot carry on any self-care. Totally confined to bed or chair)  5 - Death   Raylene MM, Creech RH, Tormey DC, et al. [phone]). Toxicity and response criteria of the [facility]. Am. DOROTHA Bridges. Oncol. 5 (6): 649-55    LABORATORY DATA:  Lab Results  Component Value Date   WBC 11.6 (H) 01/06/2024   HGB 10.3 (L) 01/06/2024   HCT 30.4 (L) 01/06/2024   MCV 88.9 01/06/2024   PLT 287 01/06/2024   Lab Results  Component Value Date   NA 124 (L) 01/06/2024   K 3.4 (L) 01/06/2024   CL 82 (L) 01/06/2024   CO2 31 01/06/2024   Lab Results  Component Value Date   ALT 36 01/06/2024   AST 39 01/06/2024   ALKPHOS 86 01/06/2024   BILITOT 0.3  01/06/2024         IMPRESSION/PLAN: 1.  The patient is a 78 year old female recently diagnosed  with small cell lung carcinoma, currently with bulky mediastinal lymphadenopathy causing compression of her superior vena cava.  She has noted recent worsening of neck and upper extremity swelling.  I have recommended to her that we initiate palliative radiation to her mediastinum urgently; I explained the rationale behind this recommendation, as well as the potential side effects of radiation in her clinical scenario.  I explained that these might include, but are not limited to, fatigue, skin irritation, and esophageal irritation.  She expressed understanding, and is agreeable to proceed.  We will make arrangements to proceed with simulation and treatment planning   In a visit lasting 30 minutes, greater than 50% of the time was spent face to face discussing the patient's condition, in preparation for the discussion, and coordinating the patient's care.    Merrit Waugh A. [name], MD   **Disclaimer: This note was dictated with voice recognition software. Similar sounding words can inadvertently be transcribed and this note may contain transcription errors which may not have been corrected upon publication of note.**

## 2024-01-13 NOTE — Patient Instructions (Signed)
 Visit Information  Thank you for taking time to visit with me today. Please don't hesitate to contact me if I can be of assistance to you before our next scheduled telephone appointment.  Our next appointment is by telephone on 01/20/24 in the afternoon  Following is a copy of your care plan:   Goals Addressed             This Visit's Progress    VBCI Transitions of Care (TOC) Care Plan       Problems:  Recent Hospitalization for treatment of Mass of right lung   Patient reports feeling concern that cancer has spread and is awaiting PET scan, MRI, and appointment with Dr Wanda Cornish 01/05/24 for answers - patient completed and reports Stage IV with first chemo treatment today 01/10/24  Goal:  Over the next 30 days, the patient will not experience hospital readmission  Interventions:  Transitions of Care: Doctor Visits  - discussed the importance of doctor visits - Patient is scheduled to see PCP, Dr Ina today - plans to discuss the fluid in her arms  Discussed diabetes history and patient states she doesn't check her sugar - not a priority at this time - A1C 7.2 12/29/23 During medication review-TOC RN educated on importance of discussing nitroglycerin  with provider as she feels hers has expired Discussed new diagnosis with patient who states she is nervous about her appt with oncology tomorrow but is wanting answers - states she feels the cancer has likely already spread elsewhere and states she has had breast cancer x 2  Update 01/10/24: Patient was at her first chemo treatment when [facility] RN called. Patient states Dr Cornish told her she has stage  IV lung cancer that is not curable but is treatable and patient reports her chemo this week is today 01/10/24, Tu, Wed, and will return for labs and injection Friday 01/14/24. Patient states she wasn't surprised by the news and is just moving forward with treatment. Patient then said she had to end the call as the nurse needs to draw her  blood - TOC RN asked permission to follow up again with patient and patient was agreeable. TOC RN will try to reach her Thursday 01/13/24 - TOC RN was unable to review medications or assessments.  Update 01/13/24: Spoke with patient who was driving. Patient voicing feelings about diagnosis, chemo,swelling, and now having to have radiation. 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 Patient states the swelling is unbelievable and states every day it gets worse. Patient states she just is having troubling grasping and communicating to others exactly what's going on - states the minister was at her home today and feels she said more to him than anyone - states she talked to a granddaughter (who is a engineer, civil (consulting) and is her HCPOA) this morning. Patient states she told granddaughter she is willing to do whatever it takes and would like "to make it to Christmas". Patient states she has not given all details to her friends or her sister who is in [place]  and will return before Thanksgiving. Patient states she doesn't want her sister to cancel her time away. TOC RN expressed to patient that when we talk, we can talk about whatever she wants to discuss and it's okay for her to say anything she is feeling. TOC RN  discussed with patient that none of us  know our last day and hers could be due to the cancer or something else. Encouraged patient to think about her wishes and when she is ready to communicate her wishes and discuss Advanced Care Planning - Patient does have the HCPOA on record - TOC RN will discuss at a more appropriate time if patient would like to talk about DNR status vs Full Code. Patient states her children know she wants to be cremated. Patient then stated she is at the pickup line and is picking up her grandson and is no longer able to talk - [facility] RN will  continue to meet patient where she is and allow her to talk about what is important to her. Medications were not reviewed/patient was driving and focus was on allowing patient to voice her concerns. Not all assessments were completed. Will follow up with patient.    COPD Interventions: Advised patient to track and manage COPD triggers Assessed social determinant of health barriers Discussed the importance of adequate rest and management of fatigue with COPD Provided instruction about proper use of medications used for management of COPD including inhalers Use of home oxygen  new on 3 liters - Education provided on oxygen  safety including no smoking in the home - oxygen  in use sign on door - and notifying power company - patient asking about POC and was directed to discuss with provider as this requires an order   Oncology: Assessment of understanding of oncology diagnosis:  Reviewed upcoming provider appointments and treatment appointments Assessed available transportation to appointments and treatments. Has consistent/reliable transportation: Yes Assessed support system. Has consistent/reliable family or other support: Yes  Patient Self Care Activities:  Attend all scheduled provider appointments Call pharmacy for medication refills 3-7 days in advance of running out of medications Call provider office for new concerns or questions  Notify RN Care Manager of TOC call rescheduling needs Participate in Transition of Care Program/Attend TOC scheduled calls Take medications as prescribed   identify and remove indoor air pollutants limit outdoor activity during cold weather keep follow-up appointments: Pulmonary appointment scheduled with Dr Darlean and PFT 02/28/2024  Plan:  Telephone follow up appointment with care management team member scheduled for:  11/13/25pm The patient has been provided with contact information for the care management team and has been advised to call with any health  related questions or concerns.         Patient verbalizes understanding of instructions and care plan provided today and agrees to view in MyChart. Active MyChart status and patient understanding of how to access instructions and care plan via MyChart confirmed with patient.     Telephone follow up appointment with care management team member scheduled for: 01/20/24 The patient has been provided with contact information for the care management team and has been advised to call with any health related questions or concerns.   Please call the care guide team at [phone] if you need to cancel or reschedule your appointment.   Please call the Suicide and Crisis Lifeline: 988 call 1-800-273-TALK (toll free, 24 hour hotline) call 911 if you are experiencing a Mental Health or Behavioral Health Crisis or need someone to talk to.  Shona Prow RN, CCM [place]  VBCI-Population Health RN Care Manager [phone]

## 2024-01-14 ENCOUNTER — Encounter: Payer: Self-pay | Admitting: Hematology and Oncology

## 2024-01-14 ENCOUNTER — Encounter: Payer: Self-pay | Admitting: Oncology

## 2024-01-14 ENCOUNTER — Ambulatory Visit
Admission: RE | Admit: 2024-01-14 | Discharge: 2024-01-14 | Disposition: A | Discharge status: Home / Self Care | Source: Ambulatory Visit | Attending: Radiation Oncology | Admitting: Radiation Oncology

## 2024-01-14 ENCOUNTER — Inpatient Hospital Stay

## 2024-01-14 ENCOUNTER — Other Ambulatory Visit: Payer: Self-pay

## 2024-01-14 ENCOUNTER — Other Ambulatory Visit: Payer: Self-pay | Admitting: Pharmacist

## 2024-01-14 ENCOUNTER — Telehealth: Payer: Self-pay

## 2024-01-14 VITALS — BP 132/69 | HR 80 | Temp 98.3°F | Resp 18

## 2024-01-14 DIAGNOSIS — C787 Secondary malignant neoplasm of liver and intrahepatic bile duct: Secondary | ICD-10-CM | POA: Diagnosis not present

## 2024-01-14 DIAGNOSIS — C3411 Malignant neoplasm of upper lobe, right bronchus or lung: Secondary | ICD-10-CM | POA: Diagnosis not present

## 2024-01-14 DIAGNOSIS — Z87891 Personal history of nicotine dependence: Secondary | ICD-10-CM | POA: Diagnosis not present

## 2024-01-14 DIAGNOSIS — C3431 Malignant neoplasm of lower lobe, right bronchus or lung: Secondary | ICD-10-CM

## 2024-01-14 DIAGNOSIS — E871 Hypo-osmolality and hyponatremia: Secondary | ICD-10-CM | POA: Diagnosis not present

## 2024-01-14 DIAGNOSIS — C3430 Malignant neoplasm of lower lobe, unspecified bronchus or lung: Secondary | ICD-10-CM

## 2024-01-14 DIAGNOSIS — C342 Malignant neoplasm of middle lobe, bronchus or lung: Secondary | ICD-10-CM | POA: Diagnosis not present

## 2024-01-14 DIAGNOSIS — Z5111 Encounter for antineoplastic chemotherapy: Secondary | ICD-10-CM | POA: Diagnosis not present

## 2024-01-14 DIAGNOSIS — Z510 Encounter for antineoplastic radiation therapy: Secondary | ICD-10-CM | POA: Diagnosis not present

## 2024-01-14 LAB — CBC WITH DIFFERENTIAL (CANCER CENTER ONLY)
Abs Immature Granulocytes: 0.03 K/uL (ref 0.00–0.07)
Basophils Absolute: 0 K/uL (ref 0.0–0.1)
Basophils Relative: 0 %
Eosinophils Absolute: 0 K/uL (ref 0.0–0.5)
Eosinophils Relative: 0 %
HCT: 27.9 % — ABNORMAL LOW (ref 36.0–46.0)
Hemoglobin: 9.2 g/dL — ABNORMAL LOW (ref 12.0–15.0)
Immature Granulocytes: 0 %
Lymphocytes Relative: 17 %
Lymphs Abs: 1.2 K/uL (ref 0.7–4.0)
MCH: 29.4 pg (ref 26.0–34.0)
MCHC: 33 g/dL (ref 30.0–36.0)
MCV: 89.1 fL (ref 80.0–100.0)
Monocytes Absolute: 0 K/uL — ABNORMAL LOW (ref 0.1–1.0)
Monocytes Relative: 0 %
Neutro Abs: 5.8 K/uL (ref 1.7–7.7)
Neutrophils Relative %: 83 %
Platelet Count: 208 K/uL (ref 150–400)
RBC: 3.13 MIL/uL — ABNORMAL LOW (ref 3.87–5.11)
RDW: 15 % (ref 11.5–15.5)
WBC Count: 7.1 K/uL (ref 4.0–10.5)
nRBC: 0 % (ref 0.0–0.2)

## 2024-01-14 LAB — RAD ONC ARIA SESSION SUMMARY
Course Elapsed Days: 1
Plan Fractions Treated to Date: 2
Plan Prescribed Dose Per Fraction: 3 Gy
Plan Total Fractions Prescribed: 2
Plan Total Prescribed Dose: 6 Gy
Reference Point Dosage Given to Date: 6 Gy
Reference Point Session Dosage Given: 3 Gy
Session Number: 2

## 2024-01-14 LAB — CMP (CANCER CENTER ONLY)
ALT: 45 U/L — ABNORMAL HIGH (ref 0–44)
AST: 60 U/L — ABNORMAL HIGH (ref 15–41)
Albumin: 3.7 g/dL (ref 3.5–5.0)
Alkaline Phosphatase: 75 U/L (ref 38–126)
Anion gap: 11 (ref 5–15)
BUN: 21 mg/dL (ref 8–23)
CO2: 29 mmol/L (ref 22–32)
Calcium: 9 mg/dL (ref 8.9–10.3)
Chloride: 88 mmol/L — ABNORMAL LOW (ref 98–111)
Creatinine: 0.81 mg/dL (ref 0.44–1.00)
GFR, Estimated: >60 mL/min (ref >60)
Glucose, Bld: 104 mg/dL — ABNORMAL HIGH (ref 70–99)
Potassium: 3.9 mmol/L (ref 3.5–5.1)
Sodium: 129 mmol/L — ABNORMAL LOW (ref 135–145)
Total Bilirubin: 0.6 mg/dL (ref 0.0–1.2)
Total Protein: 6.1 g/dL — ABNORMAL LOW (ref 6.5–8.1)

## 2024-01-14 MED ORDER — ALTEPLASE 2 MG IJ SOLR
2.0000 mg | Freq: Once | INTRAMUSCULAR | Status: AC | PRN
  Administered 2024-01-14: 2 mg
  Filled 2024-01-14: qty 2

## 2024-01-14 MED ORDER — PEGFILGRASTIM-FPGK 6 MG/0.6ML ~~LOC~~ SOSY: 6.0000 mg | PREFILLED_SYRINGE | Freq: Once | SUBCUTANEOUS | Status: DC

## 2024-01-14 NOTE — Telephone Encounter (Signed)
 Pt here today for radiation treatment, labs and injection. She drives herself here. She is able to walk into the cancer center @ her own pace w/O2 tank in roller.  Staff placed her in wheelchair to conserve her energy since she is going to different areas within the center. She does get SOB on exertion, and has been on oxygen  since first diagnosis.

## 2024-01-14 NOTE — Progress Notes (Signed)
 Hold pegfilgrastim with cycle 1 due to concurrent radiation per Dr. Cornelius.

## 2024-01-14 NOTE — Progress Notes (Signed)
 [facility] CARE CLINIC CONSULT NOTE [facility]  [phone] [phone]  Patient Care Team: [name], MD as PCP - General (Family Medicine) Lauro Shona LABOR, RN as Registered Nurse Cornelius Wanda DEL, MD as Consulting Physician (Oncology)   Name of the patient: Heather Chavez  [id number]  06/06/1945   Date of visit: 01/14/24  Diagnosis- Lung Cancer  Chief complaint/Reason for visit- Initial Meeting for [facility], preparing for starting chemotherapy   Heme/Onc history:  Oncology History  Lung cancer, lower lobe (HCC)  01/05/2024 Initial Diagnosis   Lung cancer, lower lobe (HCC)   01/06/2024 Cancer Staging   Staging form: Lung, AJCC V9 - Clinical stage from 01/06/2024: Stage IVB (cT4, cN2b, cM1c1) - Signed by Cornelius Wanda DEL, MD on 01/06/2024 Histopathologic type: Small cell carcinoma, NOS Stage prefix: Initial diagnosis Method of lymph node assessment: Clinical IASLC grade: G3 Histologic grading system: 3 grade system Laterality: Right Tumor size (mm): 56 Sites of metastasis: Liver Lymph-vascular invasion (LVI): Presence of LVI unknown/indeterminate Diagnostic confirmation: Positive histology Specimen type: Bronchial Biopsy Staged by: Managing physician Standard uptake value (SUV): 12.5 Perineural invasion (PNI): Unknown Pleural/elastic layer invasion: Unknown Stage used in treatment planning: Yes National guidelines used in treatment planning: Yes Type of national guideline used in treatment planning: NCCN   01/10/2024 -  Chemotherapy   Patient is on Treatment Plan : LUNG SCLC Carboplatin + Etoposide + Atezolizumab Induction q21d x 4 cycles / Atezolizumab Maintenance q21d       Interval history-  Patient presents to chemo care clinic today for initial meeting in preparation for starting chemotherapy. I introduced the chemo care clinic and we discussed that the role of the clinic is to assist those who are  at an increased risk of emergency room visits and/or complications during the course of chemotherapy treatment. We discussed that the increased risk takes into account factors such as age, performance status, and co-morbidities. We also discussed that for some, this might include barriers to care such as not having a primary care provider, lack of insurance/transportation, or not being able to afford medications. We discussed that the goal of the program is to help prevent unplanned ER visits and help reduce complications during chemotherapy. We do this by discussing specific risk factors to each individual and identifying ways that we can help improve these risk factors and reduce barriers to care.   Allergies  Allergen Reactions   Codeine Nausea And Vomiting and Other (See Comments)    Other reaction(s): Other (see comments), Vomiting   Levofloxacin Other (See Comments)    Leg cramps   Nexlizet  [Bempedoic Acid-Ezetimibe ] Other (See Comments)   Pitavastatin  Other (See Comments)    Leg cramps  Other Reaction(s): Other (See Comments)  Leg cramps    Leg cramps   Rosuvastatin  Other (See Comments)    Muscle pain    Past Medical History:  Diagnosis Date   Advanced nonexudative age-related macular degeneration of right eye with subfoveal involvement 09/21/2019   Advanced nonexudative age-related macular degeneration of right eye without subfoveal involvement 09/21/2019   Allergic rhinitis    Angina pectoris 05/17/2020   Bilateral breast cancer (HCC) 11/23/2013   BMI 34.0-34.9,adult 09/25/2019   Breast cancer (HCC)    Bronchitis, chronic (HCC)    CAD (coronary artery disease)    Cancer (HCC)    Cardiac murmur 05/17/2020   Coronary artery calcification 05/17/2020   Degenerative retinal drusen of right eye 09/21/2019   Diabetes mellitus due  to underlying condition with unspecified complications (HCC) 05/17/2020   Diverticulosis    Dry eyes 06/25/2016   Dyspnea    Emphysema/COPD     Essential hypertension 05/17/2020   Estrogen receptor positive neoplasm 09/24/2015   Family history of malignant neoplasm of breast 11/23/2013   Family history of peritoneal cancer 11/23/2013   Fuchs' corneal dystrophy 11/03/2016   GERD (gastroesophageal reflux disease)    History of colon polyps    History of right breast cancer 09/19/2018   Hypertension    IBS (irritable bowel syndrome)    with D   Intermediate stage nonexudative age-related macular degeneration of left eye 09/21/2019   Iritis 11/03/2016   Macular degeneration    Major depressive disorder    Malignant neoplasm of upper-inner quadrant of left breast in female, estrogen receptor positive (HCC) 09/01/2016   Mixed dyslipidemia 05/17/2020   Obesity    Obesity (BMI 35.0-39.9 without comorbidity) 05/17/2020   OSA on CPAP 10/29/2020   Diagnosed some 3 months prior after pneumonia episode, Dr. Shellia pulmonology   Peripheral vascular disease    Personal history of chemotherapy    Personal history of radiation therapy    Pseudophakia 09/21/2019   Right-sided chest wall pain    SVT (supraventricular tachycardia) 05/23/2020   Varicose veins of left lower extremity with complications 08/11/2016    Past Surgical History:  Procedure Laterality Date   BREAST BIOPSY     BREAST LUMPECTOMY     right brwast 2005/ left 2015   CATARACT EXTRACTION     CHOLECYSTECTOMY     COLONOSCOPY  04/17/2015   Colonic polyps status post polypectomy. Moderate to severe sigmoid diverticulosis   ENDOVENOUS ABLATION SAPHENOUS VEIN W/ LASER Right 02/19/2023   endovenous laser ablation right greater saphenous vein and stab phlebectomy 10-20 incisions right leg by Gaile New MD   TUBAL LIGATION     VAGINAL HYSTERECTOMY     VEIN LIGATION AND STRIPPING      Social History   Socioeconomic History   Marital status: Widowed    Spouse name: Not on file   Number of children: Not on file   Years of education: Not on file   Highest education  level: Not on file  Occupational History   Not on file  Tobacco Use   Smoking status: Former    Current packs/day: 0.00    Average packs/day: 2.5 packs/day for 15.0 years (37.5 ttl pk-yrs)    Types: Cigarettes    Start date: 03/09/1966    Quit date: 03/09/1981    Years since quitting: 42.8   Smokeless tobacco: Never  Vaping Use   Vaping status: Never Used  Substance and Sexual Activity   Alcohol use: Yes   Drug use: No   Sexual activity: Not on file  Other Topics Concern   Not on file  Social History Narrative   Not on file   Social Drivers of Health   Financial Resource Strain: Not on file  Food Insecurity: Low Risk  (01/06/2024)   Received from [facility]   Hunger Vital Sign    Within the past 12 months, you worried that your food would run out before you got money to buy more: Never true    Within the past 12 months, the food you bought just didn't last and you didn't have money to get more. : Never true  Transportation Needs: No Transportation Needs (01/06/2024)   Received from [facility]    In the past  12 months, has lack of reliable transportation kept you from medical appointments, meetings, work or from getting things needed for daily living? : No  Physical Activity: Not on file  Stress: No Stress Concern Present (12/29/2023)   Received from [facility] - Occupational Stress Questionnaire    Do you feel stress - tense, restless, nervous, or anxious, or unable to sleep at night because your mind is troubled all the time - these days?: Not at all  Social Connections: Not on file  Intimate Partner Violence: Not At Risk (01/04/2024)   Humiliation, Afraid, Rape, and Kick questionnaire    Fear of Current or Ex-Partner: No    Emotionally Abused: No    Physically Abused: No    Sexually Abused: No    Family History  Problem Relation Age of Onset   Cancer Mother 44       bilateral breast cancer ages 35  then 38. Peritoneal cancer at age 82.   Other Mother        Ms. Thayne's mother was adopted so have no information regarding maternal family  history other than her mother   Breast cancer Mother    Cancer Father 70       renal   Cancer Sister 29       breast   Breast cancer Sister    Breast cancer Daughter    Breast cancer Maternal Aunt    Breast cancer Paternal Aunt    Breast cancer Paternal Grandmother    Cancer Paternal Grandmother        bilateral breast cancer at unknown age     Current Outpatient Medications:    albuterol (VENTOLIN HFA) 108 (90 Base) MCG/ACT inhaler, Inhale 2 puffs into the lungs every 4 (four) hours as needed for wheezing., Disp: , Rfl:    Biotin 89999 MCG TABS, Take 10,000 mcg by mouth daily., Disp: , Rfl:    Boswellia-Glucosamine-Vit D (OSTEO BI-FLEX ONE PER DAY) TABS, Take by mouth., Disp: , Rfl:    budesonide -formoterol  (SYMBICORT ) 80-4.5 MCG/ACT inhaler, Inhale 2 puffs into the lungs 2 (two) times daily., Disp: , Rfl:    dextromethorphan-guaiFENesin (ROBITUSSIN-DM) 10-100 MG/5ML liquid, Take 5 mLs by mouth every 6 (six) hours as needed for cough., Disp: , Rfl:    fexofenadine (ALLEGRA) 180 MG tablet, Take 180 mg by mouth., Disp: , Rfl:    FLUoxetine (PROZAC) 20 MG capsule, Take 20 mg by mouth daily., Disp: , Rfl:    furosemide (LASIX) 40 MG tablet, Take 40 mg by mouth., Disp: , Rfl:    gabapentin (NEURONTIN) 600 MG tablet, Take 600 mg by mouth 2 (two) times daily., Disp: , Rfl:    GARLIC PO, Take 1 tablet by mouth daily., Disp: , Rfl:    ipratropium-albuterol (DUONEB) 0.5-2.5 (3) MG/3ML SOLN, Take 3 mLs by nebulization every 4 (four) hours as needed (breathing treatment)., Disp: , Rfl:    LORazepam  (ATIVAN ) 0.5 MG tablet, Take by mouth., Disp: , Rfl:    losartan-hydrochlorothiazide (HYZAAR) 50-12.5 MG tablet, Take 1 tablet by mouth daily., Disp: , Rfl:    metFORMIN (GLUCOPHAGE) 500 MG tablet, Take 500 mg by mouth daily., Disp: , Rfl:    metoprolol   tartrate (LOPRESSOR ) 25 MG tablet, Take 25 mg by mouth 2 (two) times daily., Disp: , Rfl:    montelukast (SINGULAIR) 10 MG tablet, Take 10 mg by mouth at bedtime., Disp: , Rfl:    Multiple Vitamins-Minerals (PRESERVISION AREDS 2 PO), Take  by mouth., Disp: , Rfl:    nitroGLYCERIN  (NITROSTAT ) 0.4 MG SL tablet, Place 0.4 mg under the tongue every 5 (five) minutes as needed for chest pain., Disp: , Rfl:    ondansetron  (ZOFRAN -ODT) 4 MG disintegrating tablet, Take 4 mg by mouth., Disp: , Rfl:    pantoprazole  (PROTONIX ) 40 MG tablet, Take 1 tablet (40 mg total) by mouth daily. Call [phone] to schedule an office visit for more refills, Disp: 90 tablet, Rfl: 0   vitamin E 180 MG (400 UNITS) capsule, Take 400 Units by mouth daily., Disp: , Rfl:    zinc gluconate 50 MG tablet, Take 50 mg by mouth daily., Disp: , Rfl:    allopurinol (ZYLOPRIM) 300 MG tablet, Take 1 tablet (300 mg total) by mouth daily., Disp: 30 tablet, Rfl: 0   fluconazole (DIFLUCAN) 150 MG tablet, Take 1 tablet (150 mg total) by mouth daily., Disp: 3 tablet, Rfl: 2   ondansetron  (ZOFRAN ) 4 MG tablet, Take 1 tablet (4 mg total) by mouth every 4 (four) hours as needed for nausea., Disp: 90 tablet, Rfl: 3   potassium chloride SA (KLOR-CON M) 20 MEQ tablet, Take 1 tablet (20 mEq total) by mouth daily., Disp: 30 tablet, Rfl: 5   prochlorperazine (COMPAZINE) 10 MG tablet, Take 1 tablet (10 mg total) by mouth every 6 (six) hours as needed for nausea or vomiting., Disp: 90 tablet, Rfl: 3     Latest Ref Rng & Units 01/06/2024    2:02 PM  CMP  Glucose 70 - 99 mg/dL 800   BUN 8 - 23 mg/dL 24   Creatinine 9.55 - 1.00 mg/dL 8.81   Sodium 864 - 854 mmol/L 124   Potassium 3.5 - 5.1 mmol/L 3.4   Chloride 98 - 111 mmol/L 82   CO2 22 - 32 mmol/L 31   Calcium  8.9 - 10.3 mg/dL 9.2   Total Protein 6.5 - 8.1 g/dL 6.6   Total Bilirubin 0.0 - 1.2 mg/dL 0.3   Alkaline Phos 38 - 126 U/L 86   AST 15 - 41 U/L 39   ALT 0 - 44 U/L 36       Latest Ref  Rng & Units 01/06/2024    2:02 PM  CBC  WBC 4.0 - 10.5 K/uL 11.6   Hemoglobin 12.0 - 15.0 g/dL 89.6   Hematocrit 63.9 - 46.0 % 30.4   Platelets 150 - 400 K/uL 287       No results found.   Assessment and plan- Patient is a 78 y.o. female who presents to Chemo Care Clinic for initial meeting in preparation for starting chemotherapy for the treatment of Lung cancer.   Chemo Care Clinic/High Risk for ER/Hospitalization during chemotherapy- We discussed the role of the chemo care clinic and identified patient specific risk factors. I discussed that patient was identified as high risk primarily based on:  Patient has past medical history positive for: Past Medical History:  Diagnosis Date   Advanced nonexudative age-related macular degeneration of right eye with subfoveal involvement 09/21/2019   Advanced nonexudative age-related macular degeneration of right eye without subfoveal involvement 09/21/2019   Allergic rhinitis    Angina pectoris 05/17/2020   Bilateral breast cancer (HCC) 11/23/2013   BMI 34.0-34.9,adult 09/25/2019   Breast cancer (HCC)    Bronchitis, chronic (HCC)    CAD (coronary artery disease)    Cancer (HCC)    Cardiac murmur 05/17/2020   Coronary artery calcification 05/17/2020   Degenerative retinal drusen of right eye 09/21/2019  Diabetes mellitus due to underlying condition with unspecified complications (HCC) 05/17/2020   Diverticulosis    Dry eyes 06/25/2016   Dyspnea    Emphysema/COPD    Essential hypertension 05/17/2020   Estrogen receptor positive neoplasm 09/24/2015   Family history of malignant neoplasm of breast 11/23/2013   Family history of peritoneal cancer 11/23/2013   Fuchs' corneal dystrophy 11/03/2016   GERD (gastroesophageal reflux disease)    History of colon polyps    History of right breast cancer 09/19/2018   Hypertension    IBS (irritable bowel syndrome)    with D   Intermediate stage nonexudative age-related macular degeneration  of left eye 09/21/2019   Iritis 11/03/2016   Macular degeneration    Major depressive disorder    Malignant neoplasm of upper-inner quadrant of left breast in female, estrogen receptor positive (HCC) 09/01/2016   Mixed dyslipidemia 05/17/2020   Obesity    Obesity (BMI 35.0-39.9 without comorbidity) 05/17/2020   OSA on CPAP 10/29/2020   Diagnosed some 3 months prior after pneumonia episode, Dr. Shellia pulmonology   Peripheral vascular disease    Personal history of chemotherapy    Personal history of radiation therapy    Pseudophakia 09/21/2019   Right-sided chest wall pain    SVT (supraventricular tachycardia) 05/23/2020   Varicose veins of left lower extremity with complications 08/11/2016    Patient has past surgical history positive for: Past Surgical History:  Procedure Laterality Date   BREAST BIOPSY     BREAST LUMPECTOMY     right brwast 2005/ left 2015   CATARACT EXTRACTION     CHOLECYSTECTOMY     COLONOSCOPY  04/17/2015   Colonic polyps status post polypectomy. Moderate to severe sigmoid diverticulosis   ENDOVENOUS ABLATION SAPHENOUS VEIN W/ LASER Right 02/19/2023   endovenous laser ablation right greater saphenous vein and stab phlebectomy 10-20 incisions right leg by Gaile New MD   TUBAL LIGATION     VAGINAL HYSTERECTOMY     VEIN LIGATION AND STRIPPING       We discussed that social determinants of health may have significant impacts on health and outcomes for cancer patients.  Today we discussed specific social determinants of performance status, alcohol use, depression, financial needs, food insecurity, housing, interpersonal violence, social connections, stress, tobacco use, and transportation.    After lengthy discussion the following were identified as areas of need:   Outpatient services: We discussed options including home based and outpatient services, DME and care program. We discusssed that patients who participate in regular physical activity report  fewer negative impacts of cancer and treatments and report less fatigue.   Financial Concerns: We discussed that living with cancer can create tremendous financial burden.  We discussed options for assistance. I asked that if assistance is needed in affording medications or paying bills to please let us  know so that we can provide assistance. We discussed options for food including social services,  and onsite food pantry.  We will also notify Lizbeth Sprague to see if cancer center can provide additional support.  Referral to Social work: Discussed social worker Lizbeth Sprague and the services she can provide such as support with utility bill, cell phone and gas vouchers.   Support groups: We discussed options for support groups at the cancer center. If interested, please notify nurse navigator to enroll. We discussed options for managing stress including healthy eating, exercise as well as participating in no charge counseling services at the cancer center and support groups.  If these  are of interest, patient can notify either myself or primary nursing team.We discussed options for management including medications and referral to quit Smart program  Transportation: We discussed options for transportation including [employer], paratransit, bus routes, link transit, taxi/uber/lyft, and cancer center van.  I have notified primary oncology team who will help assist with arranging fleeta transportation for appointments when/if needed. We also discussed options for transportation on short notice/acute visits.  Palliative care services: We have palliative care services available in the cancer center to discuss goals of care and advanced care planning.  Please let us  know if you have any questions or would like to speak to our palliative nurse practitioner.  Symptom Management Clinic: We discussed our symptom management clinic which is available for acute concerns while receiving treatment such as nausea, vomiting or  diarrhea.  We can be reached via telephone at [phone] or through my chart.  We are available for virtual or in person visits on the same day from 830 to 4 PM Monday through Friday. She denies needing specific assistance at this time and She will be followed by Dr. Yuvonne clinical team.  Plan: Discussed symptom management clinic. Discussed palliative care services. Discussed resources that are available here at the cancer center. Discussed medications and new prescriptions to begin treatment such as anti-nausea or steroids.  The patient is a with newly diagnosed.  Patient presents to clinic today for chemotherapy education and palliative care consult.  We will start.  We will send in prescriptions for prochlorperazine and ondansetron .  The patient verbalizes understanding of and agreement to the plan as discussed today.  Provided general information including the following: 1.  Date of education: 01/06/2024 2.  Physician name: Wanda Cornish, MD 3.  Diagnosis: SCLC 4.  Stage: Stage IVB 5.  Palliative 6.  Chemotherapy plan including drugs and how often: Carboplatin, Etoposide, Atezolizumab every 3 weeks 7.  Start date: Pending 8.  Other referrals: None 9.  The patient is to call our office with any questions or concerns.  Our office number [phone], if after hours or on the weekend, call the same number and wait for the answering service.  There is always an oncologist on call 10.  Medications prescribed: Ondansetron , Prochlorperazine 11.  The patient has verbalized understanding of the treatment plan and has no barriers to adherence or understanding.  Obtained signed consent from patient.  Discussed symptoms including 1.  Low blood counts including red blood cells, white blood cells and platelets. 2. Infection including to avoid large crowds, wash hands frequently, and stay away from people who were sick.  If fever develops of 100.4 or higher, call our office. 3.  Mucositis-given  instructions on mouth rinse (baking soda and salt mixture).  Keep mouth clean.  Use soft bristle toothbrush.  If mouth sores develop, call our clinic. 4.  Nausea/vomiting-gave prescriptions for ondansetron  4 mg every 4 hours as needed for nausea, may take around the clock if persistent.  Compazine 10 mg every 6 hours, may take around the clock if persistent. 5.  Diarrhea-use over-the-counter Imodium.  Call clinic if not controlled. 6.  Constipation-use senna, 1 to 2 tablets twice a day.  If no BM in 2 to 3 days call the clinic. 7.  Loss of appetite-try to eat small meals every 2-3 hours.  Call clinic if not eating. 8.  Taste changes-zinc 500 mg daily.  If becomes severe call clinic. 9.  Alcoholic beverages. 10.  Drink 2 to 3 quarts of water per  day. 11.  Peripheral neuropathy-patient to call if numbness or tingling in hands or feet is persistent  Neulasta-will be given 24 to 48 hours after chemotherapy.  Gave information sheet on bone and joint pain.  Use Claritin or Pepcid.  May use ibuprofen or Aleve.  Call if symptoms persist or are unbearable.  Gave information on the supportive care team and how to contact them regarding services.  Discussed advanced directives.  The patient does not have their advanced directives but will look at the copy provided in their notebook and will call with any questions. [employer] Financial Social worker Advanced directives  Answered questions to patient satisfaction.  Patient is to call with any further questions or concerns.  Time spent on this palliative care/chemotherapy education was 60 minutes with more than 50% spent discussing diagnosis, prognosis and symptom management.  The medication prescribed to the patient will be printed out from [email or web address] This will give the following information: Name of your medication Approved uses Dose and schedule Storage and handling Handling body fluids and waste Drug and food  interactions Possible side effects and management Pregnancy, sexual activity, and contraception Obtaining medication   Disposition: RTC on   Visit Diagnosis No diagnosis found.  Patient expressed understanding and was in agreement with this plan. She also understands that She can call clinic at any time with any questions, concerns, or complaints.   I provided 60 minutes of face to face during this encounter, and > 50% was spent counseling as documented under my assessment & plan.   Eleanor Bach, FNP- [facility] [place] [phone]

## 2024-01-17 ENCOUNTER — Other Ambulatory Visit: Payer: Self-pay | Admitting: Hematology and Oncology

## 2024-01-17 ENCOUNTER — Telehealth: Payer: Self-pay

## 2024-01-17 ENCOUNTER — Other Ambulatory Visit: Payer: Self-pay

## 2024-01-17 ENCOUNTER — Ambulatory Visit
Admission: RE | Admit: 2024-01-17 | Discharge: 2024-01-17 | Disposition: A | Discharge status: Home / Self Care | Source: Ambulatory Visit | Attending: Radiation Oncology | Admitting: Radiation Oncology

## 2024-01-17 DIAGNOSIS — Z510 Encounter for antineoplastic radiation therapy: Secondary | ICD-10-CM | POA: Diagnosis not present

## 2024-01-17 DIAGNOSIS — Z5111 Encounter for antineoplastic chemotherapy: Secondary | ICD-10-CM | POA: Diagnosis not present

## 2024-01-17 DIAGNOSIS — C3411 Malignant neoplasm of upper lobe, right bronchus or lung: Secondary | ICD-10-CM | POA: Diagnosis not present

## 2024-01-17 DIAGNOSIS — C342 Malignant neoplasm of middle lobe, bronchus or lung: Secondary | ICD-10-CM | POA: Diagnosis not present

## 2024-01-17 DIAGNOSIS — E871 Hypo-osmolality and hyponatremia: Secondary | ICD-10-CM | POA: Diagnosis not present

## 2024-01-17 DIAGNOSIS — Z87891 Personal history of nicotine dependence: Secondary | ICD-10-CM | POA: Diagnosis not present

## 2024-01-17 DIAGNOSIS — C787 Secondary malignant neoplasm of liver and intrahepatic bile duct: Secondary | ICD-10-CM | POA: Diagnosis not present

## 2024-01-17 LAB — RAD ONC ARIA SESSION SUMMARY
Course Elapsed Days: 4
Plan Fractions Treated to Date: 1
Plan Prescribed Dose Per Fraction: 2 Gy
Plan Total Fractions Prescribed: 8
Plan Total Prescribed Dose: 16 Gy
Reference Point Dosage Given to Date: 2 Gy
Reference Point Session Dosage Given: 2 Gy
Session Number: 3

## 2024-01-17 MED ORDER — FLUCONAZOLE 150 MG PO TABS: 150.0000 mg | ORAL_TABLET | Freq: Every day | ORAL | 3 refills | Status: DC

## 2024-01-17 MED ORDER — NYSTATIN 100000 UNIT/GM EX POWD: 1.0000 | Freq: Three times a day (TID) | CUTANEOUS | 0 refills | Status: AC

## 2024-01-17 NOTE — Telephone Encounter (Signed)
 Beth from [employer] left a message patient wants a portable oxygen  concentrator. They need a order with liter flow rate on it faxed to [phone]. Beth's phone number @[employer]  [employer] [facility] is [phone].

## 2024-01-17 NOTE — Telephone Encounter (Signed)
 Pt LVM on nurse line asking to get a message to [facility]. I have a nasty yeast infection under my breast and I think it is starting down below. She has appt for radiation @ 1145 (& they are doing PUT's today in radiation - Angela,LPN, is covering radiation) if you'd like to take a look.

## 2024-01-18 ENCOUNTER — Telehealth: Payer: Self-pay

## 2024-01-18 ENCOUNTER — Ambulatory Visit

## 2024-01-18 ENCOUNTER — Ambulatory Visit: Admitting: Radiation Oncology

## 2024-01-18 DIAGNOSIS — Z859 Personal history of malignant neoplasm, unspecified: Secondary | ICD-10-CM | POA: Diagnosis not present

## 2024-01-18 DIAGNOSIS — D649 Anemia, unspecified: Secondary | ICD-10-CM | POA: Diagnosis not present

## 2024-01-18 DIAGNOSIS — D701 Agranulocytosis secondary to cancer chemotherapy: Secondary | ICD-10-CM | POA: Diagnosis not present

## 2024-01-18 DIAGNOSIS — C799 Secondary malignant neoplasm of unspecified site: Secondary | ICD-10-CM | POA: Diagnosis not present

## 2024-01-18 DIAGNOSIS — C3490 Malignant neoplasm of unspecified part of unspecified bronchus or lung: Secondary | ICD-10-CM | POA: Diagnosis not present

## 2024-01-18 DIAGNOSIS — E871 Hypo-osmolality and hyponatremia: Secondary | ICD-10-CM | POA: Diagnosis not present

## 2024-01-18 DIAGNOSIS — R0602 Shortness of breath: Secondary | ICD-10-CM | POA: Diagnosis not present

## 2024-01-18 DIAGNOSIS — T451X5A Adverse effect of antineoplastic and immunosuppressive drugs, initial encounter: Secondary | ICD-10-CM | POA: Diagnosis not present

## 2024-01-18 NOTE — Telephone Encounter (Signed)
 New order has been faxed

## 2024-01-18 NOTE — Telephone Encounter (Signed)
 Grand-daughter Heather Chavez has called stating that patient was in bathroom complaining that it hurts to take a deep breath, hurts across shoulder blades, all over pain and no color to patient. Spoke with Eleanor Bach, FNP-BC and she recommends patient to go to Emergency Room to get evaluated. Informed Heather Chavez of the recommendations and she is in agreement. [facility] also aware due to patent scheduled for radiation treatment.

## 2024-01-19 ENCOUNTER — Other Ambulatory Visit (HOSPITAL_BASED_OUTPATIENT_CLINIC_OR_DEPARTMENT_OTHER): Payer: Self-pay

## 2024-01-19 ENCOUNTER — Ambulatory Visit
Admission: RE | Admit: 2024-01-19 | Discharge: 2024-01-19 | Disposition: A | Discharge status: Home / Self Care | Source: Ambulatory Visit | Attending: Radiation Oncology | Admitting: Radiation Oncology

## 2024-01-19 ENCOUNTER — Inpatient Hospital Stay

## 2024-01-19 ENCOUNTER — Other Ambulatory Visit: Payer: Self-pay | Admitting: Oncology

## 2024-01-19 ENCOUNTER — Encounter: Payer: Self-pay | Admitting: Oncology

## 2024-01-19 ENCOUNTER — Inpatient Hospital Stay (HOSPITAL_BASED_OUTPATIENT_CLINIC_OR_DEPARTMENT_OTHER): Admitting: Oncology

## 2024-01-19 ENCOUNTER — Other Ambulatory Visit: Payer: Self-pay

## 2024-01-19 VITALS — BP 125/71 | HR 91 | Temp 98.1°F | Resp 20 | Ht 65.0 in

## 2024-01-19 DIAGNOSIS — C787 Secondary malignant neoplasm of liver and intrahepatic bile duct: Secondary | ICD-10-CM | POA: Diagnosis not present

## 2024-01-19 DIAGNOSIS — E871 Hypo-osmolality and hyponatremia: Secondary | ICD-10-CM | POA: Diagnosis not present

## 2024-01-19 DIAGNOSIS — I871 Compression of vein: Secondary | ICD-10-CM | POA: Diagnosis not present

## 2024-01-19 DIAGNOSIS — C3431 Malignant neoplasm of lower lobe, right bronchus or lung: Secondary | ICD-10-CM

## 2024-01-19 DIAGNOSIS — C3411 Malignant neoplasm of upper lobe, right bronchus or lung: Secondary | ICD-10-CM | POA: Diagnosis not present

## 2024-01-19 DIAGNOSIS — Z5111 Encounter for antineoplastic chemotherapy: Secondary | ICD-10-CM | POA: Diagnosis not present

## 2024-01-19 DIAGNOSIS — Z510 Encounter for antineoplastic radiation therapy: Secondary | ICD-10-CM | POA: Diagnosis not present

## 2024-01-19 LAB — RAD ONC ARIA SESSION SUMMARY
Course Elapsed Days: 6
Plan Fractions Treated to Date: 2
Plan Prescribed Dose Per Fraction: 2 Gy
Plan Total Fractions Prescribed: 8
Plan Total Prescribed Dose: 16 Gy
Reference Point Dosage Given to Date: 4 Gy
Reference Point Session Dosage Given: 2 Gy
Session Number: 4

## 2024-01-19 LAB — PREPARE RBC (CROSSMATCH)

## 2024-01-19 LAB — CMP (CANCER CENTER ONLY)
ALT: 98 U/L — ABNORMAL HIGH (ref 0–44)
AST: 87 U/L — ABNORMAL HIGH (ref 15–41)
Albumin: 3.5 g/dL (ref 3.5–5.0)
Alkaline Phosphatase: 128 U/L — ABNORMAL HIGH (ref 38–126)
Anion gap: 10 (ref 5–15)
BUN: 14 mg/dL (ref 8–23)
CO2: 27 mmol/L (ref 22–32)
Calcium: 9.1 mg/dL (ref 8.9–10.3)
Chloride: 88 mmol/L — ABNORMAL LOW (ref 98–111)
Creatinine: 0.79 mg/dL (ref 0.44–1.00)
GFR, Estimated: >60 mL/min (ref >60)
Glucose, Bld: 150 mg/dL — ABNORMAL HIGH (ref 70–99)
Potassium: 4.5 mmol/L (ref 3.5–5.1)
Sodium: 125 mmol/L — ABNORMAL LOW (ref 135–145)
Total Bilirubin: 0.5 mg/dL (ref 0.0–1.2)
Total Protein: 6.3 g/dL — ABNORMAL LOW (ref 6.5–8.1)

## 2024-01-19 LAB — CBC WITH DIFFERENTIAL (CANCER CENTER ONLY)
Abs Immature Granulocytes: 0.05 K/uL (ref 0.00–0.07)
Basophils Absolute: 0 K/uL (ref 0.0–0.1)
Basophils Relative: 1 %
Eosinophils Absolute: 0 K/uL (ref 0.0–0.5)
Eosinophils Relative: 0 %
HCT: 22.3 % — ABNORMAL LOW (ref 36.0–46.0)
Hemoglobin: 7.6 g/dL — ABNORMAL LOW (ref 12.0–15.0)
Immature Granulocytes: 2 %
Lymphocytes Relative: 19 %
Lymphs Abs: 0.4 K/uL — ABNORMAL LOW (ref 0.7–4.0)
MCH: 30.2 pg (ref 26.0–34.0)
MCHC: 34.1 g/dL (ref 30.0–36.0)
MCV: 88.5 fL (ref 80.0–100.0)
Monocytes Absolute: 0 K/uL — ABNORMAL LOW (ref 0.1–1.0)
Monocytes Relative: 1 %
Neutro Abs: 1.7 K/uL (ref 1.7–7.7)
Neutrophils Relative %: 77 %
Platelet Count: 116 K/uL — ABNORMAL LOW (ref 150–400)
RBC: 2.52 MIL/uL — ABNORMAL LOW (ref 3.87–5.11)
RDW: 14 % (ref 11.5–15.5)
WBC Count: 2.2 K/uL — ABNORMAL LOW (ref 4.0–10.5)
nRBC: 0 % (ref 0.0–0.2)

## 2024-01-19 LAB — SAMPLE TO BLOOD BANK

## 2024-01-19 LAB — MAGNESIUM: Magnesium: 1.9 mg/dL (ref 1.7–2.4)

## 2024-01-19 LAB — ABO/RH: ABO/RH(D): A POS

## 2024-01-19 MED ORDER — SODIUM CHLORIDE 0.9 % IV SOLN: INTRAVENOUS | Status: AC

## 2024-01-19 NOTE — Progress Notes (Signed)
 [facility]  [address] [place],  [place]  [address] [phone]  Clinic Day:  01/19/24  Referring physician: [name], MD  ASSESSMENT & PLAN:  Assessment:  Small Cell Lung Cancer This is newly diagnosed but found to be a stage IV based on her liver metastases seen on PET.  She also has mediastinal adenopathy, pleural based mass, and small pleural effusion. The MRI of the brain was negative for metastatic disease but unfortunately she was found to have liver metastases, so she has extensive disease stage IV. She received her first cycle of chemotherapy on 01/10/2024 with Carboplatin , Etopiside, and Atezolizumab . Her blood counts are decreasing as expected.   Superior Vena Cava Syndrome She developed worsening swelling of the upper extremities, neck, and face consistent with superior vena cava syndrome. We therefore requested [name] do some palliative radiation and that was started last week. She already has improvement of the edema of her upper extremities. She is now having increasing discomfort in the upper chest and neck and I think this is related to the bulky mediastinal disease. She was given 1 dose of IV steroids yesterday in the ED.   Hyponatremia This is likely caused by her small cell lung cancer and will be monitored closely. Hopefully it will respond to treatment but right now is fluctuating up and down.   Hypomagnesemia The level was 1.5 yesterday and she was given IV magnesium  yesterday with improvement to 1.9 today.   Pneumonia This is multifocal and at least partially related to post obstruction of the right lower main bronchus. This is improving. CT angio in the ER was negative for PE.   Anemia This is likely related to her new small cell lung cancer and bilateral pneumonia but I will check her vitamin levels. She has no vitamin deficiency and in fact her B-12 level was over 4,000. Her hemoglobin is now down to 8 yesterday from, 7.6 today and  dropping, so we will transfuse 1 unit of PRBC's this week.   Thrombocytopenia This is mild and due to chemotherapy so it will be monitored.    History of Stage IA (T1b N0 M0) hormone receptor positive right breast cancer This was diagnosed in April, 2005. She was treated with a lumpectomy and pathology revealed a 6mm, grade 3, invasive ductal carcinoma with sentinel node positive with a 3mm micrometastasis. Estrogen and progesterone receptors were positive and her 2 neu negative. She was treated with 4 cycles of doxorubicin and cyclophosphamide. She was then placed on Anastrozole in November, 2005 and took that for 3.6yrs, but was switched to Tamoxifen in April, 2009 because of her arthralgias related to Anastrozole. She completed 5 years of adjuvant hormonal therapy.    History of Stage IA (T1b N0 M0) hormone receptor positive left breast cancer This was diagnosed in July, 2015. This was treated with lumpectomy and pathology revealed a 6mm, grade 1, ductal carcinoma with negative sentinel node. Estrogen and progesterone receptors were positive and her 2 neu negative and Ki 67 was 18%. She received adjuvant radiation to the left breast and was then placed on Anastrozole again in August, 2015. This was stopped in October, 2016 due to severe hot flashes, anxiety, and depression, mild cognitive difficulty, trouble finding words, and remembering things as well as becoming easily overwhelmed. She did not tolerate venlafaxine and was placed on paroxetine 20mg  daily in September, 2015 and this improved her symptoms. She was later changed to Lexapro. She was placed on Tamoxifen  20 mg daily for adjuvant hormonal therapy for 5 years.   Plan: She received her first cycle of chemotherapy on 01/10/2024 with Carboplatin , Etopiside, and Atezolizumab . She developed worsening swelling of the upper extremities, neck, and face consistent with superior vena cava syndrome. We therefore requested [name] do some  palliative radiation and that was started last week. She did not receive a Neulasta  injection because she is now on radiation.  We were scheduled to see her yesterday but she ended up in the ED with worsening dyspnea with pain in the upper chest and neck. At that time her WBC was 2.9 with an ANC of 2500, hemoglobin of 8.0, and platelet count of 133,000. Her sodium was down to 124 last week and she was given IV saline with improvement to 129 by 11/07. Yesterday the sodium was back down to 126 and her magnesium  was low at 1.5 so she was given IV saline and magnesium . They gave her hydrocodone 5-325mg  for the pain and she is concerned about the potential of impending constipation as she is taking these every 6hrs around the clock and will probably need a refill by the weekend. Her day 1 cycle 2 of Carboplatin , Etopiside, and Atezolizumab  is scheduled on 01/31/2024. She has a WBC of 2.2 with an ANC of 400, low hemoglobin of 7.6, and low platelet count of 116,000. I explained that she would benefit from an transfusion, to which she agreed. I will order 1 unit of PRBC's for her to receive on 11/14 at noon. She has a low sodium of 125, total protein of 6.3, an elevated glucose of 150, AST of 87, ALT of 98, and alkaline phosphatase of 128. The rest of her CMP is normal and her magnesium  is normal at 1.9. She will receive IV normal saline and radiation after her appointment with me today. Patient will see her pulmonologist, Dr. Darlean on 02/28/2024. She will continue daily radiation. She will return this Friday for another round of labs and IV fluids. She will also need a follow-up appointment next week so that we can monitor her sodium, blood counts, and clinical status closely. Each time we will check CBC, CMP, and magnesium  level. I reassured the patient and her daughter that it is just so early in her treatment, that some of these sensations are to be expected as the tumor is treated.   I provided 32 minutes of  face-to-face time during this this encounter and > 50% was spent counseling as documented under my assessment and plan.   Heather VEAR Cornish, MD [facility] A DEPT OF [facility] [address] [place] [address] Dept: [phone] Dept Fax: [phone]   No orders of the defined types were placed in this encounter.  CHIEF COMPLAINT:  CC: Small Cell Lung Cancer  Current Treatment:  Chemotherapy and Immunotherapy   HISTORY OF PRESENT ILLNESS:  Heather Chavez is a 78 y.o. female with a history of bilateral breast cancer who is referred in consultation by Dr. [name] for assessment and management of newly diagnosed small cell lung cancer. She has a history of stage 1A right breast cancer 06/2003 (s/p lumpectomy, doxorubicin + cyclophosphamide, and 5 years of adjuvant hormonal therapy with tamoxifen/anastrozole), stage 1A left breast cancer (s/p lumpectomy, adjuvant radiation, and 5 years of adjuvant hormonal therapy with tamoxifen/anastrozole), COPD/asthma overlap, and tobacco use.  She underwent testing for hereditary breast and ovarian cancer with the [employer]  OvaNext 25 gene panel, which did not reveal any clinically significant mutation. She did have a variant of uncertain significance of MSH2. She had a previous hysterectomy and unilateral oophorectomy for benign disease at a fairly young age, so does not require pap smears. Screening colonoscopy in February, 2017 revealed multiple polyps, all of which were benign polypoid colonic mucosa.  Khalil was admitted into the [facility] ED on October 20th, 2025 and presented with shortness of breath, fevers, and chills. She received a chest x-ray for further evaluation which revealed an right upper lobe infiltrate versus mass and chest CT angio revealed emphysema with chronic interstitial changes in both lung fields corresponding with multifocal pneumonitis and a suspicious  pleural based mass in the right upper lobe measuring 3.9cm, and multiple suspicious enlarged mediastinal lymph nodes.  She was diagnosed with mass of the right lung, acute hypoxic respiratory failure, and bilateral community acquired pneumonia before being transferred to [facility]. She then had bronchoscopy and biopsy and pathology report was positive for small cell carcinoma with positive tumor cells for TTF-1 and neuroendocrine markers CD56, synaptophysin, and chromogranin. GATA-3 is negative and Pan keratin showed dot-like positive staining. She comes to me for discussion of staging and treatment and had a PET scan and brain MRI today but results are pending. I recommend systemic chemotherapy with Carboplatin  and Etopiside. She will also have immunotherapy, either concurrent or sequential depending on her staging. She received her first cycle of chemotherapy on 01/10/2024 with Carboplatin , Etopiside, and Atezolizumab .   I have reviewed her chart and materials related to her cancer extensively and collaborated history with the patient. Summary of oncologic history is as follows: Oncology History  Lung cancer, lower lobe (HCC)  01/05/2024 Initial Diagnosis   Lung cancer, lower lobe (HCC)   01/06/2024 Cancer Staging   Staging form: Lung, AJCC V9 - Clinical stage from 01/06/2024: Stage IVB (cT4, cN2b, cM1c1) - Signed by Cornelius Heather DEL, MD on 01/06/2024 Histopathologic type: Small cell carcinoma, NOS Stage prefix: Initial diagnosis Method of lymph node assessment: Clinical IASLC grade: G3 Histologic grading system: 3 grade system Laterality: Right Tumor size (mm): 56 Sites of metastasis: Liver Lymph-vascular invasion (LVI): Presence of LVI unknown/indeterminate Diagnostic confirmation: Positive histology Specimen type: Bronchial Biopsy Staged by: Managing physician Standard uptake value (SUV): 12.5 Perineural invasion (PNI): Unknown Pleural/elastic layer invasion:  Unknown Stage used in treatment planning: Yes National guidelines used in treatment planning: Yes Type of national guideline used in treatment planning: NCCN   01/10/2024 -  Chemotherapy   Patient is on Treatment Plan : LUNG SCLC Carboplatin  + Etoposide  + Atezolizumab  Induction q21d x 4 cycles / Atezolizumab  Maintenance q21d      INTERVAL HISTORY:  Berenice is seen in the clinic for follow up of her small cell carcinoma of the lung. She has a remote history of bilateral breast cancers and bilateral breast irradiation. She received her first cycle of chemotherapy on 01/10/2024 with Carboplatin , Etopiside, and Atezolizumab . She developed worsening swelling of the upper extremities, neck, and face consistent with superior vena cava syndrome. We therefore requested [name] do some palliative radiation and that was started last week. She did not receive a Neulasta  injection because she is now on radiation.  We were scheduled to see her yesterday but she ended up in the ED with worsening dyspnea with pain in the upper chest and neck. At that time her WBC was 2.9 with an ANC of 2500, hemoglobin of 8.0, and platelet count of 133,000. Her  sodium was down to 124 last week and she was given IV saline with improvement to 129 by 11/07. Yesterday the sodium was back down to 126 and her magnesium  was low at 1.5 so she was given IV saline and magnesium . They gave her hydrocodone 5-325mg  for the pain and she is concerned about the potential of impending constipation as she is taking these every 6hrs around the clock and will probably need a refill by the weekend. Patient states that she feels ok but complains of constipation, a burning discomfort in her upper chest that worsens between her shoulder blades with deep breaths, and shortness of breath per her daughter. Her day 1 cycle 2 of Carboplatin , Etopiside, and Atezolizumab  is scheduled on 01/31/2024. She has a WBC of 2.2 with an ANC of 400, low hemoglobin of 7.6, and  low platelet count of 116,000. I explained that she would benefit from an transfusion, to which she agreed. I will order 1 unit of PRBC's for her to receive on 11/14 at noon. She has a low sodium of 125, total protein of 6.3, an elevated glucose of 150, AST of 87, ALT of 98, and alkaline phosphatase of 128. The rest of her CMP is normal and her magnesium  is normal at 1.9. She will receive IV normal saline and radiation after her appointment with me today. Patient will see her pulmonologist, Dr. Darlean on 02/28/2024. She will continue daily radiation. She will return this Friday for another round of labs and IV fluids. She will also need a follow-up appointment next week so that we can monitor her sodium, blood counts, and clinical status closely. Each time we will check CBC, CMP, and magnesium  level. I reassured the patient and her daughter that it is just so early in her treatment, that some of these sensations are to be expected as the tumor is treated.   She denies fever, chills, night sweats, or other signs of infection. She denies gastrointestinal issues.  Her appetite is decreased and she could not be weighed today due to being in a wheelchair and weakness. This patient is accompanied in the office by her daughter.  HISTORY:   Past Medical History:  Diagnosis Date   Advanced nonexudative age-related macular degeneration of right eye with subfoveal involvement 09/21/2019   Advanced nonexudative age-related macular degeneration of right eye without subfoveal involvement 09/21/2019   Allergic rhinitis    Angina pectoris 05/17/2020   Bilateral breast cancer (HCC) 11/23/2013   BMI 34.0-34.9,adult 09/25/2019   Breast cancer (HCC)    Bronchitis, chronic (HCC)    CAD (coronary artery disease)    Cancer (HCC)    Cardiac murmur 05/17/2020   Coronary artery calcification 05/17/2020   Degenerative retinal drusen of right eye 09/21/2019   Diabetes mellitus due to underlying condition with unspecified  complications (HCC) 05/17/2020   Diverticulosis    Dry eyes 06/25/2016   Dyspnea    Emphysema/COPD    Essential hypertension 05/17/2020   Estrogen receptor positive neoplasm 09/24/2015   Family history of malignant neoplasm of breast 11/23/2013   Family history of peritoneal cancer 11/23/2013   Fuchs' corneal dystrophy 11/03/2016   GERD (gastroesophageal reflux disease)    History of colon polyps    History of right breast cancer 09/19/2018   Hypertension    IBS (irritable bowel syndrome)    with D   Intermediate stage nonexudative age-related macular degeneration of left eye 09/21/2019   Iritis 11/03/2016   Macular degeneration    Major depressive  disorder    Malignant neoplasm of upper-inner quadrant of left breast in female, estrogen receptor positive (HCC) 09/01/2016   Mixed dyslipidemia 05/17/2020   Obesity    Obesity (BMI 35.0-39.9 without comorbidity) 05/17/2020   OSA on CPAP 10/29/2020   Diagnosed some 3 months prior after pneumonia episode, Dr. Shellia pulmonology   Peripheral vascular disease    Personal history of chemotherapy    Personal history of radiation therapy    Pseudophakia 09/21/2019   Right-sided chest wall pain    SVT (supraventricular tachycardia) 05/23/2020   Varicose veins of left lower extremity with complications 08/11/2016   Past Surgical History:  Procedure Laterality Date   BREAST BIOPSY     BREAST LUMPECTOMY     right brwast 2005/ left 2015   CATARACT EXTRACTION     CHOLECYSTECTOMY     COLONOSCOPY  04/17/2015   Colonic polyps status post polypectomy. Moderate to severe sigmoid diverticulosis   ENDOVENOUS ABLATION SAPHENOUS VEIN W/ LASER Right 02/19/2023   endovenous laser ablation right greater saphenous vein and stab phlebectomy 10-20 incisions right leg by Gaile New MD   TUBAL LIGATION     VAGINAL HYSTERECTOMY     VEIN LIGATION AND STRIPPING     Family History  Problem Relation Age of Onset   Cancer Mother 29       bilateral  breast cancer ages 96 then 16. Peritoneal cancer at age 49.   Other Mother        Ms. Boulanger's mother was adopted so have no information regarding maternal family  history other than her mother   Breast cancer Mother    Cancer Father 53       renal   Cancer Sister 99       breast   Breast cancer Sister    Breast cancer Daughter    Breast cancer Maternal Aunt    Breast cancer Paternal Aunt    Breast cancer Paternal Grandmother    Cancer Paternal Grandmother        bilateral breast cancer at unknown age   Social History:  reports that she quit smoking about 42 years ago. Her smoking use included cigarettes. She started smoking about 57 years ago. She has a 37.5 pack-year smoking history. She has never used smokeless tobacco. She reports current alcohol use. She reports that she does not use drugs.The patient is accompanied by her daughter and granddaughter today.  Allergies:  Allergies  Allergen Reactions   Codeine Nausea And Vomiting and Other (See Comments)    Other reaction(s): Other (see comments), Vomiting   Levofloxacin Other (See Comments)    Leg cramps   Nexlizet  [Bempedoic Acid-Ezetimibe ] Other (See Comments)   Pitavastatin  Other (See Comments)    Leg cramps  Other Reaction(s): Other (See Comments)  Leg cramps    Leg cramps   Rosuvastatin  Other (See Comments)    Muscle pain    Current Medications: Current Outpatient Medications  Medication Sig Dispense Refill   HYDROcodone-acetaminophen  (NORCO/VICODIN) 5-325 MG tablet Take 1 tablet by mouth every 6 (six) hours as needed for moderate pain (pain score 4-6). (Patient not taking: Reported on 01/28/2024)     albuterol  (VENTOLIN  HFA) 108 (90 Base) MCG/ACT inhaler Inhale 2 puffs into the lungs every 4 (four) hours as needed for wheezing.     allopurinol  (ZYLOPRIM ) 300 MG tablet Take 1 tablet (300 mg total) by mouth daily. 30 tablet 0   Biotin 89999 MCG TABS Take 10,000 mcg by mouth daily.  Boswellia-Glucosamine-Vit D  (OSTEO BI-FLEX ONE PER DAY) TABS Take by mouth.     budesonide -formoterol  (SYMBICORT ) 80-4.5 MCG/ACT inhaler Inhale 2 puffs into the lungs 2 (two) times daily.     chlorpheniramine-HYDROcodone (TUSSIONEX) 10-8 MG/5ML Take 5 mLs by mouth every 12 (twelve) hours as needed for cough. 473 mL 0   dextromethorphan-guaiFENesin (ROBITUSSIN-DM) 10-100 MG/5ML liquid Take 5 mLs by mouth every 6 (six) hours as needed for cough.     diltiazem  (CARDIZEM  CD) 120 MG 24 hr capsule Take 120 mg by mouth daily.     fexofenadine (ALLEGRA) 180 MG tablet Take 180 mg by mouth.     fluocinonide cream (LIDEX) 0.05 % Apply 1 Application topically 3 (three) times daily.     FLUoxetine (PROZAC) 20 MG capsule Take 20 mg by mouth daily.     furosemide (LASIX) 40 MG tablet Take 40 mg by mouth.     gabapentin (NEURONTIN) 600 MG tablet Take 600 mg by mouth 2 (two) times daily.     GARLIC PO Take 1 tablet by mouth daily.     ipratropium-albuterol  (DUONEB) 0.5-2.5 (3) MG/3ML SOLN Take 3 mLs by nebulization every 4 (four) hours as needed (breathing treatment).     LORazepam  (ATIVAN ) 0.5 MG tablet Take by mouth.     magic mouthwash w/lidocaine  SOLN Take 5 mLs by mouth 4 (four) times daily. Suspension contains equal amounts of Maalox Extra Strength, nystatin , diphenhydramine  and lidocaine .     magnesium  oxide (MAG-OX) 400 (240 Mg) MG tablet Take 1 tablet (400 mg total) by mouth daily. 30 tablet 5   metFORMIN (GLUCOPHAGE) 500 MG tablet Take 500 mg by mouth daily.     metoprolol  tartrate (LOPRESSOR ) 100 MG tablet Take 150 mg by mouth 2 (two) times daily.     montelukast (SINGULAIR) 10 MG tablet Take 10 mg by mouth at bedtime.     Multiple Vitamins-Minerals (PRESERVISION AREDS 2 PO) Take by mouth.     nitrofurantoin (MACRODANTIN) 100 MG capsule Take 100 mg by mouth 2 (two) times daily.     nitroGLYCERIN  (NITROSTAT ) 0.4 MG SL tablet Place 0.4 mg under the tongue every 5 (five) minutes as needed for chest pain.     nystatin   (MYCOSTATIN /NYSTOP ) powder Apply 1 Application topically 3 (three) times daily. 15 g 0   ondansetron  (ZOFRAN ) 4 MG tablet Take 1 tablet (4 mg total) by mouth every 4 (four) hours as needed for nausea. 90 tablet 3   ondansetron  (ZOFRAN -ODT) 4 MG disintegrating tablet Take 4 mg by mouth.     OXYGEN  Inhale 2.5 L/min into the lungs continuous.     pantoprazole  (PROTONIX ) 40 MG tablet Take 1 tablet (40 mg total) by mouth daily. Call [phone] to schedule an office visit for more refills (Patient taking differently: Take 40 mg by mouth 2 (two) times daily. Call [phone] to schedule an office visit for more refills) 90 tablet 0   potassium chloride  SA (KLOR-CON  M) 20 MEQ tablet Take 1 tablet (20 mEq total) by mouth daily. 30 tablet 5   prochlorperazine  (COMPAZINE ) 10 MG tablet Take 1 tablet (10 mg total) by mouth every 6 (six) hours as needed for nausea or vomiting. 90 tablet 3   vitamin E 180 MG (400 UNITS) capsule Take 400 Units by mouth daily.     zinc gluconate 50 MG tablet Take 50 mg by mouth daily.     No current facility-administered medications for this visit.    REVIEW OF SYSTEMS:  Review of Systems  Constitutional:  Positive for appetite change (decreased) and fatigue. Negative for chills, diaphoresis, fever and unexpected weight change.  HENT:  Negative.  Negative for hearing loss, lump/mass, mouth sores, nosebleeds, sore throat, tinnitus, trouble swallowing and voice change.   Eyes:  Positive for eye problems (vision changes).  Respiratory:  Positive for chest tightness, cough and shortness of breath. Negative for hemoptysis and wheezing.   Cardiovascular:  Positive for chest pain. Negative for leg swelling and palpitations.  Gastrointestinal:  Negative for abdominal distention, abdominal pain, blood in stool, constipation, diarrhea, nausea, rectal pain and vomiting.       Bowel incontinence upon straining  Endocrine: Negative.   Genitourinary:  Negative for bladder incontinence,  difficulty urinating, dyspareunia, dysuria, frequency, hematuria, menstrual problem, nocturia, pelvic pain, vaginal bleeding and vaginal discharge.   Musculoskeletal:  Positive for neck pain. Negative for arthralgias, back pain, flank pain, gait problem, myalgias and neck stiffness.  Skin: Negative.  Negative for itching, rash and wound.  Neurological:  Positive for headaches and numbness (neuropathy of bilateral feet). Negative for dizziness, extremity weakness, gait problem, light-headedness, seizures and speech difficulty.  Hematological: Negative.  Negative for adenopathy. Does not bruise/bleed easily.  Psychiatric/Behavioral: Negative.  Negative for depression and sleep disturbance. The patient is not nervous/anxious.    VITALS:  Blood pressure 125/71, pulse 91, temperature 98.1 F (36.7 C), temperature source Oral, resp. rate 20, height 5' 5 (1.651 m), SpO2 100%.  Wt Readings from Last 3 Encounters:  01/31/24 208 lb 4.8 oz (94.5 kg)  01/21/24 215 lb 8 oz (97.8 kg)  01/06/24 212 lb (96.2 kg)    Body mass index is 35.28 kg/m.  Performance status (ECOG): 1 - Symptomatic but completely ambulatory  PHYSICAL EXAM:  Physical Exam Vitals and nursing note reviewed. Exam conducted with a chaperone present.  Constitutional:      General: She is not in acute distress.    Appearance: Normal appearance. She is normal weight. She is not ill-appearing, toxic-appearing or diaphoretic.  HENT:     Head: Normocephalic and atraumatic.     Comments: Mild puffiness     Right Ear: Tympanic membrane, ear canal and external ear normal. There is no impacted cerumen.     Left Ear: Tympanic membrane, ear canal and external ear normal. There is no impacted cerumen.     Nose: Nose normal. No congestion or rhinorrhea.     Mouth/Throat:     Mouth: Mucous membranes are moist.     Pharynx: Oropharynx is clear. No oropharyngeal exudate or posterior oropharyngeal erythema.  Eyes:     General: No scleral  icterus.       Right eye: No discharge.        Left eye: No discharge.     Extraocular Movements: Extraocular movements intact.     Conjunctiva/sclera: Conjunctivae normal.     Pupils: Pupils are equal, round, and reactive to light.  Cardiovascular:     Rate and Rhythm: Normal rate and regular rhythm.     Pulses: Normal pulses.     Heart sounds: Normal heart sounds. No murmur heard.    No friction rub. No gallop.  Pulmonary:     Effort: Pulmonary effort is normal. No respiratory distress.     Breath sounds: Examination of the right-lower field reveals decreased breath sounds. Decreased breath sounds present.  Abdominal:     General: Bowel sounds are normal. There is no distension.     Palpations: Abdomen is soft. There is no hepatomegaly, splenomegaly  or mass.     Tenderness: There is no abdominal tenderness. There is no right CVA tenderness, left CVA tenderness, guarding or rebound.     Hernia: No hernia is present.     Comments: Feel the liver edge just below the right costal margin  Musculoskeletal:        General: Normal range of motion.     Left upper arm: No edema.     Cervical back: Normal range of motion and neck supple.     Right lower leg: 1+ Edema present.     Left lower leg: 1+ Edema present.  Lymphadenopathy:     Cervical: No cervical adenopathy.     Right cervical: No superficial, deep or posterior cervical adenopathy.    Left cervical: No superficial, deep or posterior cervical adenopathy.     Upper Body:     Right upper body: No supraclavicular, axillary or pectoral adenopathy.     Left upper body: No supraclavicular, axillary or pectoral adenopathy.  Skin:    General: Skin is warm and dry.  Neurological:     General: No focal deficit present.     Mental Status: She is alert and oriented to person, place, and time. Mental status is at baseline.  Psychiatric:        Mood and Affect: Mood normal.        Behavior: Behavior normal.        Thought Content: Thought  content normal.        Judgment: Judgment normal.    LABS:      Latest Ref Rng & Units 01/31/2024   12:49 PM 01/21/2024   11:14 AM 01/19/2024   11:06 AM  CBC  WBC 4.0 - 10.5 K/uL 8.4  0.7  2.2   Hemoglobin 12.0 - 15.0 g/dL 9.9  7.6  7.6   Hematocrit 36.0 - 46.0 % 30.9  23.1  22.3   Platelets 150 - 400 K/uL 260  94  116       Latest Ref Rng & Units 01/31/2024   12:49 PM 01/21/2024   11:14 AM 01/19/2024   11:06 AM  CMP  Glucose 70 - 99 mg/dL 893  861  849   BUN 8 - 23 mg/dL 14  17  14    Creatinine 0.44 - 1.00 mg/dL 9.25  9.16  9.20   Sodium 135 - 145 mmol/L 134  130  125   Potassium 3.5 - 5.1 mmol/L 4.1  4.3  4.5   Chloride 98 - 111 mmol/L 94  91  88   CO2 22 - 32 mmol/L 30  28  27    Calcium  8.9 - 10.3 mg/dL 9.4  9.4  9.1   Total Protein 6.5 - 8.1 g/dL 6.7  6.5  6.3   Total Bilirubin 0.0 - 1.2 mg/dL 0.3  0.4  0.5   Alkaline Phos 38 - 126 U/L 146  120  128   AST 15 - 41 U/L 30  41  87   ALT 0 - 44 U/L 30  74  98      Lab Results  Component Value Date   CEA 11.87 (H) 01/06/2024   /  CEA ([facility])  Date Value Ref Range Status  01/06/2024 11.87 (H) 0.00 - 5.00 ng/mL Final    Comment:    (NOTE) This test was performed using [employer] Coulter's paramagnetic chemiluminescent immunoassay. Values obtained from different assay methods cannot be used interchangeably. Please note that up to 8% of patients who  smoke may see values 5.1-10.0 ng/ml and 1% of patients who smoke may see CEA levels >10.0 ng/ml. Performed at [facility], [address], [place], [place] [address]    No results found for: PSA1 No results found for: CAN199 No results found for: CAN125  No results found for: STEPHANY RINGS, A1GS, A2GS, BETS, BETA2SER, GAMS, MSPIKE, SPEI Lab Results  Component Value Date   TIBC 316 01/10/2024   FERRITIN 297 01/10/2024   IRONPCTSAT 27 01/10/2024   No results found for: LDH  STUDIES:   Exam:  12/29/2023 Pathology Impression: Lung, right upper lobe, needle core biopsy: Small Cell Carcinoma The tumor cells are positive for TTF-1 and neuroendocrine markers CD56, synaptophysin, and chromogranin. GATA-3 is negative, Pan keratin shows dot-like positive staining.

## 2024-01-19 NOTE — Patient Instructions (Signed)
Hyponatremia Hyponatremia is when the amount of salt (sodium) in your blood is too low. When salt levels are low, your body cells may take in extra water. This can cause swelling. The swelling often affects the brain. What are the causes? Certain medical problems or conditions. Vomiting a lot. Having watery poop (diarrhea) often. Sweating too much. Taking certain medicines or using illegal drugs. Fluids given through an IV tube. What increases the risk? Having heart, kidney, or liver failure. Having a medical condition that causes you to have watery poop a lot. Doing very hard exercises. Taking medicines that affect the amount of salt that is in your blood. What are the signs or symptoms? Symptoms of this condition include: Headache. Feeling like you may vomit (nausea). Vomiting. Being very tired. Muscle weakness and cramps. Not wanting to eat as much as normal. Feeling weak or dizzy. Very bad symptoms of this condition include: Confusion. Feeling restless. Having a fast heart rate. Fainting. Seizures. Coma. How is this treated? Treatment for this condition depends on the cause. Treatment may include: Getting fluids through an IV tube that is put into one of your veins. Taking medicines to fix the salt levels in your blood. If medicines are causing the problem, your medicines will need to be changed. Limiting how much water or fluid you take in, in some cases. Monitoring in the hospital to watch your symptoms. Follow these instructions at home:  Take over-the-counter and prescription medicines only as told by your doctor. Many medicines can make this condition worse. Talk with your doctor about any medicines that you are taking. Do not drink alcohol. Keep all follow-up visits. Contact a doctor if: You feel more like you may vomit. You feel more tired. Your headache gets worse. You feel more confused. You feel weaker. Your symptoms go away and then they come back. Get  help right away if: You have a seizure. You faint. You keep having watery poop. You keep vomiting. Summary Hyponatremia is when the amount of salt in your blood is too low. When salt levels are low, you can have swelling throughout the body. The swelling mostly affects the brain. Treatment depends on the cause. Treatment may include IV fluids and changing medicines. This information is not intended to replace advice given to you by your health care provider. Make sure you discuss any questions you have with your health care provider. Document Revised: 09/03/2020 Document Reviewed: 09/03/2020 Elsevier Patient Education  2024 [employer].

## 2024-01-20 ENCOUNTER — Ambulatory Visit
Admission: RE | Admit: 2024-01-20 | Discharge: 2024-01-20 | Disposition: A | Discharge status: Home / Self Care | Source: Ambulatory Visit | Attending: Radiation Oncology | Admitting: Radiation Oncology

## 2024-01-20 ENCOUNTER — Other Ambulatory Visit: Payer: Self-pay

## 2024-01-20 DIAGNOSIS — E871 Hypo-osmolality and hyponatremia: Secondary | ICD-10-CM | POA: Diagnosis not present

## 2024-01-20 DIAGNOSIS — Z5111 Encounter for antineoplastic chemotherapy: Secondary | ICD-10-CM | POA: Diagnosis not present

## 2024-01-20 DIAGNOSIS — Z87891 Personal history of nicotine dependence: Secondary | ICD-10-CM | POA: Diagnosis not present

## 2024-01-20 DIAGNOSIS — C787 Secondary malignant neoplasm of liver and intrahepatic bile duct: Secondary | ICD-10-CM | POA: Diagnosis not present

## 2024-01-20 DIAGNOSIS — C342 Malignant neoplasm of middle lobe, bronchus or lung: Secondary | ICD-10-CM | POA: Diagnosis not present

## 2024-01-20 DIAGNOSIS — Z510 Encounter for antineoplastic radiation therapy: Secondary | ICD-10-CM | POA: Diagnosis not present

## 2024-01-20 DIAGNOSIS — C3411 Malignant neoplasm of upper lobe, right bronchus or lung: Secondary | ICD-10-CM | POA: Diagnosis not present

## 2024-01-20 LAB — RAD ONC ARIA SESSION SUMMARY
Course Elapsed Days: 7
Plan Fractions Treated to Date: 3
Plan Prescribed Dose Per Fraction: 2 Gy
Plan Total Fractions Prescribed: 8
Plan Total Prescribed Dose: 16 Gy
Reference Point Dosage Given to Date: 6 Gy
Reference Point Session Dosage Given: 2 Gy
Session Number: 5

## 2024-01-21 ENCOUNTER — Encounter: Payer: Self-pay | Admitting: Oncology

## 2024-01-21 ENCOUNTER — Inpatient Hospital Stay

## 2024-01-21 ENCOUNTER — Inpatient Hospital Stay (HOSPITAL_BASED_OUTPATIENT_CLINIC_OR_DEPARTMENT_OTHER): Admitting: Oncology

## 2024-01-21 ENCOUNTER — Inpatient Hospital Stay: Admitting: Oncology

## 2024-01-21 ENCOUNTER — Ambulatory Visit
Admission: RE | Admit: 2024-01-21 | Discharge: 2024-01-21 | Disposition: A | Discharge status: Home / Self Care | Source: Ambulatory Visit | Attending: Radiation Oncology | Admitting: Radiation Oncology

## 2024-01-21 ENCOUNTER — Telehealth: Payer: Self-pay

## 2024-01-21 ENCOUNTER — Other Ambulatory Visit: Payer: Self-pay | Admitting: Oncology

## 2024-01-21 ENCOUNTER — Other Ambulatory Visit: Payer: Self-pay

## 2024-01-21 VITALS — BP 135/89 | HR 97 | Temp 98.6°F | Resp 18 | Ht 65.0 in | Wt 215.5 lb

## 2024-01-21 DIAGNOSIS — Z5111 Encounter for antineoplastic chemotherapy: Secondary | ICD-10-CM | POA: Diagnosis not present

## 2024-01-21 DIAGNOSIS — E871 Hypo-osmolality and hyponatremia: Secondary | ICD-10-CM

## 2024-01-21 DIAGNOSIS — C3431 Malignant neoplasm of lower lobe, right bronchus or lung: Secondary | ICD-10-CM

## 2024-01-21 DIAGNOSIS — I871 Compression of vein: Secondary | ICD-10-CM

## 2024-01-21 DIAGNOSIS — C3411 Malignant neoplasm of upper lobe, right bronchus or lung: Secondary | ICD-10-CM | POA: Diagnosis not present

## 2024-01-21 DIAGNOSIS — Z510 Encounter for antineoplastic radiation therapy: Secondary | ICD-10-CM | POA: Diagnosis not present

## 2024-01-21 DIAGNOSIS — C787 Secondary malignant neoplasm of liver and intrahepatic bile duct: Secondary | ICD-10-CM | POA: Diagnosis not present

## 2024-01-21 LAB — CBC WITH DIFFERENTIAL (CANCER CENTER ONLY)
Abs Immature Granulocytes: 0 K/uL (ref 0.00–0.07)
Basophils Absolute: 0 K/uL (ref 0.0–0.1)
Basophils Relative: 2 %
Eosinophils Absolute: 0 K/uL (ref 0.0–0.5)
Eosinophils Relative: 2 %
HCT: 23.1 % — ABNORMAL LOW (ref 36.0–46.0)
Hemoglobin: 7.6 g/dL — ABNORMAL LOW (ref 12.0–15.0)
Immature Granulocytes: 0 %
Lymphocytes Relative: 83 %
Lymphs Abs: 0.6 K/uL — ABNORMAL LOW (ref 0.7–4.0)
MCH: 29.9 pg (ref 26.0–34.0)
MCHC: 32.9 g/dL (ref 30.0–36.0)
MCV: 90.9 fL (ref 80.0–100.0)
Monocytes Absolute: 0 K/uL — ABNORMAL LOW (ref 0.1–1.0)
Monocytes Relative: 3 %
Neutro Abs: 0.1 K/uL — CL (ref 1.7–7.7)
Neutrophils Relative %: 10 %
Platelet Count: 94 K/uL — ABNORMAL LOW (ref 150–400)
RBC: 2.54 MIL/uL — ABNORMAL LOW (ref 3.87–5.11)
RDW: 14.3 % (ref 11.5–15.5)
Smear Review: NORMAL
WBC Count: 0.7 K/uL — CL (ref 4.0–10.5)
nRBC: 0 % (ref 0.0–0.2)

## 2024-01-21 LAB — CMP (CANCER CENTER ONLY)
ALT: 74 U/L — ABNORMAL HIGH (ref 0–44)
AST: 41 U/L (ref 15–41)
Albumin: 3.5 g/dL (ref 3.5–5.0)
Alkaline Phosphatase: 120 U/L (ref 38–126)
Anion gap: 11 (ref 5–15)
BUN: 17 mg/dL (ref 8–23)
CO2: 28 mmol/L (ref 22–32)
Calcium: 9.4 mg/dL (ref 8.9–10.3)
Chloride: 91 mmol/L — ABNORMAL LOW (ref 98–111)
Creatinine: 0.83 mg/dL (ref 0.44–1.00)
GFR, Estimated: >60 mL/min (ref >60)
Glucose, Bld: 138 mg/dL — ABNORMAL HIGH (ref 70–99)
Potassium: 4.3 mmol/L (ref 3.5–5.1)
Sodium: 130 mmol/L — ABNORMAL LOW (ref 135–145)
Total Bilirubin: 0.4 mg/dL (ref 0.0–1.2)
Total Protein: 6.5 g/dL (ref 6.5–8.1)

## 2024-01-21 LAB — RAD ONC ARIA SESSION SUMMARY
Course Elapsed Days: 8
Plan Fractions Treated to Date: 4
Plan Prescribed Dose Per Fraction: 2 Gy
Plan Total Fractions Prescribed: 8
Plan Total Prescribed Dose: 16 Gy
Reference Point Dosage Given to Date: 8 Gy
Reference Point Session Dosage Given: 2 Gy
Session Number: 6

## 2024-01-21 LAB — MAGNESIUM: Magnesium: 1.6 mg/dL — ABNORMAL LOW (ref 1.7–2.4)

## 2024-01-21 MED ORDER — ACETAMINOPHEN 325 MG PO TABS
650.0000 mg | ORAL_TABLET | Freq: Once | ORAL | Status: AC
  Administered 2024-01-21: 650 mg via ORAL
  Filled 2024-01-21: qty 2

## 2024-01-21 MED ORDER — SODIUM CHLORIDE 0.9% IV SOLUTION
250.0000 mL | INTRAVENOUS | Status: DC
  Administered 2024-01-21: 250 mL via INTRAVENOUS

## 2024-01-21 MED ORDER — MAGNESIUM OXIDE -MG SUPPLEMENT 400 (240 MG) MG PO TABS: 400.0000 mg | ORAL_TABLET | Freq: Every day | ORAL | 5 refills | Status: AC

## 2024-01-21 MED ORDER — DIPHENHYDRAMINE HCL 25 MG PO CAPS
25.0000 mg | ORAL_CAPSULE | Freq: Once | ORAL | Status: AC
  Administered 2024-01-21: 25 mg via ORAL
  Filled 2024-01-21: qty 1

## 2024-01-21 NOTE — Telephone Encounter (Signed)
 CRITICAL VALUE STICKER  CRITICAL VALUE:  WBC  0.7 ; ANC 0.4  RECEIVER (on-site recipient of call): Lanise Mergen,RN  DATE & TIME NOTIFIED: 01/21/2024 @ 1306  MESSENGER (representative from lab): Luke Shine, lab  MD NOTIFIED:  Dr Cornelius  TIME OF NOTIFICATION: 01/21/2024 @ 1310  RESPONSE:  This is pt nadir count, has appt with Cornelius @ 3p.

## 2024-01-21 NOTE — Patient Instructions (Signed)

## 2024-01-21 NOTE — Progress Notes (Signed)
 [facility]  [address] [place],  [place]  [address] [phone]  Clinic Day:  01/21/2024  Referring physician: [name], MD   ASSESSMENT & PLAN:  Assessment:  Small Cell Lung Cancer This is newly diagnosed but found to be stage IV with liver metastases and significant bulky mediastinal adenopathy, right pleural based mass, and small right pleural effusion. She has been started on chemotherapy with Carboplatin , Etoposide , and Atezolizumab , with her first cycle given 10 days ago. She has had a rough time, but may be over the worst. I reviewed all the plans with the patient her daughter and we plan close follow-up and supportive care.   Superior Vena Cava Syndrome Due to this, palliative radiation was added and is helping. She had increased pain in the upper mediastinum earlier this week, but that has improved and she is tapering off the oxycodone. The edema of her arms has resolved and neck and face are greatly decreased.  Hyponatremia This is likely caused by SIADH from her small cell lung cancer and will be monitored closely. Her sodium is finally coming up now.  Hypomagnesemia She has received IV magnesium . Her level is still mildly low at 1.6, so I will place her on oral supplement of magnesium  oxide 400 mg daily.   Pneumonia This is multifocal and at least partially related to post obstruction of the right lower main bronchus.   Anemia This is likely related to her new small cell lung cancer and bilateral pneumonia. Her vitamin levels were all normal. Her hemoglobin dropped further to 7.6 on 01/19/2024 and remained that today. She was given 1 unit of PRBCs today.  Neutropenia She has profound neutropenia with an ANC down to 100, but this is her nadir and she did not receive Neulasta . I have reviewed neutropenic precautions with her and her daughter, and her counts should recover next week. We will recheck them on November 18th.   Thrombocytopenia Her  platelet count is not too low, but has gone below 100,000 and she has had some nosebleeds. We will continue to monitor.  History of Stage IA (T1b N0 M0) hormone receptor positive right breast cancer This was diagnosed in April, 2005. She was treated with a lumpectomy and pathology revealed a 6mm, grade 3, invasive ductal carcinoma with sentinel node positive with a 3mm micrometastasis. Estrogen and progesterone receptors were positive and her 2 neu negative. She was treated with 4 cycles of doxorubicin and cyclophosphamide. She was then placed on Anastrozole in November, 2005 and took that for 3.109yrs, but was switched to Tamoxifen in April, 2009 because of her arthralgias related to Anastrozole. She completed 5 years of adjuvant hormonal therapy.    History of Stage IA (T1b N0 M0) hormone receptor positive left breast cancer This was diagnosed in July, 2015. This was treated with lumpectomy and pathology revealed a 6mm, grade 1, ductal carcinoma with negative sentinel node. Estrogen and progesterone receptors were positive and her 2 neu negative and Ki 67 was 18%. She received adjuvant radiation to the left breast and was then placed on Anastrozole again in August, 2015. This was stopped in October, 2016 due to severe hot flashes, anxiety, and depression, mild cognitive difficulty, trouble finding words, and remembering things as well as becoming easily overwhelmed. She did not tolerate venlafaxine and was placed on paroxetine 20mg  daily in September, 2015 and this improved her symptoms. She was later changed to Lexapro. She was placed on Tamoxifen 20 mg daily for adjuvant  hormonal therapy for 5 years.   Plan: She feels well, but complains of dyspnea, especially with any exertion. She is wearing a nasal cannula in the office today. She states that her pain has improved with oxycodone. I recommended that she take it at bedtime to better control nighttime pain. She received 1 unit of PRBCs and radiation today  and will continue daily radiation for another week. She has a critically low WBC of 0.7 with an ANC of 100 down from 2.2 with an ANC of 1700, a stable low hemoglobin of 7.6, and a low platelet count of 94,000 down from 116,000. Her CMP reveals a low sodium of 130, up from 125, and an elevated ALT of 74, up from 98. Her magnesium  is low at 1.6, down from 1.9. She was not able to receive the Neulasta  injection with the first cycle. I will prescribe oral 400 mg magnesium  oxide once daily and discussed the side effects including diarrhea. I advised her to stay away from large crowds and to limit visitors. I instructed her to monitor for symptoms of infection such as cough with yellow-green mucus, fever, or chills. She currently takes 40 mg furosemide daily without difficulty. I advised her to elevate her legs often and to remain on her current furosemide dose. I instructed her to take an extra half dose if her swelling worsens. She is scheduled for day 1, cycle 2 of Carboplatin  + Etoposide  + Atezolizumab  on 01/31/2024. We will see her back next week with CBC, CMP, and magnesium . I discussed the assessment and treatment plan with the patient.  The patient was provided an opportunity to ask questions and all were answered.  The patient agreed with the plan and demonstrated an understanding of the instructions.  The patient was advised to call back if the symptoms worsen or if the condition fails to improve as anticipated.  I provided 17 minutes of face-to-face time during this this encounter and > 50% was spent counseling as documented under my assessment and plan.   Heather VEAR Cornish, MD [facility] - A DEPT OF [facility] [address] [place] [address] Dept: [phone] Dept Fax: [phone]   No orders of the defined types were placed in this encounter.  CHIEF COMPLAINT:  CC: Small Cell Lung Cancer  Current Treatment:  Chemotherapy and  Immunotherapy   HISTORY OF PRESENT ILLNESS:  Heather Chavez is a 78 y.o. female with a history of bilateral breast cancer who is referred in consultation by Dr. [name] for assessment and management of newly diagnosed small cell lung cancer. She has a history of stage 1A right breast cancer 06/2003 (s/p lumpectomy, doxorubicin + cyclophosphamide, and 5 years of adjuvant hormonal therapy with tamoxifen/anastrozole), stage 1A left breast cancer (s/p lumpectomy, adjuvant radiation, and 5 years of adjuvant hormonal therapy with tamoxifen/anastrozole), COPD/asthma overlap, and tobacco use.  She underwent testing for hereditary breast and ovarian cancer with the [employer] OvaNext 25 gene panel, which did not reveal any clinically significant mutation. She did have a variant of uncertain significance of MSH2. She had a previous hysterectomy and unilateral oophorectomy for benign disease at a fairly young age, so does not require pap smears. Screening colonoscopy in February, 2017 revealed multiple polyps, all of which were benign polypoid colonic mucosa.  Mela was admitted into the [facility] ED on October 20th, 2025 and presented with shortness of breath, fevers, and chills. She received a chest x-ray  for further evaluation which revealed an right upper lobe infiltrate versus mass and chest CT angio revealed emphysema with chronic interstitial changes in both lung fields corresponding with multifocal pneumonitis and a suspicious pleural based mass in the right upper lobe measuring 3.9cm, and multiple suspicious enlarged mediastinal lymph nodes.  She was diagnosed with mass of the right lung, acute hypoxic respiratory failure, and bilateral community acquired pneumonia before being transferred to [facility]. She then had bronchoscopy and biopsy and pathology report was positive for small cell carcinoma with positive tumor cells for TTF-1 and neuroendocrine markers CD56, synaptophysin,  and chromogranin. GATA-3 is negative and Pan keratin showed dot-like positive staining. She comes to me for discussion of staging and treatment and had a PET scan and brain MRI today but results are pending. I recommend systemic chemotherapy with Carboplatin  and Etopiside. She will also have immunotherapy, either concurrent or sequential depending on her staging. She will have her chemoeducation tomorrow and port placed this Friday.   I have reviewed her chart and materials related to her cancer extensively and collaborated history with the patient. Summary of oncologic history is as follows: Oncology History  Lung cancer, lower lobe (HCC)  01/05/2024 Initial Diagnosis   Lung cancer, lower lobe (HCC)   01/06/2024 Cancer Staging   Staging form: Lung, AJCC V9 - Clinical stage from 01/06/2024: Stage IVB (cT4, cN2b, cM1c1) - Signed by Cornelius Heather DEL, MD on 01/06/2024 Histopathologic type: Small cell carcinoma, NOS Stage prefix: Initial diagnosis Method of lymph node assessment: Clinical IASLC grade: G3 Histologic grading system: 3 grade system Laterality: Right Tumor size (mm): 56 Sites of metastasis: Liver Lymph-vascular invasion (LVI): Presence of LVI unknown/indeterminate Diagnostic confirmation: Positive histology Specimen type: Bronchial Biopsy Staged by: Managing physician Standard uptake value (SUV): 12.5 Perineural invasion (PNI): Unknown Pleural/elastic layer invasion: Unknown Stage used in treatment planning: Yes National guidelines used in treatment planning: Yes Type of national guideline used in treatment planning: NCCN   01/10/2024 -  Chemotherapy   Patient is on Treatment Plan : LUNG SCLC Carboplatin  + Etoposide  + Atezolizumab  Induction q21d x 4 cycles / Atezolizumab  Maintenance q21d      INTERVAL HISTORY:  Heather Chavez is seen in the clinic for follow up of her newly diagnosed small cell carcinoma. Patient states that she feels well, but complains of dyspnea,  especially with any exertion. She is wearing a nasal cannula in the office today. She states that her pain has improved with oxycodone. I recommended that she take it at bedtime to better control nighttime pain. She received 1 unit of PRBCs and radiation today and will continue daily radiation for another week. She has a critically low WBC of 0.7 with an ANC of 100 down from 2.2 with an ANC of 1700, a stable low hemoglobin of 7.6, and a low platelet count of 94,000 down from 116,000. Her CMP reveals a low sodium of 130, up from 125, and an elevated ALT of 74, up from 98. Her magnesium  is low at 1.6, down form 1.9. She was not able to receive the Neulasta  injection with the first cycle. I will prescribe oral 400 mg magnesium  oxide once daily and discussed the side effects including diarrhea. I advised her to stay away from large crowds and to limit visitors. I instructed her to monitor for symptoms of infection such as cough with yellow-green mucus, fever, and chills. She currently takes 40 mg furosemide daily without difficulty. I advised her to elevate her legs often and to  remain on her current furosemide dose. I instructed her to take an extra half dose if her swelling worsens. She is scheduled for day 1, cycle 2 of Carboplatin  + Etoposide  + Atezolizumab  on 01/31/2024. We will see her back next week with CBC, CMP, and magnesium .  She denies fever, chills, night sweats, or other signs of infection. She denies cardiorespiratory and gastrointestinal issues. She  denies pain. Her appetite is okay and Her weight has increased 3.5 pounds over last 4 weeks. She is accompanied by her daughter.  HISTORY:   Past Medical History:  Diagnosis Date   Advanced nonexudative age-related macular degeneration of right eye with subfoveal involvement 09/21/2019   Advanced nonexudative age-related macular degeneration of right eye without subfoveal involvement 09/21/2019   Allergic rhinitis    Angina pectoris 05/17/2020    Bilateral breast cancer (HCC) 11/23/2013   BMI 34.0-34.9,adult 09/25/2019   Breast cancer (HCC)    Bronchitis, chronic (HCC)    CAD (coronary artery disease)    Cancer (HCC)    Cardiac murmur 05/17/2020   Coronary artery calcification 05/17/2020   Degenerative retinal drusen of right eye 09/21/2019   Diabetes mellitus due to underlying condition with unspecified complications (HCC) 05/17/2020   Diverticulosis    Dry eyes 06/25/2016   Dyspnea    Emphysema/COPD    Essential hypertension 05/17/2020   Estrogen receptor positive neoplasm 09/24/2015   Family history of malignant neoplasm of breast 11/23/2013   Family history of peritoneal cancer 11/23/2013   Fuchs' corneal dystrophy 11/03/2016   GERD (gastroesophageal reflux disease)    History of colon polyps    History of right breast cancer 09/19/2018   Hypertension    IBS (irritable bowel syndrome)    with D   Intermediate stage nonexudative age-related macular degeneration of left eye 09/21/2019   Iritis 11/03/2016   Macular degeneration    Major depressive disorder    Malignant neoplasm of upper-inner quadrant of left breast in female, estrogen receptor positive (HCC) 09/01/2016   Mixed dyslipidemia 05/17/2020   Obesity    Obesity (BMI 35.0-39.9 without comorbidity) 05/17/2020   OSA on CPAP 10/29/2020   Diagnosed some 3 months prior after pneumonia episode, Dr. Shellia pulmonology   Peripheral vascular disease    Personal history of chemotherapy    Personal history of radiation therapy    Pseudophakia 09/21/2019   Right-sided chest wall pain    SVT (supraventricular tachycardia) 05/23/2020   Varicose veins of left lower extremity with complications 08/11/2016   Past Surgical History:  Procedure Laterality Date   BREAST BIOPSY     BREAST LUMPECTOMY     right brwast 2005/ left 2015   CATARACT EXTRACTION     CHOLECYSTECTOMY     COLONOSCOPY  04/17/2015   Colonic polyps status post polypectomy. Moderate to severe sigmoid  diverticulosis   ENDOVENOUS ABLATION SAPHENOUS VEIN W/ LASER Right 02/19/2023   endovenous laser ablation right greater saphenous vein and stab phlebectomy 10-20 incisions right leg by Gaile New MD   TUBAL LIGATION     VAGINAL HYSTERECTOMY     VEIN LIGATION AND STRIPPING     Family History  Problem Relation Age of Onset   Cancer Mother 76       bilateral breast cancer ages 63 then 9. Peritoneal cancer at age 42.   Other Mother        Ms. Basto's mother was adopted so have no information regarding maternal family  history other than her mother   Breast cancer  Mother    Cancer Father 30       renal   Cancer Sister 76       breast   Breast cancer Sister    Breast cancer Daughter    Breast cancer Maternal Aunt    Breast cancer Paternal Aunt    Breast cancer Paternal Grandmother    Cancer Paternal Grandmother        bilateral breast cancer at unknown age   Social History:  reports that she quit smoking about 42 years ago. Her smoking use included cigarettes. She started smoking about 57 years ago. She has a 37.5 pack-year smoking history. She has never used smokeless tobacco. She reports current alcohol use. She reports that she does not use drugs.The patient is accompanied by her daughter and granddaughter today.  Allergies:  Allergies  Allergen Reactions   Codeine Nausea And Vomiting and Other (See Comments)    Other reaction(s): Other (see comments), Vomiting   Levofloxacin Other (See Comments)    Leg cramps   Nexlizet  [Bempedoic Acid-Ezetimibe ] Other (See Comments)   Pitavastatin  Other (See Comments)    Leg cramps  Other Reaction(s): Other (See Comments)  Leg cramps    Leg cramps   Rosuvastatin  Other (See Comments)    Muscle pain    Current Medications: Current Outpatient Medications  Medication Sig Dispense Refill   albuterol  (VENTOLIN  HFA) 108 (90 Base) MCG/ACT inhaler Inhale 2 puffs into the lungs every 4 (four) hours as needed for wheezing.     allopurinol   (ZYLOPRIM ) 300 MG tablet Take 1 tablet (300 mg total) by mouth daily. 30 tablet 0   Biotin 89999 MCG TABS Take 10,000 mcg by mouth daily.     Boswellia-Glucosamine-Vit D (OSTEO BI-FLEX ONE PER DAY) TABS Take by mouth. (Patient not taking: Reported on 02/02/2024)     budesonide -formoterol  (SYMBICORT ) 80-4.5 MCG/ACT inhaler Inhale 2 puffs into the lungs 2 (two) times daily.     chlorpheniramine-HYDROcodone (TUSSIONEX) 10-8 MG/5ML Take 5 mLs by mouth every 12 (twelve) hours as needed for cough. 473 mL 0   dextromethorphan-guaiFENesin (ROBITUSSIN-DM) 10-100 MG/5ML liquid Take 5 mLs by mouth every 6 (six) hours as needed for cough. (Patient not taking: Reported on 02/02/2024)     diltiazem  (CARDIZEM  CD) 120 MG 24 hr capsule Take 120 mg by mouth daily.     fexofenadine (ALLEGRA) 180 MG tablet Take 180 mg by mouth.     fluocinonide cream (LIDEX) 0.05 % Apply 1 Application topically 3 (three) times daily. (Patient not taking: Reported on 02/02/2024)     FLUoxetine (PROZAC) 20 MG capsule Take 20 mg by mouth daily.     furosemide (LASIX) 40 MG tablet Take 40 mg by mouth.     gabapentin (NEURONTIN) 600 MG tablet Take 600 mg by mouth 2 (two) times daily.     GARLIC PO Take 1 tablet by mouth daily.     HYDROcodone-acetaminophen  (NORCO/VICODIN) 5-325 MG tablet Take 1 tablet by mouth every 6 (six) hours as needed for moderate pain (pain score 4-6). (Patient not taking: Reported on 02/02/2024)     ipratropium-albuterol  (DUONEB) 0.5-2.5 (3) MG/3ML SOLN Take 3 mLs by nebulization every 4 (four) hours as needed (breathing treatment).     LORazepam  (ATIVAN ) 0.5 MG tablet Take by mouth.     magic mouthwash w/lidocaine  SOLN Take 5 mLs by mouth 4 (four) times daily. Suspension contains equal amounts of Maalox Extra Strength, nystatin , diphenhydramine  and lidocaine .     magnesium  oxide (MAG-OX) 400 (240 Mg)  MG tablet Take 1 tablet (400 mg total) by mouth daily. 30 tablet 5   metFORMIN (GLUCOPHAGE) 500 MG tablet Take 500  mg by mouth daily.     metoprolol  tartrate (LOPRESSOR ) 100 MG tablet Take 150 mg by mouth 2 (two) times daily.     montelukast (SINGULAIR) 10 MG tablet Take 10 mg by mouth at bedtime.     Multiple Vitamins-Minerals (PRESERVISION AREDS 2 PO) Take by mouth.     nitrofurantoin (MACRODANTIN) 100 MG capsule Take 100 mg by mouth 2 (two) times daily.     nitroGLYCERIN  (NITROSTAT ) 0.4 MG SL tablet Place 0.4 mg under the tongue every 5 (five) minutes as needed for chest pain.     nystatin  (MYCOSTATIN /NYSTOP ) powder Apply 1 Application topically 3 (three) times daily. 15 g 0   ondansetron  (ZOFRAN ) 4 MG tablet Take 1 tablet (4 mg total) by mouth every 4 (four) hours as needed for nausea. 90 tablet 3   ondansetron  (ZOFRAN -ODT) 4 MG disintegrating tablet Take 4 mg by mouth. (Patient not taking: Reported on 02/02/2024)     OXYGEN  Inhale 2.5 L/min into the lungs continuous.     pantoprazole  (PROTONIX ) 40 MG tablet Take 1 tablet (40 mg total) by mouth daily. Call [phone] to schedule an office visit for more refills (Patient taking differently: Take 40 mg by mouth 2 (two) times daily. Call [phone] to schedule an office visit for more refills) 90 tablet 0   potassium chloride  SA (KLOR-CON  M) 20 MEQ tablet Take 1 tablet (20 mEq total) by mouth daily. 30 tablet 5   prochlorperazine  (COMPAZINE ) 10 MG tablet Take 1 tablet (10 mg total) by mouth every 6 (six) hours as needed for nausea or vomiting. 90 tablet 3   vitamin E 180 MG (400 UNITS) capsule Take 400 Units by mouth daily.     zinc gluconate 50 MG tablet Take 50 mg by mouth daily.     No current facility-administered medications for this visit.    REVIEW OF SYSTEMS:  Review of Systems  Constitutional: Negative.  Negative for appetite change, chills, diaphoresis, fatigue, fever and unexpected weight change.  HENT:   Positive for nosebleeds. Negative for hearing loss, lump/mass, mouth sores, sore throat, tinnitus, trouble swallowing and voice change.    Eyes:  Positive for eye problems (vision changes).  Respiratory:  Positive for cough and shortness of breath. Negative for chest tightness, hemoptysis and wheezing.   Cardiovascular:  Positive for leg swelling. Negative for chest pain and palpitations.  Gastrointestinal:  Negative for abdominal distention, abdominal pain, blood in stool, constipation, diarrhea, nausea, rectal pain and vomiting.       Bowel incontinence upon straining  Endocrine: Negative.   Genitourinary:  Positive for bladder incontinence (upon straining). Negative for difficulty urinating, dyspareunia, dysuria, frequency, hematuria, menstrual problem, nocturia, pelvic pain, vaginal bleeding and vaginal discharge.   Musculoskeletal: Negative.  Negative for arthralgias, back pain, flank pain, gait problem, myalgias, neck pain and neck stiffness.  Skin: Negative.  Negative for itching, rash and wound.  Neurological:  Positive for headaches and numbness (neuropathy of bilateral feet). Negative for dizziness, extremity weakness, gait problem, light-headedness, seizures and speech difficulty.  Hematological:  Negative for adenopathy. Bruises/bleeds easily.  Psychiatric/Behavioral: Negative.  Negative for depression and sleep disturbance. The patient is not nervous/anxious.    VITALS:  Blood pressure 135/89, pulse 97, temperature 98.6 F (37 C), temperature source Oral, resp. rate 18, height 5' 5 (1.651 m), weight 215 lb 8 oz (97.8 kg), SpO2 100%.  Wt Readings from Last 3 Encounters:  02/02/24 205 lb (93 kg)  01/31/24 208 lb 4.8 oz (94.5 kg)  01/21/24 215 lb 8 oz (97.8 kg)    Body mass index is 35.86 kg/m.  Performance status (ECOG): 1 - Symptomatic but completely ambulatory  PHYSICAL EXAM:  Physical Exam Vitals and nursing note reviewed. Exam conducted with a chaperone present.  Constitutional:      General: She is not in acute distress.    Appearance: Normal appearance. She is normal weight. She is not ill-appearing,  toxic-appearing or diaphoretic.  HENT:     Head: Normocephalic and atraumatic.     Right Ear: Tympanic membrane, ear canal and external ear normal. There is no impacted cerumen.     Left Ear: Tympanic membrane, ear canal and external ear normal. There is no impacted cerumen.     Nose: Nose normal. No congestion or rhinorrhea.     Mouth/Throat:     Mouth: Mucous membranes are moist.     Pharynx: Oropharynx is clear. No oropharyngeal exudate or posterior oropharyngeal erythema.  Eyes:     General: No scleral icterus.       Right eye: No discharge.        Left eye: No discharge.     Extraocular Movements: Extraocular movements intact.     Conjunctiva/sclera: Conjunctivae normal.     Pupils: Pupils are equal, round, and reactive to light.  Cardiovascular:     Rate and Rhythm: Normal rate and regular rhythm.     Pulses: Normal pulses.     Heart sounds: Normal heart sounds. No murmur heard.    No friction rub. No gallop.  Pulmonary:     Effort: Pulmonary effort is normal. No respiratory distress.     Breath sounds: Examination of the right-lower field reveals decreased breath sounds. Decreased breath sounds present.  Abdominal:     General: Bowel sounds are normal. There is no distension.     Palpations: Abdomen is soft. There is hepatomegaly (mild). There is no splenomegaly or mass.     Tenderness: There is no abdominal tenderness. There is no right CVA tenderness, left CVA tenderness, guarding or rebound.     Hernia: No hernia is present.  Musculoskeletal:        General: Normal range of motion.     Cervical back: Normal range of motion and neck supple.     Right lower leg: Edema (1-2+) present.     Left lower leg: Edema (1-2+) present.     Comments: Bilateral lower leg 1-2+ edema  Lymphadenopathy:     Cervical: No cervical adenopathy.     Right cervical: No superficial, deep or posterior cervical adenopathy.    Left cervical: No superficial, deep or posterior cervical adenopathy.      Upper Body:     Right upper body: No supraclavicular, axillary or pectoral adenopathy.     Left upper body: No supraclavicular, axillary or pectoral adenopathy.  Skin:    General: Skin is warm and dry.  Neurological:     General: No focal deficit present.     Mental Status: She is alert and oriented to person, place, and time. Mental status is at baseline.  Psychiatric:        Mood and Affect: Mood normal.        Behavior: Behavior normal.        Thought Content: Thought content normal.        Judgment: Judgment normal.    LABS:  Latest Ref Rng & Units 01/31/2024   12:49 PM 01/21/2024   11:14 AM 01/19/2024   11:06 AM  CBC  WBC 4.0 - 10.5 K/uL 8.4  0.7  2.2   Hemoglobin 12.0 - 15.0 g/dL 9.9  7.6  7.6   Hematocrit 36.0 - 46.0 % 30.9  23.1  22.3   Platelets 150 - 400 K/uL 260  94  116       Latest Ref Rng & Units 01/31/2024   12:49 PM 01/21/2024   11:14 AM 01/19/2024   11:06 AM  CMP  Glucose 70 - 99 mg/dL 893  861  849   BUN 8 - 23 mg/dL 14  17  14    Creatinine 0.44 - 1.00 mg/dL 9.25  9.16  9.20   Sodium 135 - 145 mmol/L 134  130  125   Potassium 3.5 - 5.1 mmol/L 4.1  4.3  4.5   Chloride 98 - 111 mmol/L 94  91  88   CO2 22 - 32 mmol/L 30  28  27    Calcium  8.9 - 10.3 mg/dL 9.4  9.4  9.1   Total Protein 6.5 - 8.1 g/dL 6.7  6.5  6.3   Total Bilirubin 0.0 - 1.2 mg/dL 0.3  0.4  0.5   Alkaline Phos 38 - 126 U/L 146  120  128   AST 15 - 41 U/L 30  41  87   ALT 0 - 44 U/L 30  74  98      Lab Results  Component Value Date   CEA 11.87 (H) 01/06/2024   /  CEA ([facility])  Date Value Ref Range Status  01/06/2024 11.87 (H) 0.00 - 5.00 ng/mL Final    Comment:    (NOTE) This test was performed using Beckman Coulter's paramagnetic chemiluminescent immunoassay. Values obtained from different assay methods cannot be used interchangeably. Please note that up to 8% of patients who smoke may see values 5.1-10.0 ng/ml and 1% of patients who smoke may see CEA levels >10.0  ng/ml. Performed at [facility], [address] [place], [place] [address]    No results found for: PSA1 No results found for: CAN199 No results found for: CAN125  No results found for: STEPHANY RINGS, A1GS, A2GS, BETS, BETA2SER, GAMS, MSPIKE, SPEI Lab Results  Component Value Date   TIBC 316 01/10/2024   FERRITIN 297 01/10/2024   IRONPCTSAT 27 01/10/2024   No results found for: LDH  STUDIES:  EXAM: 01/05/2024 MRI HEAD WITH WITHOUT CM IMPRESSION: Relatively age-appropriate atrophy and mall-vessel disease  EXAM: 01/05/2024 PET SKULL BASE TO MID-THIGH IMPRESSION: Irregular solid noncalcified peripheral right upper lobe mass likely primary neoplasm. Extensive mediastinal and right hilar metastatic adenopathy. Extensive hepatic metastatic disease. Probable single metastatic lesion in the left proximal femur. No definitive adrenal metastatic disease.   Exam: 12/29/2023 Pathology Impression: Lung, right upper lobe, needle core biopsy: Small Cell Carcinoma The tumor cells are positive for TTF-1 and neuroendocrine markers CD56, synaptophysin, and chromogranin. GATA-3 is negative, Pan keratin shows dot-like positive staining.   I,Kirkland Figg H Isola Mehlman,acting as a scribe for Heather VEAR Cornish, MD.,have documented all relevant documentation on the behalf of Heather VEAR Cornish, MD,as directed by  Heather VEAR Cornish, MD while in the presence of Heather VEAR Cornish, MD.  I have reviewed this report as typed by the medical scribe, and it is complete and accurate.

## 2024-01-24 ENCOUNTER — Ambulatory Visit

## 2024-01-24 ENCOUNTER — Telehealth: Payer: Self-pay

## 2024-01-24 DIAGNOSIS — K219 Gastro-esophageal reflux disease without esophagitis: Secondary | ICD-10-CM | POA: Diagnosis present

## 2024-01-24 DIAGNOSIS — C787 Secondary malignant neoplasm of liver and intrahepatic bile duct: Secondary | ICD-10-CM | POA: Diagnosis present

## 2024-01-24 DIAGNOSIS — J8410 Pulmonary fibrosis, unspecified: Secondary | ICD-10-CM | POA: Diagnosis present

## 2024-01-24 DIAGNOSIS — D61810 Antineoplastic chemotherapy induced pancytopenia: Secondary | ICD-10-CM | POA: Diagnosis present

## 2024-01-24 DIAGNOSIS — C3431 Malignant neoplasm of lower lobe, right bronchus or lung: Secondary | ICD-10-CM | POA: Diagnosis not present

## 2024-01-24 DIAGNOSIS — Z1624 Resistance to multiple antibiotics: Secondary | ICD-10-CM | POA: Diagnosis present

## 2024-01-24 DIAGNOSIS — R21 Rash and other nonspecific skin eruption: Secondary | ICD-10-CM | POA: Diagnosis present

## 2024-01-24 DIAGNOSIS — N183 Chronic kidney disease, stage 3 unspecified: Secondary | ICD-10-CM | POA: Diagnosis present

## 2024-01-24 DIAGNOSIS — F32A Depression, unspecified: Secondary | ICD-10-CM | POA: Diagnosis present

## 2024-01-24 DIAGNOSIS — E1142 Type 2 diabetes mellitus with diabetic polyneuropathy: Secondary | ICD-10-CM | POA: Diagnosis present

## 2024-01-24 DIAGNOSIS — D61818 Other pancytopenia: Secondary | ICD-10-CM | POA: Diagnosis not present

## 2024-01-24 DIAGNOSIS — D6481 Anemia due to antineoplastic chemotherapy: Secondary | ICD-10-CM | POA: Diagnosis present

## 2024-01-24 DIAGNOSIS — R0600 Dyspnea, unspecified: Secondary | ICD-10-CM | POA: Diagnosis not present

## 2024-01-24 DIAGNOSIS — F419 Anxiety disorder, unspecified: Secondary | ICD-10-CM | POA: Diagnosis present

## 2024-01-24 DIAGNOSIS — T451X5A Adverse effect of antineoplastic and immunosuppressive drugs, initial encounter: Secondary | ICD-10-CM | POA: Diagnosis present

## 2024-01-24 DIAGNOSIS — J9611 Chronic respiratory failure with hypoxia: Secondary | ICD-10-CM | POA: Diagnosis present

## 2024-01-24 DIAGNOSIS — I4891 Unspecified atrial fibrillation: Secondary | ICD-10-CM | POA: Diagnosis present

## 2024-01-24 DIAGNOSIS — B9620 Unspecified Escherichia coli [E. coli] as the cause of diseases classified elsewhere: Secondary | ICD-10-CM | POA: Diagnosis present

## 2024-01-24 DIAGNOSIS — N390 Urinary tract infection, site not specified: Secondary | ICD-10-CM | POA: Diagnosis present

## 2024-01-24 DIAGNOSIS — C3411 Malignant neoplasm of upper lobe, right bronchus or lung: Secondary | ICD-10-CM | POA: Diagnosis present

## 2024-01-24 DIAGNOSIS — D509 Iron deficiency anemia, unspecified: Secondary | ICD-10-CM | POA: Diagnosis present

## 2024-01-24 DIAGNOSIS — E871 Hypo-osmolality and hyponatremia: Secondary | ICD-10-CM | POA: Diagnosis not present

## 2024-01-24 DIAGNOSIS — E222 Syndrome of inappropriate secretion of antidiuretic hormone: Secondary | ICD-10-CM | POA: Diagnosis present

## 2024-01-24 DIAGNOSIS — R0902 Hypoxemia: Secondary | ICD-10-CM | POA: Diagnosis not present

## 2024-01-24 DIAGNOSIS — I871 Compression of vein: Secondary | ICD-10-CM | POA: Diagnosis present

## 2024-01-24 DIAGNOSIS — J441 Chronic obstructive pulmonary disease with (acute) exacerbation: Secondary | ICD-10-CM | POA: Diagnosis present

## 2024-01-24 DIAGNOSIS — E1122 Type 2 diabetes mellitus with diabetic chronic kidney disease: Secondary | ICD-10-CM | POA: Diagnosis present

## 2024-01-24 DIAGNOSIS — K921 Melena: Secondary | ICD-10-CM | POA: Diagnosis present

## 2024-01-24 DIAGNOSIS — I130 Hypertensive heart and chronic kidney disease with heart failure and stage 1 through stage 4 chronic kidney disease, or unspecified chronic kidney disease: Secondary | ICD-10-CM | POA: Diagnosis present

## 2024-01-24 DIAGNOSIS — D649 Anemia, unspecified: Secondary | ICD-10-CM | POA: Diagnosis not present

## 2024-01-24 DIAGNOSIS — Z4682 Encounter for fitting and adjustment of non-vascular catheter: Secondary | ICD-10-CM | POA: Diagnosis not present

## 2024-01-24 DIAGNOSIS — I5032 Chronic diastolic (congestive) heart failure: Secondary | ICD-10-CM | POA: Diagnosis present

## 2024-01-24 LAB — TYPE AND SCREEN
ABO/RH(D): A POS
Antibody Screen: NEGATIVE
Unit division: 0

## 2024-01-24 LAB — BPAM RBC
Blood Product Expiration Date: 202512052359
ISSUE DATE / TIME: 202511141021
Unit Type and Rh: 6200

## 2024-01-25 ENCOUNTER — Ambulatory Visit

## 2024-01-25 ENCOUNTER — Inpatient Hospital Stay: Admitting: Hematology and Oncology

## 2024-01-25 ENCOUNTER — Encounter: Payer: Self-pay | Admitting: Oncology

## 2024-01-25 ENCOUNTER — Inpatient Hospital Stay

## 2024-01-25 ENCOUNTER — Ambulatory Visit: Admitting: Radiation Oncology

## 2024-01-25 DIAGNOSIS — C3431 Malignant neoplasm of lower lobe, right bronchus or lung: Secondary | ICD-10-CM | POA: Diagnosis not present

## 2024-01-25 DIAGNOSIS — I871 Compression of vein: Secondary | ICD-10-CM | POA: Diagnosis not present

## 2024-01-25 DIAGNOSIS — E871 Hypo-osmolality and hyponatremia: Secondary | ICD-10-CM | POA: Diagnosis not present

## 2024-01-26 ENCOUNTER — Ambulatory Visit

## 2024-01-26 DIAGNOSIS — C3431 Malignant neoplasm of lower lobe, right bronchus or lung: Secondary | ICD-10-CM | POA: Diagnosis not present

## 2024-01-26 DIAGNOSIS — E871 Hypo-osmolality and hyponatremia: Secondary | ICD-10-CM | POA: Diagnosis not present

## 2024-01-26 DIAGNOSIS — I871 Compression of vein: Secondary | ICD-10-CM | POA: Diagnosis not present

## 2024-01-27 ENCOUNTER — Ambulatory Visit

## 2024-01-27 ENCOUNTER — Encounter: Payer: Self-pay | Admitting: Oncology

## 2024-01-27 DIAGNOSIS — I871 Compression of vein: Secondary | ICD-10-CM | POA: Diagnosis not present

## 2024-01-27 DIAGNOSIS — C3431 Malignant neoplasm of lower lobe, right bronchus or lung: Secondary | ICD-10-CM | POA: Diagnosis not present

## 2024-01-27 DIAGNOSIS — E871 Hypo-osmolality and hyponatremia: Secondary | ICD-10-CM | POA: Diagnosis not present

## 2024-01-28 ENCOUNTER — Ambulatory Visit

## 2024-01-28 ENCOUNTER — Telehealth: Payer: Self-pay

## 2024-01-28 NOTE — Patient Instructions (Signed)
 Visit Information  Thank you for taking time to visit with me today. Please don't hesitate to contact me if I can be of assistance to you before our next scheduled telephone appointment.  Our next appointment is by telephone on Monday November 24th   Following is a copy of your care plan:   Goals Addressed             This Visit's Progress    VBCI Transitions of Care (TOC) Care Plan       Problems:  Recent Hospitalization for treatment: New A-fib, Acute anemia/pancytopenia, Multi drug resistant E. Colu UTI   Patient reports feeling concern that cancer has spread and is awaiting PET scan, MRI, and appointment with Dr Wanda Cornish 01/05/24 for answers - patient completed and reports Stage IV with first chemo treatment today 01/10/24  Goal:  Over the next 30 days, the patient will not experience hospital readmission  Interventions:  Transitions of Care: Doctor Visits  - discussed the importance of doctor visits - Patient is scheduled to see PCP, Dr Ina today - plans to discuss the fluid in her arms  Discussed diabetes history and patient states she doesn't check her sugar - not a priority at this time - A1C 7.2 12/29/23 During medication review-TOC RN educated on importance of discussing nitroglycerin  with provider as she feels hers has expired Discussed new diagnosis with patient who states she is nervous about her appt with oncology tomorrow but is wanting answers - states she feels the cancer has likely already spread elsewhere and states she has had breast cancer x 2  Update 01/10/24: Patient was at her first chemo treatment when [facility] RN called. Patient states Dr Cornish told her she has stage  IV lung cancer that is not curable but is treatable and patient reports her chemo this week is today 01/10/24, Tu, Wed, and will return for labs and injection Friday 01/14/24. Patient states she wasn't surprised by the news and is just moving forward with treatment. Patient then said she had to  end the call as the nurse needs to draw her blood - TOC RN asked permission to follow up again with patient and patient was agreeable. TOC RN will try to reach her Thursday 01/13/24 - TOC RN was unable to review medications or assessments.  Update 01/13/24: Spoke with patient who was driving. Patient voicing feelings about diagnosis, chemo,swelling, and now having to have radiation. 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 Patient states the swelling is unbelievable and states every day it gets worse. Patient states she just is having troubling grasping and communicating to others exactly what's going on - states the minister was at her home today and feels she said more to him than anyone - states she talked to a granddaughter (who is a engineer, civil (consulting) and is her HCPOA) this morning. Patient states she told granddaughter she is willing to do whatever it takes and would like "to make it to Christmas". Patient states she has not given all details to her friends or her sister who is in [place]  and will return before Thanksgiving. Patient states she doesn't want her sister to cancel her time away. TOC RN expressed to patient that when we talk, we can talk about whatever she wants to discuss and it's okay for her to say anything  she is feeling. TOC RN discussed with patient that none of us  know our last day and hers could be due to the cancer or something else. Encouraged patient to think about her wishes and when she is ready to communicate her wishes and discuss Advanced Care Planning - Patient does have the HCPOA on record - TOC RN will discuss at a more appropriate time if patient would like to talk about DNR status vs Full Code. Patient states her children know she wants to be cremated. Patient then stated she is at the pickup line and is picking up her grandson  and is no longer able to talk - [facility] RN will continue to meet patient where she is and allow her to talk about what is important to her. Medications were not reviewed/patient was driving and focus was on allowing patient to voice her concerns. Not all assessments were completed. Will follow up with patient.    COPD Interventions: Advised patient to track and manage COPD triggers Assessed social determinant of health barriers Discussed the importance of adequate rest and management of fatigue with COPD Provided instruction about proper use of medications used for management of COPD including inhalers Use of home oxygen  new on 3 liters - Education provided on oxygen  safety including no smoking in the home - oxygen  in use sign on door - and notifying power company - patient asking about POC and was directed to discuss with provider as this requires an order   Oncology: Assessment of understanding of oncology diagnosis:  Reviewed upcoming provider appointments and treatment appointments Assessed available transportation to appointments and treatments. Has consistent/reliable transportation: Yes Assessed support system. Has consistent/reliable family or other support: Yes   AFIB Interventions:   Counseled on avoidance of NSAIDs due to increased bleeding risk with anticoagulants Counseled on importance of regular laboratory monitoring as prescribed Monitor heart rate and blood pressure at home and maintain a log Down the line there will be need to be another discussion in regards to starting anticoagulation therapy to reduce your risk of stroke   Patient Self Care Activities:  Attend all scheduled provider appointments Call pharmacy for medication refills 3-7 days in advance of running out of medications Call provider office for new concerns or questions  Notify RN Care Manager of TOC call rescheduling needs Participate in Transition of Care Program/Attend TOC scheduled calls Take medications  as prescribed   identify and remove indoor air pollutants limit outdoor activity during cold weather keep follow-up appointments: Pulmonary appointment scheduled with Dr Darlean and PFT 02/28/2024 Fluconazole  for the rash on your back UTI - Nitrofurantoin for four more days  Plan:  Telephone follow up appointment with care management team member scheduled for:  Monday November 24th The patient has been provided with contact information for the care management team and has been advised to call with any health related questions or concerns.         Patient verbalizes understanding of instructions and care plan provided today and agrees to view in MyChart. Active MyChart status and patient understanding of how to access instructions and care plan via MyChart confirmed with patient.     The patient has been provided with contact information for the care management team and has been advised to call with any health related questions or concerns.   Please call the care guide team at [phone] if you need to cancel or reschedule your appointment.   Please call the Suicide and Crisis Lifeline: 988 call the [place]   National Suicide Prevention Lifeline: (908) 875-5164 or TTY: 680-231-6549 TTY 534-267-7092) to talk to a trained counselor if you are experiencing a Mental Health or Behavioral Health Crisis or need someone to talk to.  Medford Balboa, BSN, RN [place]  VBCI - [employer] [facility] RN Care Manager [phone]

## 2024-01-28 NOTE — Radiation Completion Notes (Signed)
 Patient Name: Heather Chavez, Heather Chavez MRN: [id number] Date of Birth: Jan 08, 1946 Referring Physician: MARCELLUS BAPTIST, M.D. Date of Service: 2024-01-28 Radiation Oncologist: Lynwood Cedar, M.D. [facility]                             RADIATION ONCOLOGY END OF TREATMENT NOTE     Diagnosis: C34.91 Malignant neoplasm of unspecified part of right bronchus or lung Staging on 2024-01-06: Lung cancer, lower lobe (HCC) T=cT4, N=cN2b, M=cM1c1 Intent: Palliative     ==========DELIVERED PLANS==========  First Treatment Date: 2024-01-13 Last Treatment Date: 2024-01-21   Plan Name: Chest_Media Site: Mediastinum Technique: 3D Mode: Photon Dose Per Fraction: 3 Gy Prescribed Dose (Delivered / Prescribed): 6 Gy / 6 Gy Prescribed Fxs (Delivered / Prescribed): 2 / 2   Plan Name: Chest_Bst Site: Mediastinum Technique: 3D Mode: Photon Dose Per Fraction: 2 Gy Prescribed Dose (Delivered / Prescribed): 8 Gy / 16 Gy Prescribed Fxs (Delivered / Prescribed): 4 / 8     ==========ON TREATMENT VISIT DATES========== 2024-01-17     ==========UPCOMING VISITS========== 04/19/2024 [facility] NEW PATIENT 30 Gregg Lek, MD  02/28/2024 [facility] OFFICE VISIT - LINZIE Darlean Ozell KATHEE, MD  02/28/2024 [facility] PFT - PULM [facility] RM  02/09/2024 [facility] ONC EST PT 30 Cornelius Wanda DEL, MD  02/09/2024 [facility] ON[facility] PORT FLUSH W/LAB [facility] PORT FLUSH LAB  02/02/2024 [facility] [facility] ONC INFUSION 2HR30MIN (150) [facility] INFUSION CHAIR 7  02/01/2024 [facility] ONC INFUSION 2HR30MIN (150) [facility] INFUSION CHAIR 8  01/31/2024 [facility] [facility] ONC INFUSION 2HR30MIN (150) [facility] INFUSION CHAIR 8  01/31/2024 [facility] [facility] ONC EST PT 15 Parsons, Melissa A, NP  01/31/2024 [facility] ONC PORT FLUSH W/LAB [facility] PORT FLUSH LAB        ==========APPENDIX - ON TREATMENT VISIT NOTES==========   See weekly On  Treatment Notes in Epic for details in the Media tab (listed as Progress notes on the On Treatment Visit Dates listed above).

## 2024-01-28 NOTE — Transitions of Care (Post Inpatient/ED Visit) (Signed)
 01/28/2024  Name: Heather Chavez MRN: [id number] DOB: December 20, 1945  Today's TOC FU Call Status: Today's TOC FU Call Status:: Successful TOC FU Call Completed TOC FU Call Complete Date: 01/28/24  Patient's Name and Date of Birth confirmed. Name, DOB  Transition Care Management Follow-up Telephone Call Date of Discharge: 01/27/24 Discharge Facility: Other [facility]) Name of Other (Non[facility]) Discharge Facility: [facility] Type of Discharge: Inpatient Admission Primary Inpatient Discharge Diagnosis:: New A-fib, UTI How have you been since you were released from the hospital?: Better Any questions or concerns?: No  Items Reviewed: Did you receive and understand the discharge instructions provided?: Yes Medications obtained,verified, and reconciled?: Yes (Medications Reviewed) Any new allergies since your discharge?: No Dietary orders reviewed?: NA Do you have support at home?: Yes People in Home [RPT]: child(ren), adult Name of Support/Comfort Primary Source: Amy Reddick  Medications Reviewed Today: Medications Reviewed Today     Reviewed by Moises Reusing, RN (Case Manager) on 01/28/24 at 1418  Med List Status: <None>   Medication Order Taking? Sig Documenting Provider Last Dose Status Informant  albuterol  (VENTOLIN  HFA) 108 (90 Base) MCG/ACT inhaler [id number]  Inhale 2 puffs into the lungs every 4 (four) hours as needed for wheezing. [provider]  Active   allopurinol  (ZYLOPRIM ) 300 MG tablet [id number]  Take 1 tablet (300 mg total) by mouth daily. Cornelius Reusing DEL, MD  Active   Biotin 89999 MCG TABS [id number]  Take 10,000 mcg by mouth daily. [provider]  Active   Boswellia-Glucosamine-Vit D (OSTEO BI-FLEX ONE PER DAY) TABS [id number]  Take by mouth. [provider]  Active   budesonide -formoterol  (SYMBICORT ) 80-4.5 MCG/ACT inhaler [id number]  Inhale 2 puffs into the lungs 2 (two) times daily. [provider]   Active   dextromethorphan-guaiFENesin (ROBITUSSIN-DM) 10-100 MG/5ML liquid [id number]  Take 5 mLs by mouth every 6 (six) hours as needed for cough. [provider]  Active   diltiazem  (CARDIZEM  SR) 120 MG 12 hr capsule [id number] Yes Take 120 mg by mouth 2 (two) times daily. [provider]  Active   fexofenadine (ALLEGRA) 180 MG tablet [id number]  Take 180 mg by mouth. [provider]  Active   fluconazole  (DIFLUCAN ) 150 MG tablet [id number]  Take 1 tablet (150 mg total) by mouth daily. Harl Setter A, NP  Active   fluconazole  (DIFLUCAN ) 150 MG tablet [id number]  Take 1 tablet (150 mg total) by mouth daily. Harl Setter A, NP  Active   fluocinonide cream (LIDEX) 0.05 % [id number] Yes Apply 1 Application topically 3 (three) times daily. [provider]  Active   FLUoxetine (PROZAC) 20 MG capsule [id number]  Take 20 mg by mouth daily. [provider]  Active   furosemide (LASIX) 40 MG tablet [id number]  Take 40 mg by mouth. [provider]  Active   gabapentin (NEURONTIN) 600 MG tablet [id number]  Take 600 mg by mouth 2 (two) times daily. [provider]  Active   GARLIC PO [id number]  Take 1 tablet by mouth daily. [provider]  Active   HYDROcodone-acetaminophen  (NORCO/VICODIN) 5-325 MG tablet [id number]  Take 1 tablet by mouth every 6 (six) hours as needed for moderate pain (pain score 4-6).  Patient not taking: Reported on 01/28/2024   [provider]  Active   ipratropium-albuterol  (DUONEB) 0.5-2.5 (3) MG/3ML SOLN [id number]  Take 3 mLs by nebulization every 4 (four) hours as needed (breathing treatment). [provider]  Active   LORazepam  (ATIVAN ) 0.5 MG tablet  [id number]  Take by mouth. [provider]  Active   losartan-hydrochlorothiazide (HYZAAR) 50-12.5 MG tablet [id number]  Take 1 tablet by mouth daily.  Patient not taking: Reported on 01/28/2024   [provider]  Active   magic  mouthwash w/lidocaine  SOLN [id number] Yes Take 5 mLs by mouth 4 (four) times daily. Suspension contains equal amounts of Maalox Extra Strength, nystatin , diphenhydramine  and lidocaine . [provider]  Active   magnesium  oxide (MAG-OX) 400 (240 Mg) MG tablet [id number]  Take 1 tablet (400 mg total) by mouth daily. Cornelius Wanda DEL, MD  Active   metFORMIN (GLUCOPHAGE) 500 MG tablet [id number]  Take 500 mg by mouth daily. [provider]  Active   metoprolol  tartrate (LOPRESSOR ) 25 MG tablet [id number] Yes Take 25 mg by mouth 2 (two) times daily.  Patient taking differently: Take 150 mg by mouth 2 (two) times daily.   [provider]  Active   montelukast (SINGULAIR) 10 MG tablet [id number]  Take 10 mg by mouth at bedtime. [provider]  Active   Multiple Vitamins-Minerals (PRESERVISION AREDS 2 PO) [id number]  Take by mouth. [provider]  Active   nitrofurantoin (MACRODANTIN) 100 MG capsule [id number] Yes Take 100 mg by mouth 2 (two) times daily. [provider]  Active   nitroGLYCERIN  (NITROSTAT ) 0.4 MG SL tablet [id number]  Place 0.4 mg under the tongue every 5 (five) minutes as needed for chest pain. [provider]  Active   nystatin  (MYCOSTATIN /NYSTOP ) powder [id number]  Apply 1 Application topically 3 (three) times daily. Harl Setter A, NP  Active   ondansetron  (ZOFRAN ) 4 MG tablet [id number]  Take 1 tablet (4 mg total) by mouth every 4 (four) hours as needed for nausea. Harl Setter A, NP  Active   ondansetron  (ZOFRAN -ODT) 4 MG disintegrating tablet [id number]  Take 4 mg by mouth. [provider]  Active   OXYGEN  [id number] Yes Inhale 2.5 L/min into the lungs continuous. [provider]  Active   pantoprazole  (PROTONIX ) 40 MG tablet [id number] Yes Take 1 tablet (40 mg total) by mouth daily. Call [phone] to schedule an office visit for more refills  Patient taking differently: Take 40 mg by mouth 2 (two)  times daily. Call [phone] to schedule an office visit for more refills   Charlanne Groom, MD  Active   potassium chloride  SA (KLOR-CON  M) 20 MEQ tablet [id number]  Take 1 tablet (20 mEq total) by mouth daily. Cornelius Wanda DEL, MD  Active   prochlorperazine  (COMPAZINE ) 10 MG tablet [id number]  Take 1 tablet (10 mg total) by mouth every 6 (six) hours as needed for nausea or vomiting. Harl Setter A, NP  Active   vitamin E 180 MG (400 UNITS) capsule [id number]  Take 400 Units by mouth daily. [provider]  Active   zinc gluconate 50 MG tablet [id number]  Take 50 mg by mouth daily. [provider]  Active             Home Care and Equipment/Supplies: Were Home Health Services Ordered?: NA Any new equipment or medical supplies ordered?: NA  Functional Questionnaire: Do you need assistance with bathing/showering or dressing?: No Do you need assistance with meal preparation?: No Do you need assistance with eating?: No Do you have difficulty maintaining continence: No Do you need assistance with getting out of bed/getting out of a chair/moving?: No Do you have difficulty managing or taking your medications?: No  Follow up appointments reviewed: PCP Follow-up appointment confirmed?: No (  The patient will call. This a non-[facility] practice and the care guides cannot schedule) MD Provider Line [phone] Given: No Specialist Hospital Follow-up appointment confirmed?: Yes Date of Specialist follow-up appointment?: 01/31/24 Follow-Up Specialty Provider:: Dr. Cornelius Reason Specialist Follow-Up Not Confirmed: Patient has Specialist Provider Number and will Call for Appointment Do you need transportation to your follow-up appointment?: No Do you understand care options if your condition(s) worsen?: Yes-patient verbalized understanding  SDOH Interventions Today    Flowsheet Row Most Recent Value  SDOH Interventions   Food Insecurity Interventions Intervention Not  Indicated  Housing Interventions Intervention Not Indicated  Transportation Interventions Intervention Not Indicated  Utilities Interventions Intervention Not Indicated    Goals Addressed             This Visit's Progress    [facility] Transitions of Care (TOC) Care Plan       Problems:  Recent Hospitalization for treatment: New A-fib, Acute anemia/pancytopenia, Multi drug resistant E. Colu UTI   Patient reports feeling concern that cancer has spread and is awaiting PET scan, MRI, and appointment with Dr Wanda Cornelius 01/05/24 for answers - patient completed and reports Stage IV with first chemo treatment today 01/10/24  Goal:  Over the next 30 days, the patient will not experience hospital readmission  Interventions:  Transitions of Care: Doctor Visits  - discussed the importance of doctor visits - Patient is scheduled to see PCP, Dr Ina today - plans to discuss the fluid in her arms  Discussed diabetes history and patient states she doesn't check her sugar - not a priority at this time - A1C 7.2 12/29/23 During medication review-TOC RN educated on importance of discussing nitroglycerin  with provider as she feels hers has expired Discussed new diagnosis with patient who states she is nervous about her appt with oncology tomorrow but is wanting answers - states she feels the cancer has likely already spread elsewhere and states she has had breast cancer x 2  Update 01/10/24: Patient was at her first chemo treatment when [facility] RN called. Patient states Dr Cornelius told her she has stage  IV lung cancer that is not curable but is treatable and patient reports her chemo this week is today 01/10/24, Tu, Wed, and will return for labs and injection Friday 01/14/24. Patient states she wasn't surprised by the news and is just moving forward with treatment. Patient then said she had to end the call as the nurse needs to draw her blood - TOC RN asked permission to follow up again with patient and patient  was agreeable. TOC RN will try to reach her Thursday 01/13/24 - TOC RN was unable to review medications or assessments.  Update 01/13/24: Spoke with patient who was driving. Patient voicing feelings about diagnosis, chemo,swelling, and now having to have radiation. 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 Patient states the swelling is unbelievable and states every day it gets worse. Patient states she just is having troubling grasping and communicating to others exactly what's going on - states the minister was at her home today and feels she said more to him than anyone - states she talked to a granddaughter (who is a engineer, civil (consulting) and is her HCPOA) this morning. Patient states she told granddaughter she is willing to do whatever it takes and would like "to make it to  Christmas". Patient states she has not given all details to her friends or her sister who is in [place]  and will return before Thanksgiving. Patient states she doesn't want her sister to cancel her time away. TOC RN expressed to patient that when we talk, we can talk about whatever she wants to discuss and it's okay for her to say anything she is feeling. TOC RN discussed with patient that none of us  know our last day and hers could be due to the cancer or something else. Encouraged patient to think about her wishes and when she is ready to communicate her wishes and discuss Advanced Care Planning - Patient does have the HCPOA on record - TOC RN will discuss at a more appropriate time if patient would like to talk about DNR status vs Full Code. Patient states her children know she wants to be cremated. Patient then stated she is at the pickup line and is picking up her grandson and is no longer able to talk - [facility] RN will continue to meet patient where she is and allow her to talk about what is  important to her. Medications were not reviewed/patient was driving and focus was on allowing patient to voice her concerns. Not all assessments were completed. Will follow up with patient.    COPD Interventions: Advised patient to track and manage COPD triggers Assessed social determinant of health barriers Discussed the importance of adequate rest and management of fatigue with COPD Provided instruction about proper use of medications used for management of COPD including inhalers Use of home oxygen  new on 3 liters - Education provided on oxygen  safety including no smoking in the home - oxygen  in use sign on door - and notifying power company - patient asking about POC and was directed to discuss with provider as this requires an order   Oncology: Assessment of understanding of oncology diagnosis:  Reviewed upcoming provider appointments and treatment appointments Assessed available transportation to appointments and treatments. Has consistent/reliable transportation: Yes Assessed support system. Has consistent/reliable family or other support: Yes   AFIB Interventions:   Counseled on avoidance of NSAIDs due to increased bleeding risk with anticoagulants Counseled on importance of regular laboratory monitoring as prescribed Monitor heart rate and blood pressure at home and maintain a log Down the line there will be need to be another discussion in regards to starting anticoagulation therapy to reduce your risk of stroke   Patient Self Care Activities:  Attend all scheduled provider appointments Call pharmacy for medication refills 3-7 days in advance of running out of medications Call provider office for new concerns or questions  Notify RN Care Manager of TOC call rescheduling needs Participate in Transition of Care Program/Attend TOC scheduled calls Take medications as prescribed   identify and remove indoor air pollutants limit outdoor activity during cold weather keep follow-up  appointments: Pulmonary appointment scheduled with Dr Darlean and PFT 02/28/2024 Fluconazole  for the rash on your back UTI - Nitrofurantoin for four more days  Plan:  Telephone follow up appointment with care management team member scheduled for:  Monday November 24th The patient has been provided with contact information for the care management team and has been advised to call with any health related questions or concerns.         Medford Balboa, BSN, RN [place]  [facility] - [facility] RN Care Manager [phone]

## 2024-01-30 ENCOUNTER — Ambulatory Visit

## 2024-01-31 ENCOUNTER — Other Ambulatory Visit: Payer: Self-pay | Admitting: Hematology and Oncology

## 2024-01-31 ENCOUNTER — Encounter: Payer: Self-pay | Admitting: Oncology

## 2024-01-31 ENCOUNTER — Other Ambulatory Visit: Payer: Self-pay

## 2024-01-31 ENCOUNTER — Inpatient Hospital Stay (HOSPITAL_BASED_OUTPATIENT_CLINIC_OR_DEPARTMENT_OTHER): Admitting: Hematology and Oncology

## 2024-01-31 ENCOUNTER — Ambulatory Visit

## 2024-01-31 ENCOUNTER — Inpatient Hospital Stay

## 2024-01-31 ENCOUNTER — Other Ambulatory Visit: Payer: Self-pay | Admitting: Pharmacist

## 2024-01-31 VITALS — BP 114/57 | HR 78 | Temp 99.1°F | Resp 18 | Ht 65.0 in | Wt 208.3 lb

## 2024-01-31 DIAGNOSIS — R350 Frequency of micturition: Secondary | ICD-10-CM | POA: Diagnosis not present

## 2024-01-31 DIAGNOSIS — C787 Secondary malignant neoplasm of liver and intrahepatic bile duct: Secondary | ICD-10-CM | POA: Diagnosis not present

## 2024-01-31 DIAGNOSIS — Z510 Encounter for antineoplastic radiation therapy: Secondary | ICD-10-CM | POA: Diagnosis not present

## 2024-01-31 DIAGNOSIS — C3431 Malignant neoplasm of lower lobe, right bronchus or lung: Secondary | ICD-10-CM

## 2024-01-31 DIAGNOSIS — C3430 Malignant neoplasm of lower lobe, unspecified bronchus or lung: Secondary | ICD-10-CM | POA: Diagnosis not present

## 2024-01-31 DIAGNOSIS — E871 Hypo-osmolality and hyponatremia: Secondary | ICD-10-CM | POA: Diagnosis not present

## 2024-01-31 DIAGNOSIS — C3411 Malignant neoplasm of upper lobe, right bronchus or lung: Secondary | ICD-10-CM | POA: Diagnosis not present

## 2024-01-31 DIAGNOSIS — Z5111 Encounter for antineoplastic chemotherapy: Secondary | ICD-10-CM | POA: Diagnosis not present

## 2024-01-31 LAB — URINALYSIS, COMPLETE (UACMP) WITH MICROSCOPIC
Bilirubin Urine: NEGATIVE
Glucose, UA: NEGATIVE mg/dL
Ketones, ur: NEGATIVE mg/dL
Leukocytes,Ua: NEGATIVE
Nitrite: NEGATIVE
Protein, ur: NEGATIVE mg/dL
Specific Gravity, Urine: 1.015 (ref 1.005–1.030)
pH: 7.5 (ref 5.0–8.0)

## 2024-01-31 LAB — CMP (CANCER CENTER ONLY)
ALT: 30 U/L (ref 0–44)
AST: 30 U/L (ref 15–41)
Albumin: 3.5 g/dL (ref 3.5–5.0)
Alkaline Phosphatase: 146 U/L — ABNORMAL HIGH (ref 38–126)
Anion gap: 10 (ref 5–15)
BUN: 14 mg/dL (ref 8–23)
CO2: 30 mmol/L (ref 22–32)
Calcium: 9.4 mg/dL (ref 8.9–10.3)
Chloride: 94 mmol/L — ABNORMAL LOW (ref 98–111)
Creatinine: 0.74 mg/dL (ref 0.44–1.00)
GFR, Estimated: >60 mL/min (ref >60)
Glucose, Bld: 106 mg/dL — ABNORMAL HIGH (ref 70–99)
Potassium: 4.1 mmol/L (ref 3.5–5.1)
Sodium: 134 mmol/L — ABNORMAL LOW (ref 135–145)
Total Bilirubin: 0.3 mg/dL (ref 0.0–1.2)
Total Protein: 6.7 g/dL (ref 6.5–8.1)

## 2024-01-31 LAB — CBC WITH DIFFERENTIAL (CANCER CENTER ONLY)
Abs Immature Granulocytes: 0.57 K/uL — ABNORMAL HIGH (ref 0.00–0.07)
Basophils Absolute: 0 K/uL (ref 0.0–0.1)
Basophils Relative: 0 %
Eosinophils Absolute: 0 K/uL (ref 0.0–0.5)
Eosinophils Relative: 0 %
HCT: 30.9 % — ABNORMAL LOW (ref 36.0–46.0)
Hemoglobin: 9.9 g/dL — ABNORMAL LOW (ref 12.0–15.0)
Immature Granulocytes: 7 %
Lymphocytes Relative: 15 %
Lymphs Abs: 1.2 K/uL (ref 0.7–4.0)
MCH: 29.5 pg (ref 26.0–34.0)
MCHC: 32 g/dL (ref 30.0–36.0)
MCV: 92 fL (ref 80.0–100.0)
Monocytes Absolute: 0.9 K/uL (ref 0.1–1.0)
Monocytes Relative: 10 %
Neutro Abs: 5.7 K/uL (ref 1.7–7.7)
Neutrophils Relative %: 68 %
Platelet Count: 260 K/uL (ref 150–400)
RBC: 3.36 MIL/uL — ABNORMAL LOW (ref 3.87–5.11)
RDW: 14.6 % (ref 11.5–15.5)
WBC Count: 8.4 K/uL (ref 4.0–10.5)
nRBC: 0 % (ref 0.0–0.2)

## 2024-01-31 LAB — MAGNESIUM: Magnesium: 1.6 mg/dL — ABNORMAL LOW (ref 1.7–2.4)

## 2024-01-31 MED ORDER — HYDROCOD POLI-CHLORPHE POLI ER 10-8 MG/5ML PO SUER: 5.0000 mL | Freq: Two times a day (BID) | ORAL | 0 refills | Status: AC | PRN

## 2024-01-31 NOTE — Progress Notes (Signed)
 " [facility]  [address] [place],  [place]  [address] [phone]  Clinic Day:  01/21/2024  Referring physician: [name], MD   ASSESSMENT & PLAN:  Assessment:  Small Cell Lung Cancer This is newly diagnosed but found to be stage IV with liver metastases and significant bulky mediastinal adenopathy, right pleural based mass, and small right pleural effusion. She has been started on chemotherapy with Carboplatin , Etoposide , and Atezolizumab , with her first cycle given 10 days ago. She has had a rough time, but may be over the worst. I reviewed all the plans with the patient her daughter and we plan close follow-up and supportive care.   Superior Vena Cava Syndrome Due to this, palliative radiation was added and is helping. She had increased pain in the upper mediastinum earlier this week, but that has improved and she is tapering off the oxycodone. The edema of her arms has resolved and neck and face are greatly decreased.  Hyponatremia This is likely caused by SIADH from her small cell lung cancer and will be monitored closely. Her sodium is finally coming up now.  Hypomagnesemia She has received IV magnesium . Her level is still mildly low at 1.6, so I will place her on oral supplement of magnesium  oxide 400 mg daily.   Pneumonia This is multifocal and at least partially related to post obstruction of the right lower main bronchus.   Anemia This is likely related to her new small cell lung cancer and bilateral pneumonia. Her vitamin levels were all normal. Her hemoglobin dropped further to 7.6 on 01/19/2024 and remained that today. She was given 1 unit of PRBCs today.  Neutropenia She has profound neutropenia with an ANC down to 100, but this is her nadir and she did not receive Neulasta . I have reviewed neutropenic precautions with her and her daughter, and her counts should recover next week. We will recheck them on November 18th.   Thrombocytopenia Her  platelet count is not too low, but has gone below 100,000 and she has had some nosebleeds. We will continue to monitor.  History of Stage IA (T1b N0 M0) hormone receptor positive right breast cancer This was diagnosed in April, 2005. She was treated with a lumpectomy and pathology revealed a 6mm, grade 3, invasive ductal carcinoma with sentinel node positive with a 3mm micrometastasis. Estrogen and progesterone receptors were positive and her 2 neu negative. She was treated with 4 cycles of doxorubicin and cyclophosphamide. She was then placed on Anastrozole in November, 2005 and took that for 3.41yrs, but was switched to Tamoxifen in April, 2009 because of her arthralgias related to Anastrozole. She completed 5 years of adjuvant hormonal therapy.    History of Stage IA (T1b N0 M0) hormone receptor positive left breast cancer This was diagnosed in July, 2015. This was treated with lumpectomy and pathology revealed a 6mm, grade 1, ductal carcinoma with negative sentinel node. Estrogen and progesterone receptors were positive and her 2 neu negative and Ki 67 was 18%. She received adjuvant radiation to the left breast and was then placed on Anastrozole again in August, 2015. This was stopped in October, 2016 due to severe hot flashes, anxiety, and depression, mild cognitive difficulty, trouble finding words, and remembering things as well as becoming easily overwhelmed. She did not tolerate venlafaxine and was placed on paroxetine 20mg  daily in September, 2015 and this improved her symptoms. She was later changed to Lexapro. She was placed on Tamoxifen 20 mg daily for  adjuvant hormonal therapy for 5 years.   Plan: She feels well, but complains of dyspnea, especially with any exertion. We will plan for IVF as well as IV magnesium  today. I discussed the assessment and treatment plan with the patient.  The patient was provided an opportunity to ask questions and all were answered.  The patient agreed with the plan  and demonstrated an understanding of the instructions.  The patient was advised to call back if the symptoms worsen or if the condition fails to improve as anticipated.  I provided 20 minutes of face-to-face time during this this encounter and > 50% was spent counseling as documented under my assessment and plan.   [name], NP [facility] OF [facility]. [facility] [address] [place] [address] Dept: [phone] Dept Fax: [phone]   Orders Placed This Encounter  Procedures   Culture, Urine    Standing Status:   Future    Number of Occurrences:   1    Expiration Date:   01/30/2025   Urinalysis, Complete w Microscopic    Standing Status:   Future    Number of Occurrences:   1    Expected Date:   01/31/2024    Expiration Date:   01/30/2025   CHIEF COMPLAINT:  CC: Small Cell Lung Cancer  Current Treatment:  Chemotherapy and Immunotherapy   HISTORY OF PRESENT ILLNESS:  Heather Chavez is a 78 y.o. female with a history of bilateral breast cancer who is referred in consultation by Dr. [name] for assessment and management of newly diagnosed small cell lung cancer. She has a history of stage 1A right breast cancer 06/2003 (s/p lumpectomy, doxorubicin + cyclophosphamide, and 5 years of adjuvant hormonal therapy with tamoxifen/anastrozole), stage 1A left breast cancer (s/p lumpectomy, adjuvant radiation, and 5 years of adjuvant hormonal therapy with tamoxifen/anastrozole), COPD/asthma overlap, and tobacco use.  She underwent testing for hereditary breast and ovarian cancer with the [employer] OvaNext 25 gene panel, which did not reveal any clinically significant mutation. She did have a variant of uncertain significance of MSH2. She had a previous hysterectomy and unilateral oophorectomy for benign disease at a fairly young age, so does not require pap smears. Screening colonoscopy in February, 2017 revealed  multiple polyps, all of which were benign polypoid colonic mucosa.  Heather Chavez was admitted into the [facility] ED on October 20th, 2025 and presented with shortness of breath, fevers, and chills. She received a chest x-ray for further evaluation which revealed an right upper lobe infiltrate versus mass and chest CT angio revealed emphysema with chronic interstitial changes in both lung fields corresponding with multifocal pneumonitis and a suspicious pleural based mass in the right upper lobe measuring 3.9cm, and multiple suspicious enlarged mediastinal lymph nodes.  She was diagnosed with mass of the right lung, acute hypoxic respiratory failure, and bilateral community acquired pneumonia before being transferred to [facility]. She then had bronchoscopy and biopsy and pathology report was positive for small cell carcinoma with positive tumor cells for TTF-1 and neuroendocrine markers CD56, synaptophysin, and chromogranin. GATA-3 is negative and Pan keratin showed dot-like positive staining. She comes to me for discussion of staging and treatment and had a PET scan and brain MRI today but results are pending. I recommend systemic chemotherapy with Carboplatin  and Etopiside. She will also have immunotherapy, either concurrent or sequential depending on her staging. She will have her chemoeducation tomorrow and port  placed this Friday.   I have reviewed her chart and materials related to her cancer extensively and collaborated history with the patient. Summary of oncologic history is as follows: Oncology History  Lung cancer, lower lobe (HCC)  01/05/2024 Initial Diagnosis   Lung cancer, lower lobe (HCC)   01/06/2024 Cancer Staging   Staging form: Lung, AJCC V9 - Clinical stage from 01/06/2024: Stage IVB (cT4, cN2b, cM1c1) - Signed by Cornelius Wanda DEL, MD on 01/06/2024 Histopathologic type: Small cell carcinoma, NOS Stage prefix: Initial diagnosis Method of lymph node assessment:  Clinical IASLC grade: G3 Histologic grading system: 3 grade system Laterality: Right Tumor size (mm): 56 Sites of metastasis: Liver Lymph-vascular invasion (LVI): Presence of LVI unknown/indeterminate Diagnostic confirmation: Positive histology Specimen type: Bronchial Biopsy Staged by: Managing physician Standard uptake value (SUV): 12.5 Perineural invasion (PNI): Unknown Pleural/elastic layer invasion: Unknown Stage used in treatment planning: Yes National guidelines used in treatment planning: Yes Type of national guideline used in treatment planning: NCCN   01/10/2024 -  Chemotherapy   Patient is on Treatment Plan : LUNG SCLC Carboplatin  + Etoposide  + Atezolizumab  Induction q21d x 4 cycles / Atezolizumab  Maintenance q21d      INTERVAL HISTORY:  Evaline is seen in the clinic for follow up of her newly diagnosed small cell carcinoma. Patient states that she feels well, but complains of dyspnea, especially with any exertion. She is wearing a nasal cannula in the office today. She states that her pain has improved with oxycodone. I recommended that she take it at bedtime to better control nighttime pain. She received 1 unit of PRBCs and radiation today and will continue daily radiation for another week. She has a critically low WBC of 0.7 with an ANC of 100 down from 2.2 with an ANC of 1700, a stable low hemoglobin of 7.6, and a low platelet count of 94,000 down from 116,000. Her CMP reveals a low sodium of 130, up from 125, and an elevated ALT of 74, up from 98. Her magnesium  is low at 1.6, down form 1.9. She was not able to receive the Neulasta  injection with the first cycle. I will prescribe oral 400 mg magnesium  oxide once daily and discussed the side effects including diarrhea. I advised her to stay away from large crowds and to limit visitors. I instructed her to monitor for symptoms of infection such as cough with yellow-green mucus, fever, and chills. She currently takes 40 mg  furosemide daily without difficulty. I advised her to elevate her legs often and to remain on her current furosemide dose. I instructed her to take an extra half dose if her swelling worsens. She is scheduled for day 1, cycle 2 of Carboplatin  + Etoposide  + Atezolizumab  on 01/31/2024. We will see her back next week with CBC, CMP, and magnesium .  She denies fever, chills, night sweats, or other signs of infection. She denies cardiorespiratory and gastrointestinal issues. She  denies pain. Her appetite is okay and Her weight has increased 3.5 pounds over last 4 weeks. She is accompanied by her daughter.  HISTORY:   Past Medical History:  Diagnosis Date   Advanced nonexudative age-related macular degeneration of right eye with subfoveal involvement 09/21/2019   Advanced nonexudative age-related macular degeneration of right eye without subfoveal involvement 09/21/2019   Allergic rhinitis    Angina pectoris 05/17/2020   Bilateral breast cancer (HCC) 11/23/2013   BMI 34.0-34.9,adult 09/25/2019   Breast cancer (HCC)    Bronchitis, chronic (HCC)    CAD (coronary  artery disease)    Cancer (HCC)    Cardiac murmur 05/17/2020   Coronary artery calcification 05/17/2020   Degenerative retinal drusen of right eye 09/21/2019   Diabetes mellitus due to underlying condition with unspecified complications (HCC) 05/17/2020   Diverticulosis    Dry eyes 06/25/2016   Dyspnea    Emphysema/COPD    Essential hypertension 05/17/2020   Estrogen receptor positive neoplasm 09/24/2015   Family history of malignant neoplasm of breast 11/23/2013   Family history of peritoneal cancer 11/23/2013   Fuchs' corneal dystrophy 11/03/2016   GERD (gastroesophageal reflux disease)    History of colon polyps    History of right breast cancer 09/19/2018   Hypertension    IBS (irritable bowel syndrome)    with D   Intermediate stage nonexudative age-related macular degeneration of left eye 09/21/2019   Iritis 11/03/2016    Macular degeneration    Major depressive disorder    Malignant neoplasm of upper-inner quadrant of left breast in female, estrogen receptor positive (HCC) 09/01/2016   Mixed dyslipidemia 05/17/2020   Obesity    Obesity (BMI 35.0-39.9 without comorbidity) 05/17/2020   OSA on CPAP 10/29/2020   Diagnosed some 3 months prior after pneumonia episode, Dr. Shellia pulmonology   Peripheral vascular disease    Personal history of chemotherapy    Personal history of radiation therapy    Pseudophakia 09/21/2019   Right-sided chest wall pain    SVT (supraventricular tachycardia) 05/23/2020   Varicose veins of left lower extremity with complications 08/11/2016   Past Surgical History:  Procedure Laterality Date   BREAST BIOPSY     BREAST LUMPECTOMY     right brwast 2005/ left 2015   CATARACT EXTRACTION     CHOLECYSTECTOMY     COLONOSCOPY  04/17/2015   Colonic polyps status post polypectomy. Moderate to severe sigmoid diverticulosis   ENDOVENOUS ABLATION SAPHENOUS VEIN W/ LASER Right 02/19/2023   endovenous laser ablation right greater saphenous vein and stab phlebectomy 10-20 incisions right leg by Gaile New MD   TUBAL LIGATION     VAGINAL HYSTERECTOMY     VEIN LIGATION AND STRIPPING     Family History  Problem Relation Age of Onset   Cancer Mother 8       bilateral breast cancer ages 4 then 54. Peritoneal cancer at age 76.   Other Mother        Ms. Anctil's mother was adopted so have no information regarding maternal family  history other than her mother   Breast cancer Mother    Cancer Father 82       renal   Cancer Sister 70       breast   Breast cancer Sister    Breast cancer Daughter    Breast cancer Maternal Aunt    Breast cancer Paternal Aunt    Breast cancer Paternal Grandmother    Cancer Paternal Grandmother        bilateral breast cancer at unknown age   Social History:  reports that she quit smoking about 43 years ago. Her smoking use included cigarettes. She  started smoking about 58 years ago. She has a 37.5 pack-year smoking history. She has never used smokeless tobacco. She reports current alcohol use. She reports that she does not use drugs.The patient is accompanied by her daughter and granddaughter today.  Allergies:  Allergies  Allergen Reactions   Codeine Nausea And Vomiting and Other (See Comments)    Other reaction(s): Other (see comments), Vomiting  Levofloxacin Other (See Comments)    Leg cramps   Nexlizet  [Bempedoic Acid-Ezetimibe ] Other (See Comments)   Pitavastatin  Other (See Comments)    Leg cramps  Other Reaction(s): Other (See Comments)  Leg cramps    Leg cramps   Rosuvastatin  Other (See Comments)    Muscle pain    Current Medications: Current Outpatient Medications  Medication Sig Dispense Refill   albuterol  (VENTOLIN  HFA) 108 (90 Base) MCG/ACT inhaler Inhale 2 puffs into the lungs every 4 (four) hours as needed for wheezing.     allopurinol  (ZYLOPRIM ) 300 MG tablet Take 1 tablet (300 mg total) by mouth daily. (Patient not taking: Reported on 03/07/2024) 30 tablet 0   Biotin 89999 MCG TABS Take 10,000 mcg by mouth daily.     budesonide -formoterol  (SYMBICORT ) 80-4.5 MCG/ACT inhaler Inhale 2 puffs into the lungs 2 (two) times daily.     fluocinonide cream (LIDEX) 0.05 % Apply 1 Application topically 3 (three) times daily. (Patient not taking: Reported on 03/07/2024)     gabapentin (NEURONTIN) 600 MG tablet Take 600 mg by mouth 2 (two) times daily.     GARLIC PO Take 1 tablet by mouth daily.     ipratropium-albuterol  (DUONEB) 0.5-2.5 (3) MG/3ML SOLN Take 3 mLs by nebulization every 4 (four) hours as needed (breathing treatment).     LORazepam  (ATIVAN ) 0.5 MG tablet Take by mouth.     magic mouthwash w/lidocaine  SOLN Take 5 mLs by mouth 4 (four) times daily. Suspension contains equal amounts of Maalox Extra Strength, nystatin , diphenhydramine  and lidocaine . (Patient not taking: Reported on 03/07/2024)     magnesium  oxide  (MAG-OX) 400 (240 Mg) MG tablet Take 1 tablet (400 mg total) by mouth daily. 30 tablet 5   metFORMIN (GLUCOPHAGE) 500 MG tablet Take 500 mg by mouth daily.     metoprolol  tartrate (LOPRESSOR ) 100 MG tablet Take 150 mg by mouth 2 (two) times daily.     montelukast (SINGULAIR) 10 MG tablet Take 10 mg by mouth at bedtime.     Multiple Vitamins-Minerals (PRESERVISION AREDS 2 PO) Take by mouth.     nystatin  (MYCOSTATIN /NYSTOP ) powder Apply 1 Application topically 3 (three) times daily. (Patient not taking: Reported on 03/07/2024) 15 g 0   ondansetron  (ZOFRAN ) 4 MG tablet Take 1 tablet (4 mg total) by mouth every 4 (four) hours as needed for nausea. 90 tablet 3   OXYGEN  Inhale 2.5 L/min into the lungs continuous.     pantoprazole  (PROTONIX ) 40 MG tablet Take 1 tablet (40 mg total) by mouth daily. Call [phone] to schedule an office visit for more refills 90 tablet 0   potassium chloride  SA (KLOR-CON  M) 20 MEQ tablet Take 1 tablet (20 mEq total) by mouth daily. 30 tablet 5   prochlorperazine  (COMPAZINE ) 10 MG tablet Take 1 tablet (10 mg total) by mouth every 6 (six) hours as needed for nausea or vomiting. 90 tablet 3   vitamin E 180 MG (400 UNITS) capsule Take 400 Units by mouth daily.     zinc gluconate 50 MG tablet Take 50 mg by mouth daily.     chlorpheniramine-HYDROcodone  (TUSSIONEX) 10-8 MG/5ML Take 5 mLs by mouth every 12 (twelve) hours as needed for cough. 473 mL 0   diltiazem  (CARDIZEM  CD) 120 MG 24 hr capsule Take 1 capsule (120 mg total) by mouth daily. 90 capsule 0   HYDROcodone -Acetaminophen  10-300 MG TABS Take 1 tablet by mouth every 4 (four) hours as needed. 100 tablet 0   loratadine (CLARITIN) 10 MG  tablet Take 10 mg by mouth daily.     nitroGLYCERIN  (NITROSTAT ) 0.4 MG SL tablet Place 0.4 mg under the tongue every 5 (five) minutes as needed for chest pain.     ondansetron  (ZOFRAN -ODT) 8 MG disintegrating tablet Take by mouth.     sertraline (ZOLOFT) 50 MG tablet Take 50 mg by mouth  daily.     torsemide  (DEMADEX ) 20 MG tablet Take 2 tablets (40 mg total) by mouth daily. 60 tablet 5   No current facility-administered medications for this visit.    REVIEW OF SYSTEMS:  Review of Systems  Constitutional: Negative.  Negative for appetite change, chills, diaphoresis, fatigue, fever and unexpected weight change.  HENT:   Positive for nosebleeds. Negative for hearing loss, lump/mass, mouth sores, sore throat, tinnitus, trouble swallowing and voice change.   Eyes:  Positive for eye problems (vision changes).  Respiratory:  Positive for cough and shortness of breath. Negative for chest tightness, hemoptysis and wheezing.   Cardiovascular:  Positive for leg swelling. Negative for chest pain and palpitations.  Gastrointestinal:  Negative for abdominal distention, abdominal pain, blood in stool, constipation, diarrhea, nausea, rectal pain and vomiting.       Bowel incontinence upon straining  Endocrine: Negative.   Genitourinary:  Positive for bladder incontinence (upon straining). Negative for difficulty urinating, dyspareunia, dysuria, frequency, hematuria, menstrual problem, nocturia, pelvic pain, vaginal bleeding and vaginal discharge.   Musculoskeletal: Negative.  Negative for arthralgias, back pain, flank pain, gait problem, myalgias, neck pain and neck stiffness.  Skin: Negative.  Negative for itching, rash and wound.  Neurological:  Positive for headaches and numbness (neuropathy of bilateral feet). Negative for dizziness, extremity weakness, gait problem, light-headedness, seizures and speech difficulty.  Hematological:  Negative for adenopathy. Bruises/bleeds easily.  Psychiatric/Behavioral: Negative.  Negative for depression and sleep disturbance. The patient is not nervous/anxious.    VITALS:  Blood pressure (!) 114/57, pulse 78, temperature 99.1 F (37.3 C), temperature source Oral, resp. rate 18, height 5' 5 (1.651 m), weight 208 lb 4.8 oz (94.5 kg), SpO2 100%.  Wt  Readings from Last 3 Encounters:  03/24/24 207 lb 4 oz (94 kg)  03/20/24 207 lb 4.8 oz (94 kg)  03/13/24 202 lb 14.4 oz (92 kg)    Body mass index is 34.66 kg/m.  Performance status (ECOG): 1 - Symptomatic but completely ambulatory  PHYSICAL EXAM:  Physical Exam Vitals and nursing note reviewed. Exam conducted with a chaperone present.  Constitutional:      General: She is not in acute distress.    Appearance: Normal appearance. She is normal weight. She is not ill-appearing, toxic-appearing or diaphoretic.  HENT:     Head: Normocephalic and atraumatic.     Right Ear: Tympanic membrane, ear canal and external ear normal. There is no impacted cerumen.     Left Ear: Tympanic membrane, ear canal and external ear normal. There is no impacted cerumen.     Nose: Nose normal. No congestion or rhinorrhea.     Mouth/Throat:     Mouth: Mucous membranes are moist.     Pharynx: Oropharynx is clear. No oropharyngeal exudate or posterior oropharyngeal erythema.  Eyes:     General: No scleral icterus.       Right eye: No discharge.        Left eye: No discharge.     Extraocular Movements: Extraocular movements intact.     Conjunctiva/sclera: Conjunctivae normal.     Pupils: Pupils are equal, round, and reactive to light.  Cardiovascular:     Rate and Rhythm: Normal rate and regular rhythm.     Pulses: Normal pulses.     Heart sounds: Normal heart sounds. No murmur heard.    No friction rub. No gallop.  Pulmonary:     Effort: Pulmonary effort is normal. No respiratory distress.     Breath sounds: Examination of the right-lower field reveals decreased breath sounds. Decreased breath sounds present.  Abdominal:     General: Bowel sounds are normal. There is no distension.     Palpations: Abdomen is soft. There is hepatomegaly (mild). There is no splenomegaly or mass.     Tenderness: There is no abdominal tenderness. There is no right CVA tenderness, left CVA tenderness, guarding or rebound.      Hernia: No hernia is present.  Musculoskeletal:        General: Normal range of motion.     Cervical back: Normal range of motion and neck supple.     Right lower leg: Edema (1-2+) present.     Left lower leg: Edema (1-2+) present.     Comments: Bilateral lower leg 1-2+ edema  Lymphadenopathy:     Cervical: No cervical adenopathy.     Right cervical: No superficial, deep or posterior cervical adenopathy.    Left cervical: No superficial, deep or posterior cervical adenopathy.     Upper Body:     Right upper body: No supraclavicular, axillary or pectoral adenopathy.     Left upper body: No supraclavicular, axillary or pectoral adenopathy.  Skin:    General: Skin is warm and dry.  Neurological:     General: No focal deficit present.     Mental Status: She is alert and oriented to person, place, and time. Mental status is at baseline.  Psychiatric:        Mood and Affect: Mood normal.        Behavior: Behavior normal.        Thought Content: Thought content normal.        Judgment: Judgment normal.    LABS:      Latest Ref Rng & Units 03/27/2024    9:45 AM 03/20/2024    9:26 AM 03/13/2024   10:05 AM  CBC  WBC 4.0 - 10.5 K/uL 48.8  9.3  6.1   Hemoglobin 12.0 - 15.0 g/dL 8.6  8.1  8.3   Hematocrit 36.0 - 46.0 % 27.0  25.2  25.4   Platelets 150 - 400 K/uL 210  157  51       Latest Ref Rng & Units 03/27/2024    9:45 AM 03/20/2024    9:26 AM 03/13/2024   10:05 AM  CMP  Glucose 70 - 99 mg/dL 802  844  825   BUN 8 - 23 mg/dL 23  15  18    Creatinine 0.44 - 1.00 mg/dL 9.08  9.10  9.09   Sodium 135 - 145 mmol/L 136  139  141   Potassium 3.5 - 5.1 mmol/L 3.4  3.9  3.2   Chloride 98 - 111 mmol/L 96  98  97   CO2 22 - 32 mmol/L 29  32  33   Calcium  8.9 - 10.3 mg/dL 9.5  9.8  9.4   Total Protein 6.5 - 8.1 g/dL 6.3  6.6  6.5   Total Bilirubin 0.0 - 1.2 mg/dL 0.6  0.2  0.3   Alkaline Phos 38 - 126 U/L 138  135  143   AST 15 -  41 U/L 23  21  23    ALT 0 - 44 U/L 23  15  18        Lab Results  Component Value Date   CEA 11.87 (H) 01/06/2024   /  CEA ([facility])  Date Value Ref Range Status  01/06/2024 11.87 (H) 0.00 - 5.00 ng/mL Final    Comment:    (NOTE) This test was performed using [employer] Coulter's paramagnetic chemiluminescent immunoassay. Values obtained from different assay methods cannot be used interchangeably. Please note that up to 8% of patients who smoke may see values 5.1-10.0 ng/ml and 1% of patients who smoke may see CEA levels >10.0 ng/ml. Performed at [facility], [address] [place], [place] [address]    No results found for: PSA1 No results found for: CAN199 No results found for: CAN125  No results found for: STEPHANY RINGS, A1GS, A2GS, BETS, BETA2SER, GAMS, MSPIKE, SPEI Lab Results  Component Value Date   TIBC 316 01/10/2024   FERRITIN 297 01/10/2024   IRONPCTSAT 27 01/10/2024   No results found for: LDH  STUDIES:  EXAM: 01/05/2024 MRI HEAD WITH WITHOUT CM IMPRESSION: Relatively age-appropriate atrophy and mall-vessel disease  EXAM: 01/05/2024 PET SKULL BASE TO MID-THIGH IMPRESSION: Irregular solid noncalcified peripheral right upper lobe mass likely primary neoplasm. Extensive mediastinal and right hilar metastatic adenopathy. Extensive hepatic metastatic disease. Probable single metastatic lesion in the left proximal femur. No definitive adrenal metastatic disease.   Exam: 12/29/2023 Pathology Impression: Lung, right upper lobe, needle core biopsy: Small Cell Carcinoma The tumor cells are positive for TTF-1 and neuroendocrine markers CD56, synaptophysin, and chromogranin. GATA-3 is negative, Pan keratin shows dot-like positive staining.   I,Macaila Tahir A Zadaya Cuadra,acting as a neurosurgeon for [employer], NP.,have documented all relevant documentation on the behalf of [name], NP,as directed by  [name], NP while in the presence of [name], NP.  I have reviewed this report as typed by the medical scribe, and it is complete and accurate.    "

## 2024-02-01 ENCOUNTER — Encounter: Payer: Self-pay | Admitting: Oncology

## 2024-02-01 ENCOUNTER — Ambulatory Visit

## 2024-02-01 ENCOUNTER — Inpatient Hospital Stay

## 2024-02-01 LAB — URINE CULTURE: Culture: NO GROWTH

## 2024-02-02 ENCOUNTER — Ambulatory Visit

## 2024-02-02 ENCOUNTER — Inpatient Hospital Stay

## 2024-02-02 ENCOUNTER — Telehealth: Payer: Self-pay

## 2024-02-02 DIAGNOSIS — Z6833 Body mass index (BMI) 33.0-33.9, adult: Secondary | ICD-10-CM | POA: Diagnosis not present

## 2024-02-02 DIAGNOSIS — F331 Major depressive disorder, recurrent, moderate: Secondary | ICD-10-CM | POA: Diagnosis not present

## 2024-02-02 NOTE — Transitions of Care (Post Inpatient/ED Visit) (Signed)
 Transition of Care week 2  Visit Note  02/02/2024  Name: Heather Chavez MRN: [id number]          DOB: [birth year]  Situation: Patient enrolled in [facility] 30-day program. Visit completed with patient by telephone.   Background: Admit/Discharge Date  11/17 - 01/27/2024  Primary Diagnosis: New Atrial Afib, UTI, Anemia - f/u cardio Dr Debarah 02/08/2024  Initial Transition Care Management Follow-up Telephone Call Discharge Date and Diagnosis: 01/27/24, New A-fib, UTI   Past Medical History:  Diagnosis Date   Advanced nonexudative age-related macular degeneration of right eye with subfoveal involvement 09/21/2019   Advanced nonexudative age-related macular degeneration of right eye without subfoveal involvement 09/21/2019   Allergic rhinitis    Angina pectoris 05/17/2020   Bilateral breast cancer (HCC) 11/23/2013   BMI 34.0-34.9,adult 09/25/2019   Breast cancer (HCC)    Bronchitis, chronic (HCC)    CAD (coronary artery disease)    Cancer (HCC)    Cardiac murmur 05/17/2020   Coronary artery calcification 05/17/2020   Degenerative retinal drusen of right eye 09/21/2019   Diabetes mellitus due to underlying condition with unspecified complications (HCC) 05/17/2020   Diverticulosis    Dry eyes 06/25/2016   Dyspnea    Emphysema/COPD    Essential hypertension 05/17/2020   Estrogen receptor positive neoplasm 09/24/2015   Family history of malignant neoplasm of breast 11/23/2013   Family history of peritoneal cancer 11/23/2013   Fuchs' corneal dystrophy 11/03/2016   GERD (gastroesophageal reflux disease)    History of colon polyps    History of right breast cancer 09/19/2018   Hypertension    IBS (irritable bowel syndrome)    with D   Intermediate stage nonexudative age-related macular degeneration of left eye 09/21/2019   Iritis 11/03/2016   Macular degeneration    Major depressive disorder    Malignant neoplasm of upper-inner quadrant of left breast in female, estrogen receptor  positive (HCC) 09/01/2016   Mixed dyslipidemia 05/17/2020   Obesity    Obesity (BMI 35.0-39.9 without comorbidity) 05/17/2020   OSA on CPAP 10/29/2020   Diagnosed some 3 months prior after pneumonia episode, Dr. Shellia pulmonology   Peripheral vascular disease    Personal history of chemotherapy    Personal history of radiation therapy    Pseudophakia 09/21/2019   Right-sided chest wall pain    SVT (supraventricular tachycardia) 05/23/2020   Varicose veins of left lower extremity with complications 08/11/2016    Assessment: Patient Reported Symptoms: Cognitive Cognitive Status: No symptoms reported, Normal speech and language skills, Alert and oriented to person, place, and time      Neurological Neurological Review of Symptoms: No symptoms reported    HEENT HEENT Symptoms Reported: No symptoms reported      Cardiovascular Cardiovascular Symptoms Reported: Swelling in legs or feet (patient educated to elevate while sitting - take Lasix as directed) Weight: 205 lb (93 kg)  Respiratory Respiratory Symptoms Reported: Productive cough Respiratory Management Strategies: Oxygen  therapy, Activity, Adequate rest, Medication therapy  Endocrine Endocrine Symptoms Reported: No symptoms reported    Gastrointestinal Gastrointestinal Symptoms Reported: Constipation Additional Gastrointestinal Details: Patient states, I'm beginning to be (a little constipated) and states if she needs it Md said try [place]      Genitourinary Genitourinary Symptoms Reported: No symptoms reported    Integumentary      Musculoskeletal          Psychosocial           Today's Vitals   02/02/24 1450  Weight:  205 lb (93 kg)   Pain Scale: 0-10 Pain Score: 0-No pain  Medications Reviewed Today     Reviewed by Lauro Shona LABOR, RN (Registered Nurse) on 02/02/24 at 1447  Med List Status: <None>   Medication Order Taking? Sig Documenting Provider Last Dose Status Informant  albuterol  (VENTOLIN  HFA)  108 (90 Base) MCG/ACT inhaler [id number] Yes Inhale 2 puffs into the lungs every 4 (four) hours as needed for wheezing. [provider]  Active   allopurinol  (ZYLOPRIM ) 300 MG tablet [id number] Yes Take 1 tablet (300 mg total) by mouth daily. Cornelius Wanda DEL, MD  Active   Biotin 89999 MCG TABS [id number] Yes Take 10,000 mcg by mouth daily. [provider]  Active   Boswellia-Glucosamine-Vit D (OSTEO BI-FLEX ONE PER DAY) TABS [id number]  Take by mouth.  Patient not taking: Reported on 02/02/2024   [provider]  Active   budesonide -formoterol  (SYMBICORT ) 80-4.5 MCG/ACT inhaler [id number] Yes Inhale 2 puffs into the lungs 2 (two) times daily. [provider]  Active   chlorpheniramine-HYDROcodone (TUSSIONEX) 10-8 MG/5ML [id number] Yes Take 5 mLs by mouth every 12 (twelve) hours as needed for cough. Harl Setter A, NP  Active   dextromethorphan-guaiFENesin (ROBITUSSIN-DM) 10-100 MG/5ML liquid [id number]  Take 5 mLs by mouth every 6 (six) hours as needed for cough.  Patient not taking: Reported on 02/02/2024   [provider]  Active   diltiazem  (CARDIZEM  CD) 120 MG 24 hr capsule [id number] Yes Take 120 mg by mouth daily. [provider]  Active   fexofenadine (ALLEGRA) 180 MG tablet [id number] Yes Take 180 mg by mouth. [provider]  Active   fluocinonide cream (LIDEX) 0.05 % [id number]  Apply 1 Application topically 3 (three) times daily.  Patient not taking: Reported on 02/02/2024   [provider]  Active   FLUoxetine (PROZAC) 20 MG capsule [id number] Yes Take 20 mg by mouth daily. [provider]  Active   furosemide (LASIX) 40 MG tablet [id number] Yes Take 40 mg by mouth. [provider]  Active   gabapentin (NEURONTIN) 600 MG tablet [id number] Yes Take 600 mg by mouth 2 (two) times daily. [provider]  Active   GARLIC PO [id number] Yes Take 1 tablet by mouth daily. [provider]   Active   HYDROcodone-acetaminophen  (NORCO/VICODIN) 5-325 MG tablet [id number]  Take 1 tablet by mouth every 6 (six) hours as needed for moderate pain (pain score 4-6).  Patient not taking: Reported on 02/02/2024   [provider]  Active   ipratropium-albuterol  (DUONEB) 0.5-2.5 (3) MG/3ML SOLN [id number] Yes Take 3 mLs by nebulization every 4 (four) hours as needed (breathing treatment). [provider]  Active   LORazepam  (ATIVAN ) 0.5 MG tablet [id number] Yes Take by mouth. [provider]  Active   magic mouthwash w/lidocaine  SOLN [id number] Yes Take 5 mLs by mouth 4 (four) times daily. Suspension contains equal amounts of Maalox Extra Strength, nystatin , diphenhydramine  and lidocaine . [provider]  Active   magnesium  oxide (MAG-OX) 400 (240 Mg) MG tablet [id number] Yes Take 1 tablet (400 mg total) by mouth daily. Cornelius Wanda DEL, MD  Active   metFORMIN (GLUCOPHAGE) 500 MG tablet [id number] Yes Take 500 mg by mouth daily. [provider]  Active   metoprolol  tartrate (LOPRESSOR ) 100 MG tablet [id number] Yes Take 150 mg by mouth 2 (two) times daily. [provider]  Active   montelukast (SINGULAIR) 10 MG tablet [id number] Yes Take 10 mg by mouth at bedtime. [provider]  Active   Multiple Vitamins-Minerals (PRESERVISION AREDS 2 PO) [id number] Yes Take by mouth. [provider]  Active   nitrofurantoin (MACRODANTIN) 100 MG capsule [id number] Yes Take 100 mg by mouth 2 (two) times daily. [provider]  Active   nitroGLYCERIN  (NITROSTAT ) 0.4 MG SL tablet [id number] Yes Place 0.4 mg under the tongue every 5 (five) minutes as needed for chest pain. [provider]  Active   nystatin  (MYCOSTATIN /NYSTOP ) powder [id number] Yes Apply 1 Application topically 3 (three) times daily. Harl Setter A, NP  Active   ondansetron  (ZOFRAN ) 4 MG tablet [id number] Yes Take 1 tablet (4 mg total) by mouth every 4 (four) hours as  needed for nausea. Harl Setter A, NP  Active   ondansetron  (ZOFRAN -ODT) 4 MG disintegrating tablet [id number]  Take 4 mg by mouth.  Patient not taking: Reported on 02/02/2024   [provider]  Active   OXYGEN  [id number] Yes Inhale 2.5 L/min into the lungs continuous. [provider]  Active   pantoprazole  (PROTONIX ) 40 MG tablet [id number] Yes Take 1 tablet (40 mg total) by mouth daily. Call [phone] to schedule an office visit for more refills  Patient taking differently: Take 40 mg by mouth 2 (two) times daily. Call [phone] to schedule an office visit for more refills   Charlanne Groom, MD  Active   potassium chloride  SA (KLOR-CON  M) 20 MEQ tablet [id number] Yes Take 1 tablet (20 mEq total) by mouth daily. Cornelius Wanda DEL, MD  Active   prochlorperazine  (COMPAZINE ) 10 MG tablet [id number] Yes Take 1 tablet (10 mg total) by mouth every 6 (six) hours as needed for nausea or vomiting. Harl Setter A, NP  Active   vitamin E 180 MG (400 UNITS) capsule [id number] Yes Take 400 Units by mouth daily. [provider]  Active   zinc gluconate 50 MG tablet [id number] Yes Take 50 mg by mouth daily. [provider]  Active             Recommendation:   Continue Current Plan of Care  Follow Up Plan:   Telephone follow up appointment date/time:  02/10/24 in the afternoon  Shona Prow RN, CCM [place]  VBCI-Population Health RN Care Manager [phone]

## 2024-02-02 NOTE — Patient Instructions (Signed)
 Visit Information  Thank you for taking time to visit with me today. Please don't hesitate to contact me if I can be of assistance to you before our next scheduled telephone appointment.  Our next appointment is by telephone on 02/10/24 in the afternoon  Following is a copy of your care plan:   Goals Addressed             This Visit's Progress    VBCI Transitions of Care (TOC) Care Plan       Problems:  Recent Hospitalization for treatment: New A-fib, Acute anemia/pancytopenia, Multi drug resistant E. Colu UTI   Patient reports feeling concern that cancer has spread and is awaiting PET scan, MRI, and appointment with Dr Wanda Cornish 01/05/24 for answers - patient completed and reports Stage IV with first chemo treatment today 01/10/24  Goal:  Over the next 30 days, the patient will not experience hospital readmission  Interventions:  Transitions of Care: Doctor Visits  - discussed the importance of doctor visits - Patient is scheduled to see PCP, Dr Ina today - plans to discuss the fluid in her arms  Discussed diabetes history and patient states she doesn't check her sugar - not a priority at this time - A1C 7.2 12/29/23 During medication review-TOC RN educated on importance of discussing nitroglycerin  with provider as she feels hers has expired Discussed new diagnosis with patient who states she is nervous about her appt with oncology tomorrow but is wanting answers - states she feels the cancer has likely already spread elsewhere and states she has had breast cancer x 2  Update 01/10/24: Patient was at her first chemo treatment when [facility] RN called. Patient states Dr Cornish told her she has stage  IV lung cancer that is not curable but is treatable and patient reports her chemo this week is today 01/10/24, Tu, Wed, and will return for labs and injection Friday 01/14/24. Patient states she wasn't surprised by the news and is just moving forward with treatment. Patient then said she had  to end the call as the nurse needs to draw her blood - TOC RN asked permission to follow up again with patient and patient was agreeable. TOC RN will try to reach her Thursday 01/13/24 - TOC RN was unable to review medications or assessments.  Update 01/13/24: Spoke with patient who was driving. Patient voicing feelings about diagnosis, chemo,swelling, and now having to have radiation. 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 Patient states the swelling is unbelievable and states every day it gets worse. Patient states she just is having troubling grasping and communicating to others exactly what's going on - states the minister was at her home today and feels she said more to him than anyone - states she talked to a granddaughter (who is a engineer, civil (consulting) and is her HCPOA) this morning. Patient states she told granddaughter she is willing to do whatever it takes and would like "to make it to Christmas". Patient states she has not given all details to her friends or her sister who is in [place]  and will return before Thanksgiving. Patient states she doesn't want her sister to cancel her time away. TOC RN expressed to patient that when we talk, we can talk about whatever she wants to discuss and it's okay for her to say anything  she is feeling. TOC RN discussed with patient that none of us  know our last day and hers could be due to the cancer or something else. Encouraged patient to think about her wishes and when she is ready to communicate her wishes and discuss Advanced Care Planning - Patient does have the HCPOA on record - TOC RN will discuss at a more appropriate time if patient would like to talk about DNR status vs Full Code. Patient states her children know she wants to be cremated. Patient then stated she is at the pickup line and is picking up her grandson  and is no longer able to talk - [facility] RN will continue to meet patient where she is and allow her to talk about what is important to her. Medications were not reviewed/patient was driving and focus was on allowing patient to voice her concerns. Not all assessments were completed. Will follow up with patient.  Update 02/02/24: Patient states she has been sleeping a lot today, but was agreeable to call. Patient stating she is feeling a little better - denied s/s UTI, reported she continues with productive cough with brown sputum, states she had appt Monday 01/31/24 with oncology and saw PCP, Dr Ina today - TOC RN discussed diagnosis of new Afib from past hospitalization - Patient is seeing cardio, Dr Debarah 02/08/24 and is checking BP/HR and keeping log as directed related to medication changes with Metoprolol  up to 150mg  BID - educated patient to check BP prior to taking BP medication and notify provider if she has symptoms of decreased BP/HR for directions on management and any ordered medication changes - patient states she is weaning off Prozac and will be starting Trintellix. Patient states rash on her back has resolved - TOC RN educated patient to make sure all of her doctors are aware of the vitamins she takes - Patient denied pain at time of call and denied nausea/vomiting. Patient fell asleep a couple of times during call so we agreed to end the call - [facility] RN will review additional assessments in future call. Patient plans to spend Thanksgiving with her daughter.    COPD Interventions: Advised patient to track and manage COPD triggers Assessed social determinant of health barriers Discussed the importance of adequate rest and management of fatigue with COPD Provided instruction about proper use of medications used for management of COPD including inhalers Use of home oxygen  new on 3 liters - Education provided on oxygen  safety including no smoking in the home - oxygen  in use sign on door - and notifying  power company - patient asking about POC and was directed to discuss with provider as this requires an order   Oncology: Assessment of understanding of oncology diagnosis:  Reviewed upcoming provider appointments and treatment appointments Assessed available transportation to appointments and treatments. Has consistent/reliable transportation: Yes Assessed support system. Has consistent/reliable family or other support: Yes   AFIB Interventions:   Counseled on avoidance of NSAIDs due to increased bleeding risk with anticoagulants Counseled on importance of regular laboratory monitoring as prescribed Monitor heart rate and blood pressure at home and maintain a log Down the line there will be need to be another discussion in regards to starting anticoagulation therapy to reduce your risk of stroke   Patient Self Care Activities:  Attend all scheduled provider appointments Call pharmacy for medication refills 3-7 days in advance of running out of medications Call provider office for new concerns or questions  Notify RN Care Manager of TOC  call rescheduling needs Participate in Transition of Care Program/Attend TOC scheduled calls Take medications as prescribed   identify and remove indoor air pollutants limit outdoor activity during cold weather keep follow-up appointments: Pulmonary appointment scheduled with Dr Darlean and PFT 02/28/2024 Fluconazole  for the rash on your back UTI - Nitrofurantoin for four more days  Plan:  Telephone follow up appointment with care management team member scheduled for:  02/10/24 in the afternoon - patient has chemo 12/3, 12/4, 02/11/24 The patient has been provided with contact information for the care management team and has been advised to call with any health related questions or concerns.         Patient verbalizes understanding of instructions and care plan provided today and agrees to view in MyChart. Active MyChart status and patient understanding  of how to access instructions and care plan via MyChart confirmed with patient.     Telephone follow up appointment with care management team member scheduled for: 02/10/24 The patient has been provided with contact information for the care management team and has been advised to call with any health related questions or concerns.   Please call the care guide team at [phone] if you need to cancel or reschedule your appointment.   Please call the Suicide and Crisis Lifeline: 988 call 1-800-273-TALK (toll free, 24 hour hotline) call 911 if you are experiencing a Mental Health or Behavioral Health Crisis or need someone to talk to.  Shona Prow RN, CCM [place]  VBCI-Population Health RN Care Manager [phone]

## 2024-02-05 DIAGNOSIS — Z9981 Dependence on supplemental oxygen: Secondary | ICD-10-CM | POA: Diagnosis not present

## 2024-02-05 DIAGNOSIS — J209 Acute bronchitis, unspecified: Secondary | ICD-10-CM | POA: Diagnosis not present

## 2024-02-05 DIAGNOSIS — C801 Malignant (primary) neoplasm, unspecified: Secondary | ICD-10-CM | POA: Diagnosis not present

## 2024-02-05 DIAGNOSIS — C3411 Malignant neoplasm of upper lobe, right bronchus or lung: Secondary | ICD-10-CM | POA: Diagnosis not present

## 2024-02-05 DIAGNOSIS — J440 Chronic obstructive pulmonary disease with acute lower respiratory infection: Secondary | ICD-10-CM | POA: Diagnosis not present

## 2024-02-06 ENCOUNTER — Encounter: Payer: Self-pay | Admitting: Oncology

## 2024-02-07 ENCOUNTER — Telehealth: Payer: Self-pay

## 2024-02-07 NOTE — Telephone Encounter (Signed)
 Pt's daughter called in and spoke with [employer]. She reported to them that her mom had been having pain to left shoulder since last night, has been coughing for week, states has been having some productive cough in the last 4 days. States having SOB- hurts when she breathes since last night. Pain goes from chest to shoulders and neck. States all thermometers have been reading different but has had fever. Denies taking any antibiotics. She was told to go to ER for evaluation.  Pt went to [facility] ER and was treated for acute bronchitis - she was given Pred pak, tessalon pearles, Zpack, and told to continue her inhaler at home. CTA without acute findings including PE, PNA, pleural effusion. She was negative for flu, RSV, and COVID.

## 2024-02-08 ENCOUNTER — Ambulatory Visit: Admitting: Gastroenterology

## 2024-02-08 DIAGNOSIS — I5030 Unspecified diastolic (congestive) heart failure: Secondary | ICD-10-CM | POA: Diagnosis not present

## 2024-02-08 DIAGNOSIS — I480 Paroxysmal atrial fibrillation: Secondary | ICD-10-CM | POA: Diagnosis not present

## 2024-02-09 ENCOUNTER — Inpatient Hospital Stay: Attending: Oncology

## 2024-02-09 ENCOUNTER — Encounter: Payer: Self-pay | Admitting: Oncology

## 2024-02-09 ENCOUNTER — Telehealth: Payer: Self-pay | Admitting: Oncology

## 2024-02-09 ENCOUNTER — Inpatient Hospital Stay

## 2024-02-09 ENCOUNTER — Inpatient Hospital Stay: Admitting: Oncology

## 2024-02-09 VITALS — BP 124/62 | HR 74 | Temp 98.1°F | Resp 18 | Ht 65.0 in | Wt 207.3 lb

## 2024-02-09 DIAGNOSIS — D696 Thrombocytopenia, unspecified: Secondary | ICD-10-CM | POA: Diagnosis not present

## 2024-02-09 DIAGNOSIS — I871 Compression of vein: Secondary | ICD-10-CM | POA: Insufficient documentation

## 2024-02-09 DIAGNOSIS — C787 Secondary malignant neoplasm of liver and intrahepatic bile duct: Secondary | ICD-10-CM | POA: Insufficient documentation

## 2024-02-09 DIAGNOSIS — C3431 Malignant neoplasm of lower lobe, right bronchus or lung: Secondary | ICD-10-CM | POA: Diagnosis not present

## 2024-02-09 DIAGNOSIS — Z170 Estrogen receptor positive status [ER+]: Secondary | ICD-10-CM | POA: Diagnosis not present

## 2024-02-09 DIAGNOSIS — C3411 Malignant neoplasm of upper lobe, right bronchus or lung: Secondary | ICD-10-CM | POA: Insufficient documentation

## 2024-02-09 DIAGNOSIS — E871 Hypo-osmolality and hyponatremia: Secondary | ICD-10-CM | POA: Insufficient documentation

## 2024-02-09 DIAGNOSIS — D649 Anemia, unspecified: Secondary | ICD-10-CM | POA: Insufficient documentation

## 2024-02-09 DIAGNOSIS — Z923 Personal history of irradiation: Secondary | ICD-10-CM | POA: Insufficient documentation

## 2024-02-09 DIAGNOSIS — Z79899 Other long term (current) drug therapy: Secondary | ICD-10-CM | POA: Insufficient documentation

## 2024-02-09 DIAGNOSIS — Z853 Personal history of malignant neoplasm of breast: Secondary | ICD-10-CM | POA: Diagnosis not present

## 2024-02-09 DIAGNOSIS — Z5111 Encounter for antineoplastic chemotherapy: Secondary | ICD-10-CM | POA: Diagnosis present

## 2024-02-09 DIAGNOSIS — C3430 Malignant neoplasm of lower lobe, unspecified bronchus or lung: Secondary | ICD-10-CM

## 2024-02-09 LAB — CBC WITH DIFFERENTIAL (CANCER CENTER ONLY)
Abs Immature Granulocytes: 0.06 K/uL (ref 0.00–0.07)
Basophils Absolute: 0.1 K/uL (ref 0.0–0.1)
Basophils Relative: 1 %
Eosinophils Absolute: 0 K/uL (ref 0.0–0.5)
Eosinophils Relative: 0 %
HCT: 32.7 % — ABNORMAL LOW (ref 36.0–46.0)
Hemoglobin: 10.4 g/dL — ABNORMAL LOW (ref 12.0–15.0)
Immature Granulocytes: 1 %
Lymphocytes Relative: 16 %
Lymphs Abs: 1.2 K/uL (ref 0.7–4.0)
MCH: 30.1 pg (ref 26.0–34.0)
MCHC: 31.8 g/dL (ref 30.0–36.0)
MCV: 94.5 fL (ref 80.0–100.0)
Monocytes Absolute: 1 K/uL (ref 0.1–1.0)
Monocytes Relative: 13 %
Neutro Abs: 5.1 K/uL (ref 1.7–7.7)
Neutrophils Relative %: 69 %
Platelet Count: 473 K/uL — ABNORMAL HIGH (ref 150–400)
RBC: 3.46 MIL/uL — ABNORMAL LOW (ref 3.87–5.11)
RDW: 15.4 % (ref 11.5–15.5)
WBC Count: 7.4 K/uL (ref 4.0–10.5)
nRBC: 0 % (ref 0.0–0.2)

## 2024-02-09 LAB — CMP (CANCER CENTER ONLY)
ALT: 24 U/L (ref 0–44)
AST: 22 U/L (ref 15–41)
Albumin: 3.5 g/dL (ref 3.5–5.0)
Alkaline Phosphatase: 131 U/L — ABNORMAL HIGH (ref 38–126)
Anion gap: 9 (ref 5–15)
BUN: 19 mg/dL (ref 8–23)
CO2: 31 mmol/L (ref 22–32)
Calcium: 9.3 mg/dL (ref 8.9–10.3)
Chloride: 97 mmol/L — ABNORMAL LOW (ref 98–111)
Creatinine: 0.94 mg/dL (ref 0.44–1.00)
GFR, Estimated: >60 mL/min (ref >60)
Glucose, Bld: 126 mg/dL — ABNORMAL HIGH (ref 70–99)
Potassium: 4 mmol/L (ref 3.5–5.1)
Sodium: 137 mmol/L (ref 135–145)
Total Bilirubin: 0.3 mg/dL (ref 0.0–1.2)
Total Protein: 6.6 g/dL (ref 6.5–8.1)

## 2024-02-09 LAB — MAGNESIUM: Magnesium: 1.7 mg/dL (ref 1.7–2.4)

## 2024-02-09 MED ORDER — PALONOSETRON HCL INJECTION 0.25 MG/5ML
0.2500 mg | Freq: Once | INTRAVENOUS | Status: AC
  Administered 2024-02-09: 0.25 mg via INTRAVENOUS
  Filled 2024-02-09: qty 5

## 2024-02-09 MED ORDER — SODIUM CHLORIDE 0.9 % IV SOLN
1200.0000 mg | Freq: Once | INTRAVENOUS | Status: AC
  Administered 2024-02-09: 1200 mg via INTRAVENOUS
  Filled 2024-02-09: qty 20

## 2024-02-09 MED ORDER — SODIUM CHLORIDE 0.9% FLUSH: 10.0000 mL | INTRAVENOUS | Status: DC | PRN

## 2024-02-09 MED ORDER — SODIUM CHLORIDE 0.9 % IV SOLN: INTRAVENOUS | Status: DC

## 2024-02-09 MED ORDER — APREPITANT 130 MG/18ML IV EMUL
130.0000 mg | Freq: Once | INTRAVENOUS | Status: AC
  Administered 2024-02-09: 130 mg via INTRAVENOUS
  Filled 2024-02-09: qty 18

## 2024-02-09 MED ORDER — SODIUM CHLORIDE 0.9 % IV SOLN
430.0000 mg | Freq: Once | INTRAVENOUS | Status: AC
  Administered 2024-02-09: 430 mg via INTRAVENOUS
  Filled 2024-02-09: qty 43

## 2024-02-09 MED ORDER — DEXAMETHASONE SOD PHOSPHATE PF 10 MG/ML IJ SOLN
10.0000 mg | Freq: Once | INTRAMUSCULAR | Status: AC
  Administered 2024-02-09: 10 mg via INTRAVENOUS

## 2024-02-09 MED ORDER — SODIUM CHLORIDE 0.9 % IV SOLN
100.0000 mg/m2 | Freq: Once | INTRAVENOUS | Status: AC
  Administered 2024-02-09: 200 mg via INTRAVENOUS
  Filled 2024-02-09: qty 10

## 2024-02-09 MED FILL — Etoposide Inj 1 GM/50ML (20 MG/ML): INTRAVENOUS | Qty: 10 | Status: AC

## 2024-02-09 NOTE — Progress Notes (Signed)
 Keep dose of carboplatin  at 430mg  despite improvement in creatinine per Dr. Cornelius.  Patient ended up in hospital twice and had severe cytopenias with prior cycle.

## 2024-02-09 NOTE — Progress Notes (Signed)
 [facility]  [address] [place],  [place]  [address] [phone]  Clinic Day:  02/09/24   Referring physician: [name], MD  ASSESSMENT & PLAN:  Assessment:  Small Cell Lung Cancer This is newly diagnosed but found to be stage IV with liver metastases and significant bulky mediastinal adenopathy, right pleural based mass, and small right pleural effusion. She has been started on chemotherapy with Carboplatin , Etoposide , and Atezolizumab , with her first cycle given 4 weeks ago. She has had a rough time with cytopenias and ended up in the hospital twice but is over the worst. I reviewed all the plans with the patient her daughter and we plan close follow-up and supportive care with weekly labs and visits.   Superior Vena Cava Syndrome Due to this, palliative radiation was added and is helping. She had increased pain in the upper mediastinum earlier this week, but that has improved and she is tapering off the oxycodone. The edema of her arms has resolved and neck and face are greatly decreased.  Hyponatremia This is likely caused by SIADH from her small cell lung cancer and will be monitored closely. Her sodium is finally coming up now and was normal today for the first time at 137. I think it is responding to the treatment for her lung cancer.   Hypomagnesemia She has received IV magnesium . Her level is still mildly low at 1.6, so I will place her on oral supplement of magnesium  oxide 400 mg daily. It is up to 1.7 today.   Pneumonia This is multifocal and at least partially related to post obstruction of the right lower main bronchus. The increased sputum production may be a good sign that the right lower lobe bronchus is opening up.   Anemia This is likely related to her new small cell lung cancer and bilateral pneumonia. Her vitamin levels were all normal. Her hemoglobin dropped further to 7.6 on 01/19/2024 and remained that today. She was given 1 unit of PRBCs  today.  Neutropenia She has profound neutropenia with an ANC down to 100, but this is her nadir and she did not receive Neulasta . I have reviewed neutropenic precautions with her and her daughter, and her counts did recover. We will be able to use Neulasta  with this second cycle.   Thrombocytopenia Her platelet count is not too low, but has gone below 100,000 and she has had some nosebleeds. We will continue to monitor.  History of Stage IA (T1b N0 M0) hormone receptor positive right breast cancer This was diagnosed in April, 2005. She was treated with a lumpectomy and pathology revealed a 6mm, grade 3, invasive ductal carcinoma with sentinel node positive with a 3mm micrometastasis. Estrogen and progesterone receptors were positive and her 2 neu negative. She was treated with 4 cycles of doxorubicin and cyclophosphamide. She was then placed on Anastrozole in November, 2005 and took that for 3.94yrs, but was switched to Tamoxifen in April, 2009 because of her arthralgias related to Anastrozole. She completed 5 years of adjuvant hormonal therapy.    History of Stage IA (T1b N0 M0) hormone receptor positive left breast cancer This was diagnosed in July, 2015. This was treated with lumpectomy and pathology revealed a 6mm, grade 1, ductal carcinoma with negative sentinel node. Estrogen and progesterone receptors were positive and her 2 neu negative and Ki 67 was 18%. She received adjuvant radiation to the left breast and was then placed on Anastrozole again in August, 2015. This was stopped  in October, 2016 due to severe hot flashes, anxiety, and depression, mild cognitive difficulty, trouble finding words, and remembering things as well as becoming easily overwhelmed. She did not tolerate venlafaxine and was placed on paroxetine 20mg  daily in September, 2015 and this improved her symptoms. She was later changed to Lexapro. She was placed on Tamoxifen 20 mg daily for adjuvant hormonal therapy for 5 years.    Plan: Patient states that she feels better but was seen in the ER on 02/05/2024 with cough and shortness of breath. Evaluation felt this was acute bronchitis and she was placed on azithromycin  250mg  daily for 5 days and prednisone  two pills daily for 5 days along with tessalon perles, along with oxycodone 5mg . She tells me she does feel better but is still cough with occasional dark sputum and thick greenish phlegm. She is finishing her medications from the ER and feels she is ready for her second cycle of chemotherapy as planned. We had already delayed one week due to her hospitalization in mid November. Her sister questioned why her radiation was stopped 2 days early and I explained that it had accomplished treatment of her SVC syndrome and she was hospitalized at the time she was due for the last 2 doses. We also prefer to give her Neulasta  which was held for her first cycle due to the radiation and she did end up with 2 hospitalizations and cytopenias. Her day 1 cycle 2 of carboplatin , atezolizumab , and atoposide is scheduled on 02/09/2024. She will receive an Neulasta  injection on 02/14/2024. I advised her to take Claritin before this injection. She has a WBC of 7.4, low hemoglobin of 10.4 improved from 9.9, and elevated platelet count of 473,000. Her magnesium  is normal at 1.7 and her CMP is normal other than an elevated alkaline phosphatase of 131 improved from 146. She continues oral magnesium  once daily. She will return weekly with CBC, CMP, and magnesium  levels. She will return on the 22nd for her next cycle. I discussed the assessment and treatment plan with the patient.  The patient was provided an opportunity to ask questions and all were answered.  The patient agreed with the plan and demonstrated an understanding of the instructions.  The patient was advised to call back if the symptoms worsen or if the condition fails to improve as anticipated.  I provided 16 minutes of face-to-face time  during this this encounter and > 50% was spent counseling as documented under my assessment and plan.   [name], MD [facility] - A DEPT OF [facility] [address] [place] [address] Dept: [phone] Dept Fax: [phone]   No orders of the defined types were placed in this encounter.  CHIEF COMPLAINT:  CC: Small Cell Lung Cancer  Current Treatment:  Chemotherapy and Immunotherapy   HISTORY OF PRESENT ILLNESS:  Heather Chavez is a 78 y.o. female with a history of bilateral breast cancer who is referred in consultation by Dr. [name] for assessment and management of newly diagnosed small cell lung cancer. She has a history of stage 1A right breast cancer 06/2003 (s/p lumpectomy, doxorubicin + cyclophosphamide, and 5 years of adjuvant hormonal therapy with tamoxifen/anastrozole), stage 1A left breast cancer (s/p lumpectomy, adjuvant radiation, and 5 years of adjuvant hormonal therapy with tamoxifen/anastrozole), COPD/asthma overlap, and tobacco use.  She underwent testing for hereditary breast and ovarian cancer with the [employer] OvaNext 25 gene panel, which did not  reveal any clinically significant mutation. She did have a variant of uncertain significance of MSH2. She had a previous hysterectomy and unilateral oophorectomy for benign disease at a fairly young age, so does not require pap smears. Screening colonoscopy in February, 2017 revealed multiple polyps, all of which were benign polypoid colonic mucosa.  Kathlyn was admitted into the [facility] ED on October 20th, 2025 and presented with shortness of breath, fevers, and chills. She received a chest x-ray for further evaluation which revealed an right upper lobe infiltrate versus mass and chest CT angio revealed emphysema with chronic interstitial changes in both lung fields corresponding with multifocal pneumonitis and a suspicious pleural based mass  in the right upper lobe measuring 3.9cm, and multiple suspicious enlarged mediastinal lymph nodes.  She was diagnosed with mass of the right lung, acute hypoxic respiratory failure, and bilateral community acquired pneumonia before being transferred to [facility]. She then had bronchoscopy and biopsy and pathology report was positive for small cell carcinoma with positive tumor cells for TTF-1 and neuroendocrine markers CD56, synaptophysin, and chromogranin. GATA-3 is negative and Pan keratin showed dot-like positive staining. She comes to me for discussion of staging and treatment and had a PET scan and brain MRI today but results are pending. I recommend systemic chemotherapy with Carboplatin  and Etopiside. She will also have immunotherapy, either concurrent or sequential depending on her staging. She will have her chemoeducation tomorrow and port placed this Friday.   I have reviewed her chart and materials related to her cancer extensively and collaborated history with the patient. Summary of oncologic history is as follows: Oncology History  Lung cancer, lower lobe (HCC)  01/05/2024 Initial Diagnosis   Lung cancer, lower lobe (HCC)   01/06/2024 Cancer Staging   Staging form: Lung, AJCC V9 - Clinical stage from 01/06/2024: Stage IVB (cT4, cN2b, cM1c1) - Signed by Cornelius Wanda DEL, MD on 01/06/2024 Histopathologic type: Small cell carcinoma, NOS Stage prefix: Initial diagnosis Method of lymph node assessment: Clinical IASLC grade: G3 Histologic grading system: 3 grade system Laterality: Right Tumor size (mm): 56 Sites of metastasis: Liver Lymph-vascular invasion (LVI): Presence of LVI unknown/indeterminate Diagnostic confirmation: Positive histology Specimen type: Bronchial Biopsy Staged by: Managing physician Standard uptake value (SUV): 12.5 Perineural invasion (PNI): Unknown Pleural/elastic layer invasion: Unknown Stage used in treatment planning: Yes National  guidelines used in treatment planning: Yes Type of national guideline used in treatment planning: NCCN   01/10/2024 -  Chemotherapy   Patient is on Treatment Plan : LUNG SCLC Carboplatin  + Etoposide  + Atezolizumab  Induction q21d x 4 cycles / Atezolizumab  Maintenance q21d      INTERVAL HISTORY:  Monchel is seen in the clinic for follow up of her newly diagnosed small cell carcinoma. Patient states that she feels better but was seen in the ER on 02/05/2024 with cough and shortness of breath. Evaluation felt this was acute bronchitis and she was placed on azithromycin  250mg  daily for 5 days and prednisone  two pills daily for 5 days along with tessalon perles, along with oxycodone 5mg . She tells me she does feel better but is still cough with occasional dark sputum and thick greenish phlegm. She is finishing her medications from the ER and feels she is ready for her second cycle of chemotherapy as planned. We had already delayed one week due to her hospitalization in mid November. Her sister questioned why her radiation was stopped 2 days early and I explained that it had accomplished treatment of her SVC syndrome and  she was hospitalized at the time she was due for the last 2 doses. We also prefer to give her Neulasta  which was held for her first cycle due to the radiation and she did end up with 2 hospitalizations and cytopenias. Her day 1 cycle 2 of carboplatin , atezolizumab , and atoposide is scheduled on 02/09/2024. She will receive an Neulasta  injection on 02/14/2024. I advised her to take Claritin before this injection. She has a WBC of 7.4, low hemoglobin of 10.4 improved from 9.9, and elevated platelet count of 473,000. Her magnesium  is normal at 1.7 and her CMP is normal other than an elevated alkaline phosphatase of 131 improved from 146. She continues oral magnesium  once daily. She will return weekly with CBC, CMP, and magnesium  levels. She will return on the 22nd for her next cycle.  She denies  fever, chills, night sweats, or other signs of infection. She denies cardiorespiratory and gastrointestinal issues. She  denies pain. Her appetite is good and Her weight has decreased 1 pounds over last week. This patient is accompanied in the office by her sister.  HISTORY:   Past Medical History:  Diagnosis Date   Advanced nonexudative age-related macular degeneration of right eye with subfoveal involvement 09/21/2019   Advanced nonexudative age-related macular degeneration of right eye without subfoveal involvement 09/21/2019   Allergic rhinitis    Angina pectoris 05/17/2020   Bilateral breast cancer (HCC) 11/23/2013   BMI 34.0-34.9,adult 09/25/2019   Breast cancer (HCC)    Bronchitis, chronic (HCC)    CAD (coronary artery disease)    Cancer (HCC)    Cardiac murmur 05/17/2020   Coronary artery calcification 05/17/2020   Degenerative retinal drusen of right eye 09/21/2019   Diabetes mellitus due to underlying condition with unspecified complications (HCC) 05/17/2020   Diverticulosis    Dry eyes 06/25/2016   Dyspnea    Emphysema/COPD    Essential hypertension 05/17/2020   Estrogen receptor positive neoplasm 09/24/2015   Family history of malignant neoplasm of breast 11/23/2013   Family history of peritoneal cancer 11/23/2013   Fuchs' corneal dystrophy 11/03/2016   GERD (gastroesophageal reflux disease)    History of colon polyps    History of right breast cancer 09/19/2018   Hypertension    IBS (irritable bowel syndrome)    with D   Intermediate stage nonexudative age-related macular degeneration of left eye 09/21/2019   Iritis 11/03/2016   Macular degeneration    Major depressive disorder    Malignant neoplasm of upper-inner quadrant of left breast in female, estrogen receptor positive (HCC) 09/01/2016   Mixed dyslipidemia 05/17/2020   Obesity    Obesity (BMI 35.0-39.9 without comorbidity) 05/17/2020   OSA on CPAP 10/29/2020   Diagnosed some 3 months prior after  pneumonia episode, Dr. Shellia pulmonology   Peripheral vascular disease    Personal history of chemotherapy    Personal history of radiation therapy    Pseudophakia 09/21/2019   Right-sided chest wall pain    SVT (supraventricular tachycardia) 05/23/2020   Varicose veins of left lower extremity with complications 08/11/2016   Past Surgical History:  Procedure Laterality Date   BREAST BIOPSY     BREAST LUMPECTOMY     right brwast 2005/ left 2015   CATARACT EXTRACTION     CHOLECYSTECTOMY     COLONOSCOPY  04/17/2015   Colonic polyps status post polypectomy. Moderate to severe sigmoid diverticulosis   ENDOVENOUS ABLATION SAPHENOUS VEIN W/ LASER Right 02/19/2023   endovenous laser ablation right greater saphenous vein and  stab phlebectomy 10-20 incisions right leg by Gaile New MD   TUBAL LIGATION     VAGINAL HYSTERECTOMY     VEIN LIGATION AND STRIPPING     Family History  Problem Relation Age of Onset   Cancer Mother 80       bilateral breast cancer ages 70 then 32. Peritoneal cancer at age 39.   Other Mother        Ms. Zinni's mother was adopted so have no information regarding maternal family  history other than her mother   Breast cancer Mother    Cancer Father 23       renal   Cancer Sister 30       breast   Breast cancer Sister    Breast cancer Daughter    Breast cancer Maternal Aunt    Breast cancer Paternal Aunt    Breast cancer Paternal Grandmother    Cancer Paternal Grandmother        bilateral breast cancer at unknown age   Social History:  reports that she quit smoking about 42 years ago. Her smoking use included cigarettes. She started smoking about 57 years ago. She has a 37.5 pack-year smoking history. She has never used smokeless tobacco. She reports current alcohol use. She reports that she does not use drugs.The patient is accompanied by her daughter and granddaughter today.  Allergies:  Allergies  Allergen Reactions   Codeine Nausea And Vomiting and  Other (See Comments)    Other reaction(s): Other (see comments), Vomiting   Levofloxacin Other (See Comments)    Leg cramps   Nexlizet  [Bempedoic Acid-Ezetimibe ] Other (See Comments)   Pitavastatin  Other (See Comments)    Leg cramps  Other Reaction(s): Other (See Comments)  Leg cramps    Leg cramps   Rosuvastatin  Other (See Comments)    Muscle pain    Current Medications: Current Outpatient Medications  Medication Sig Dispense Refill   FLUoxetine (PROZAC) 10 MG capsule Take 10 mg by mouth daily. (Patient taking differently: Take 10 mg by mouth every other day.)     albuterol  (VENTOLIN  HFA) 108 (90 Base) MCG/ACT inhaler Inhale 2 puffs into the lungs every 4 (four) hours as needed for wheezing.     allopurinol  (ZYLOPRIM ) 300 MG tablet Take 1 tablet (300 mg total) by mouth daily. 30 tablet 0   Biotin 89999 MCG TABS Take 10,000 mcg by mouth daily.     budesonide -formoterol  (SYMBICORT ) 80-4.5 MCG/ACT inhaler Inhale 2 puffs into the lungs 2 (two) times daily.     chlorpheniramine-HYDROcodone  (TUSSIONEX) 10-8 MG/5ML Take 5 mLs by mouth every 12 (twelve) hours as needed for cough. 473 mL 0   diltiazem  (CARDIZEM  CD) 120 MG 24 hr capsule Take 120 mg by mouth daily.     fexofenadine (ALLEGRA) 180 MG tablet Take 180 mg by mouth.     fluocinonide cream (LIDEX) 0.05 % Apply 1 Application topically 3 (three) times daily. (Patient not taking: Reported on 02/02/2024)     furosemide (LASIX) 40 MG tablet Take 40 mg by mouth.     gabapentin (NEURONTIN) 600 MG tablet Take 600 mg by mouth 2 (two) times daily.     GARLIC PO Take 1 tablet by mouth daily.     ipratropium-albuterol  (DUONEB) 0.5-2.5 (3) MG/3ML SOLN Take 3 mLs by nebulization every 4 (four) hours as needed (breathing treatment).     LORazepam  (ATIVAN ) 0.5 MG tablet Take by mouth.     magic mouthwash w/lidocaine  SOLN Take 5 mLs by mouth  4 (four) times daily. Suspension contains equal amounts of Maalox Extra Strength, nystatin , diphenhydramine  and  lidocaine .     magnesium  oxide (MAG-OX) 400 (240 Mg) MG tablet Take 1 tablet (400 mg total) by mouth daily. 30 tablet 5   metFORMIN (GLUCOPHAGE) 500 MG tablet Take 500 mg by mouth daily.     metoprolol  tartrate (LOPRESSOR ) 100 MG tablet Take 150 mg by mouth 2 (two) times daily.     montelukast (SINGULAIR) 10 MG tablet Take 10 mg by mouth at bedtime.     Multiple Vitamins-Minerals (PRESERVISION AREDS 2 PO) Take by mouth.     nitrofurantoin (MACRODANTIN) 100 MG capsule Take 100 mg by mouth 2 (two) times daily.     nitroGLYCERIN  (NITROSTAT ) 0.4 MG SL tablet Place 0.4 mg under the tongue every 5 (five) minutes as needed for chest pain.     nystatin  (MYCOSTATIN /NYSTOP ) powder Apply 1 Application topically 3 (three) times daily. 15 g 0   ondansetron  (ZOFRAN ) 4 MG tablet Take 1 tablet (4 mg total) by mouth every 4 (four) hours as needed for nausea. 90 tablet 3   OXYGEN  Inhale 2.5 L/min into the lungs continuous.     pantoprazole  (PROTONIX ) 40 MG tablet Take 1 tablet (40 mg total) by mouth daily. Call [phone] to schedule an office visit for more refills (Patient taking differently: Take 40 mg by mouth 2 (two) times daily. Call [phone] to schedule an office visit for more refills) 90 tablet 0   potassium chloride  SA (KLOR-CON  M) 20 MEQ tablet Take 1 tablet (20 mEq total) by mouth daily. 30 tablet 5   prochlorperazine  (COMPAZINE ) 10 MG tablet Take 1 tablet (10 mg total) by mouth every 6 (six) hours as needed for nausea or vomiting. 90 tablet 3   vitamin E 180 MG (400 UNITS) capsule Take 400 Units by mouth daily.     zinc gluconate 50 MG tablet Take 50 mg by mouth daily.     No current facility-administered medications for this visit.   Facility-Administered Medications Ordered in Other Visits  Medication Dose Route Frequency Provider Last Rate Last Admin   0.9 %  sodium chloride  infusion   Intravenous Continuous Cornelius Wanda DEL, MD   Stopped at 02/09/24 1607   sodium chloride  flush (NS)  0.9 % injection 10 mL  10 mL Intracatheter PRN Cornelius Wanda DEL, MD        REVIEW OF SYSTEMS:  Review of Systems  Constitutional: Negative.  Negative for appetite change, chills, diaphoresis, fatigue, fever and unexpected weight change.  HENT:   Positive for nosebleeds. Negative for hearing loss, lump/mass, mouth sores, sore throat, tinnitus, trouble swallowing and voice change.   Eyes:  Positive for eye problems (vision changes).  Respiratory:  Positive for cough and shortness of breath. Negative for chest tightness, hemoptysis and wheezing.   Cardiovascular:  Positive for leg swelling. Negative for chest pain and palpitations.  Gastrointestinal:  Negative for abdominal distention, abdominal pain, blood in stool, constipation, diarrhea, nausea, rectal pain and vomiting.       Bowel incontinence upon straining  Endocrine: Negative.   Genitourinary:  Positive for bladder incontinence (upon straining). Negative for difficulty urinating, dyspareunia, dysuria, frequency, hematuria, menstrual problem, nocturia, pelvic pain, vaginal bleeding and vaginal discharge.   Musculoskeletal: Negative.  Negative for arthralgias, back pain, flank pain, gait problem, myalgias, neck pain and neck stiffness.  Skin: Negative.  Negative for itching, rash and wound.  Neurological:  Positive for headaches and numbness (neuropathy of bilateral feet). Negative  for dizziness, extremity weakness, gait problem, light-headedness, seizures and speech difficulty.  Hematological:  Negative for adenopathy. Bruises/bleeds easily.  Psychiatric/Behavioral: Negative.  Negative for depression and sleep disturbance. The patient is not nervous/anxious.    VITALS:  Blood pressure 124/62, pulse 74, temperature 98.1 F (36.7 C), temperature source Oral, resp. rate 18, height 5' 5 (1.651 m), weight 207 lb 4.8 oz (94 kg), SpO2 97%.  Wt Readings from Last 3 Encounters:  02/09/24 207 lb 4.8 oz (94 kg)  02/02/24 205 lb (93 kg)   01/31/24 208 lb 4.8 oz (94.5 kg)    Body mass index is 34.5 kg/m.  Performance status (ECOG): 1 - Symptomatic but completely ambulatory  PHYSICAL EXAM:  Physical Exam Vitals and nursing note reviewed. Exam conducted with a chaperone present.  Constitutional:      General: She is not in acute distress.    Appearance: Normal appearance. She is normal weight. She is not ill-appearing, toxic-appearing or diaphoretic.  HENT:     Head: Normocephalic and atraumatic.     Right Ear: Tympanic membrane, ear canal and external ear normal. There is no impacted cerumen.     Left Ear: Tympanic membrane, ear canal and external ear normal. There is no impacted cerumen.     Nose: Nose normal. No congestion or rhinorrhea.     Mouth/Throat:     Mouth: Mucous membranes are moist.     Pharynx: Oropharynx is clear. No oropharyngeal exudate or posterior oropharyngeal erythema.  Eyes:     General: No scleral icterus.       Right eye: No discharge.        Left eye: No discharge.     Extraocular Movements: Extraocular movements intact.     Conjunctiva/sclera: Conjunctivae normal.     Pupils: Pupils are equal, round, and reactive to light.  Cardiovascular:     Rate and Rhythm: Normal rate and regular rhythm.     Pulses: Normal pulses.     Heart sounds: Normal heart sounds. No murmur heard.    No friction rub. No gallop.  Pulmonary:     Effort: Pulmonary effort is normal. No respiratory distress.     Breath sounds: Examination of the right-lower field reveals decreased breath sounds. Decreased breath sounds present.  Abdominal:     General: Bowel sounds are normal. There is no distension.     Palpations: Abdomen is soft. There is hepatomegaly (mild). There is no splenomegaly or mass.     Tenderness: There is no abdominal tenderness. There is no right CVA tenderness, left CVA tenderness, guarding or rebound.     Hernia: No hernia is present.  Musculoskeletal:        General: Normal range of motion.      Cervical back: Normal range of motion and neck supple.     Right lower leg: Edema (1-2+) present.     Left lower leg: Edema (1-2+) present.     Comments: Bilateral lower leg 1-2+ edema  Lymphadenopathy:     Cervical: No cervical adenopathy.     Right cervical: No superficial, deep or posterior cervical adenopathy.    Left cervical: No superficial, deep or posterior cervical adenopathy.     Upper Body:     Right upper body: No supraclavicular, axillary or pectoral adenopathy.     Left upper body: No supraclavicular, axillary or pectoral adenopathy.  Skin:    General: Skin is warm and dry.  Neurological:     General: No focal deficit present.  Mental Status: She is alert and oriented to person, place, and time. Mental status is at baseline.  Psychiatric:        Mood and Affect: Mood normal.        Behavior: Behavior normal.        Thought Content: Thought content normal.        Judgment: Judgment normal.    LABS:      Latest Ref Rng & Units 02/09/2024   10:06 AM 01/31/2024   12:49 PM 01/21/2024   11:14 AM  CBC  WBC 4.0 - 10.5 K/uL 7.4  8.4  0.7   Hemoglobin 12.0 - 15.0 g/dL 89.5  9.9  7.6   Hematocrit 36.0 - 46.0 % 32.7  30.9  23.1   Platelets 150 - 400 K/uL 473  260  94       Latest Ref Rng & Units 02/09/2024   10:06 AM 01/31/2024   12:49 PM 01/21/2024   11:14 AM  CMP  Glucose 70 - 99 mg/dL 873  893  861   BUN 8 - 23 mg/dL 19  14  17    Creatinine 0.44 - 1.00 mg/dL 9.05  9.25  9.16   Sodium 135 - 145 mmol/L 137  134  130   Potassium 3.5 - 5.1 mmol/L 4.0  4.1  4.3   Chloride 98 - 111 mmol/L 97  94  91   CO2 22 - 32 mmol/L 31  30  28    Calcium  8.9 - 10.3 mg/dL 9.3  9.4  9.4   Total Protein 6.5 - 8.1 g/dL 6.6  6.7  6.5   Total Bilirubin 0.0 - 1.2 mg/dL 0.3  0.3  0.4   Alkaline Phos 38 - 126 U/L 131  146  120   AST 15 - 41 U/L 22  30  41   ALT 0 - 44 U/L 24  30  74      Lab Results  Component Value Date   CEA 11.87 (H) 01/06/2024   /  CEA ([facility])  Date Value  Ref Range Status  01/06/2024 11.87 (H) 0.00 - 5.00 ng/mL Final    Comment:    (NOTE) This test was performed using Beckman Coulter's paramagnetic chemiluminescent immunoassay. Values obtained from different assay methods cannot be used interchangeably. Please note that up to 8% of patients who smoke may see values 5.1-10.0 ng/ml and 1% of patients who smoke may see CEA levels >10.0 ng/ml. Performed at [facility], [address] [place], [place] [address]    No results found for: PSA1 No results found for: CAN199 No results found for: CAN125  No results found for: STEPHANY RINGS, A1GS, A2GS, BETS, BETA2SER, GAMS, MSPIKE, SPEI Lab Results  Component Value Date   TIBC 316 01/10/2024   FERRITIN 297 01/10/2024   IRONPCTSAT 27 01/10/2024   No results found for: LDH  STUDIES:  EXAM: 01/05/2024 MRI HEAD WITH WITHOUT CM IMPRESSION: Relatively age-appropriate atrophy and mall-vessel disease  EXAM: 01/05/2024 PET SKULL BASE TO MID-THIGH IMPRESSION: Irregular solid noncalcified peripheral right upper lobe mass likely primary neoplasm. Extensive mediastinal and right hilar metastatic adenopathy. Extensive hepatic metastatic disease. Probable single metastatic lesion in the left proximal femur. No definitive adrenal metastatic disease.   Exam: 12/29/2023 Pathology Impression: Lung, right upper lobe, needle core biopsy: Small Cell Carcinoma The tumor cells are positive for TTF-1 and neuroendocrine markers CD56, synaptophysin, and chromogranin. GATA-3 is negative, Pan keratin shows dot-like positive staining.   I,Jasmine M Lassiter,acting as  a scribe for [name], MD.,have documented all relevant documentation on the behalf of [name], MD,as directed by  [name], MD while in the presence of [name], MD.  I have reviewed this report as typed by the medical scribe, and it is  complete and accurate.

## 2024-02-09 NOTE — Telephone Encounter (Signed)
 Patient has been scheduled for follow-up visit per 02/09/2024 LOS.  Pt given an appt calendar with date and time.

## 2024-02-10 ENCOUNTER — Inpatient Hospital Stay

## 2024-02-10 VITALS — BP 113/67 | HR 91 | Temp 97.9°F | Resp 20 | Ht 65.0 in | Wt 209.0 lb

## 2024-02-10 DIAGNOSIS — C3430 Malignant neoplasm of lower lobe, unspecified bronchus or lung: Secondary | ICD-10-CM

## 2024-02-10 DIAGNOSIS — Z5111 Encounter for antineoplastic chemotherapy: Secondary | ICD-10-CM | POA: Diagnosis not present

## 2024-02-10 MED ORDER — SODIUM CHLORIDE 0.9 % IV SOLN: INTRAVENOUS | Status: DC

## 2024-02-10 MED ORDER — DEXAMETHASONE SOD PHOSPHATE PF 10 MG/ML IJ SOLN
10.0000 mg | Freq: Once | INTRAMUSCULAR | Status: AC
  Administered 2024-02-10: 10 mg via INTRAVENOUS

## 2024-02-10 MED ORDER — SODIUM CHLORIDE 0.9 % IV SOLN
100.0000 mg/m2 | Freq: Once | INTRAVENOUS | Status: AC
  Administered 2024-02-10: 200 mg via INTRAVENOUS
  Filled 2024-02-10: qty 10

## 2024-02-10 MED FILL — Etoposide Inj 1 GM/50ML (20 MG/ML): INTRAVENOUS | Qty: 10 | Status: AC

## 2024-02-10 NOTE — Patient Instructions (Addendum)
Etoposide Injection What is this medication? ETOPOSIDE (e toe POE side) treats some types of cancer. It works by slowing down the growth of cancer cells. This medicine may be used for other purposes; ask your health care provider or pharmacist if you have questions. COMMON BRAND NAME(S): Etopophos, Toposar, VePesid What should I tell my care team before I take this medication? They need to know if you have any of these conditions: Infection Kidney disease Liver disease Low blood counts, such as low white cell, platelet, red cell counts An unusual or allergic reaction to etoposide, other medications, foods, dyes, or preservatives If you or your partner are pregnant or trying to get pregnant Breastfeeding How should I use this medication? This medication is injected into a vein. It is given by your care team in a hospital or clinic setting. Talk to your care team about the use of this medication in children. Special care may be needed. Overdosage: If you think you have taken too much of this medicine contact a poison control center or emergency room at once. NOTE: This medicine is only for you. Do not share this medicine with others. What if I miss a dose? Keep appointments for follow-up doses. It is important not to miss your dose. Call your care team if you are unable to keep an appointment. What may interact with this medication? Warfarin This list may not describe all possible interactions. Give your health care provider a list of all the medicines, herbs, non-prescription drugs, or dietary supplements you use. Also tell them if you smoke, drink alcohol, or use illegal drugs. Some items may interact with your medicine. What should I watch for while using this medication? Your condition will be monitored carefully while you are receiving this medication. This medication may make you feel generally unwell. This is not uncommon as chemotherapy can affect healthy cells as well as cancer  cells. Report any side effects. Continue your course of treatment even though you feel ill unless your care team tells you to stop. This medication can cause serious side effects. To reduce the risk, your care team may give you other medications to take before receiving this one. Be sure to follow the directions from your care team. This medication may increase your risk of getting an infection. Call your care team for advice if you get a fever, chills, sore throat, or other symptoms of a cold or flu. Do not treat yourself. Try to avoid being around people who are sick. This medication may increase your risk to bruise or bleed. Call your care team if you notice any unusual bleeding. Talk to your care team about your risk of cancer. You may be more at risk for certain types of cancers if you take this medication. Talk to your care team if you may be pregnant. Serious birth defects can occur if you take this medication during pregnancy and for 6 months after the last dose. You will need a negative pregnancy test before starting this medication. Contraception is recommended while taking this medication and for 6 months after the last dose. Your care team can help you find the option that works for you. If your partner can get pregnant, use a condom during sex while taking this medication and for 4 months after the last dose. Do not breastfeed while taking this medication. This medication may cause infertility. Talk to your care team if you are concerned about your fertility. What side effects may I notice from receiving this medication?  Side effects that you should report to your care team as soon as possible: Allergic reactions--skin rash, itching, hives, swelling of the face, lips, tongue, or throat Infection--fever, chills, cough, sore throat, wounds that don't heal, pain or trouble when passing urine, general feeling of discomfort or being unwell Low red blood cell level--unusual weakness or fatigue,  dizziness, headache, trouble breathing Unusual bruising or bleeding Side effects that usually do not require medical attention (report to your care team if they continue or are bothersome): Diarrhea Fatigue Hair loss Loss of appetite Nausea Vomiting This list may not describe all possible side effects. Call your doctor for medical advice about side effects. You may report side effects to FDA at 1-800-FDA-1088. Where should I keep my medication? This medication is given in a hospital or clinic. It will not be stored at home. NOTE: This sheet is a summary. It may not cover all possible information. If you have questions about this medicine, talk to your doctor, pharmacist, or health care provider.  2024 Elsevier/Gold Standard (2021-07-17 00:00:00)

## 2024-02-11 ENCOUNTER — Inpatient Hospital Stay

## 2024-02-11 VITALS — BP 133/75 | HR 76 | Temp 98.2°F | Resp 20

## 2024-02-11 DIAGNOSIS — Z5111 Encounter for antineoplastic chemotherapy: Secondary | ICD-10-CM | POA: Diagnosis not present

## 2024-02-11 DIAGNOSIS — C3430 Malignant neoplasm of lower lobe, unspecified bronchus or lung: Secondary | ICD-10-CM

## 2024-02-11 MED ORDER — DEXAMETHASONE SOD PHOSPHATE PF 10 MG/ML IJ SOLN
10.0000 mg | Freq: Once | INTRAMUSCULAR | Status: AC
  Administered 2024-02-11: 10 mg via INTRAVENOUS

## 2024-02-11 MED ORDER — SODIUM CHLORIDE 0.9 % IV SOLN: INTRAVENOUS | Status: DC

## 2024-02-11 MED ORDER — PEGFILGRASTIM INF DEV 6 MG/0.6ML ~~LOC~~ SOSY
6.0000 mg | PREFILLED_SYRINGE | Freq: Once | SUBCUTANEOUS | Status: AC
  Administered 2024-02-11: 6 mg via SUBCUTANEOUS

## 2024-02-11 MED ORDER — SODIUM CHLORIDE 0.9 % IV SOLN
100.0000 mg/m2 | Freq: Once | INTRAVENOUS | Status: AC
  Administered 2024-02-11: 200 mg via INTRAVENOUS
  Filled 2024-02-11: qty 10

## 2024-02-11 NOTE — Patient Instructions (Signed)
Etoposide Injection What is this medication? ETOPOSIDE (e toe POE side) treats some types of cancer. It works by slowing down the growth of cancer cells. This medicine may be used for other purposes; ask your health care provider or pharmacist if you have questions. COMMON BRAND NAME(S): Etopophos, Toposar, VePesid What should I tell my care team before I take this medication? They need to know if you have any of these conditions: Infection Kidney disease Liver disease Low blood counts, such as low white cell, platelet, red cell counts An unusual or allergic reaction to etoposide, other medications, foods, dyes, or preservatives If you or your partner are pregnant or trying to get pregnant Breastfeeding How should I use this medication? This medication is injected into a vein. It is given by your care team in a hospital or clinic setting. Talk to your care team about the use of this medication in children. Special care may be needed. Overdosage: If you think you have taken too much of this medicine contact a poison control center or emergency room at once. NOTE: This medicine is only for you. Do not share this medicine with others. What if I miss a dose? Keep appointments for follow-up doses. It is important not to miss your dose. Call your care team if you are unable to keep an appointment. What may interact with this medication? Warfarin This list may not describe all possible interactions. Give your health care provider a list of all the medicines, herbs, non-prescription drugs, or dietary supplements you use. Also tell them if you smoke, drink alcohol, or use illegal drugs. Some items may interact with your medicine. What should I watch for while using this medication? Your condition will be monitored carefully while you are receiving this medication. This medication may make you feel generally unwell. This is not uncommon as chemotherapy can affect healthy cells as well as cancer  cells. Report any side effects. Continue your course of treatment even though you feel ill unless your care team tells you to stop. This medication can cause serious side effects. To reduce the risk, your care team may give you other medications to take before receiving this one. Be sure to follow the directions from your care team. This medication may increase your risk of getting an infection. Call your care team for advice if you get a fever, chills, sore throat, or other symptoms of a cold or flu. Do not treat yourself. Try to avoid being around people who are sick. This medication may increase your risk to bruise or bleed. Call your care team if you notice any unusual bleeding. Talk to your care team about your risk of cancer. You may be more at risk for certain types of cancers if you take this medication. Talk to your care team if you may be pregnant. Serious birth defects can occur if you take this medication during pregnancy and for 6 months after the last dose. You will need a negative pregnancy test before starting this medication. Contraception is recommended while taking this medication and for 6 months after the last dose. Your care team can help you find the option that works for you. If your partner can get pregnant, use a condom during sex while taking this medication and for 4 months after the last dose. Do not breastfeed while taking this medication. This medication may cause infertility. Talk to your care team if you are concerned about your fertility. What side effects may I notice from receiving this medication?  Side effects that you should report to your care team as soon as possible: Allergic reactions--skin rash, itching, hives, swelling of the face, lips, tongue, or throat Infection--fever, chills, cough, sore throat, wounds that don't heal, pain or trouble when passing urine, general feeling of discomfort or being unwell Low red blood cell level--unusual weakness or fatigue,  dizziness, headache, trouble breathing Unusual bruising or bleeding Side effects that usually do not require medical attention (report to your care team if they continue or are bothersome): Diarrhea Fatigue Hair loss Loss of appetite Nausea Vomiting This list may not describe all possible side effects. Call your doctor for medical advice about side effects. You may report side effects to FDA at 1-800-FDA-1088. Where should I keep my medication? This medication is given in a hospital or clinic. It will not be stored at home. NOTE: This sheet is a summary. It may not cover all possible information. If you have questions about this medicine, talk to your doctor, pharmacist, or health care provider.  2024 [employer]/Gold Standard (2021-07-17 00:00:00)

## 2024-02-14 ENCOUNTER — Telehealth: Payer: Self-pay

## 2024-02-14 NOTE — Transitions of Care (Post Inpatient/ED Visit) (Signed)
 Transition of Care week 4  Visit Note  02/14/2024  Name: Heather Chavez MRN: [id number]          DOB: 01/15/1946  Situation: Patient enrolled in [facility] 30-day program. Visit completed with patient by telephone.   Background: Admit/Discharge Date  11/17 - 11/20  Primary Diagnosis: Atrial Afib, UTI, Anemia   Initial Transition Care Management Follow-up Telephone Call Discharge Date and Diagnosis: 01/27/24, New A-fib, UTI   Past Medical History:  Diagnosis Date   Advanced nonexudative age-related macular degeneration of right eye with subfoveal involvement 09/21/2019   Advanced nonexudative age-related macular degeneration of right eye without subfoveal involvement 09/21/2019   Allergic rhinitis    Angina pectoris 05/17/2020   Bilateral breast cancer (HCC) 11/23/2013   BMI 34.0-34.9,adult 09/25/2019   Breast cancer (HCC)    Bronchitis, chronic (HCC)    CAD (coronary artery disease)    Cancer (HCC)    Cardiac murmur 05/17/2020   Coronary artery calcification 05/17/2020   Degenerative retinal drusen of right eye 09/21/2019   Diabetes mellitus due to underlying condition with unspecified complications (HCC) 05/17/2020   Diverticulosis    Dry eyes 06/25/2016   Dyspnea    Emphysema/COPD    Essential hypertension 05/17/2020   Estrogen receptor positive neoplasm 09/24/2015   Family history of malignant neoplasm of breast 11/23/2013   Family history of peritoneal cancer 11/23/2013   Fuchs' corneal dystrophy 11/03/2016   GERD (gastroesophageal reflux disease)    History of colon polyps    History of right breast cancer 09/19/2018   Hypertension    IBS (irritable bowel syndrome)    with D   Intermediate stage nonexudative age-related macular degeneration of left eye 09/21/2019   Iritis 11/03/2016   Macular degeneration    Major depressive disorder    Malignant neoplasm of upper-inner quadrant of left breast in female, estrogen receptor positive (HCC) 09/01/2016   Mixed  dyslipidemia 05/17/2020   Obesity    Obesity (BMI 35.0-39.9 without comorbidity) 05/17/2020   OSA on CPAP 10/29/2020   Diagnosed some 3 months prior after pneumonia episode, Dr. Shellia pulmonology   Peripheral vascular disease    Personal history of chemotherapy    Personal history of radiation therapy    Pseudophakia 09/21/2019   Right-sided chest wall pain    SVT (supraventricular tachycardia) 05/23/2020   Varicose veins of left lower extremity with complications 08/11/2016    Assessment: Patient Reported Symptoms: Cognitive Cognitive Status: No symptoms reported, Normal speech and language skills, Alert and oriented to person, place, and time      Neurological Neurological Review of Symptoms: Numbness (patient reports neuropathy - numbness no pain)    HEENT HEENT Symptoms Reported: No symptoms reported HEENT Comment: denies at this time    Cardiovascular Cardiovascular Symptoms Reported: Swelling in legs or feet (Patient states the swelling in her legs is not bad - states she still feels she has some abdominal fluid - states all improved) Cardiovascular Management Strategies: Medication therapy  Respiratory Respiratory Symptoms Reported: Productive cough Respiratory Management Strategies: Oxygen  therapy, Breathing exercise  Endocrine Endocrine Symptoms Reported: No symptoms reported Is patient diabetic?: Yes Is patient checking blood sugars at home?: No    Gastrointestinal Gastrointestinal Symptoms Reported: No symptoms reported Additional Gastrointestinal Details: Patient states I'm not having a problem and states she is doing well at this time.      Genitourinary Genitourinary Symptoms Reported: No symptoms reported    Integumentary Additional Integumentary Details: Patient states rash on her back cleared  up    Musculoskeletal          Psychosocial           There were no vitals filed for this visit. Pain Scale: 0-10 Pain Score: 0-No pain (Patient states it's  not really bothering me today - refering to her knee)  Medications Reviewed Today     Reviewed by Lauro Shona LABOR, RN (Registered Nurse) on 02/14/24 at 1146  Med List Status: <None>   Medication Order Taking? Sig Documenting Provider Last Dose Status Informant  albuterol  (VENTOLIN  HFA) 108 (90 Base) MCG/ACT inhaler [id number] Yes Inhale 2 puffs into the lungs every 4 (four) hours as needed for wheezing. [provider]  Active   allopurinol  (ZYLOPRIM ) 300 MG tablet [id number] Yes Take 1 tablet (300 mg total) by mouth daily. Cornelius Wanda DEL, MD  Active   Biotin 89999 MCG TABS [id number] Yes Take 10,000 mcg by mouth daily. [provider]  Active   budesonide -formoterol  (SYMBICORT ) 80-4.5 MCG/ACT inhaler [id number] Yes Inhale 2 puffs into the lungs 2 (two) times daily. [provider]  Active   chlorpheniramine-HYDROcodone (TUSSIONEX) 10-8 MG/5ML [id number] Yes Take 5 mLs by mouth every 12 (twelve) hours as needed for cough. Harl Setter A, NP  Active   diltiazem  (CARDIZEM  CD) 120 MG 24 hr capsule [id number] Yes Take 120 mg by mouth daily. [provider]  Active   fexofenadine (ALLEGRA) 180 MG tablet [id number] Yes Take 180 mg by mouth. [provider]  Active   fluocinonide cream (LIDEX) 0.05 % [id number]  Apply 1 Application topically 3 (three) times daily.  Patient not taking: Reported on 02/14/2024   [provider]  Active   FLUoxetine (PROZAC) 10 MG capsule [id number] Yes Take 10 mg by mouth daily.  Patient taking differently: Take 10 mg by mouth every other day.   [provider]  Active   furosemide (LASIX) 40 MG tablet [id number] Yes Take 40 mg by mouth. [provider]  Active   gabapentin (NEURONTIN) 600 MG tablet [id number] Yes Take 600 mg by mouth 2 (two) times daily. [provider]  Active   GARLIC PO [id number] Yes Take 1 tablet by mouth daily. [provider]  Active   ipratropium-albuterol   (DUONEB) 0.5-2.5 (3) MG/3ML SOLN [id number] Yes Take 3 mLs by nebulization every 4 (four) hours as needed (breathing treatment). [provider]  Active   LORazepam  (ATIVAN ) 0.5 MG tablet [id number] Yes Take by mouth. [provider]  Active   magic mouthwash w/lidocaine  SOLN [id number]  Take 5 mLs by mouth 4 (four) times daily. Suspension contains equal amounts of Maalox Extra Strength, nystatin , diphenhydramine  and lidocaine .  Patient not taking: Reported on 02/14/2024   [provider]  Active   magnesium  oxide (MAG-OX) 400 (240 Mg) MG tablet [id number] Yes Take 1 tablet (400 mg total) by mouth daily. Cornelius Wanda DEL, MD  Active   metFORMIN (GLUCOPHAGE) 500 MG tablet [id number] Yes Take 500 mg by mouth daily. [provider]  Active   metoprolol  tartrate (LOPRESSOR ) 100 MG tablet [id number] Yes Take 150 mg by mouth 2 (two) times daily. [provider]  Active   montelukast (SINGULAIR) 10 MG tablet [id number] Yes Take 10 mg by mouth at bedtime. [provider]  Active   Multiple Vitamins-Minerals (PRESERVISION AREDS 2 PO) [id number] Yes Take by mouth. [provider]  Active   nitrofurantoin (MACRODANTIN) 100 MG capsule [id number]  Take 100 mg by mouth 2 (two) times daily. [provider]  Consider Medication Status and Discontinue (Completed Course)   nitroGLYCERIN  (NITROSTAT ) 0.4 MG SL tablet [id number] Yes Place 0.4 mg under the tongue every 5 (five) minutes as needed for chest pain. [provider]  Active   nystatin  (MYCOSTATIN /NYSTOP ) powder [id number]  Apply 1 Application topically 3 (three) times daily.  Patient not taking: Reported on 02/14/2024   Harl Setter A, NP  Active   ondansetron  (ZOFRAN ) 4 MG tablet [id number] Yes Take 1 tablet (4 mg total) by mouth every 4 (four) hours as needed for nausea. Harl Setter A, NP  Active   OXYGEN  [id number] Yes Inhale 2.5 L/min into the lungs continuous. [provider]  Active   pantoprazole  (PROTONIX ) 40 MG tablet [id number] Yes Take 1 tablet (40 mg total) by mouth daily. Call [phone] to schedule an office visit for more refills Charlanne Groom, MD  Active   potassium chloride  SA (KLOR-CON  M) 20 MEQ tablet [id number] Yes Take 1 tablet (20 mEq total) by mouth daily. Cornelius Wanda DEL, MD  Active   prochlorperazine  (COMPAZINE ) 10 MG tablet [id number] Yes Take 1 tablet (10 mg total) by mouth every 6 (six) hours as needed for nausea or vomiting. Harl Setter A, NP  Active   vitamin E 180 MG (400 UNITS) capsule [id number] Yes Take 400 Units by mouth daily. [provider]  Active   zinc gluconate 50 MG tablet [id number] Yes Take 50 mg by mouth daily. [provider]  Active             Recommendation:   Patient will continue to follow with her providers  Follow Up Plan:   Closing From:  Transitions of Care Program  Shona Prow RN, CCM [facility]  VBCI-Population Health RN Care Manager [phone]

## 2024-02-16 ENCOUNTER — Telehealth: Payer: Self-pay | Admitting: Dietician

## 2024-02-16 ENCOUNTER — Inpatient Hospital Stay: Admitting: Hematology and Oncology

## 2024-02-16 ENCOUNTER — Other Ambulatory Visit: Payer: Self-pay

## 2024-02-16 ENCOUNTER — Inpatient Hospital Stay

## 2024-02-16 VITALS — BP 109/59 | HR 68 | Temp 98.3°F | Resp 20 | Ht 65.0 in | Wt 206.5 lb

## 2024-02-16 DIAGNOSIS — C3430 Malignant neoplasm of lower lobe, unspecified bronchus or lung: Secondary | ICD-10-CM | POA: Diagnosis not present

## 2024-02-16 DIAGNOSIS — Z5111 Encounter for antineoplastic chemotherapy: Secondary | ICD-10-CM | POA: Diagnosis not present

## 2024-02-16 DIAGNOSIS — C3431 Malignant neoplasm of lower lobe, right bronchus or lung: Secondary | ICD-10-CM

## 2024-02-16 LAB — CMP (CANCER CENTER ONLY)
ALT: 22 U/L (ref 0–44)
AST: 18 U/L (ref 15–41)
Albumin: 3.8 g/dL (ref 3.5–5.0)
Alkaline Phosphatase: 137 U/L — ABNORMAL HIGH (ref 38–126)
Anion gap: 9 (ref 5–15)
BUN: 20 mg/dL (ref 8–23)
CO2: 31 mmol/L (ref 22–32)
Calcium: 9.2 mg/dL (ref 8.9–10.3)
Chloride: 97 mmol/L — ABNORMAL LOW (ref 98–111)
Creatinine: 0.76 mg/dL (ref 0.44–1.00)
GFR, Estimated: >60 mL/min (ref >60)
Glucose, Bld: 102 mg/dL — ABNORMAL HIGH (ref 70–99)
Potassium: 4 mmol/L (ref 3.5–5.1)
Sodium: 137 mmol/L (ref 135–145)
Total Bilirubin: 0.5 mg/dL (ref 0.0–1.2)
Total Protein: 6.3 g/dL — ABNORMAL LOW (ref 6.5–8.1)

## 2024-02-16 LAB — CBC WITH DIFFERENTIAL (CANCER CENTER ONLY)
Band Neutrophils: 3 %
Basophils Absolute: 0 K/uL (ref 0.0–0.1)
Basophils Relative: 0 %
Eosinophils Absolute: 0 K/uL (ref 0.0–0.5)
Eosinophils Relative: 0 %
HCT: 31.7 % — ABNORMAL LOW (ref 36.0–46.0)
Hemoglobin: 10.1 g/dL — ABNORMAL LOW (ref 12.0–15.0)
Lymphocytes Relative: 16 %
Lymphs Abs: 1.5 K/uL (ref 0.7–4.0)
MCH: 29.8 pg (ref 26.0–34.0)
MCHC: 31.9 g/dL (ref 30.0–36.0)
MCV: 93.5 fL (ref 80.0–100.0)
Monocytes Absolute: 0.3 K/uL (ref 0.1–1.0)
Monocytes Relative: 3 %
Neutro Abs: 7.7 K/uL (ref 1.7–7.7)
Neutrophils Relative %: 78 %
Platelet Count: 187 K/uL (ref 150–400)
RBC: 3.39 MIL/uL — ABNORMAL LOW (ref 3.87–5.11)
RDW: 15.8 % — ABNORMAL HIGH (ref 11.5–15.5)
Smear Review: NORMAL
WBC Count: 9.5 K/uL (ref 4.0–10.5)
nRBC: 0 % (ref 0.0–0.2)

## 2024-02-16 LAB — MAGNESIUM: Magnesium: 1.8 mg/dL (ref 1.7–2.4)

## 2024-02-16 NOTE — Telephone Encounter (Signed)
 Patient screened on MST. First attempt to reach. Provided my cell# on voice mail to return call to set up a nutrition consult.  Micheline Craven, RDN, LDN Registered Dietitian, [facility] Part Time Remote (Usual office hours: Tuesday-Thursday) Cell: [phone]

## 2024-02-16 NOTE — Progress Notes (Unsigned)
 [facility]  [address] [place],  [place]  [address] [phone]  Clinic Day:  02/16/24   Referring physician: [name], MD  ASSESSMENT & PLAN:  Assessment:  Small Cell Lung Cancer This is newly diagnosed but found to be stage IV with liver metastases and significant bulky mediastinal adenopathy, right pleural based mass, and small right pleural effusion. She has been started on chemotherapy with Carboplatin , Etoposide , and Atezolizumab , with her first cycle given 4 weeks ago. She has had a rough time with cytopenias and ended up in the hospital twice but is over the worst. I reviewed all the plans with the patient her daughter and we plan close follow-up and supportive care with weekly labs and visits.   Superior Vena Cava Syndrome Due to this, palliative radiation was added and is helping. She had increased pain in the upper mediastinum earlier this week, but that has improved and she is tapering off the oxycodone. The edema of her arms has resolved and neck and face are greatly decreased.  Hyponatremia This is likely caused by SIADH from her small cell lung cancer and will be monitored closely. Her sodium is finally coming up now and was normal today for the first time at 137. I think it is responding to the treatment for her lung cancer.   Hypomagnesemia She has received IV magnesium . Her level is still mildly low at 1.6, so I will place her on oral supplement of magnesium  oxide 400 mg daily. It is up to 1.7 today.   Pneumonia This is multifocal and at least partially related to post obstruction of the right lower main bronchus. The increased sputum production may be a good sign that the right lower lobe bronchus is opening up.   Anemia This is likely related to her new small cell lung cancer and bilateral pneumonia. Her vitamin levels were all normal. Her hemoglobin dropped further to 7.6 on 01/19/2024 and remained that today. She was given 1 unit of PRBCs  today.  Neutropenia She has profound neutropenia with an ANC down to 100, but this is her nadir and she did not receive Neulasta . I have reviewed neutropenic precautions with her and her daughter, and her counts did recover. We will be able to use Neulasta  with this second cycle.   Thrombocytopenia Her platelet count is not too low, but has gone below 100,000 and she has had some nosebleeds. We will continue to monitor.  History of Stage IA (T1b N0 M0) hormone receptor positive right breast cancer This was diagnosed in April, 2005. She was treated with a lumpectomy and pathology revealed a 6mm, grade 3, invasive ductal carcinoma with sentinel node positive with a 3mm micrometastasis. Estrogen and progesterone receptors were positive and her 2 neu negative. She was treated with 4 cycles of doxorubicin and cyclophosphamide. She was then placed on Anastrozole in November, 2005 and took that for 3.75yrs, but was switched to Tamoxifen in April, 2009 because of her arthralgias related to Anastrozole. She completed 5 years of adjuvant hormonal therapy.    History of Stage IA (T1b N0 M0) hormone receptor positive left breast cancer This was diagnosed in July, 2015. This was treated with lumpectomy and pathology revealed a 6mm, grade 1, ductal carcinoma with negative sentinel node. Estrogen and progesterone receptors were positive and her 2 neu negative and Ki 67 was 18%. She received adjuvant radiation to the left breast and was then placed on Anastrozole again in August, 2015. This was stopped  in October, 2016 due to severe hot flashes, anxiety, and depression, mild cognitive difficulty, trouble finding words, and remembering things as well as becoming easily overwhelmed. She did not tolerate venlafaxine and was placed on paroxetine 20mg  daily in September, 2015 and this improved her symptoms. She was later changed to Lexapro. She was placed on Tamoxifen 20 mg daily for adjuvant hormonal therapy for 5 years.    Plan: She is here for weekly monitoring after receiving cycle 3 carbo/etoposide /atezolizumab . She is doing well today, not requiring any supportive care. She will return next week for repeat evaluation. I discussed the assessment and treatment plan with the patient.  The patient was provided an opportunity to ask questions and all were answered.  The patient agreed with the plan and demonstrated an understanding of the instructions.  The patient was advised to call back if the symptoms worsen or if the condition fails to improve as anticipated.  I provided 20 minutes of face-to-face time during this this encounter and > 50% was spent counseling as documented under my assessment and plan.   [name], NP [facility] - A DEPT OF [facility]. [facility] [address] [place] [address] Dept: [phone] Dept Fax: [phone]   No orders of the defined types were placed in this encounter.  CHIEF COMPLAINT:  CC: Small Cell Lung Cancer  Current Treatment:  Chemotherapy and Immunotherapy   HISTORY OF PRESENT ILLNESS:  Heather Chavez is a 78 y.o. female with a history of bilateral breast cancer who is referred in consultation by Dr. [name] for assessment and management of newly diagnosed small cell lung cancer. She has a history of stage 1A right breast cancer 06/2003 (s/p lumpectomy, doxorubicin + cyclophosphamide, and 5 years of adjuvant hormonal therapy with tamoxifen/anastrozole), stage 1A left breast cancer (s/p lumpectomy, adjuvant radiation, and 5 years of adjuvant hormonal therapy with tamoxifen/anastrozole), COPD/asthma overlap, and tobacco use.  She underwent testing for hereditary breast and ovarian cancer with the [employer] OvaNext 25 gene panel, which did not reveal any clinically significant mutation. She did have a variant of uncertain significance of MSH2. She had a previous hysterectomy and unilateral  oophorectomy for benign disease at a fairly young age, so does not require pap smears. Screening colonoscopy in February, 2017 revealed multiple polyps, all of which were benign polypoid colonic mucosa.  Cheridan was admitted into the [facility] ED on October 20th, 2025 and presented with shortness of breath, fevers, and chills. She received a chest x-ray for further evaluation which revealed an right upper lobe infiltrate versus mass and chest CT angio revealed emphysema with chronic interstitial changes in both lung fields corresponding with multifocal pneumonitis and a suspicious pleural based mass in the right upper lobe measuring 3.9cm, and multiple suspicious enlarged mediastinal lymph nodes.  She was diagnosed with mass of the right lung, acute hypoxic respiratory failure, and bilateral community acquired pneumonia before being transferred to [facility]. She then had bronchoscopy and biopsy and pathology report was positive for small cell carcinoma with positive tumor cells for TTF-1 and neuroendocrine markers CD56, synaptophysin, and chromogranin. GATA-3 is negative and Pan keratin showed dot-like positive staining. She comes to me for discussion of staging and treatment and had a PET scan and brain MRI today but results are pending. I recommend systemic chemotherapy with Carboplatin  and Etopiside. She will also have immunotherapy, either concurrent or sequential depending on her staging. She will have her  chemoeducation tomorrow and port placed this Friday.   I have reviewed her chart and materials related to her cancer extensively and collaborated history with the patient. Summary of oncologic history is as follows: Oncology History  Lung cancer, lower lobe (HCC)  01/05/2024 Initial Diagnosis   Lung cancer, lower lobe (HCC)   01/06/2024 Cancer Staging   Staging form: Lung, AJCC V9 - Clinical stage from 01/06/2024: Stage IVB (cT4, cN2b, cM1c1) - Signed by Cornelius Wanda DEL, MD on  01/06/2024 Histopathologic type: Small cell carcinoma, NOS Stage prefix: Initial diagnosis Method of lymph node assessment: Clinical IASLC grade: G3 Histologic grading system: 3 grade system Laterality: Right Tumor size (mm): 56 Sites of metastasis: Liver Lymph-vascular invasion (LVI): Presence of LVI unknown/indeterminate Diagnostic confirmation: Positive histology Specimen type: Bronchial Biopsy Staged by: Managing physician Standard uptake value (SUV): 12.5 Perineural invasion (PNI): Unknown Pleural/elastic layer invasion: Unknown Stage used in treatment planning: Yes National guidelines used in treatment planning: Yes Type of national guideline used in treatment planning: NCCN   01/10/2024 -  Chemotherapy   Patient is on Treatment Plan : LUNG SCLC Carboplatin  + Etoposide  + Atezolizumab  Induction q21d x 4 cycles / Atezolizumab  Maintenance q21d      INTERVAL HISTORY:  Renn is seen in the clinic for follow up of her newly diagnosed small cell carcinoma. Patient states that she feels better but was seen in the ER on 02/05/2024 with cough and shortness of breath. Evaluation felt this was acute bronchitis and she was placed on azithromycin  250mg  daily for 5 days and prednisone  two pills daily for 5 days along with tessalon perles, along with oxycodone 5mg . She tells me she does feel better but is still cough with occasional dark sputum and thick greenish phlegm. She is finishing her medications from the ER and feels she is ready for her second cycle of chemotherapy as planned. We had already delayed one week due to her hospitalization in mid November. Her sister questioned why her radiation was stopped 2 days early and I explained that it had accomplished treatment of her SVC syndrome and she was hospitalized at the time she was due for the last 2 doses. We also prefer to give her Neulasta  which was held for her first cycle due to the radiation and she did end up with 2 hospitalizations and  cytopenias. Her day 1 cycle 2 of carboplatin , atezolizumab , and atoposide is scheduled on 02/09/2024. She will receive an Neulasta  injection on 02/14/2024. I advised her to take Claritin before this injection. She has a WBC of 7.4, low hemoglobin of 10.4 improved from 9.9, and elevated platelet count of 473,000. Her magnesium  is normal at 1.7 and her CMP is normal other than an elevated alkaline phosphatase of 131 improved from 146. She continues oral magnesium  once daily. She will return weekly with CBC, CMP, and magnesium  levels. She will return on the 22nd for her next cycle.  She denies fever, chills, night sweats, or other signs of infection. She denies cardiorespiratory and gastrointestinal issues. She  denies pain. Her appetite is good and Her weight has decreased 1 pounds over last week. This patient is accompanied in the office by her sister.  HISTORY:   Past Medical History:  Diagnosis Date   Advanced nonexudative age-related macular degeneration of right eye with subfoveal involvement 09/21/2019   Advanced nonexudative age-related macular degeneration of right eye without subfoveal involvement 09/21/2019   Allergic rhinitis    Angina pectoris 05/17/2020   Bilateral breast cancer (HCC) 11/23/2013  BMI 34.0-34.9,adult 09/25/2019   Breast cancer (HCC)    Bronchitis, chronic (HCC)    CAD (coronary artery disease)    Cancer (HCC)    Cardiac murmur 05/17/2020   Coronary artery calcification 05/17/2020   Degenerative retinal drusen of right eye 09/21/2019   Diabetes mellitus due to underlying condition with unspecified complications (HCC) 05/17/2020   Diverticulosis    Dry eyes 06/25/2016   Dyspnea    Emphysema/COPD    Essential hypertension 05/17/2020   Estrogen receptor positive neoplasm 09/24/2015   Family history of malignant neoplasm of breast 11/23/2013   Family history of peritoneal cancer 11/23/2013   Fuchs' corneal dystrophy 11/03/2016   GERD (gastroesophageal reflux  disease)    History of colon polyps    History of right breast cancer 09/19/2018   Hypertension    IBS (irritable bowel syndrome)    with D   Intermediate stage nonexudative age-related macular degeneration of left eye 09/21/2019   Iritis 11/03/2016   Macular degeneration    Major depressive disorder    Malignant neoplasm of upper-inner quadrant of left breast in female, estrogen receptor positive (HCC) 09/01/2016   Mixed dyslipidemia 05/17/2020   Obesity    Obesity (BMI 35.0-39.9 without comorbidity) 05/17/2020   OSA on CPAP 10/29/2020   Diagnosed some 3 months prior after pneumonia episode, Dr. Shellia pulmonology   Peripheral vascular disease    Personal history of chemotherapy    Personal history of radiation therapy    Pseudophakia 09/21/2019   Right-sided chest wall pain    SVT (supraventricular tachycardia) 05/23/2020   Varicose veins of left lower extremity with complications 08/11/2016   Past Surgical History:  Procedure Laterality Date   BREAST BIOPSY     BREAST LUMPECTOMY     right brwast 2005/ left 2015   CATARACT EXTRACTION     CHOLECYSTECTOMY     COLONOSCOPY  04/17/2015   Colonic polyps status post polypectomy. Moderate to severe sigmoid diverticulosis   ENDOVENOUS ABLATION SAPHENOUS VEIN W/ LASER Right 02/19/2023   endovenous laser ablation right greater saphenous vein and stab phlebectomy 10-20 incisions right leg by Gaile New MD   TUBAL LIGATION     VAGINAL HYSTERECTOMY     VEIN LIGATION AND STRIPPING     Family History  Problem Relation Age of Onset   Cancer Mother 21       bilateral breast cancer ages 53 then 6. Peritoneal cancer at age 34.   Other Mother        Ms. Guinther's mother was adopted so have no information regarding maternal family  history other than her mother   Breast cancer Mother    Cancer Father 46       renal   Cancer Sister 65       breast   Breast cancer Sister    Breast cancer Daughter    Breast cancer Maternal Aunt     Breast cancer Paternal Aunt    Breast cancer Paternal Grandmother    Cancer Paternal Grandmother        bilateral breast cancer at unknown age   Social History:  reports that she quit smoking about 42 years ago. Her smoking use included cigarettes. She started smoking about 57 years ago. She has a 37.5 pack-year smoking history. She has never used smokeless tobacco. She reports current alcohol use. She reports that she does not use drugs.The patient is accompanied by her daughter and granddaughter today.  Allergies:  Allergies  Allergen Reactions  Codeine Nausea And Vomiting and Other (See Comments)    Other reaction(s): Other (see comments), Vomiting   Levofloxacin Other (See Comments)    Leg cramps   Nexlizet  [Bempedoic Acid-Ezetimibe ] Other (See Comments)   Pitavastatin  Other (See Comments)    Leg cramps  Other Reaction(s): Other (See Comments)  Leg cramps    Leg cramps   Rosuvastatin  Other (See Comments)    Muscle pain    Current Medications: Current Outpatient Medications  Medication Sig Dispense Refill   albuterol  (VENTOLIN  HFA) 108 (90 Base) MCG/ACT inhaler Inhale 2 puffs into the lungs every 4 (four) hours as needed for wheezing.     allopurinol  (ZYLOPRIM ) 300 MG tablet Take 1 tablet (300 mg total) by mouth daily. 30 tablet 0   Biotin 89999 MCG TABS Take 10,000 mcg by mouth daily.     budesonide -formoterol  (SYMBICORT ) 80-4.5 MCG/ACT inhaler Inhale 2 puffs into the lungs 2 (two) times daily.     chlorpheniramine-HYDROcodone (TUSSIONEX) 10-8 MG/5ML Take 5 mLs by mouth every 12 (twelve) hours as needed for cough. 473 mL 0   diltiazem  (CARDIZEM  CD) 120 MG 24 hr capsule Take 120 mg by mouth daily.     fexofenadine (ALLEGRA) 180 MG tablet Take 180 mg by mouth.     fluocinonide cream (LIDEX) 0.05 % Apply 1 Application topically 3 (three) times daily. (Patient not taking: Reported on 02/14/2024)     FLUoxetine (PROZAC) 10 MG capsule Take 10 mg by mouth daily. (Patient taking  differently: Take 10 mg by mouth every other day.)     furosemide (LASIX) 40 MG tablet Take 40 mg by mouth.     gabapentin (NEURONTIN) 600 MG tablet Take 600 mg by mouth 2 (two) times daily.     GARLIC PO Take 1 tablet by mouth daily.     ipratropium-albuterol  (DUONEB) 0.5-2.5 (3) MG/3ML SOLN Take 3 mLs by nebulization every 4 (four) hours as needed (breathing treatment).     LORazepam  (ATIVAN ) 0.5 MG tablet Take by mouth.     magic mouthwash w/lidocaine  SOLN Take 5 mLs by mouth 4 (four) times daily. Suspension contains equal amounts of Maalox Extra Strength, nystatin , diphenhydramine  and lidocaine . (Patient not taking: Reported on 02/14/2024)     magnesium  oxide (MAG-OX) 400 (240 Mg) MG tablet Take 1 tablet (400 mg total) by mouth daily. 30 tablet 5   metFORMIN (GLUCOPHAGE) 500 MG tablet Take 500 mg by mouth daily.     metoprolol  tartrate (LOPRESSOR ) 100 MG tablet Take 150 mg by mouth 2 (two) times daily.     montelukast (SINGULAIR) 10 MG tablet Take 10 mg by mouth at bedtime.     Multiple Vitamins-Minerals (PRESERVISION AREDS 2 PO) Take by mouth.     nitrofurantoin (MACRODANTIN) 100 MG capsule Take 100 mg by mouth 2 (two) times daily.     nitroGLYCERIN  (NITROSTAT ) 0.4 MG SL tablet Place 0.4 mg under the tongue every 5 (five) minutes as needed for chest pain.     nystatin  (MYCOSTATIN /NYSTOP ) powder Apply 1 Application topically 3 (three) times daily. (Patient not taking: Reported on 02/14/2024) 15 g 0   ondansetron  (ZOFRAN ) 4 MG tablet Take 1 tablet (4 mg total) by mouth every 4 (four) hours as needed for nausea. 90 tablet 3   OXYGEN  Inhale 2.5 L/min into the lungs continuous.     pantoprazole  (PROTONIX ) 40 MG tablet Take 1 tablet (40 mg total) by mouth daily. Call [phone] to schedule an office visit for more refills 90 tablet 0  potassium chloride  SA (KLOR-CON  M) 20 MEQ tablet Take 1 tablet (20 mEq total) by mouth daily. 30 tablet 5   prochlorperazine  (COMPAZINE ) 10 MG tablet Take 1 tablet  (10 mg total) by mouth every 6 (six) hours as needed for nausea or vomiting. 90 tablet 3   vitamin E 180 MG (400 UNITS) capsule Take 400 Units by mouth daily.     zinc gluconate 50 MG tablet Take 50 mg by mouth daily.     No current facility-administered medications for this visit.    REVIEW OF SYSTEMS:  Review of Systems  Constitutional: Negative.  Negative for appetite change, chills, diaphoresis, fatigue, fever and unexpected weight change.  HENT:   Positive for nosebleeds. Negative for hearing loss, lump/mass, mouth sores, sore throat, tinnitus, trouble swallowing and voice change.   Eyes:  Positive for eye problems (vision changes).  Respiratory:  Positive for cough and shortness of breath. Negative for chest tightness, hemoptysis and wheezing.   Cardiovascular:  Positive for leg swelling. Negative for chest pain and palpitations.  Gastrointestinal:  Negative for abdominal distention, abdominal pain, blood in stool, constipation, diarrhea, nausea, rectal pain and vomiting.       Bowel incontinence upon straining  Endocrine: Negative.   Genitourinary:  Positive for bladder incontinence (upon straining). Negative for difficulty urinating, dyspareunia, dysuria, frequency, hematuria, menstrual problem, nocturia, pelvic pain, vaginal bleeding and vaginal discharge.   Musculoskeletal: Negative.  Negative for arthralgias, back pain, flank pain, gait problem, myalgias, neck pain and neck stiffness.  Skin: Negative.  Negative for itching, rash and wound.  Neurological:  Positive for headaches and numbness (neuropathy of bilateral feet). Negative for dizziness, extremity weakness, gait problem, light-headedness, seizures and speech difficulty.  Hematological:  Negative for adenopathy. Bruises/bleeds easily.  Psychiatric/Behavioral: Negative.  Negative for depression and sleep disturbance. The patient is not nervous/anxious.    VITALS:  There were no vitals taken for this visit.  Wt Readings from  Last 3 Encounters:  02/10/24 209 lb 0.6 oz (94.8 kg)  02/09/24 207 lb 4.8 oz (94 kg)  02/02/24 205 lb (93 kg)    There is no height or weight on file to calculate BMI.  Performance status (ECOG): 1 - Symptomatic but completely ambulatory  PHYSICAL EXAM:  Physical Exam Vitals and nursing note reviewed. Exam conducted with a chaperone present.  Constitutional:      General: She is not in acute distress.    Appearance: Normal appearance. She is normal weight. She is not ill-appearing, toxic-appearing or diaphoretic.  HENT:     Head: Normocephalic and atraumatic.     Right Ear: Tympanic membrane, ear canal and external ear normal. There is no impacted cerumen.     Left Ear: Tympanic membrane, ear canal and external ear normal. There is no impacted cerumen.     Nose: Nose normal. No congestion or rhinorrhea.     Mouth/Throat:     Mouth: Mucous membranes are moist.     Pharynx: Oropharynx is clear. No oropharyngeal exudate or posterior oropharyngeal erythema.  Eyes:     General: No scleral icterus.       Right eye: No discharge.        Left eye: No discharge.     Extraocular Movements: Extraocular movements intact.     Conjunctiva/sclera: Conjunctivae normal.     Pupils: Pupils are equal, round, and reactive to light.  Cardiovascular:     Rate and Rhythm: Normal rate and regular rhythm.     Pulses: Normal pulses.  Heart sounds: Normal heart sounds. No murmur heard.    No friction rub. No gallop.  Pulmonary:     Effort: Pulmonary effort is normal. No respiratory distress.     Breath sounds: Examination of the right-lower field reveals decreased breath sounds. Decreased breath sounds present.  Abdominal:     General: Bowel sounds are normal. There is no distension.     Palpations: Abdomen is soft. There is hepatomegaly (mild). There is no splenomegaly or mass.     Tenderness: There is no abdominal tenderness. There is no right CVA tenderness, left CVA tenderness, guarding or  rebound.     Hernia: No hernia is present.  Musculoskeletal:        General: Normal range of motion.     Cervical back: Normal range of motion and neck supple.     Right lower leg: Edema (1-2+) present.     Left lower leg: Edema (1-2+) present.     Comments: Bilateral lower leg 1-2+ edema  Lymphadenopathy:     Cervical: No cervical adenopathy.     Right cervical: No superficial, deep or posterior cervical adenopathy.    Left cervical: No superficial, deep or posterior cervical adenopathy.     Upper Body:     Right upper body: No supraclavicular, axillary or pectoral adenopathy.     Left upper body: No supraclavicular, axillary or pectoral adenopathy.  Skin:    General: Skin is warm and dry.  Neurological:     General: No focal deficit present.     Mental Status: She is alert and oriented to person, place, and time. Mental status is at baseline.  Psychiatric:        Mood and Affect: Mood normal.        Behavior: Behavior normal.        Thought Content: Thought content normal.        Judgment: Judgment normal.    LABS:      Latest Ref Rng & Units 02/09/2024   10:06 AM 01/31/2024   12:49 PM 01/21/2024   11:14 AM  CBC  WBC 4.0 - 10.5 K/uL 7.4  8.4  0.7   Hemoglobin 12.0 - 15.0 g/dL 89.5  9.9  7.6   Hematocrit 36.0 - 46.0 % 32.7  30.9  23.1   Platelets 150 - 400 K/uL 473  260  94       Latest Ref Rng & Units 02/09/2024   10:06 AM 01/31/2024   12:49 PM 01/21/2024   11:14 AM  CMP  Glucose 70 - 99 mg/dL 873  893  861   BUN 8 - 23 mg/dL 19  14  17    Creatinine 0.44 - 1.00 mg/dL 9.05  9.25  9.16   Sodium 135 - 145 mmol/L 137  134  130   Potassium 3.5 - 5.1 mmol/L 4.0  4.1  4.3   Chloride 98 - 111 mmol/L 97  94  91   CO2 22 - 32 mmol/L 31  30  28    Calcium  8.9 - 10.3 mg/dL 9.3  9.4  9.4   Total Protein 6.5 - 8.1 g/dL 6.6  6.7  6.5   Total Bilirubin 0.0 - 1.2 mg/dL 0.3  0.3  0.4   Alkaline Phos 38 - 126 U/L 131  146  120   AST 15 - 41 U/L 22  30  41   ALT 0 - 44 U/L 24  30   74      Lab Results  Component Value Date   CEA 11.87 (H) 01/06/2024   /  CEA ([facility])  Date Value Ref Range Status  01/06/2024 11.87 (H) 0.00 - 5.00 ng/mL Final    Comment:    (NOTE) This test was performed using [employer] Coulter's paramagnetic chemiluminescent immunoassay. Values obtained from different assay methods cannot be used interchangeably. Please note that up to 8% of patients who smoke may see values 5.1-10.0 ng/ml and 1% of patients who smoke may see CEA levels >10.0 ng/ml. Performed at [facility], [address], [place], [place] [address]    No results found for: PSA1 No results found for: CAN199 No results found for: CAN125  No results found for: STEPHANY RINGS, A1GS, A2GS, BETS, BETA2SER, GAMS, MSPIKE, SPEI Lab Results  Component Value Date   TIBC 316 01/10/2024   FERRITIN 297 01/10/2024   IRONPCTSAT 27 01/10/2024   No results found for: LDH  STUDIES:  EXAM: 01/05/2024 MRI HEAD WITH WITHOUT CM IMPRESSION: Relatively age-appropriate atrophy and mall-vessel disease  EXAM: 01/05/2024 PET SKULL BASE TO MID-THIGH IMPRESSION: Irregular solid noncalcified peripheral right upper lobe mass likely primary neoplasm. Extensive mediastinal and right hilar metastatic adenopathy. Extensive hepatic metastatic disease. Probable single metastatic lesion in the left proximal femur. No definitive adrenal metastatic disease.   Exam: 12/29/2023 Pathology Impression: Lung, right upper lobe, needle core biopsy: Small Cell Carcinoma The tumor cells are positive for TTF-1 and neuroendocrine markers CD56, synaptophysin, and chromogranin. GATA-3 is negative, Pan keratin shows dot-like positive staining.

## 2024-02-17 ENCOUNTER — Other Ambulatory Visit: Payer: Self-pay | Admitting: Oncology

## 2024-02-17 DIAGNOSIS — C3431 Malignant neoplasm of lower lobe, right bronchus or lung: Secondary | ICD-10-CM

## 2024-02-17 DIAGNOSIS — C3411 Malignant neoplasm of upper lobe, right bronchus or lung: Secondary | ICD-10-CM

## 2024-02-17 NOTE — Telephone Encounter (Signed)
Can stop now.

## 2024-02-18 ENCOUNTER — Telehealth: Payer: Self-pay

## 2024-02-18 NOTE — Telephone Encounter (Signed)
-----   Message from Wanda Cornish, MD sent at 02/17/2024  6:32 PM EST ----- Regarding: call Tell her she is ready to stop the allopurinol  so I denied the refill request

## 2024-02-23 ENCOUNTER — Telehealth: Payer: Self-pay

## 2024-02-23 ENCOUNTER — Inpatient Hospital Stay: Admitting: Oncology

## 2024-02-23 ENCOUNTER — Inpatient Hospital Stay

## 2024-02-23 LAB — LAB REPORT - SCANNED: EGFR: 76

## 2024-02-23 NOTE — Telephone Encounter (Signed)
 Grand-daughter Autumn called with patient on the phone, stating that patient has had chest pain and trouble taking a deep breath all night and not relieved by pain medication. Advised to go to ED due to the unrelieved chest pain. Autumn will keep up informed and patient will probably not be here for her appointment today. Eleanor Bach, FNP-BC made aware.

## 2024-02-24 ENCOUNTER — Encounter: Payer: Self-pay | Admitting: Oncology

## 2024-02-24 ENCOUNTER — Inpatient Hospital Stay (HOSPITAL_BASED_OUTPATIENT_CLINIC_OR_DEPARTMENT_OTHER): Admitting: Oncology

## 2024-02-24 ENCOUNTER — Other Ambulatory Visit: Payer: Self-pay | Admitting: Oncology

## 2024-02-24 VITALS — BP 118/64 | HR 82 | Temp 98.3°F | Resp 20 | Ht 65.0 in | Wt 211.0 lb

## 2024-02-24 DIAGNOSIS — C3431 Malignant neoplasm of lower lobe, right bronchus or lung: Secondary | ICD-10-CM

## 2024-02-24 DIAGNOSIS — Z5111 Encounter for antineoplastic chemotherapy: Secondary | ICD-10-CM | POA: Diagnosis not present

## 2024-02-24 DIAGNOSIS — R601 Generalized edema: Secondary | ICD-10-CM

## 2024-02-24 MED ORDER — TORSEMIDE 20 MG PO TABS: 40.0000 mg | ORAL_TABLET | Freq: Every day | ORAL | 5 refills | Status: AC

## 2024-02-24 MED ORDER — HYDROCODONE-ACETAMINOPHEN 10-300 MG PO TABS: 1.0000 | ORAL_TABLET | ORAL | 0 refills | Status: AC | PRN

## 2024-02-24 NOTE — Progress Notes (Signed)
 " [facility]  [address] [place],  [place]  [address] [phone]  Clinic Day: 02/24/2024  Referring physician: [name], MD  ASSESSMENT & PLAN:  Assessment:  Small Cell Lung Cancer This is newly diagnosed but found to be stage IV with liver metastases and significant bulky mediastinal adenopathy, right pleural based mass, and small right pleural effusion. She has been started on chemotherapy with Carboplatin , Etoposide , and Atezolizumab , with her first cycle given 6 weeks ago. She has had a rough time with cytopenias and ended up in the hospital twice but is over the worst. She did better with her second cycle but developed this pleuritic chest pain over the last few days. I reassured her that the cancer is responding and we want to continue with her treatment. I reviewed all the plans with the patient her daughter and we plan close follow-up and supportive care with weekly labs and visits.   Superior Vena Cava Syndrome Due to this, palliative radiation was added and is helping. She had increased pain in the upper mediastinum earlier this week, but that has improved and she is tapering off the oxycodone. The edema of her arms has resolved and neck and face are greatly decreased after palliative radiation.  Hyponatremia This is likely caused by SIADH from her small cell lung cancer and will be monitored closely. Her sodium is finally coming up now and was normal for the first time 2 weeks ago. Today it is mildly lower at 133. I think it is responding to the treatment for her lung cancer.   Hypomagnesemia She has received IV magnesium  on multiple occasions and we will continue to monitor this and supplement as needed. She is on oral 400 mg magnesium  oxide supplement. If her level continues to remain low, we may need to increase this to BID.  Pneumonia This was multifocal in November and at least partially related to post obstruction of the right lower main bronchus. We do  not see these infiltrates on her current CT scan, but she does have some atelectasis bilaterally.  Anemia This is likely related to her new small cell lung cancer and bilateral pneumonia. Her vitamin levels were all normal. Her hemoglobin dropped further to 7.6 on 01/19/2024 and she was given 1 unit of PRBC's the following week. Her hemoglobin is stable this week at 9.6.  Neutropenia She had profound neutropenia with her first cycle, with an ANC down to 100 at her nadir but she did not receive Neulasta  due to the radiation. I have reviewed neutropenic precautions with her and her daughter, and her counts did recover. We were able to use Neulasta  with this second cycle, which is probably why she has a leukocytosis of 20,000 now, although this could be reactive to a current respiratory infection/pleurisy.   Thrombocytopenia Her platelet count is not too low, but was 59,000 yesterday and she has had significant bruising. We will continue to monitor.  History of Stage IA (T1b N0 M0) hormone receptor positive right breast cancer This was diagnosed in April, 2005. She was treated with a lumpectomy and pathology revealed a 6 mm, grade 3, invasive ductal carcinoma with sentinel node positive with a 3 mm micrometastasis. Estrogen and progesterone receptors were positive and her 2 neu negative. She was treated with 4 cycles of doxorubicin and cyclophosphamide. She was then placed on Anastrozole in November, 2005 and took that for 3.39yrs, but was switched to Tamoxifen in April, 2009 because of her arthralgias related  to Anastrozole. She completed 5 years of adjuvant hormonal therapy.    History of Stage IA (T1b N0 M0) hormone receptor positive left breast cancer This was diagnosed in July, 2015. This was treated with lumpectomy and pathology revealed a 6mm, grade 1, ductal carcinoma with negative sentinel node. Estrogen and progesterone receptors were positive and her 2 neu negative and Ki 67 was 18%. She  received adjuvant radiation to the left breast and was then placed on Anastrozole again in August, 2015. This was stopped in October, 2016 due to severe hot flashes, anxiety, and depression, mild cognitive difficulty, trouble finding words, and remembering things as well as becoming easily overwhelmed. She did not tolerate venlafaxine and was placed on paroxetine 20mg  daily in September, 2015 and this improved her symptoms. She was later changed to Lexapro. She was placed on Tamoxifen 20 mg daily for adjuvant hormonal therapy for 5 years.   Plan: She developed superior vena cava syndrome and had some palliative radiation. She has received 2 cycles of chemotherapy and had many complications after the first one but did better the second time. Patient states that she feels poorly and complains of stabbing chest pain worse with inspiration, shortness of breath, and  leg swelling R>L. She is on continuous oxygen  support via nasal cannula. She was directed to [facility] ED on 02/23/2024 for left upper chest pain, sharp and worse with inspiration, radiating to her left shoulder described as a dull ache, shortness of breath, and left leg swelling. Venous ultrasound of the leg was negative for DVT. Her blood culture on 02/23/2024 was negative and her urinalysis was normal. She had a CTA on 02/23/2024 which revealed no mediastinal lymph nodes, no evidence of pulmonary thromboembolism, minimal pericardial effusion measuring up to 5 mm, minimal to mild left pleural effusion with subpleural atelectasis in the posterior basal segments of both lower lung lobes, visualized centrilobular emphysema predominantly in bilateral upper lung lobes, subpleural fibroatelectatic changes involving bilateral upper lung lobes, right middle love, lingular lobe, and bilateral lower lung lobes, mild left ventricular wall hypertrophy, atherosclerotic wall calcifications in the aorta and coronary arteries without evidence of aneurysm or  dissection, and elevation of the right dome of the diaphragm with Chilaiditi's sign showing colonic interposition between the right diaphragm and liver surface. She affirms low urine output and worsening edema, but denies dysuria. Her labs were performed at [facility] on 02/23/2024 and she had an elevated WBC of 20.3 with an ANC of 16,040, a low hemoglobin of 9.6, and a low platelet count of 59,000. Her CMP revealed a low sodium of 133 and an elevated blood glucose of 201. Since the CTA does not show her lung cancer, pulmonary emboli, or pneumonia, I think her pain is pleurisy and reassured her that it should pass. She is currently taking 40 mg furosemide daily. I instructed her to discontinue this and begin 20 mg torsemide  BID then decrease to once daily if her swelling is under control. I also prescribed 10-300 mg hydrocodone -acetaminophen  every 4 hours PRN to control her pain since the oxycodone 10 mg seems too strong, but 5 mg was not strong enough. She is scheduled for day 1, cycle 3 of carboplatin  + etoposide  + atezolizumab  on 02/28/2024. I will see her back one week later with CBC, CMP, and magnesium . I discussed the assessment and treatment plan with the patient.  The patient was provided an opportunity to ask questions and all were answered.  The patient agreed with the plan and  demonstrated an understanding of the instructions.  The patient was advised to call back if the symptoms worsen or if the condition fails to improve as anticipated.  I provided 22 minutes of face-to-face time during this this encounter and > 50% was spent counseling as documented under my assessment and plan.   [name], MD [facility] A DEPT OF [facility] [address] [place] [address] Dept: [phone] Dept Fax: [phone]   No orders of the defined types were placed in this encounter.  CHIEF COMPLAINT:  CC: Small Cell Lung  Cancer  Current Treatment:  Chemotherapy and Immunotherapy   HISTORY OF PRESENT ILLNESS:  Heather Chavez is a 78 y.o. female with a history of bilateral breast cancer who is referred in consultation by Dr. [name] for assessment and management of newly diagnosed small cell lung cancer. She has a history of stage 1A right breast cancer 06/2003 (s/p lumpectomy, doxorubicin + cyclophosphamide, and 5 years of adjuvant hormonal therapy with tamoxifen/anastrozole), stage 1A left breast cancer (s/p lumpectomy, adjuvant radiation, and 5 years of adjuvant hormonal therapy with tamoxifen/anastrozole), COPD/asthma overlap, and tobacco use.  She underwent testing for hereditary breast and ovarian cancer with the [employer] OvaNext 25 gene panel, which did not reveal any clinically significant mutation. She did have a variant of uncertain significance of MSH2. She had a previous hysterectomy and unilateral oophorectomy for benign disease at a fairly young age, so does not require pap smears. Screening colonoscopy in February, 2017 revealed multiple polyps, all of which were benign polypoid colonic mucosa.  Brielyn was admitted into the [facility] ED on October 20th, 2025 and presented with shortness of breath, fevers, and chills. She received a chest x-ray for further evaluation which revealed an right upper lobe infiltrate versus mass and chest CT angio revealed emphysema with chronic interstitial changes in both lung fields corresponding with multifocal pneumonitis and a suspicious pleural based mass in the right upper lobe measuring 3.9cm, and multiple suspicious enlarged mediastinal lymph nodes.  She was diagnosed with mass of the right lung, acute hypoxic respiratory failure, and bilateral community acquired pneumonia before being transferred to [facility]. She then had bronchoscopy and biopsy and pathology report was positive for small cell carcinoma with positive tumor cells for TTF-1 and  neuroendocrine markers CD56, synaptophysin, and chromogranin. GATA-3 is negative and Pan keratin showed dot-like positive staining. She comes to me for discussion of staging and treatment and had a PET scan and brain MRI today but results are pending. I recommend systemic chemotherapy with Carboplatin  and Etopiside. She will also have immunotherapy, either concurrent or sequential depending on her staging. She will have her chemoeducation tomorrow and port placed this Friday.   I have reviewed her chart and materials related to her cancer extensively and collaborated history with the patient. Summary of oncologic history is as follows: Oncology History  Lung cancer, lower lobe (HCC)  01/05/2024 Initial Diagnosis   Lung cancer, lower lobe (HCC)   01/06/2024 Cancer Staging   Staging form: Lung, AJCC V9 - Clinical stage from 01/06/2024: Stage IVB (cT4, cN2b, cM1c1) - Signed by Cornish Wanda VEAR, MD on 01/06/2024 Histopathologic type: Small cell carcinoma, NOS Stage prefix: Initial diagnosis Method of lymph node assessment: Clinical IASLC grade: G3 Histologic grading system: 3 grade system Laterality: Right Tumor size (mm): 56 Sites of metastasis: Liver Lymph-vascular invasion (LVI): Presence of LVI unknown/indeterminate Diagnostic confirmation: Positive histology Specimen  type: Bronchial Biopsy Staged by: Managing physician Standard uptake value (SUV): 12.5 Perineural invasion (PNI): Unknown Pleural/elastic layer invasion: Unknown Stage used in treatment planning: Yes National guidelines used in treatment planning: Yes Type of national guideline used in treatment planning: NCCN   01/10/2024 -  Chemotherapy   Patient is on Treatment Plan : LUNG SCLC Carboplatin  + Etoposide  + Atezolizumab  Induction q21d x 4 cycles / Atezolizumab  Maintenance q21d      INTERVAL HISTORY:  Joleena is seen in the clinic for follow up of her extensive-stage small cell lung carcinoma, diagnosed in October  2025. She developed superior vena cava syndrome and had some palliative radiation. She has received 2 cycles of chemotherapy and had many complications after the first one but did better the second time. Patient states that she feels poorly and complains of stabbing chest pain worse with inspiration, shortness of breath, and  leg swelling R>L. She is on continuous oxygen  support via nasal cannula. She was directed to [facility] ED on 02/23/2024 for left upper chest pain, sharp and worse with inspiration, radiating to her left shoulder described as a dull ache, shortness of breath, and left leg swelling. Venous ultrasound of the leg was negative for DVT. Her blood culture on 02/23/2024 was negative and her urinalysis was normal. She had a CTA on 02/23/2024 which revealed no mediastinal lymph nodes, no evidence of pulmonary thromboembolism, minimal pericardial effusion measuring up to 5 mm, minimal to mild left pleural effusion with subpleural atelectasis in the posterior basal segments of both lower lung lobes, visualized centrilobular emphysema predominantly in bilateral upper lung lobes, subpleural fibroatelectatic changes involving bilateral upper lung lobes, right middle love, lingular lobe, and bilateral lower lung lobes, mild left ventricular wall hypertrophy, atherosclerotic wall calcifications in the aorta and coronary arteries without evidence of aneurysm or dissection, and elevation of the right dome of the diaphragm with Chilaiditi's sign showing colonic interposition between the right diaphragm and liver surface. She affirms low urine output and worsening edema, but denies dysuria. Her labs were performed at [facility] on 02/23/2024 and she had an elevated WBC of 20.3 with an ANC of 16,040, a low hemoglobin of 9.6, and a low platelet count of 59,000. Her CMP revealed a low sodium of 133 and an elevated blood glucose of 201. Since the CTA does not show her lung cancer, pulmonary emboli, or  pneumonia, I think her pain is pleurisy and reassured her that it should pass. She is currently taking 40 mg furosemide daily. I instructed her to discontinue this and begin 20 mg torsemide  BID then decrease to once daily if her swelling is under control. I also prescribed 10-300 mg hydrocodone -acetaminophen  every 4 hours PRN to control her pain since the oxycodone 10 mg seems too strong, but 5 mg was not strong enough. She is scheduled for day 1, cycle 3 of carboplatin  + etoposide  + atezolizumab  on 02/28/2024. If she does not feel that she can tolerate this treatment, I informed her to let us  know and we will postpone this one week. I will see her back one week later with CBC, CMP, and magnesium . She denies fever, chills, night sweats, or other signs of infection. She denies cardiorespiratory and gastrointestinal issues. She  denies pain. Her appetite is not good and Her weight has increased 5 pounds over last 7 daysand she is taking diuretics. She is accompanied by her daughter.   HISTORY:   Past Medical History:  Diagnosis Date   Advanced nonexudative age-related macular  degeneration of right eye with subfoveal involvement 09/21/2019   Advanced nonexudative age-related macular degeneration of right eye without subfoveal involvement 09/21/2019   Allergic rhinitis    Angina pectoris 05/17/2020   Bilateral breast cancer (HCC) 11/23/2013   BMI 34.0-34.9,adult 09/25/2019   Breast cancer (HCC)    Bronchitis, chronic (HCC)    CAD (coronary artery disease)    Cancer (HCC)    Cardiac murmur 05/17/2020   Coronary artery calcification 05/17/2020   Degenerative retinal drusen of right eye 09/21/2019   Diabetes mellitus due to underlying condition with unspecified complications (HCC) 05/17/2020   Diverticulosis    Dry eyes 06/25/2016   Dyspnea    Emphysema/COPD    Essential hypertension 05/17/2020   Estrogen receptor positive neoplasm 09/24/2015   Family history of malignant neoplasm of breast  11/23/2013   Family history of peritoneal cancer 11/23/2013   Fuchs' corneal dystrophy 11/03/2016   GERD (gastroesophageal reflux disease)    History of colon polyps    History of right breast cancer 09/19/2018   Hypertension    IBS (irritable bowel syndrome)    with D   Intermediate stage nonexudative age-related macular degeneration of left eye 09/21/2019   Iritis 11/03/2016   Macular degeneration    Major depressive disorder    Malignant neoplasm of upper-inner quadrant of left breast in female, estrogen receptor positive (HCC) 09/01/2016   Mixed dyslipidemia 05/17/2020   Obesity    Obesity (BMI 35.0-39.9 without comorbidity) 05/17/2020   OSA on CPAP 10/29/2020   Diagnosed some 3 months prior after pneumonia episode, Dr. Shellia pulmonology   Peripheral vascular disease    Personal history of chemotherapy    Personal history of radiation therapy    Pseudophakia 09/21/2019   Right-sided chest wall pain    SVT (supraventricular tachycardia) 05/23/2020   Varicose veins of left lower extremity with complications 08/11/2016   Past Surgical History:  Procedure Laterality Date   BREAST BIOPSY     BREAST LUMPECTOMY     right brwast 2005/ left 2015   CATARACT EXTRACTION     CHOLECYSTECTOMY     COLONOSCOPY  04/17/2015   Colonic polyps status post polypectomy. Moderate to severe sigmoid diverticulosis   ENDOVENOUS ABLATION SAPHENOUS VEIN W/ LASER Right 02/19/2023   endovenous laser ablation right greater saphenous vein and stab phlebectomy 10-20 incisions right leg by Gaile New MD   TUBAL LIGATION     VAGINAL HYSTERECTOMY     VEIN LIGATION AND STRIPPING     Family History  Problem Relation Age of Onset   Cancer Mother 58       bilateral breast cancer ages 3 then 82. Peritoneal cancer at age 51.   Other Mother        Ms. Dubach's mother was adopted so have no information regarding maternal family  history other than her mother   Breast cancer Mother    Cancer Father 7        renal   Cancer Sister 25       breast   Breast cancer Sister    Breast cancer Daughter    Breast cancer Maternal Aunt    Breast cancer Paternal Aunt    Breast cancer Paternal Grandmother    Cancer Paternal Grandmother        bilateral breast cancer at unknown age   Social History:  reports that she quit smoking about 43 years ago. Her smoking use included cigarettes. She started smoking about 58 years ago. She has a  37.5 pack-year smoking history. She has never used smokeless tobacco. She reports current alcohol use. She reports that she does not use drugs.The patient is accompanied by her daughter and granddaughter today.  Allergies:  Allergies  Allergen Reactions   Codeine Nausea And Vomiting and Other (See Comments)    Other reaction(s): Other (see comments), Vomiting   Levofloxacin Other (See Comments)    Leg cramps   Nexlizet  [Bempedoic Acid-Ezetimibe ] Other (See Comments)   Pitavastatin  Other (See Comments)    Leg cramps  Other Reaction(s): Other (See Comments)  Leg cramps    Leg cramps   Rosuvastatin  Other (See Comments)    Muscle pain    Current Medications: Current Outpatient Medications  Medication Sig Dispense Refill   ondansetron  (ZOFRAN -ODT) 8 MG disintegrating tablet Take by mouth.     albuterol  (VENTOLIN  HFA) 108 (90 Base) MCG/ACT inhaler Inhale 2 puffs into the lungs every 4 (four) hours as needed for wheezing.     allopurinol  (ZYLOPRIM ) 300 MG tablet Take 1 tablet (300 mg total) by mouth daily. (Patient not taking: Reported on 03/07/2024) 30 tablet 0   Biotin 89999 MCG TABS Take 10,000 mcg by mouth daily.     budesonide -formoterol  (SYMBICORT ) 80-4.5 MCG/ACT inhaler Inhale 2 puffs into the lungs 2 (two) times daily.     chlorpheniramine-HYDROcodone  (TUSSIONEX) 10-8 MG/5ML Take 5 mLs by mouth every 12 (twelve) hours as needed for cough. 473 mL 0   diltiazem  (CARDIZEM  CD) 120 MG 24 hr capsule Take 1 capsule (120 mg total) by mouth daily. 90 capsule 0    fluocinonide cream (LIDEX) 0.05 % Apply 1 Application topically 3 (three) times daily. (Patient not taking: Reported on 03/07/2024)     gabapentin (NEURONTIN) 600 MG tablet Take 600 mg by mouth 2 (two) times daily.     GARLIC PO Take 1 tablet by mouth daily.     HYDROcodone -Acetaminophen  10-300 MG TABS Take 1 tablet by mouth every 4 (four) hours as needed. 100 tablet 0   ipratropium-albuterol  (DUONEB) 0.5-2.5 (3) MG/3ML SOLN Take 3 mLs by nebulization every 4 (four) hours as needed (breathing treatment).     loratadine (CLARITIN) 10 MG tablet Take 10 mg by mouth daily.     LORazepam  (ATIVAN ) 0.5 MG tablet Take by mouth.     magic mouthwash w/lidocaine  SOLN Take 5 mLs by mouth 4 (four) times daily. Suspension contains equal amounts of Maalox Extra Strength, nystatin , diphenhydramine  and lidocaine . (Patient not taking: Reported on 03/07/2024)     magnesium  oxide (MAG-OX) 400 (240 Mg) MG tablet Take 1 tablet (400 mg total) by mouth daily. 30 tablet 5   metFORMIN (GLUCOPHAGE) 500 MG tablet Take 500 mg by mouth daily.     metoprolol  tartrate (LOPRESSOR ) 100 MG tablet Take 150 mg by mouth 2 (two) times daily.     montelukast (SINGULAIR) 10 MG tablet Take 10 mg by mouth at bedtime.     Multiple Vitamins-Minerals (PRESERVISION AREDS 2 PO) Take by mouth.     nitroGLYCERIN  (NITROSTAT ) 0.4 MG SL tablet Place 0.4 mg under the tongue every 5 (five) minutes as needed for chest pain.     nystatin  (MYCOSTATIN /NYSTOP ) powder Apply 1 Application topically 3 (three) times daily. (Patient not taking: Reported on 03/07/2024) 15 g 0   ondansetron  (ZOFRAN ) 4 MG tablet Take 1 tablet (4 mg total) by mouth every 4 (four) hours as needed for nausea. 90 tablet 3   OXYGEN  Inhale 2.5 L/min into the lungs continuous.     pantoprazole  (PROTONIX )  40 MG tablet Take 1 tablet (40 mg total) by mouth daily. Call [phone] to schedule an office visit for more refills 90 tablet 0   potassium chloride  SA (KLOR-CON  M) 20 MEQ tablet Take  1 tablet (20 mEq total) by mouth daily. 30 tablet 5   prochlorperazine  (COMPAZINE ) 10 MG tablet Take 1 tablet (10 mg total) by mouth every 6 (six) hours as needed for nausea or vomiting. 90 tablet 3   sertraline (ZOLOFT) 50 MG tablet Take 50 mg by mouth daily.     torsemide  (DEMADEX ) 20 MG tablet Take 2 tablets (40 mg total) by mouth daily. 60 tablet 5   vitamin E 180 MG (400 UNITS) capsule Take 400 Units by mouth daily.     zinc gluconate 50 MG tablet Take 50 mg by mouth daily.     No current facility-administered medications for this visit.   Facility-Administered Medications Ordered in Other Visits  Medication Dose Route Frequency Provider Last Rate Last Admin   0.9 %  sodium chloride  infusion (Manually program via Guardrails IV Fluids)  250 mL Intravenous Continuous Harl Setter A, NP   Stopped at 03/07/24 1528   0.9 %  sodium chloride  infusion   Intravenous Continuous Harl Setter A, NP   Stopped at 03/07/24 1243   0.9 %  sodium chloride  infusion   Intravenous Continuous Harl Setter LABOR, NP   Stopped at 03/07/24 1256    REVIEW OF SYSTEMS:  Review of Systems  Constitutional: Negative.  Negative for appetite change, chills, diaphoresis, fatigue, fever and unexpected weight change.  HENT:   Positive for nosebleeds. Negative for hearing loss, lump/mass, mouth sores, sore throat, tinnitus, trouble swallowing and voice change.   Eyes:  Positive for eye problems (vision changes).  Respiratory:  Positive for cough and shortness of breath. Negative for chest tightness, hemoptysis and wheezing.   Cardiovascular:  Positive for chest pain (pleuritic type) and leg swelling (bilateral). Negative for palpitations.  Gastrointestinal:  Negative for abdominal distention, abdominal pain, blood in stool, constipation, diarrhea, nausea, rectal pain and vomiting.       Bowel incontinence upon straining  Endocrine: Negative.   Genitourinary:  Positive for bladder incontinence (upon straining).  Negative for difficulty urinating, dyspareunia, dysuria, frequency, hematuria, menstrual problem, nocturia, pelvic pain, vaginal bleeding and vaginal discharge.   Musculoskeletal: Negative.  Negative for arthralgias, back pain, flank pain, gait problem, myalgias, neck pain and neck stiffness.  Skin: Negative.  Negative for itching, rash and wound.  Neurological:  Positive for headaches and numbness (neuropathy of bilateral feet). Negative for dizziness, extremity weakness, gait problem, light-headedness, seizures and speech difficulty.       Tremor  Hematological:  Negative for adenopathy. Bruises/bleeds easily.  Psychiatric/Behavioral: Negative.  Negative for depression and sleep disturbance. The patient is not nervous/anxious.    VITALS:  Blood pressure 118/64, pulse 82, temperature 98.3 F (36.8 C), temperature source Oral, resp. rate 20, height 5' 5 (1.651 m), weight 211 lb (95.7 kg), SpO2 99%.  Wt Readings from Last 3 Encounters:  03/07/24 200 lb 12.8 oz (91.1 kg)  02/28/24 209 lb 3.2 oz (94.9 kg)  02/24/24 211 lb (95.7 kg)    Body mass index is 35.11 kg/m.  Performance status (ECOG): 1 - Symptomatic but completely ambulatory  PHYSICAL EXAM:  Physical Exam Vitals and nursing note reviewed. Exam conducted with a chaperone present.  Constitutional:      General: She is not in acute distress.    Appearance: Normal appearance. She is normal  weight. She is not ill-appearing, toxic-appearing or diaphoretic.  HENT:     Head: Normocephalic and atraumatic.     Right Ear: Tympanic membrane, ear canal and external ear normal. There is no impacted cerumen.     Left Ear: Tympanic membrane, ear canal and external ear normal. There is no impacted cerumen.     Nose: Nose normal. No congestion or rhinorrhea.     Mouth/Throat:     Mouth: Mucous membranes are moist.     Pharynx: Oropharynx is clear. No oropharyngeal exudate or posterior oropharyngeal erythema.  Eyes:     General: No scleral  icterus.       Right eye: No discharge.        Left eye: No discharge.     Extraocular Movements: Extraocular movements intact.     Conjunctiva/sclera: Conjunctivae normal.     Pupils: Pupils are equal, round, and reactive to light.  Cardiovascular:     Rate and Rhythm: Normal rate and regular rhythm.     Pulses: Normal pulses.     Heart sounds: Normal heart sounds. No murmur heard.    No friction rub. No gallop.  Pulmonary:     Effort: Pulmonary effort is normal. No respiratory distress.     Breath sounds: Examination of the right-lower field reveals decreased breath sounds. Examination of the left-lower field reveals decreased breath sounds. Decreased breath sounds present.  Abdominal:     General: Bowel sounds are normal. There is no distension.     Palpations: Abdomen is soft. There is no hepatomegaly, splenomegaly or mass.     Tenderness: There is no abdominal tenderness. There is no right CVA tenderness, left CVA tenderness, guarding or rebound.     Hernia: No hernia is present.  Musculoskeletal:        General: Normal range of motion.     Cervical back: Normal range of motion and neck supple.     Right lower leg: Edema (1-2+) present.     Left lower leg: Edema (1-2+) present.     Comments: Bilateral lower leg 1-2+ edema  Lymphadenopathy:     Cervical: No cervical adenopathy.     Right cervical: No superficial, deep or posterior cervical adenopathy.    Left cervical: No superficial, deep or posterior cervical adenopathy.     Upper Body:     Right upper body: No supraclavicular, axillary or pectoral adenopathy.     Left upper body: No supraclavicular, axillary or pectoral adenopathy.  Skin:    General: Skin is warm and dry.  Neurological:     General: No focal deficit present.     Mental Status: She is alert and oriented to person, place, and time. Mental status is at baseline.  Psychiatric:        Mood and Affect: Mood normal.        Behavior: Behavior normal.         Thought Content: Thought content normal.        Judgment: Judgment normal.    LABS:      Latest Ref Rng & Units 03/07/2024    9:26 AM 02/28/2024    9:00 AM 02/16/2024   12:27 PM  CBC  WBC 4.0 - 10.5 K/uL 3.9  12.3  9.5   Hemoglobin 12.0 - 15.0 g/dL 7.4  8.8  89.8   Hematocrit 36.0 - 46.0 % 23.1  28.0  31.7   Platelets 150 - 400 K/uL 110  151  187  Latest Ref Rng & Units 03/07/2024    9:26 AM 02/28/2024    9:00 AM 02/16/2024   12:27 PM  CMP  Glucose 70 - 99 mg/dL 830  781  897   BUN 8 - 23 mg/dL 20  17  20    Creatinine 0.44 - 1.00 mg/dL 9.27  9.15  9.23   Sodium 135 - 145 mmol/L 137  138  137   Potassium 3.5 - 5.1 mmol/L 3.4  3.5  4.0   Chloride 98 - 111 mmol/L 97  92  97   CO2 22 - 32 mmol/L 29  36  31   Calcium  8.9 - 10.3 mg/dL 9.6  9.5  9.2   Total Protein 6.5 - 8.1 g/dL 6.4  6.8  6.3   Total Bilirubin 0.0 - 1.2 mg/dL 0.5  0.2  0.5   Alkaline Phos 38 - 126 U/L 164  169  137   AST 15 - 41 U/L 22  22  18    ALT 0 - 44 U/L 24  22  22       Lab Results  Component Value Date   CEA 11.87 (H) 01/06/2024   /  CEA [facility])  Date Value Ref Range Status  01/06/2024 11.87 (H) 0.00 - 5.00 ng/mL Final    Comment:    (NOTE) This test was performed using Beckman Coulter's paramagnetic chemiluminescent immunoassay. Values obtained from different assay methods cannot be used interchangeably. Please note that up to 8% of patients who smoke may see values 5.1-10.0 ng/ml and 1% of patients who smoke may see CEA levels >10.0 ng/ml. Performed at [facility], [address] [place], [place] [address]    No results found for: PSA1 No results found for: CAN199 No results found for: CAN125  No results found for: TOTALPROTELP, ALBUMINELP, A1GS, A2GS, BETS, BETA2SER, GAMS, MSPIKE, SPEI Lab Results  Component Value Date   TIBC 316 01/10/2024   FERRITIN 297 01/10/2024   IRONPCTSAT 27 01/10/2024   No results found for:  LDH  STUDIES:  EXAM: 02/23/2024 CT ANGIO CHEST IMPRESSION: (No mediastinal lymph nodes are seen) 1. No evidence of pulmonary thromboembolism. 2. Minimal pericardial effusion measuring up to 5 mm. 3. Minimal to mild left pleural effusion with subpleural atelectasis in the posterior basal segments of both lower lung lobes. 4. Visualized centrilobular emphysema predominantly in bilateral upper lung lobes. 5. Subpleural fibroatelectatic changes involving bilateral upper lung lobes, right middle love, lingular lobe, and bilateral lower lung lobes. 6. Mild left ventricular wall hypertrophy. 7. Atherosclerotic wall calcifications in the aorta and coronary arteries without evidence of aneurysm or dissection. 8. 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   EXAM: 01/18/2024 CT ANGIO CHEST IMPRESSION: Negative for pulmonary embolus. Extensive right hilar and mediastinal masses with mass effect as well as encompassing right hilar structures is unchanged. Peripheral right upper lobe mass. Extensive hepatic metastatic disease, likely relatively stable.   EXAM: 01/05/2024 MRI HEAD WITH WITHOUT CM IMPRESSION: Relatively age-appropriate atrophy and mall-vessel disease  EXAM: 01/05/2024 PET SKULL BASE TO MID-THIGH IMPRESSION: Irregular solid noncalcified peripheral right upper lobe mass likely primary neoplasm. Extensive mediastinal and right hilar metastatic adenopathy. Extensive hepatic metastatic disease. Probable single metastatic lesion in the left proximal femur. No definitive adrenal metastatic disease.   Exam: 12/29/2023 Pathology Impression: Lung, right upper lobe, needle core biopsy: Small Cell Carcinoma The tumor cells are positive for TTF-1 and neuroendocrine markers  CD56, synaptophysin, and chromogranin. GATA-3 is negative, Pan keratin shows dot-like positive staining.   I,  Aretta Cook, acting as a scribe for [name], MD, have documented all relevant documentation on the behalf of [name], MD, as directed by [name], MD, while in the presence of [name], MD.  I have reviewed this report as typed by the medical scribe, and it is complete and accurate.  "

## 2024-02-28 ENCOUNTER — Other Ambulatory Visit: Payer: Self-pay | Admitting: Hematology and Oncology

## 2024-02-28 ENCOUNTER — Encounter

## 2024-02-28 ENCOUNTER — Inpatient Hospital Stay: Admitting: Hematology and Oncology

## 2024-02-28 ENCOUNTER — Ambulatory Visit: Admitting: Internal Medicine

## 2024-02-28 ENCOUNTER — Inpatient Hospital Stay

## 2024-02-28 VITALS — BP 124/63 | HR 99 | Temp 98.2°F | Resp 20 | Ht 65.0 in | Wt 209.2 lb

## 2024-02-28 DIAGNOSIS — C3430 Malignant neoplasm of lower lobe, unspecified bronchus or lung: Secondary | ICD-10-CM

## 2024-02-28 DIAGNOSIS — Z5111 Encounter for antineoplastic chemotherapy: Secondary | ICD-10-CM | POA: Diagnosis not present

## 2024-02-28 LAB — CBC WITH DIFFERENTIAL (CANCER CENTER ONLY)
Abs Immature Granulocytes: 0.9 K/uL — ABNORMAL HIGH (ref 0.00–0.07)
Basophils Absolute: 0.1 K/uL (ref 0.0–0.1)
Basophils Relative: 1 %
Eosinophils Absolute: 0.1 K/uL (ref 0.0–0.5)
Eosinophils Relative: 1 %
HCT: 28 % — ABNORMAL LOW (ref 36.0–46.0)
Hemoglobin: 8.8 g/dL — ABNORMAL LOW (ref 12.0–15.0)
Immature Granulocytes: 7 %
Lymphocytes Relative: 10 %
Lymphs Abs: 1.2 K/uL (ref 0.7–4.0)
MCH: 29.8 pg (ref 26.0–34.0)
MCHC: 31.4 g/dL (ref 30.0–36.0)
MCV: 94.9 fL (ref 80.0–100.0)
Monocytes Absolute: 1 K/uL (ref 0.1–1.0)
Monocytes Relative: 9 %
Neutro Abs: 9 K/uL — ABNORMAL HIGH (ref 1.7–7.7)
Neutrophils Relative %: 72 %
Platelet Count: 151 K/uL (ref 150–400)
RBC: 2.95 MIL/uL — ABNORMAL LOW (ref 3.87–5.11)
RDW: 16.6 % — ABNORMAL HIGH (ref 11.5–15.5)
Smear Review: NORMAL
WBC Count: 12.3 K/uL — ABNORMAL HIGH (ref 4.0–10.5)
nRBC: 0.4 % — ABNORMAL HIGH (ref 0.0–0.2)

## 2024-02-28 LAB — CMP (CANCER CENTER ONLY)
ALT: 22 U/L (ref 0–44)
AST: 22 U/L (ref 15–41)
Albumin: 3.5 g/dL (ref 3.5–5.0)
Alkaline Phosphatase: 169 U/L — ABNORMAL HIGH (ref 38–126)
Anion gap: 10 (ref 5–15)
BUN: 17 mg/dL (ref 8–23)
CO2: 36 mmol/L — ABNORMAL HIGH (ref 22–32)
Calcium: 9.5 mg/dL (ref 8.9–10.3)
Chloride: 92 mmol/L — ABNORMAL LOW (ref 98–111)
Creatinine: 0.84 mg/dL (ref 0.44–1.00)
GFR, Estimated: >60 mL/min
Glucose, Bld: 218 mg/dL — ABNORMAL HIGH (ref 70–99)
Potassium: 3.5 mmol/L (ref 3.5–5.1)
Sodium: 138 mmol/L (ref 135–145)
Total Bilirubin: 0.2 mg/dL (ref 0.0–1.2)
Total Protein: 6.8 g/dL (ref 6.5–8.1)

## 2024-02-28 LAB — TSH: TSH: 4.6 u[IU]/mL — ABNORMAL HIGH (ref 0.350–4.500)

## 2024-02-28 MED ORDER — DILTIAZEM HCL ER COATED BEADS 120 MG PO CP24: 120.0000 mg | ORAL_CAPSULE | Freq: Every day | ORAL | 0 refills | Status: AC

## 2024-02-28 MED ORDER — SODIUM CHLORIDE 0.9% FLUSH: 10.0000 mL | INTRAVENOUS | Status: DC | PRN

## 2024-02-28 MED ORDER — PALONOSETRON HCL INJECTION 0.25 MG/5ML
0.2500 mg | Freq: Once | INTRAVENOUS | Status: AC
  Administered 2024-02-28: 0.25 mg via INTRAVENOUS
  Filled 2024-02-28: qty 5

## 2024-02-28 MED ORDER — DEXAMETHASONE SOD PHOSPHATE PF 10 MG/ML IJ SOLN
10.0000 mg | Freq: Once | INTRAMUSCULAR | Status: AC
  Administered 2024-02-28: 10 mg via INTRAVENOUS

## 2024-02-28 MED ORDER — SODIUM CHLORIDE 0.9 % IV SOLN
100.0000 mg/m2 | Freq: Once | INTRAVENOUS | Status: AC
  Administered 2024-02-28: 200 mg via INTRAVENOUS
  Filled 2024-02-28: qty 10

## 2024-02-28 MED ORDER — SODIUM CHLORIDE 0.9 % IV SOLN
1200.0000 mg | Freq: Once | INTRAVENOUS | Status: AC
  Administered 2024-02-28: 1200 mg via INTRAVENOUS
  Filled 2024-02-28: qty 20

## 2024-02-28 MED ORDER — APREPITANT 130 MG/18ML IV EMUL
130.0000 mg | Freq: Once | INTRAVENOUS | Status: AC
  Administered 2024-02-28: 130 mg via INTRAVENOUS
  Filled 2024-02-28: qty 18

## 2024-02-28 MED ORDER — SODIUM CHLORIDE 0.9 % IV SOLN
434.2500 mg | Freq: Once | INTRAVENOUS | Status: AC
  Administered 2024-02-28: 430 mg via INTRAVENOUS
  Filled 2024-02-28: qty 43

## 2024-02-28 MED ORDER — SODIUM CHLORIDE 0.9 % IV SOLN: INTRAVENOUS | Status: DC

## 2024-02-28 MED FILL — Etoposide Inj 1 GM/50ML (20 MG/ML): INTRAVENOUS | Qty: 10 | Status: AC

## 2024-02-28 NOTE — Progress Notes (Signed)
 Per Devere Plana RN- ok to release chemo without chemistry results back- Verified with Eleanor Bach NP

## 2024-02-28 NOTE — Patient Instructions (Signed)
 Etoposide Injection What is this medication? ETOPOSIDE (e toe POE side) treats some types of cancer. It works by slowing down the growth of cancer cells. This medicine may be used for other purposes; ask your health care provider or pharmacist if you have questions. COMMON BRAND NAME(S): Etopophos, Toposar, VePesid What should I tell my care team before I take this medication? They need to know if you have any of these conditions: Infection Kidney disease Liver disease Low blood counts, such as low white cell, platelet, red cell counts An unusual or allergic reaction to etoposide, other medications, foods, dyes, or preservatives If you or your partner are pregnant or trying to get pregnant Breastfeeding How should I use this medication? This medication is injected into a vein. It is given by your care team in a hospital or clinic setting. Talk to your care team about the use of this medication in children. Special care may be needed. Overdosage: If you think you have taken too much of this medicine contact a poison control center or emergency room at once. NOTE: This medicine is only for you. Do not share this medicine with others. What if I miss a dose? Keep appointments for follow-up doses. It is important not to miss your dose. Call your care team if you are unable to keep an appointment. What may interact with this medication? Warfarin This list may not describe all possible interactions. Give your health care provider a list of all the medicines, herbs, non-prescription drugs, or dietary supplements you use. Also tell them if you smoke, drink alcohol, or use illegal drugs. Some items may interact with your medicine. What should I watch for while using this medication? Your condition will be monitored carefully while you are receiving this medication. This medication may make you feel generally unwell. This is not uncommon as chemotherapy can affect healthy cells as well as cancer  cells. Report any side effects. Continue your course of treatment even though you feel ill unless your care team tells you to stop. This medication can cause serious side effects. To reduce the risk, your care team may give you other medications to take before receiving this one. Be sure to follow the directions from your care team. This medication may increase your risk of getting an infection. Call your care team for advice if you get a fever, chills, sore throat, or other symptoms of a cold or flu. Do not treat yourself. Try to avoid being around people who are sick. This medication may increase your risk to bruise or bleed. Call your care team if you notice any unusual bleeding. Talk to your care team about your risk of cancer. You may be more at risk for certain types of cancers if you take this medication. Talk to your care team if you may be pregnant. Serious birth defects can occur if you take this medication during pregnancy and for 6 months after the last dose. You will need a negative pregnancy test before starting this medication. Contraception is recommended while taking this medication and for 6 months after the last dose. Your care team can help you find the option that works for you. If your partner can get pregnant, use a condom during sex while taking this medication and for 4 months after the last dose. Do not breastfeed while taking this medication. This medication may cause infertility. Talk to your care team if you are concerned about your fertility. What side effects may I notice from receiving this medication?  Side effects that you should report to your care team as soon as possible: Allergic reactions--skin rash, itching, hives, swelling of the face, lips, tongue, or throat Infection--fever, chills, cough, sore throat, wounds that don't heal, pain or trouble when passing urine, general feeling of discomfort or being unwell Low red blood cell level--unusual weakness or fatigue,  dizziness, headache, trouble breathing Unusual bruising or bleeding Side effects that usually do not require medical attention (report to your care team if they continue or are bothersome): Diarrhea Fatigue Hair loss Loss of appetite Nausea Vomiting This list may not describe all possible side effects. Call your doctor for medical advice about side effects. You may report side effects to FDA at 1-800-FDA-1088. Where should I keep my medication? This medication is given in a hospital or clinic. It will not be stored at home. NOTE: This sheet is a summary. It may not cover all possible information. If you have questions about this medicine, talk to your doctor, pharmacist, or health care provider.  2024 Elsevier/Gold Standard (2021-07-17 00:00:00)Carboplatin Injection What is this medication? CARBOPLATIN (KAR boe pla tin) treats some types of cancer. It works by slowing down the growth of cancer cells. This medicine may be used for other purposes; ask your health care provider or pharmacist if you have questions. COMMON BRAND NAME(S): Paraplatin What should I tell my care team before I take this medication? They need to know if you have any of these conditions: Blood disorders Hearing problems Kidney disease Recent or ongoing radiation therapy An unusual or allergic reaction to carboplatin, cisplatin, other medications, foods, dyes, or preservatives Pregnant or trying to get pregnant Breast-feeding How should I use this medication? This medication is injected into a vein. It is given by your care team in a hospital or clinic setting. Talk to your care team about the use of this medication in children. Special care may be needed. Overdosage: If you think you have taken too much of this medicine contact a poison control center or emergency room at once. NOTE: This medicine is only for you. Do not share this medicine with others. What if I miss a dose? Keep appointments for follow-up  doses. It is important not to miss your dose. Call your care team if you are unable to keep an appointment. What may interact with this medication? Medications for seizures Some antibiotics, such as amikacin, gentamicin, neomycin, streptomycin, tobramycin Vaccines This list may not describe all possible interactions. Give your health care provider a list of all the medicines, herbs, non-prescription drugs, or dietary supplements you use. Also tell them if you smoke, drink alcohol, or use illegal drugs. Some items may interact with your medicine. What should I watch for while using this medication? Your condition will be monitored carefully while you are receiving this medication. You may need blood work while taking this medication. This medication may make you feel generally unwell. This is not uncommon, as chemotherapy can affect healthy cells as well as cancer cells. Report any side effects. Continue your course of treatment even though you feel ill unless your care team tells you to stop. In some cases, you may be given additional medications to help with side effects. Follow all directions for their use. This medication may increase your risk of getting an infection. Call your care team for advice if you get a fever, chills, sore throat, or other symptoms of a cold or flu. Do not treat yourself. Try to avoid being around people who are sick. Avoid  taking medications that contain aspirin, acetaminophen, ibuprofen, naproxen, or ketoprofen unless instructed by your care team. These medications may hide a fever. Be careful brushing or flossing your teeth or using a toothpick because you may get an infection or bleed more easily. If you have any dental work done, tell your dentist you are receiving this medication. Talk to your care team if you wish to become pregnant or think you might be pregnant. This medication can cause serious birth defects. Talk to your care team about effective forms of  contraception. Do not breast-feed while taking this medication. What side effects may I notice from receiving this medication? Side effects that you should report to your care team as soon as possible: Allergic reactions--skin rash, itching, hives, swelling of the face, lips, tongue, or throat Infection--fever, chills, cough, sore throat, wounds that don't heal, pain or trouble when passing urine, general feeling of discomfort or being unwell Low red blood cell level--unusual weakness or fatigue, dizziness, headache, trouble breathing Pain, tingling, or numbness in the hands or feet, muscle weakness, change in vision, confusion or trouble speaking, loss of balance or coordination, trouble walking, seizures Unusual bruising or bleeding Side effects that usually do not require medical attention (report to your care team if they continue or are bothersome): Hair loss Nausea Unusual weakness or fatigue Vomiting This list may not describe all possible side effects. Call your doctor for medical advice about side effects. You may report side effects to FDA at 1-800-FDA-1088. Where should I keep my medication? This medication is given in a hospital or clinic. It will not be stored at home. NOTE: This sheet is a summary. It may not cover all possible information. If you have questions about this medicine, talk to your doctor, pharmacist, or health care provider.  2024 Elsevier/Gold Standard (2021-06-17 00:00:00)Atezolizumab Injection What is this medication? ATEZOLIZUMAB (a te zoe LIZ ue mab) treats some types of cancer. It works by helping your immune system slow or stop the spread of cancer cells. It is a monoclonal antibody. This medicine may be used for other purposes; ask your health care provider or pharmacist if you have questions. COMMON BRAND NAME(S): Tecentriq What should I tell my care team before I take this medication? They need to know if you have any of these conditions: Allogeneic  stem cell transplant (uses someone else's stem cells) Autoimmune diseases, such as Crohn disease, ulcerative colitis, lupus History of chest radiation Nervous system problems, such as Guillain-Barre syndrome, myasthenia gravis Organ transplant An unusual or allergic reaction to atezolizumab, other medications, foods, dyes, or preservatives Pregnant or trying to get pregnant Breast-feeding How should I use this medication? This medication is injected into a vein. It is given by your care team in a hospital or clinic setting. A special MedGuide will be given to you before each treatment. Be sure to read this information carefully each time. Talk to your care team about the use of this medication in children. While it may be prescribed for children as young as 2 years for selected conditions, precautions do apply. Overdosage: If you think you have taken too much of this medicine contact a poison control center or emergency room at once. NOTE: This medicine is only for you. Do not share this medicine with others. What if I miss a dose? Keep appointments for follow-up doses. It is important not to miss your dose. Call your care team if you are unable to keep an appointment. What may interact with this medication?  Interactions have not been studied. This list may not describe all possible interactions. Give your health care provider a list of all the medicines, herbs, non-prescription drugs, or dietary supplements you use. Also tell them if you smoke, drink alcohol, or use illegal drugs. Some items may interact with your medicine. What should I watch for while using this medication? Your condition will be monitored carefully while you are receiving this medication. You may need blood work done while you are taking this medication. This medication may cause serious skin reactions. They can happen weeks to months after starting the medication. Contact your care team right away if you notice fevers or  flu-like symptoms with a rash. The rash may be red or purple and then turn into blisters or peeling of the skin. You may also notice a red rash with swelling of the face, lips, or lymph nodes in your neck or under your arms. Talk to your care team if you may be pregnant. Serious birth defects can occur if you take this medication during pregnancy and for 5 months after the last dose. You will need a negative pregnancy test before starting this medication. Contraception is recommended while taking this medication and for 5 months after the last dose. Your care team can help you find the option that works for you. Do not breastfeed while taking this medication and for 5 months after the last dose. This medication may cause infertility. Talk to your care team if you are concerned about your fertility. What side effects may I notice from receiving this medication? Side effects that you should report to your doctor or health care professional as soon as possible: Allergic reactions--skin rash, itching, hives, swelling of the face, lips, tongue, or throat Dry cough, shortness of breath or trouble breathing Eye pain, redness, irritation, or discharge with blurry or decreased vision Heart muscle inflammation--unusual weakness or fatigue, shortness of breath, chest pain, fast or irregular heartbeat, dizziness, swelling of the ankles, feet, or hands Hormone gland problems--headache, sensitivity to light, unusual weakness or fatigue, dizziness, fast or irregular heartbeat, increased sensitivity to cold or heat, excessive sweating, constipation, hair loss, increased thirst or amount of urine, tremors or shaking, irritability Infusion reactions--chest pain, shortness of breath or trouble breathing, feeling faint or lightheaded Kidney injury (glomerulonephritis)--decrease in the amount of urine, red or dark brown urine, foamy or bubbly urine, swelling of the ankles, hands, or feet Liver injury--right upper belly  pain, loss of appetite, nausea, light-colored stool, dark yellow or brown urine, yellowing skin or eyes, unusual weakness or fatigue Pain, tingling, or numbness in the hands or feet, muscle weakness, change in vision, confusion or trouble speaking, loss of balance or coordination, trouble walking, seizures Rash, fever, and swollen lymph nodes Redness, blistering, peeling, or loosening of the skin, including inside the mouth Sudden or severe stomach pain, bloody diarrhea, fever, nausea, vomiting Side effects that usually do not require medical attention (report to your doctor or health care professional if they continue or are bothersome): Bone, joint, or muscle pain Diarrhea Fatigue Loss of appetite Nausea Skin rash This list may not describe all possible side effects. Call your doctor for medical advice about side effects. You may report side effects to FDA at 1-800-FDA-1088. Where should I keep my medication? This medication is given in a hospital or clinic. It will not be stored at home. NOTE: This sheet is a summary. It may not cover all possible information. If you have questions about this medicine, talk to  your doctor, pharmacist, or health care provider.  2024 Elsevier/Gold Standard (2022-12-09 00:00:00)

## 2024-02-28 NOTE — Progress Notes (Signed)
 SCr = 0.8; LFT's wnl at [facility] on 02/23/24.  Reviewed labs w/ Olam Eans, RN.  Betania Dizon, Pharm.D., CPP 02/28/2024@10 :55 AM

## 2024-02-28 NOTE — Progress Notes (Signed)
 " [facility]  [address] [place],  [place]  [address] [phone]  Clinic Day: 02/24/2024  Referring physician: [name], MD  ASSESSMENT & PLAN:  Assessment:  Small Cell Lung Cancer This is newly diagnosed but found to be stage IV with liver metastases and significant bulky mediastinal adenopathy, right pleural based mass, and small right pleural effusion. She has been started on chemotherapy with Carboplatin , Etoposide , and Atezolizumab , with her first cycle given 6 weeks ago. She has had a rough time with cytopenias and ended up in the hospital twice but is over the worst. She did better with her second cycle but developed this pleuritic chest pain over the last few days. Dr. Cornelius reassured her that the cancer is responding and we want to continue with her treatment. Dr. Cornelius reviewed all the plans with the patient her daughter and we plan close follow-up and supportive care with weekly labs and visits.   Superior Vena Cava Syndrome Due to this, palliative radiation was added and is helping. She had increased pain in the upper mediastinum earlier this week, but that has improved and she is tapering off the oxycodone. The edema of her arms has resolved and neck and face are greatly decreased after palliative radiation.  Hyponatremia This is likely caused by SIADH from her small cell lung cancer and will be monitored closely. Her sodium is finally coming up now and was normal for the first time 2 weeks ago. Today it is mildly lower at 133. I think it is responding to the treatment for her lung cancer.   Hypomagnesemia She has received IV magnesium  on multiple occasions and we will continue to monitor this and supplement as needed. She is on oral 400 mg magnesium  oxide supplement. If her level continues to remain low, we may need to increase this to BID.  Pneumonia This was multifocal in November and at least partially related to post obstruction of the right lower  main bronchus. We do not see these infiltrates on her current CT scan, but she does have some atelectasis bilaterally.  Anemia This is likely related to her new small cell lung cancer and bilateral pneumonia. Her vitamin levels were all normal. Her hemoglobin today is 8.8.   Neutropenia She had profound neutropenia with her first cycle, with an ANC down to 100 at her nadir but she did not receive Neulasta  due to the radiation. I have reviewed neutropenic precautions with her and her daughter, and her counts did recover. We were able to use Neulasta  with this second cycle. WBC today is 12.3.  Thrombocytopenia Her platelet count is 151 today.   History of Stage IA (T1b N0 M0) hormone receptor positive right breast cancer This was diagnosed in April, 2005. She was treated with a lumpectomy and pathology revealed a 6 mm, grade 3, invasive ductal carcinoma with sentinel node positive with a 3 mm micrometastasis. Estrogen and progesterone receptors were positive and her 2 neu negative. She was treated with 4 cycles of doxorubicin and cyclophosphamide. She was then placed on Anastrozole in November, 2005 and took that for 3.53yrs, but was switched to Tamoxifen in April, 2009 because of her arthralgias related to Anastrozole. She completed 5 years of adjuvant hormonal therapy.    History of Stage IA (T1b N0 M0) hormone receptor positive left breast cancer This was diagnosed in July, 2015. This was treated with lumpectomy and pathology revealed a 6mm, grade 1, ductal carcinoma with negative sentinel node. Estrogen and  progesterone receptors were positive and her 2 neu negative and Ki 67 was 18%. She received adjuvant radiation to the left breast and was then placed on Anastrozole again in August, 2015. This was stopped in October, 2016 due to severe hot flashes, anxiety, and depression, mild cognitive difficulty, trouble finding words, and remembering things as well as becoming easily overwhelmed. She did not  tolerate venlafaxine and was placed on paroxetine 20mg  daily in September, 2015 and this improved her symptoms. She was later changed to Lexapro. She was placed on Tamoxifen 20 mg daily for adjuvant hormonal therapy for 5 years.   Plan: She developed superior vena cava syndrome and had some palliative radiation. She has received 2 cycles of chemotherapy and had many complications after the first one but did better the second time. Patient states that she feels poorly and complains of stabbing chest pain worse with inspiration, shortness of breath, and  leg swelling R>L. She is on continuous oxygen  support via nasal cannula. She was directed to [facility] ED on 02/23/2024 for left upper chest pain, sharp and worse with inspiration, radiating to her left shoulder described as a dull ache, shortness of breath, and left leg swelling. Venous ultrasound of the leg was negative for DVT. Her blood culture on 02/23/2024 was negative and her urinalysis was normal. She had a CTA on 02/23/2024 which revealed no mediastinal lymph nodes, no evidence of pulmonary thromboembolism, minimal pericardial effusion measuring up to 5 mm, minimal to mild left pleural effusion with subpleural atelectasis in the posterior basal segments of both lower lung lobes, visualized centrilobular emphysema predominantly in bilateral upper lung lobes, subpleural fibroatelectatic changes involving bilateral upper lung lobes, right middle love, lingular lobe, and bilateral lower lung lobes, mild left ventricular wall hypertrophy, atherosclerotic wall calcifications in the aorta and coronary arteries without evidence of aneurysm or dissection, and elevation of the right dome of the diaphragm with Chilaiditi's sign showing colonic interposition between the right diaphragm and liver surface. She affirms low urine output and worsening edema, but denies dysuria. Her labs were performed at [facility] on 02/23/2024 and she had an elevated WBC of  20.3 with an ANC of 16,040, a low hemoglobin of 9.6, and a low platelet count of 59,000. Her CMP revealed a low sodium of 133 and an elevated blood glucose of 201. Since the CTA does not show her lung cancer, pulmonary emboli, or pneumonia, I think her pain is pleurisy and reassured her that it should pass. She is currently taking 40 mg furosemide daily. Dr. Cornelius instructed her to discontinue this and begin 20 mg torsemide  BID then decrease to once daily if her swelling is under control. I also prescribed 10-300 mg hydrocodone -acetaminophen  every 4 hours PRN to control her pain since the oxycodone 10 mg seems too strong, but 5 mg was not strong enough. She is scheduled for day 1, cycle 3 of carboplatin  + etoposide  + atezolizumab  on 02/28/2024. We will proceed with treatment today. She continues to have lower extremity swelling, but has only been on torsemide  since Saturday. She notes increased urine output. We will continue to monitor this week while she is here for treatment.  I discussed the assessment and treatment plan with the patient.  The patient was provided an opportunity to ask questions and all were answered.  The patient agreed with the plan and demonstrated an understanding of the instructions.  The patient was advised to call back if the symptoms worsen or if the condition fails to  improve as anticipated.  I provided 20 minutes of face-to-face time during this this encounter and > 50% was spent counseling as documented under my assessment and plan.   [name], NP [facility] OF [facility] [address] [place] [address] Dept: [phone] Dept Fax: [phone]   No orders of the defined types were placed in this encounter.  CHIEF COMPLAINT:  CC: Small Cell Lung Cancer  Current Treatment:  Chemotherapy and Immunotherapy   HISTORY OF PRESENT ILLNESS:  Heather Chavez is a 78 y.o. female with a  history of bilateral breast cancer who is referred in consultation by Dr. [name] for assessment and management of newly diagnosed small cell lung cancer. She has a history of stage 1A right breast cancer 06/2003 (s/p lumpectomy, doxorubicin + cyclophosphamide, and 5 years of adjuvant hormonal therapy with tamoxifen/anastrozole), stage 1A left breast cancer (s/p lumpectomy, adjuvant radiation, and 5 years of adjuvant hormonal therapy with tamoxifen/anastrozole), COPD/asthma overlap, and tobacco use.  She underwent testing for hereditary breast and ovarian cancer with the [employer] OvaNext 25 gene panel, which did not reveal any clinically significant mutation. She did have a variant of uncertain significance of MSH2. She had a previous hysterectomy and unilateral oophorectomy for benign disease at a fairly young age, so does not require pap smears. Screening colonoscopy in February, 2017 revealed multiple polyps, all of which were benign polypoid colonic mucosa.  Heather Chavez was admitted into the [facility] ED on October 20th, 2025 and presented with shortness of breath, fevers, and chills. She received a chest x-ray for further evaluation which revealed an right upper lobe infiltrate versus mass and chest CT angio revealed emphysema with chronic interstitial changes in both lung fields corresponding with multifocal pneumonitis and a suspicious pleural based mass in the right upper lobe measuring 3.9cm, and multiple suspicious enlarged mediastinal lymph nodes.  She was diagnosed with mass of the right lung, acute hypoxic respiratory failure, and bilateral community acquired pneumonia before being transferred to [facility]. She then had bronchoscopy and biopsy and pathology report was positive for small cell carcinoma with positive tumor cells for TTF-1 and neuroendocrine markers CD56, synaptophysin, and chromogranin. GATA-3 is negative and Pan keratin showed dot-like positive staining. She comes  to me for discussion of staging and treatment and had a PET scan and brain MRI today but results are pending. I recommend systemic chemotherapy with Carboplatin  and Etopiside. She will also have immunotherapy, either concurrent or sequential depending on her staging. She will have her chemoeducation tomorrow and port placed this Friday.   I have reviewed her chart and materials related to her cancer extensively and collaborated history with the patient. Summary of oncologic history is as follows: Oncology History  Lung cancer, lower lobe (HCC)  01/05/2024 Initial Diagnosis   Lung cancer, lower lobe (HCC)   01/06/2024 Cancer Staging   Staging form: Lung, AJCC V9 - Clinical stage from 01/06/2024: Stage IVB (cT4, cN2b, cM1c1) - Signed by Cornelius Wanda VEAR, MD on 01/06/2024 Histopathologic type: Small cell carcinoma, NOS Stage prefix: Initial diagnosis Method of lymph node assessment: Clinical IASLC grade: G3 Histologic grading system: 3 grade system Laterality: Right Tumor size (mm): 56 Sites of metastasis: Liver Lymph-vascular invasion (LVI): Presence of LVI unknown/indeterminate Diagnostic confirmation: Positive histology Specimen type: Bronchial Biopsy Staged by: Managing physician Standard uptake value (SUV): 12.5 Perineural invasion (PNI): Unknown Pleural/elastic layer invasion: Unknown Stage used in treatment  planning: Yes National guidelines used in treatment planning: Yes Type of national guideline used in treatment planning: NCCN   01/10/2024 -  Chemotherapy   Patient is on Treatment Plan : LUNG SCLC Carboplatin  + Etoposide  + Atezolizumab  Induction q21d x 4 cycles / Atezolizumab  Maintenance q21d      INTERVAL HISTORY:  Heather Chavez is seen in the clinic for follow up of her extensive-stage small cell lung carcinoma, diagnosed in October 2025. She developed superior vena cava syndrome and had some palliative radiation. She has received 2 cycles of chemotherapy and had many  complications after the first one but did better the second time. Patient states that she feels poorly and complains of stabbing chest pain worse with inspiration, shortness of breath, and  leg swelling R>L. She is on continuous oxygen  support via nasal cannula. She was directed to [facility] ED on 02/23/2024 for left upper chest pain, sharp and worse with inspiration, radiating to her left shoulder described as a dull ache, shortness of breath, and left leg swelling. Venous ultrasound of the leg was negative for DVT. Her blood culture on 02/23/2024 was negative and her urinalysis was normal. She had a CTA on 02/23/2024 which revealed no mediastinal lymph nodes, no evidence of pulmonary thromboembolism, minimal pericardial effusion measuring up to 5 mm, minimal to mild left pleural effusion with subpleural atelectasis in the posterior basal segments of both lower lung lobes, visualized centrilobular emphysema predominantly in bilateral upper lung lobes, subpleural fibroatelectatic changes involving bilateral upper lung lobes, right middle love, lingular lobe, and bilateral lower lung lobes, mild left ventricular wall hypertrophy, atherosclerotic wall calcifications in the aorta and coronary arteries without evidence of aneurysm or dissection, and elevation of the right dome of the diaphragm with Chilaiditi's sign showing colonic interposition between the right diaphragm and liver surface. She affirms low urine output and worsening edema, but denies dysuria. Her labs were performed at [facility] on 02/23/2024 and she had an elevated WBC of 20.3 with an ANC of 16,040, a low hemoglobin of 9.6, and a low platelet count of 59,000. Her CMP revealed a low sodium of 133 and an elevated blood glucose of 201. Since the CTA does not show her lung cancer, pulmonary emboli, or pneumonia, I think her pain is pleurisy and reassured her that it should pass. She is currently taking 40 mg furosemide daily. I instructed her  to discontinue this and begin 20 mg torsemide  BID then decrease to once daily if her swelling is under control. I also prescribed 10-300 mg hydrocodone -acetaminophen  every 4 hours PRN to control her pain since the oxycodone 10 mg seems too strong, but 5 mg was not strong enough. She is scheduled for day 1, cycle 3 of carboplatin  + etoposide  + atezolizumab  on 02/28/2024. I will see her back one week later with CBC, CMP, and magnesium .  She denies fever, chills, night sweats, or other signs of infection. She denies cardiorespiratory and gastrointestinal issues. She  denies pain. Her appetite is not good and Her weight has increased 5 pounds over last 7 daysand she is taking diuretics. She is accompanied by her daughter.   HISTORY:   Past Medical History:  Diagnosis Date   Advanced nonexudative age-related macular degeneration of right eye with subfoveal involvement 09/21/2019   Advanced nonexudative age-related macular degeneration of right eye without subfoveal involvement 09/21/2019   Allergic rhinitis    Angina pectoris 05/17/2020   Bilateral breast cancer (HCC) 11/23/2013   BMI 34.0-34.9,adult 09/25/2019   Breast cancer (  HCC)    Bronchitis, chronic (HCC)    CAD (coronary artery disease)    Cancer (HCC)    Cardiac murmur 05/17/2020   Coronary artery calcification 05/17/2020   Degenerative retinal drusen of right eye 09/21/2019   Diabetes mellitus due to underlying condition with unspecified complications (HCC) 05/17/2020   Diverticulosis    Dry eyes 06/25/2016   Dyspnea    Emphysema/COPD    Essential hypertension 05/17/2020   Estrogen receptor positive neoplasm 09/24/2015   Family history of malignant neoplasm of breast 11/23/2013   Family history of peritoneal cancer 11/23/2013   Fuchs' corneal dystrophy 11/03/2016   GERD (gastroesophageal reflux disease)    History of colon polyps    History of right breast cancer 09/19/2018   Hypertension    IBS (irritable bowel syndrome)     with D   Intermediate stage nonexudative age-related macular degeneration of left eye 09/21/2019   Iritis 11/03/2016   Macular degeneration    Major depressive disorder    Malignant neoplasm of upper-inner quadrant of left breast in female, estrogen receptor positive (HCC) 09/01/2016   Mixed dyslipidemia 05/17/2020   Obesity    Obesity (BMI 35.0-39.9 without comorbidity) 05/17/2020   OSA on CPAP 10/29/2020   Diagnosed some 3 months prior after pneumonia episode, Dr. Shellia pulmonology   Peripheral vascular disease    Personal history of chemotherapy    Personal history of radiation therapy    Pseudophakia 09/21/2019   Right-sided chest wall pain    SVT (supraventricular tachycardia) 05/23/2020   Varicose veins of left lower extremity with complications 08/11/2016   Past Surgical History:  Procedure Laterality Date   BREAST BIOPSY     BREAST LUMPECTOMY     right brwast 2005/ left 2015   CATARACT EXTRACTION     CHOLECYSTECTOMY     COLONOSCOPY  04/17/2015   Colonic polyps status post polypectomy. Moderate to severe sigmoid diverticulosis   ENDOVENOUS ABLATION SAPHENOUS VEIN W/ LASER Right 02/19/2023   endovenous laser ablation right greater saphenous vein and stab phlebectomy 10-20 incisions right leg by Gaile New MD   TUBAL LIGATION     VAGINAL HYSTERECTOMY     VEIN LIGATION AND STRIPPING     Family History  Problem Relation Age of Onset   Cancer Mother 36       bilateral breast cancer ages 52 then 16. Peritoneal cancer at age 66.   Other Mother        Ms. Kazmierczak's mother was adopted so have no information regarding maternal family  history other than her mother   Breast cancer Mother    Cancer Father 23       renal   Cancer Sister 63       breast   Breast cancer Sister    Breast cancer Daughter    Breast cancer Maternal Aunt    Breast cancer Paternal Aunt    Breast cancer Paternal Grandmother    Cancer Paternal Grandmother        bilateral breast cancer at unknown  age   Social History:  reports that she quit smoking about 43 years ago. Her smoking use included cigarettes. She started smoking about 58 years ago. She has a 37.5 pack-year smoking history. She has never used smokeless tobacco. She reports current alcohol use. She reports that she does not use drugs.The patient is accompanied by her daughter and granddaughter today.  Allergies:  Allergies  Allergen Reactions   Codeine Nausea And Vomiting and Other (See  Comments)    Other reaction(s): Other (see comments), Vomiting   Levofloxacin Other (See Comments)    Leg cramps   Nexlizet  [Bempedoic Acid-Ezetimibe ] Other (See Comments)   Pitavastatin  Other (See Comments)    Leg cramps  Other Reaction(s): Other (See Comments)  Leg cramps    Leg cramps   Rosuvastatin  Other (See Comments)    Muscle pain    Current Medications: Current Outpatient Medications  Medication Sig Dispense Refill   albuterol  (VENTOLIN  HFA) 108 (90 Base) MCG/ACT inhaler Inhale 2 puffs into the lungs every 4 (four) hours as needed for wheezing.     allopurinol  (ZYLOPRIM ) 300 MG tablet Take 1 tablet (300 mg total) by mouth daily. 30 tablet 0   Biotin 89999 MCG TABS Take 10,000 mcg by mouth daily.     budesonide -formoterol  (SYMBICORT ) 80-4.5 MCG/ACT inhaler Inhale 2 puffs into the lungs 2 (two) times daily.     chlorpheniramine-HYDROcodone  (TUSSIONEX) 10-8 MG/5ML Take 5 mLs by mouth every 12 (twelve) hours as needed for cough. 473 mL 0   diltiazem  (CARDIZEM  CD) 120 MG 24 hr capsule Take 120 mg by mouth daily.     fexofenadine (ALLEGRA) 180 MG tablet Take 180 mg by mouth.     fluocinonide cream (LIDEX) 0.05 % Apply 1 Application topically 3 (three) times daily. (Patient not taking: Reported on 02/16/2024)     FLUoxetine (PROZAC) 10 MG capsule Take 10 mg by mouth daily.     gabapentin (NEURONTIN) 600 MG tablet Take 600 mg by mouth 2 (two) times daily.     GARLIC PO Take 1 tablet by mouth daily.     HYDROcodone -Acetaminophen   10-300 MG TABS Take 1 tablet by mouth every 4 (four) hours as needed. 100 tablet 0   ipratropium-albuterol  (DUONEB) 0.5-2.5 (3) MG/3ML SOLN Take 3 mLs by nebulization every 4 (four) hours as needed (breathing treatment).     LORazepam  (ATIVAN ) 0.5 MG tablet Take by mouth.     magic mouthwash w/lidocaine  SOLN Take 5 mLs by mouth 4 (four) times daily. Suspension contains equal amounts of Maalox Extra Strength, nystatin , diphenhydramine  and lidocaine . (Patient not taking: Reported on 02/16/2024)     magnesium  oxide (MAG-OX) 400 (240 Mg) MG tablet Take 1 tablet (400 mg total) by mouth daily. 30 tablet 5   metFORMIN (GLUCOPHAGE) 500 MG tablet Take 500 mg by mouth daily.     metoprolol  tartrate (LOPRESSOR ) 100 MG tablet Take 150 mg by mouth 2 (two) times daily.     montelukast (SINGULAIR) 10 MG tablet Take 10 mg by mouth at bedtime.     Multiple Vitamins-Minerals (PRESERVISION AREDS 2 PO) Take by mouth.     nitrofurantoin (MACRODANTIN) 100 MG capsule Take 100 mg by mouth 2 (two) times daily.     nitroGLYCERIN  (NITROSTAT ) 0.4 MG SL tablet Place 0.4 mg under the tongue every 5 (five) minutes as needed for chest pain.     nystatin  (MYCOSTATIN /NYSTOP ) powder Apply 1 Application topically 3 (three) times daily. (Patient not taking: Reported on 02/16/2024) 15 g 0   ondansetron  (ZOFRAN ) 4 MG tablet Take 1 tablet (4 mg total) by mouth every 4 (four) hours as needed for nausea. 90 tablet 3   ondansetron  (ZOFRAN -ODT) 8 MG disintegrating tablet Take by mouth.     OXYGEN  Inhale 2.5 L/min into the lungs continuous.     pantoprazole  (PROTONIX ) 40 MG tablet Take 1 tablet (40 mg total) by mouth daily. Call [phone] to schedule an office visit for more refills 90 tablet 0  potassium chloride  SA (KLOR-CON  M) 20 MEQ tablet Take 1 tablet (20 mEq total) by mouth daily. 30 tablet 5   prochlorperazine  (COMPAZINE ) 10 MG tablet Take 1 tablet (10 mg total) by mouth every 6 (six) hours as needed for nausea or vomiting. 90  tablet 3   torsemide  (DEMADEX ) 20 MG tablet Take 2 tablets (40 mg total) by mouth daily. 60 tablet 5   vitamin E 180 MG (400 UNITS) capsule Take 400 Units by mouth daily.     zinc gluconate 50 MG tablet Take 50 mg by mouth daily.     No current facility-administered medications for this visit.    REVIEW OF SYSTEMS:  Review of Systems  Constitutional: Negative.  Negative for appetite change, chills, diaphoresis, fatigue, fever and unexpected weight change.  HENT:   Positive for nosebleeds. Negative for hearing loss, lump/mass, mouth sores, sore throat, tinnitus, trouble swallowing and voice change.   Eyes:  Positive for eye problems (vision changes).  Respiratory:  Positive for cough and shortness of breath. Negative for chest tightness, hemoptysis and wheezing.   Cardiovascular:  Positive for chest pain (pleuritic type) and leg swelling (bilateral). Negative for palpitations.  Gastrointestinal:  Negative for abdominal distention, abdominal pain, blood in stool, constipation, diarrhea, nausea, rectal pain and vomiting.       Bowel incontinence upon straining  Endocrine: Negative.   Genitourinary:  Positive for bladder incontinence (upon straining). Negative for difficulty urinating, dyspareunia, dysuria, frequency, hematuria, menstrual problem, nocturia, pelvic pain, vaginal bleeding and vaginal discharge.   Musculoskeletal: Negative.  Negative for arthralgias, back pain, flank pain, gait problem, myalgias, neck pain and neck stiffness.  Skin: Negative.  Negative for itching, rash and wound.  Neurological:  Positive for headaches and numbness (neuropathy of bilateral feet). Negative for dizziness, extremity weakness, gait problem, light-headedness, seizures and speech difficulty.       Tremor  Hematological:  Negative for adenopathy. Bruises/bleeds easily.  Psychiatric/Behavioral: Negative.  Negative for depression and sleep disturbance. The patient is not nervous/anxious.    VITALS:  There  were no vitals taken for this visit.  Wt Readings from Last 3 Encounters:  02/24/24 211 lb (95.7 kg)  02/16/24 206 lb 8 oz (93.7 kg)  02/10/24 209 lb 0.6 oz (94.8 kg)    There is no height or weight on file to calculate BMI.  Performance status (ECOG): 1 - Symptomatic but completely ambulatory  PHYSICAL EXAM:  Physical Exam Vitals and nursing note reviewed. Exam conducted with a chaperone present.  Constitutional:      General: She is not in acute distress.    Appearance: Normal appearance. She is normal weight. She is not ill-appearing, toxic-appearing or diaphoretic.  HENT:     Head: Normocephalic and atraumatic.     Right Ear: Tympanic membrane, ear canal and external ear normal. There is no impacted cerumen.     Left Ear: Tympanic membrane, ear canal and external ear normal. There is no impacted cerumen.     Nose: Nose normal. No congestion or rhinorrhea.     Mouth/Throat:     Mouth: Mucous membranes are moist.     Pharynx: Oropharynx is clear. No oropharyngeal exudate or posterior oropharyngeal erythema.  Eyes:     General: No scleral icterus.       Right eye: No discharge.        Left eye: No discharge.     Extraocular Movements: Extraocular movements intact.     Conjunctiva/sclera: Conjunctivae normal.     Pupils:  Pupils are equal, round, and reactive to light.  Cardiovascular:     Rate and Rhythm: Normal rate and regular rhythm.     Pulses: Normal pulses.     Heart sounds: Normal heart sounds. No murmur heard.    No friction rub. No gallop.  Pulmonary:     Effort: Pulmonary effort is normal. No respiratory distress.     Breath sounds: Examination of the right-lower field reveals decreased breath sounds. Examination of the left-lower field reveals decreased breath sounds. Decreased breath sounds present.  Abdominal:     General: Bowel sounds are normal. There is no distension.     Palpations: Abdomen is soft. There is no hepatomegaly, splenomegaly or mass.      Tenderness: There is no abdominal tenderness. There is no right CVA tenderness, left CVA tenderness, guarding or rebound.     Hernia: No hernia is present.  Musculoskeletal:        General: Normal range of motion.     Cervical back: Normal range of motion and neck supple.     Right lower leg: Edema (1-2+) present.     Left lower leg: Edema (1-2+) present.     Comments: Bilateral lower leg 1-2+ edema  Lymphadenopathy:     Cervical: No cervical adenopathy.     Right cervical: No superficial, deep or posterior cervical adenopathy.    Left cervical: No superficial, deep or posterior cervical adenopathy.     Upper Body:     Right upper body: No supraclavicular, axillary or pectoral adenopathy.     Left upper body: No supraclavicular, axillary or pectoral adenopathy.  Skin:    General: Skin is warm and dry.  Neurological:     General: No focal deficit present.     Mental Status: She is alert and oriented to person, place, and time. Mental status is at baseline.  Psychiatric:        Mood and Affect: Mood normal.        Behavior: Behavior normal.        Thought Content: Thought content normal.        Judgment: Judgment normal.    LABS:      Latest Ref Rng & Units 02/16/2024   12:27 PM 02/09/2024   10:06 AM 01/31/2024   12:49 PM  CBC  WBC 4.0 - 10.5 K/uL 9.5  7.4  8.4   Hemoglobin 12.0 - 15.0 g/dL 89.8  89.5  9.9   Hematocrit 36.0 - 46.0 % 31.7  32.7  30.9   Platelets 150 - 400 K/uL 187  473  260       Latest Ref Rng & Units 02/16/2024   12:27 PM 02/09/2024   10:06 AM 01/31/2024   12:49 PM  CMP  Glucose 70 - 99 mg/dL 897  873  893   BUN 8 - 23 mg/dL 20  19  14    Creatinine 0.44 - 1.00 mg/dL 9.23  9.05  9.25   Sodium 135 - 145 mmol/L 137  137  134   Potassium 3.5 - 5.1 mmol/L 4.0  4.0  4.1   Chloride 98 - 111 mmol/L 97  97  94   CO2 22 - 32 mmol/L 31  31  30    Calcium  8.9 - 10.3 mg/dL 9.2  9.3  9.4   Total Protein 6.5 - 8.1 g/dL 6.3  6.6  6.7   Total Bilirubin 0.0 - 1.2  mg/dL 0.5  0.3  0.3   Alkaline Phos 38 -  126 U/L 137  131  146   AST 15 - 41 U/L 18  22  30    ALT 0 - 44 U/L 22  24  30       Lab Results  Component Value Date   CEA 11.87 (H) 01/06/2024   /  CEA ([facility])  Date Value Ref Range Status  01/06/2024 11.87 (H) 0.00 - 5.00 ng/mL Final    Comment:    (NOTE) This test was performed using Beckman Coulter's paramagnetic chemiluminescent immunoassay. Values obtained from different assay methods cannot be used interchangeably. Please note that up to 8% of patients who smoke may see values 5.1-10.0 ng/ml and 1% of patients who smoke may see CEA levels >10.0 ng/ml. Performed at [facility], [address] [place], [place] [address]    No results found for: PSA1 No results found for: CAN199 No results found for: CAN125  No results found for: TOTALPROTELP, ALBUMINELP, A1GS, A2GS, BETS, BETA2SER, GAMS, MSPIKE, SPEI Lab Results  Component Value Date   TIBC 316 01/10/2024   FERRITIN 297 01/10/2024   IRONPCTSAT 27 01/10/2024   No results found for: LDH  STUDIES:  EXAM: 02/23/2024 CT ANGIO CHEST IMPRESSION: (No mediastinal lymph nodes are seen) 1. No evidence of pulmonary thromboembolism. 2. Minimal pericardial effusion measuring up to 5 mm. 3. Minimal to mild left pleural effusion with subpleural atelectasis in the posterior basal segments of both lower lung lobes. 4. Visualized centrilobular emphysema predominantly in bilateral upper lung lobes. 5. Subpleural fibroatelectatic changes involving bilateral upper lung lobes, right middle love, lingular lobe, and bilateral lower lung lobes. 6. Mild left ventricular wall hypertrophy. 7. Atherosclerotic wall calcifications in the aorta and coronary arteries without evidence of aneurysm or dissection. 8. 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    EXAM: 01/18/2024 CT ANGIO CHEST IMPRESSION: Negative for pulmonary embolus. Extensive right hilar and mediastinal masses with mass effect as well as encompassing right hilar structures is unchanged. Peripheral right upper lobe mass. Extensive hepatic metastatic disease, likely relatively stable.   EXAM: 01/05/2024 MRI HEAD WITH WITHOUT CM IMPRESSION: Relatively age-appropriate atrophy and mall-vessel disease  EXAM: 01/05/2024 PET SKULL BASE TO MID-THIGH IMPRESSION: Irregular solid noncalcified peripheral right upper lobe mass likely primary neoplasm. Extensive mediastinal and right hilar metastatic adenopathy. Extensive hepatic metastatic disease. Probable single metastatic lesion in the left proximal femur. No definitive adrenal metastatic disease.   Exam: 12/29/2023 Pathology Impression: Lung, right upper lobe, needle core biopsy: Small Cell Carcinoma The tumor cells are positive for TTF-1 and neuroendocrine markers CD56, synaptophysin, and chromogranin. GATA-3 is negative, Pan keratin shows dot-like positive staining.    "

## 2024-02-29 ENCOUNTER — Inpatient Hospital Stay

## 2024-02-29 VITALS — BP 145/65 | HR 96 | Temp 98.6°F | Resp 22

## 2024-02-29 DIAGNOSIS — C3430 Malignant neoplasm of lower lobe, unspecified bronchus or lung: Secondary | ICD-10-CM

## 2024-02-29 DIAGNOSIS — Z5111 Encounter for antineoplastic chemotherapy: Secondary | ICD-10-CM | POA: Diagnosis not present

## 2024-02-29 LAB — T4: T4, Total: 5.9 ug/dL (ref 4.5–12.0)

## 2024-02-29 MED ORDER — SODIUM CHLORIDE 0.9 % IV SOLN
100.0000 mg/m2 | Freq: Once | INTRAVENOUS | Status: AC
  Administered 2024-02-29: 200 mg via INTRAVENOUS
  Filled 2024-02-29: qty 10

## 2024-02-29 MED ORDER — SODIUM CHLORIDE 0.9 % IV SOLN: INTRAVENOUS | Status: DC

## 2024-02-29 MED ORDER — DEXAMETHASONE SOD PHOSPHATE PF 10 MG/ML IJ SOLN
10.0000 mg | Freq: Once | INTRAMUSCULAR | Status: AC
  Administered 2024-02-29: 10 mg via INTRAVENOUS

## 2024-02-29 MED ORDER — SODIUM CHLORIDE 0.9% FLUSH: 10.0000 mL | INTRAVENOUS | Status: DC | PRN

## 2024-02-29 MED FILL — Etoposide Inj 1 GM/50ML (20 MG/ML): INTRAVENOUS | Qty: 10 | Status: AC

## 2024-02-29 NOTE — Progress Notes (Signed)
 Patient reports that Heather Chavez and Dr Cornelius have been following her right leg for swelling. Reports that she believes it is getting better- Right leg is edematous, non-pitting, redness noted to lower leg with 2 broken areas that are scabbed over to right calf, no drainage noted.

## 2024-02-29 NOTE — Patient Instructions (Signed)
Etoposide Injection What is this medication? ETOPOSIDE (e toe POE side) treats some types of cancer. It works by slowing down the growth of cancer cells. This medicine may be used for other purposes; ask your health care provider or pharmacist if you have questions. COMMON BRAND NAME(S): Etopophos, Toposar, VePesid What should I tell my care team before I take this medication? They need to know if you have any of these conditions: Infection Kidney disease Liver disease Low blood counts, such as low white cell, platelet, red cell counts An unusual or allergic reaction to etoposide, other medications, foods, dyes, or preservatives If you or your partner are pregnant or trying to get pregnant Breastfeeding How should I use this medication? This medication is injected into a vein. It is given by your care team in a hospital or clinic setting. Talk to your care team about the use of this medication in children. Special care may be needed. Overdosage: If you think you have taken too much of this medicine contact a poison control center or emergency room at once. NOTE: This medicine is only for you. Do not share this medicine with others. What if I miss a dose? Keep appointments for follow-up doses. It is important not to miss your dose. Call your care team if you are unable to keep an appointment. What may interact with this medication? Warfarin This list may not describe all possible interactions. Give your health care provider a list of all the medicines, herbs, non-prescription drugs, or dietary supplements you use. Also tell them if you smoke, drink alcohol, or use illegal drugs. Some items may interact with your medicine. What should I watch for while using this medication? Your condition will be monitored carefully while you are receiving this medication. This medication may make you feel generally unwell. This is not uncommon as chemotherapy can affect healthy cells as well as cancer  cells. Report any side effects. Continue your course of treatment even though you feel ill unless your care team tells you to stop. This medication can cause serious side effects. To reduce the risk, your care team may give you other medications to take before receiving this one. Be sure to follow the directions from your care team. This medication may increase your risk of getting an infection. Call your care team for advice if you get a fever, chills, sore throat, or other symptoms of a cold or flu. Do not treat yourself. Try to avoid being around people who are sick. This medication may increase your risk to bruise or bleed. Call your care team if you notice any unusual bleeding. Talk to your care team about your risk of cancer. You may be more at risk for certain types of cancers if you take this medication. Talk to your care team if you may be pregnant. Serious birth defects can occur if you take this medication during pregnancy and for 6 months after the last dose. You will need a negative pregnancy test before starting this medication. Contraception is recommended while taking this medication and for 6 months after the last dose. Your care team can help you find the option that works for you. If your partner can get pregnant, use a condom during sex while taking this medication and for 4 months after the last dose. Do not breastfeed while taking this medication. This medication may cause infertility. Talk to your care team if you are concerned about your fertility. What side effects may I notice from receiving this medication?  Side effects that you should report to your care team as soon as possible: Allergic reactions--skin rash, itching, hives, swelling of the face, lips, tongue, or throat Infection--fever, chills, cough, sore throat, wounds that don't heal, pain or trouble when passing urine, general feeling of discomfort or being unwell Low red blood cell level--unusual weakness or fatigue,  dizziness, headache, trouble breathing Unusual bruising or bleeding Side effects that usually do not require medical attention (report to your care team if they continue or are bothersome): Diarrhea Fatigue Hair loss Loss of appetite Nausea Vomiting This list may not describe all possible side effects. Call your doctor for medical advice about side effects. You may report side effects to FDA at 1-800-FDA-1088. Where should I keep my medication? This medication is given in a hospital or clinic. It will not be stored at home. NOTE: This sheet is a summary. It may not cover all possible information. If you have questions about this medicine, talk to your doctor, pharmacist, or health care provider.  2024 [employer]/Gold Standard (2021-07-17 00:00:00)

## 2024-03-01 ENCOUNTER — Inpatient Hospital Stay

## 2024-03-01 VITALS — BP 145/76 | HR 100 | Temp 98.6°F | Resp 22

## 2024-03-01 DIAGNOSIS — Z5111 Encounter for antineoplastic chemotherapy: Secondary | ICD-10-CM | POA: Diagnosis not present

## 2024-03-01 DIAGNOSIS — C3430 Malignant neoplasm of lower lobe, unspecified bronchus or lung: Secondary | ICD-10-CM

## 2024-03-01 MED ORDER — PEGFILGRASTIM INF DEV 6 MG/0.6ML ~~LOC~~ SOSY
6.0000 mg | PREFILLED_SYRINGE | Freq: Once | SUBCUTANEOUS | Status: AC
  Administered 2024-03-01: 6 mg via SUBCUTANEOUS

## 2024-03-01 MED ORDER — SODIUM CHLORIDE 0.9% FLUSH: 10.0000 mL | INTRAVENOUS | Status: DC | PRN

## 2024-03-01 MED ORDER — SODIUM CHLORIDE 0.9 % IV SOLN
100.0000 mg/m2 | Freq: Once | INTRAVENOUS | Status: AC
  Administered 2024-03-01: 200 mg via INTRAVENOUS
  Filled 2024-03-01: qty 10

## 2024-03-01 MED ORDER — SODIUM CHLORIDE 0.9 % IV SOLN: INTRAVENOUS | Status: DC

## 2024-03-01 MED ORDER — DEXAMETHASONE SOD PHOSPHATE PF 10 MG/ML IJ SOLN
10.0000 mg | Freq: Once | INTRAMUSCULAR | Status: AC
  Administered 2024-03-01: 10 mg via INTRAVENOUS

## 2024-03-01 NOTE — Patient Instructions (Signed)
 Managing Chemotherapy Side Effects, Adult Chemotherapy is a treatment that uses medicine to kill cancer cells. However, in addition to killing cancer cells, the medicines can also damage healthy cells. The damage to healthy cells can lead to side effects. The exact side effects depend on the specific medicines used. Most of the side effects of chemotherapy go away once treatment is finished. Until then, work closely with your health care providers and take an active role in managing your side effects. What are common side effects of chemotherapy? Increased risk of infection, bruising, or bleeding. Nausea and vomiting. Constipation or diarrhea. Loss of appetite. Hair loss. Mouth or throat sores. Tiredness (fatigue). Tingling, pain, or numbness in the hands and feet. Dry, sensitive, itchy, or sore skin. Sleep disturbances, such as excessive sleepiness. Confusion, anxiety, or mood swings. Memory changes. How to manage the side effects of chemotherapy Medicines Take over-the-counter and prescription medicines only as told by your health care provider. Talk with your health care provider before taking vitamins, herbs, supplements, or over-the-counter medicines. Some of these can interfere with chemotherapy. Activity Get plenty of rest. Get regular exercise by doing activities such as walking, gentle yoga, or tai chi. Return to your normal activities as told by your health care provider. Ask your health care provider what activities are safe for you. Eating and drinking  Talk to a dietitian about what you should eat and drink during cancer treatment. Drink enough fluid to keep your urine pale yellow. If you have side effects that affect eating, these tips may help: Eat smaller meals and snacks often. Drink high-nutrition and high-calorie shakes or supplements. Choose bland and soft foods that are easy to eat. Do not eat foods that are hot, spicy, or hard to swallow. Do not eat raw or  undercooked meat, eggs, or seafood. Always wash fresh fruits and vegetables well before eating them. Skin care If you have sore or itchy skin: Wear soft, comfortable clothing. Apply creams and ointments to your skin as told by your health care provider. If you lose your hair, consider wearing a wig, hat, or scarf to cover your head. You may want to have someone shave your head as you start to lose hair. During outdoor activities, protect your head and skin from the sun by using sunscreen with an SPF of 30 or higher or by wearing protective clothing and a hat. Meet with a hair and skin care specialist for makeup and skin care tips. Apply sunscreen to your scalp as told by your health care provider. General tips Learn as much as you can about your condition. If you are struggling emotionally, talk with a mental health care provider or join a support group. Keep all follow-up visits. This is important. How to prevent infection and bleeding Chemotherapy may lower your blood counts and put you at risk for infection and bleeding. Here are some ways to help prevent problems. Vaccines Talk to your health care provider about vaccines. You should not get any live vaccines, such as the polio, MMR, chickenpox, and shingles vaccines until your health care provider says that it is safe to do so. Do not be around people who have had live vaccines for as long as your health care provider recommends. Make sure you get a yearly flu shot. People who will be near you should also get a yearly flu shot. Social activity Stay away from crowded places where you could be exposed to germs. Do not be around people who may be sick or  people who have symptoms of a fever until they have been fever-free for at least 24 hours. Do not share food, cups, straws, or utensils with other people. Wear a mask when outside the home if your blood counts are low. Cleanliness  Wash your hands often for at least 20 seconds. Also make  sure that other members of your household wash their hands often. Brush your teeth twice daily using a soft toothbrush. Use mouth rinse only as told by your health care provider. Take a bath or shower daily unless your health care provider gives different instructions. General tips Take your temperature regularly, especially if you have chills or feel warm. Check with your health care provider: Before you travel. Before you have a dental procedure. Before you use a swimming pool, hot tub, or swim in a lake or ocean. If you get chemotherapy through an IV or port, check the site every day for signs of infection. Check for redness, swelling, pain, fluid, and warmth. Avoid activities that put you at risk for injury or bleeding. Use an electric razor to shave instead of a blade. Questions to ask your health care provider What are the most common side effects of my treatment? How will they affect my daily life? What can I do to manage them? What are some possible long-term side effects? What are possible complications? What support services are available? What number can I call with questions or concerns? Where to find support Cancer affects the entire family. Find out what family support resources are available from your cancer treatment center. For more support, turn to: Your cancer care team. Friends and family. Your religious community. Other people with cancer. Community-based or online support groups. Where to find more information National Cancer Institute: www.cancer.gov American Cancer Society: www.cancer.org Contact a health care provider if: You bleed or bruise more often. You notice blood in your urine or stool. You have any of these symptoms: A skin rash, or dry or itchy skin. A headache or stiff neck. Cold or flu symptoms. A cough. Persistent nausea or vomiting. Persistent diarrhea. Frequent urination, burning when passing urine, or foul-smelling urine. You cannot eat  because of mouth or throat pain. You are sad, confused, anxious, or depressed. Get help right away if: You have any of these symptoms: A fever or chills. Your health care provider should know about this right away. Redness, swelling, pain, fluid, or warmth near an IV site. Bleeding that you cannot stop. A seizure. You cannot swallow. You have chest pain. You have trouble breathing. A family member or caregiver should get help right away if you have a sudden or unusual change in behavior. These symptoms may be an emergency. Get help right away. Call 911. Do not wait to see if the symptoms will go away. Do not drive yourself to the hospital. Summary Chemotherapy is a treatment that uses medicine to kill cancer cells and can cause side effects. The specific side effects depend on the specific medicines used. Learn as much as you can about your condition. Ask about side effects to watch for and how to treat them. Seek out support and resources from others. Find out what family support resources are available from your cancer treatment center. Let your health care provider know if you notice any new, unusual, or worsening symptoms, especially fever or chills. This information is not intended to replace advice given to you by your health care provider. Make sure you discuss any questions you have with your health  care provider. Document Revised: 02/13/2021 Document Reviewed: 02/13/2021 Elsevier Patient Education  2024 [employer].

## 2024-03-07 ENCOUNTER — Inpatient Hospital Stay

## 2024-03-07 ENCOUNTER — Other Ambulatory Visit: Payer: Self-pay

## 2024-03-07 ENCOUNTER — Encounter: Payer: Self-pay | Admitting: Hematology and Oncology

## 2024-03-07 ENCOUNTER — Other Ambulatory Visit: Payer: Self-pay | Admitting: Hematology and Oncology

## 2024-03-07 ENCOUNTER — Inpatient Hospital Stay (HOSPITAL_BASED_OUTPATIENT_CLINIC_OR_DEPARTMENT_OTHER): Admitting: Hematology and Oncology

## 2024-03-07 VITALS — BP 91/52 | HR 77 | Temp 97.8°F | Resp 18 | Ht 65.0 in | Wt 200.8 lb

## 2024-03-07 DIAGNOSIS — Z5111 Encounter for antineoplastic chemotherapy: Secondary | ICD-10-CM | POA: Diagnosis not present

## 2024-03-07 DIAGNOSIS — C3430 Malignant neoplasm of lower lobe, unspecified bronchus or lung: Secondary | ICD-10-CM | POA: Diagnosis not present

## 2024-03-07 DIAGNOSIS — D630 Anemia in neoplastic disease: Secondary | ICD-10-CM

## 2024-03-07 LAB — CMP (CANCER CENTER ONLY)
ALT: 24 U/L (ref 0–44)
AST: 22 U/L (ref 15–41)
Albumin: 3.7 g/dL (ref 3.5–5.0)
Alkaline Phosphatase: 164 U/L — ABNORMAL HIGH (ref 38–126)
Anion gap: 11 (ref 5–15)
BUN: 20 mg/dL (ref 8–23)
CO2: 29 mmol/L (ref 22–32)
Calcium: 9.6 mg/dL (ref 8.9–10.3)
Chloride: 97 mmol/L — ABNORMAL LOW (ref 98–111)
Creatinine: 0.72 mg/dL (ref 0.44–1.00)
GFR, Estimated: >60 mL/min
Glucose, Bld: 169 mg/dL — ABNORMAL HIGH (ref 70–99)
Potassium: 3.4 mmol/L — ABNORMAL LOW (ref 3.5–5.1)
Sodium: 137 mmol/L (ref 135–145)
Total Bilirubin: 0.5 mg/dL (ref 0.0–1.2)
Total Protein: 6.4 g/dL — ABNORMAL LOW (ref 6.5–8.1)

## 2024-03-07 LAB — CBC WITH DIFFERENTIAL (CANCER CENTER ONLY)
Abs Immature Granulocytes: 0.04 K/uL (ref 0.00–0.07)
Basophils Absolute: 0 K/uL (ref 0.0–0.1)
Basophils Relative: 1 %
Eosinophils Absolute: 0 K/uL (ref 0.0–0.5)
Eosinophils Relative: 0 %
HCT: 23.1 % — ABNORMAL LOW (ref 36.0–46.0)
Hemoglobin: 7.4 g/dL — ABNORMAL LOW (ref 12.0–15.0)
Immature Granulocytes: 1 %
Lymphocytes Relative: 14 %
Lymphs Abs: 0.6 K/uL — ABNORMAL LOW (ref 0.7–4.0)
MCH: 29.7 pg (ref 26.0–34.0)
MCHC: 32 g/dL (ref 30.0–36.0)
MCV: 92.8 fL (ref 80.0–100.0)
Monocytes Absolute: 0.4 K/uL (ref 0.1–1.0)
Monocytes Relative: 10 %
Neutro Abs: 2.9 K/uL (ref 1.7–7.7)
Neutrophils Relative %: 74 %
Platelet Count: 110 K/uL — ABNORMAL LOW (ref 150–400)
RBC: 2.49 MIL/uL — ABNORMAL LOW (ref 3.87–5.11)
RDW: 16.6 % — ABNORMAL HIGH (ref 11.5–15.5)
Smear Review: NORMAL
WBC Count: 3.9 K/uL — ABNORMAL LOW (ref 4.0–10.5)
nRBC: 0 % (ref 0.0–0.2)

## 2024-03-07 LAB — PREPARE RBC (CROSSMATCH)

## 2024-03-07 LAB — MAGNESIUM: Magnesium: 1.5 mg/dL — ABNORMAL LOW (ref 1.7–2.4)

## 2024-03-07 MED ORDER — MAGNESIUM SULFATE 4 GM/100ML IV SOLN
4.0000 g | Freq: Once | INTRAVENOUS | Status: AC
  Administered 2024-03-07: 4 g via INTRAVENOUS
  Filled 2024-03-07: qty 100

## 2024-03-07 MED ORDER — DIPHENHYDRAMINE HCL 25 MG PO TABS
25.0000 mg | ORAL_TABLET | Freq: Once | ORAL | Status: AC
  Administered 2024-03-07: 25 mg via ORAL
  Filled 2024-03-07: qty 1

## 2024-03-07 MED ORDER — SODIUM CHLORIDE 0.9 % IV SOLN: INTRAVENOUS | Status: DC

## 2024-03-07 MED ORDER — SODIUM CHLORIDE 0.9% IV SOLUTION
250.0000 mL | INTRAVENOUS | Status: DC
  Administered 2024-03-07: 250 mL via INTRAVENOUS

## 2024-03-07 MED ORDER — ACETAMINOPHEN 325 MG PO TABS
650.0000 mg | ORAL_TABLET | Freq: Once | ORAL | Status: AC
  Administered 2024-03-07: 650 mg via ORAL
  Filled 2024-03-07: qty 2

## 2024-03-07 MED ORDER — POTASSIUM CHLORIDE 10 MEQ/100ML IV SOLN
10.0000 meq | INTRAVENOUS | Status: AC
  Administered 2024-03-07 (×2): 10 meq via INTRAVENOUS
  Filled 2024-03-07 (×2): qty 100

## 2024-03-07 NOTE — Patient Instructions (Signed)

## 2024-03-07 NOTE — Progress Notes (Signed)
 " [facility]  [address] [place],  [place]  [address] [phone]  Clinic Day: 03/07/2024  Referring physician: [name], MD  ASSESSMENT & PLAN:  Assessment:  Small Cell Lung Cancer This is newly diagnosed but found to be stage IV with liver metastases and significant bulky mediastinal adenopathy, right pleural based mass, and small right pleural effusion. She has been started on chemotherapy with Carboplatin , Etoposide , and Atezolizumab , with her first cycle given 6 weeks ago. She has had a rough time with cytopenias and ended up in the hospital twice but is over the worst. She did better with her second cycle but developed this pleuritic chest pain over the last few days. Dr. Cornelius reassured her that the cancer is responding and we want to continue with her treatment. Dr. Cornelius reviewed all the plans with the patient her daughter and we plan close follow-up and supportive care with weekly labs and visits.   Superior Vena Cava Syndrome Due to this, palliative radiation was added and is helping. She had increased pain in the upper mediastinum earlier this week, but that has improved and she is tapering off the oxycodone. The edema of her arms has resolved and neck and face are greatly decreased after palliative radiation.  Hyponatremia This is likely caused by SIADH from her small cell lung cancer and will be monitored closely. Her sodium is finally coming up now and was normal for the first time 2 weeks ago. Today it is normal at 137.   Hypomagnesemia She has received IV magnesium  on multiple occasions and we will continue to monitor this and supplement as needed. She is on oral 400 mg magnesium  oxide supplement. If her level continues to remain low, we may need to increase this to BID.  Pneumonia This was multifocal in November and at least partially related to post obstruction of the right lower main bronchus. We do not see these infiltrates on her current CT scan,  but she does have some atelectasis bilaterally.  Anemia This is likely related to her new small cell lung cancer and bilateral pneumonia. Her vitamin levels were all normal. Her hemoglobin today is 7.4. We will transfuse 1 unit today.   Neutropenia She had profound neutropenia with her first cycle, with an ANC down to 100 at her nadir but she did not receive Neulasta  due to the radiation. I have reviewed neutropenic precautions with her and her daughter, and her counts did recover. We were able to use Neulasta  with this second cycle. WBC today is 3.9, with ANC 2.9.  Thrombocytopenia Her platelet count is 110 today.   History of Stage IA (T1b N0 M0) hormone receptor positive right breast cancer This was diagnosed in April, 2005. She was treated with a lumpectomy and pathology revealed a 6 mm, grade 3, invasive ductal carcinoma with sentinel node positive with a 3 mm micrometastasis. Estrogen and progesterone receptors were positive and her 2 neu negative. She was treated with 4 cycles of doxorubicin and cyclophosphamide. She was then placed on Anastrozole in November, 2005 and took that for 3.40yrs, but was switched to Tamoxifen in April, 2009 because of her arthralgias related to Anastrozole. She completed 5 years of adjuvant hormonal therapy.    History of Stage IA (T1b N0 M0) hormone receptor positive left breast cancer This was diagnosed in July, 2015. This was treated with lumpectomy and pathology revealed a 6mm, grade 1, ductal carcinoma with negative sentinel node. Estrogen and progesterone receptors were positive  and her 2 neu negative and Ki 67 was 18%. She received adjuvant radiation to the left breast and was then placed on Anastrozole again in August, 2015. This was stopped in October, 2016 due to severe hot flashes, anxiety, and depression, mild cognitive difficulty, trouble finding words, and remembering things as well as becoming easily overwhelmed. She did not tolerate venlafaxine and  was placed on paroxetine 20mg  daily in September, 2015 and this improved her symptoms. She was later changed to Lexapro. She was placed on Tamoxifen 20 mg daily for adjuvant hormonal therapy for 5 years.   Plan: She presents today for post treatment follow up. She is feeling pretty fatigued with hemoglobin 7.4. We will plan for IVF with 20 mEq of Potassium and 3 grams of magnesium . We will also plan for 1 unit of PRBC today. She will return to clinic next week for repeat evaluation.   I provided 20 minutes of face-to-face time during this this encounter and > 50% was spent counseling as documented under my assessment and plan.   Heather DELENA Bach, Heather Chavez [facility] A DEPT OF [facility] [address] [place] [address] Dept: [phone] Dept Fax: [phone]   No orders of the defined types were placed in this encounter.  CHIEF COMPLAINT:  CC: Small Cell Lung Cancer  Current Treatment:  Chemotherapy and Immunotherapy   HISTORY OF PRESENT ILLNESS:  Heather Chavez is a 78 y.o. female with a history of bilateral breast cancer who is referred in consultation by Dr. [name] for assessment and management of newly diagnosed small cell lung cancer. She has a history of stage 1A right breast cancer 06/2003 (s/p lumpectomy, doxorubicin + cyclophosphamide, and 5 years of adjuvant hormonal therapy with tamoxifen/anastrozole), stage 1A left breast cancer (s/p lumpectomy, adjuvant radiation, and 5 years of adjuvant hormonal therapy with tamoxifen/anastrozole), COPD/asthma overlap, and tobacco use.  She underwent testing for hereditary breast and ovarian cancer with the [employer] OvaNext 25 gene panel, which did not reveal any clinically significant mutation. She did have a variant of uncertain significance of MSH2. She had a previous hysterectomy and unilateral oophorectomy for benign disease at a fairly young age, so does not  require pap smears. Screening colonoscopy in February, 2017 revealed multiple polyps, all of which were benign polypoid colonic mucosa.  Cleopatra was admitted into the [facility] ED on October 20th, 2025 and presented with shortness of breath, fevers, and chills. She received a chest x-ray for further evaluation which revealed an right upper lobe infiltrate versus mass and chest CT angio revealed emphysema with chronic interstitial changes in both lung fields corresponding with multifocal pneumonitis and a suspicious pleural based mass in the right upper lobe measuring 3.9cm, and multiple suspicious enlarged mediastinal lymph nodes.  She was diagnosed with mass of the right lung, acute hypoxic respiratory failure, and bilateral community acquired pneumonia before being transferred to [facility]. She then had bronchoscopy and biopsy and pathology report was positive for small cell carcinoma with positive tumor cells for TTF-1 and neuroendocrine markers CD56, synaptophysin, and chromogranin. GATA-3 is negative and Pan keratin showed dot-like positive staining. She comes to me for discussion of staging and treatment and had a PET scan and brain MRI today but results are pending. I recommend systemic chemotherapy with Carboplatin  and Etopiside. She will also have immunotherapy, either concurrent or sequential depending on her staging. She will have her chemoeducation tomorrow and port placed  this Friday.   I have reviewed her chart and materials related to her cancer extensively and collaborated history with the patient. Summary of oncologic history is as follows: Oncology History  Lung cancer, lower lobe (HCC)  01/05/2024 Initial Diagnosis   Lung cancer, lower lobe (HCC)   01/06/2024 Cancer Staging   Staging form: Lung, AJCC V9 - Clinical stage from 01/06/2024: Stage IVB (cT4, cN2b, cM1c1) - Signed by Cornelius Wanda DEL, MD on 01/06/2024 Histopathologic type: Small cell carcinoma, NOS Stage  prefix: Initial diagnosis Method of lymph node assessment: Clinical IASLC grade: G3 Histologic grading system: 3 grade system Laterality: Right Tumor size (mm): 56 Sites of metastasis: Liver Lymph-vascular invasion (LVI): Presence of LVI unknown/indeterminate Diagnostic confirmation: Positive histology Specimen type: Bronchial Biopsy Staged by: Managing physician Standard uptake value (SUV): 12.5 Perineural invasion (PNI): Unknown Pleural/elastic layer invasion: Unknown Stage used in treatment planning: Yes National guidelines used in treatment planning: Yes Type of national guideline used in treatment planning: NCCN   01/10/2024 -  Chemotherapy   Patient is on Treatment Plan : LUNG SCLC Carboplatin  + Etoposide  + Atezolizumab  Induction q21d x 4 cycles / Atezolizumab  Maintenance q21d      INTERVAL HISTORY:  Heather Chavez is seen in the clinic for follow up of her extensive-stage small cell lung carcinoma, diagnosed in October 2025. She developed superior vena cava syndrome and had some palliative radiation. She has received 2 cycles of chemotherapy and had many complications after the first one but did better the second time. Patient states that she feels poorly and complains of stabbing chest pain worse with inspiration, shortness of breath, and  leg swelling R>L. She is on continuous oxygen  support via nasal cannula. She was directed to [facility] ED on 02/23/2024 for left upper chest pain, sharp and worse with inspiration, radiating to her left shoulder described as a dull ache, shortness of breath, and left leg swelling. Venous ultrasound of the leg was negative for DVT. Her blood culture on 02/23/2024 was negative and her urinalysis was normal. She had a CTA on 02/23/2024 which revealed no mediastinal lymph nodes, no evidence of pulmonary thromboembolism, minimal pericardial effusion measuring up to 5 mm, minimal to mild left pleural effusion with subpleural atelectasis in the posterior  basal segments of both lower lung lobes, visualized centrilobular emphysema predominantly in bilateral upper lung lobes, subpleural fibroatelectatic changes involving bilateral upper lung lobes, right middle love, lingular lobe, and bilateral lower lung lobes, mild left ventricular wall hypertrophy, atherosclerotic wall calcifications in the aorta and coronary arteries without evidence of aneurysm or dissection, and elevation of the right dome of the diaphragm with Chilaiditi's sign showing colonic interposition between the right diaphragm and liver surface. She affirms low urine output and worsening edema, but denies dysuria. Her labs were performed at [facility] on 02/23/2024 and she had an elevated WBC of 20.3 with an ANC of 16,040, a low hemoglobin of 9.6, and a low platelet count of 59,000. Her CMP revealed a low sodium of 133 and an elevated blood glucose of 201. Since the CTA does not show her lung cancer, pulmonary emboli, or pneumonia, I think her pain is pleurisy and reassured her that it should pass. She is currently taking 40 mg furosemide daily. I instructed her to discontinue this and begin 20 mg torsemide  BID then decrease to once daily if her swelling is under control. I also prescribed 10-300 mg hydrocodone -acetaminophen  every 4 hours PRN to control her pain since the oxycodone 10 mg seems too  strong, but 5 mg was not strong enough.  I will see her back one week later with CBC, CMP, and magnesium .  She denies fever, chills, night sweats, or other signs of infection. She denies cardiorespiratory and gastrointestinal issues. She  denies pain. Her appetite is not good and Her weight has increased 5 pounds over last 7 daysand she is taking diuretics. She is accompanied by her daughter.   HISTORY:   Past Medical History:  Diagnosis Date   Advanced nonexudative age-related macular degeneration of right eye with subfoveal involvement 09/21/2019   Advanced nonexudative age-related macular  degeneration of right eye without subfoveal involvement 09/21/2019   Allergic rhinitis    Angina pectoris 05/17/2020   Bilateral breast cancer (HCC) 11/23/2013   BMI 34.0-34.9,adult 09/25/2019   Breast cancer (HCC)    Bronchitis, chronic (HCC)    CAD (coronary artery disease)    Cancer (HCC)    Cardiac murmur 05/17/2020   Coronary artery calcification 05/17/2020   Degenerative retinal drusen of right eye 09/21/2019   Diabetes mellitus due to underlying condition with unspecified complications (HCC) 05/17/2020   Diverticulosis    Dry eyes 06/25/2016   Dyspnea    Emphysema/COPD    Essential hypertension 05/17/2020   Estrogen receptor positive neoplasm 09/24/2015   Family history of malignant neoplasm of breast 11/23/2013   Family history of peritoneal cancer 11/23/2013   Fuchs' corneal dystrophy 11/03/2016   GERD (gastroesophageal reflux disease)    History of colon polyps    History of right breast cancer 09/19/2018   Hypertension    IBS (irritable bowel syndrome)    with D   Intermediate stage nonexudative age-related macular degeneration of left eye 09/21/2019   Iritis 11/03/2016   Macular degeneration    Major depressive disorder    Malignant neoplasm of upper-inner quadrant of left breast in female, estrogen receptor positive (HCC) 09/01/2016   Mixed dyslipidemia 05/17/2020   Obesity    Obesity (BMI 35.0-39.9 without comorbidity) 05/17/2020   OSA on CPAP 10/29/2020   Diagnosed some 3 months prior after pneumonia episode, Dr. Shellia pulmonology   Peripheral vascular disease    Personal history of chemotherapy    Personal history of radiation therapy    Pseudophakia 09/21/2019   Right-sided chest wall pain    SVT (supraventricular tachycardia) 05/23/2020   Varicose veins of left lower extremity with complications 08/11/2016   Past Surgical History:  Procedure Laterality Date   BREAST BIOPSY     BREAST LUMPECTOMY     right brwast 2005/ left 2015   CATARACT EXTRACTION      CHOLECYSTECTOMY     COLONOSCOPY  04/17/2015   Colonic polyps status post polypectomy. Moderate to severe sigmoid diverticulosis   ENDOVENOUS ABLATION SAPHENOUS VEIN W/ LASER Right 02/19/2023   endovenous laser ablation right greater saphenous vein and stab phlebectomy 10-20 incisions right leg by Gaile New MD   TUBAL LIGATION     VAGINAL HYSTERECTOMY     VEIN LIGATION AND STRIPPING     Family History  Problem Relation Age of Onset   Cancer Mother 54       bilateral breast cancer ages 82 then 43. Peritoneal cancer at age 57.   Other Mother        Ms. Forsberg's mother was adopted so have no information regarding maternal family  history other than her mother   Breast cancer Mother    Cancer Father 67       renal   Cancer Sister 66  breast   Breast cancer Sister    Breast cancer Daughter    Breast cancer Maternal Aunt    Breast cancer Paternal Aunt    Breast cancer Paternal Grandmother    Cancer Paternal Grandmother        bilateral breast cancer at unknown age   Social History:  reports that she quit smoking about 43 years ago. Her smoking use included cigarettes. She started smoking about 58 years ago. She has a 37.5 pack-year smoking history. She has never used smokeless tobacco. She reports current alcohol use. She reports that she does not use drugs.The patient is accompanied by her daughter and granddaughter today.  Allergies:  Allergies  Allergen Reactions   Codeine Nausea And Vomiting and Other (See Comments)    Other reaction(s): Other (see comments), Vomiting   Levofloxacin Other (See Comments)    Leg cramps   Nexlizet  [Bempedoic Acid-Ezetimibe ] Other (See Comments)   Pitavastatin  Other (See Comments)    Leg cramps  Other Reaction(s): Other (See Comments)  Leg cramps    Leg cramps   Rosuvastatin  Other (See Comments)    Muscle pain    Current Medications: Current Outpatient Medications  Medication Sig Dispense Refill   albuterol  (VENTOLIN  HFA) 108  (90 Base) MCG/ACT inhaler Inhale 2 puffs into the lungs every 4 (four) hours as needed for wheezing.     Biotin 89999 MCG TABS Take 10,000 mcg by mouth daily.     budesonide -formoterol  (SYMBICORT ) 80-4.5 MCG/ACT inhaler Inhale 2 puffs into the lungs 2 (two) times daily.     chlorpheniramine-HYDROcodone  (TUSSIONEX) 10-8 MG/5ML Take 5 mLs by mouth every 12 (twelve) hours as needed for cough. 473 mL 0   gabapentin (NEURONTIN) 600 MG tablet Take 600 mg by mouth 2 (two) times daily.     GARLIC PO Take 1 tablet by mouth daily.     HYDROcodone -Acetaminophen  10-300 MG TABS Take 1 tablet by mouth every 4 (four) hours as needed. 100 tablet 0   ipratropium-albuterol  (DUONEB) 0.5-2.5 (3) MG/3ML SOLN Take 3 mLs by nebulization every 4 (four) hours as needed (breathing treatment).     loratadine (CLARITIN) 10 MG tablet Take 10 mg by mouth daily.     LORazepam  (ATIVAN ) 0.5 MG tablet Take by mouth.     magnesium  oxide (MAG-OX) 400 (240 Mg) MG tablet Take 1 tablet (400 mg total) by mouth daily. 30 tablet 5   metFORMIN (GLUCOPHAGE) 500 MG tablet Take 500 mg by mouth daily.     metoprolol  tartrate (LOPRESSOR ) 100 MG tablet Take 150 mg by mouth 2 (two) times daily.     montelukast (SINGULAIR) 10 MG tablet Take 10 mg by mouth at bedtime.     Multiple Vitamins-Minerals (PRESERVISION AREDS 2 PO) Take by mouth.     nitroGLYCERIN  (NITROSTAT ) 0.4 MG SL tablet Place 0.4 mg under the tongue every 5 (five) minutes as needed for chest pain.     ondansetron  (ZOFRAN ) 4 MG tablet Take 1 tablet (4 mg total) by mouth every 4 (four) hours as needed for nausea. 90 tablet 3   ondansetron  (ZOFRAN -ODT) 8 MG disintegrating tablet Take by mouth.     OXYGEN  Inhale 2.5 L/min into the lungs continuous.     pantoprazole  (PROTONIX ) 40 MG tablet Take 1 tablet (40 mg total) by mouth daily. Call [phone] to schedule an office visit for more refills 90 tablet 0   potassium chloride  SA (KLOR-CON  M) 20 MEQ tablet Take 1 tablet (20 mEq total) by  mouth daily. 30  tablet 5   prochlorperazine  (COMPAZINE ) 10 MG tablet Take 1 tablet (10 mg total) by mouth every 6 (six) hours as needed for nausea or vomiting. 90 tablet 3   sertraline (ZOLOFT) 50 MG tablet Take 50 mg by mouth daily.     torsemide  (DEMADEX ) 20 MG tablet Take 2 tablets (40 mg total) by mouth daily. 60 tablet 5   vitamin E 180 MG (400 UNITS) capsule Take 400 Units by mouth daily.     zinc gluconate 50 MG tablet Take 50 mg by mouth daily.     allopurinol  (ZYLOPRIM ) 300 MG tablet Take 1 tablet (300 mg total) by mouth daily. (Patient not taking: Reported on 03/07/2024) 30 tablet 0   diltiazem  (CARDIZEM  CD) 120 MG 24 hr capsule Take 1 capsule (120 mg total) by mouth daily. 90 capsule 0   fluocinonide cream (LIDEX) 0.05 % Apply 1 Application topically 3 (three) times daily. (Patient not taking: Reported on 03/07/2024)     magic mouthwash w/lidocaine  SOLN Take 5 mLs by mouth 4 (four) times daily. Suspension contains equal amounts of Maalox Extra Strength, nystatin , diphenhydramine  and lidocaine . (Patient not taking: Reported on 03/07/2024)     nystatin  (MYCOSTATIN /NYSTOP ) powder Apply 1 Application topically 3 (three) times daily. (Patient not taking: Reported on 03/07/2024) 15 g 0   No current facility-administered medications for this visit.   Facility-Administered Medications Ordered in Other Visits  Medication Dose Route Frequency Provider Last Rate Last Admin   0.9 %  sodium chloride  infusion (Manually program via Guardrails IV Fluids)  250 mL Intravenous Continuous Harl Setter A, Heather Chavez       0.9 %  sodium chloride  infusion   Intravenous Continuous Harl Setter A, Heather Chavez 500 mL/hr at 03/07/24 1030 New Bag at 03/07/24 1030   0.9 %  sodium chloride  infusion   Intravenous Continuous Harl Setter A, Heather Chavez 10 mL/hr at 03/07/24 1038 New Bag at 03/07/24 1038   acetaminophen  (TYLENOL ) tablet 650 mg  650 mg Oral Once Jalah Warmuth A, Heather Chavez       diphenhydrAMINE  (BENADRYL ) tablet 25 mg  25  mg Oral Once Birdie Beveridge A, Heather Chavez       magnesium  sulfate IVPB 4 g 100 mL  4 g Intravenous Once Rudolf Blizard A, Heather Chavez 50 mL/hr at 03/07/24 1037 4 g at 03/07/24 1037   potassium chloride  10 mEq in 100 mL IVPB  10 mEq Intravenous Q1 Hr x 2 Kema Santaella A, Heather Chavez 100 mL/hr at 03/07/24 1147 10 mEq at 03/07/24 1147    REVIEW OF SYSTEMS:  Review of Systems  Constitutional: Negative.  Negative for appetite change, chills, diaphoresis, fatigue, fever and unexpected weight change.  HENT:   Positive for nosebleeds. Negative for hearing loss, lump/mass, mouth sores, sore throat, tinnitus, trouble swallowing and voice change.   Eyes:  Positive for eye problems (vision changes).  Respiratory:  Positive for cough and shortness of breath. Negative for chest tightness, hemoptysis and wheezing.   Cardiovascular:  Positive for chest pain (pleuritic type) and leg swelling (bilateral). Negative for palpitations.  Gastrointestinal:  Negative for abdominal distention, abdominal pain, blood in stool, constipation, diarrhea, nausea, rectal pain and vomiting.       Bowel incontinence upon straining  Endocrine: Negative.   Genitourinary:  Positive for bladder incontinence (upon straining). Negative for difficulty urinating, dyspareunia, dysuria, frequency, hematuria, menstrual problem, nocturia, pelvic pain, vaginal bleeding and vaginal discharge.   Musculoskeletal: Negative.  Negative for arthralgias, back pain, flank pain, gait problem, myalgias, neck pain and  neck stiffness.  Skin: Negative.  Negative for itching, rash and wound.  Neurological:  Positive for headaches and numbness (neuropathy of bilateral feet). Negative for dizziness, extremity weakness, gait problem, light-headedness, seizures and speech difficulty.       Tremor  Hematological:  Negative for adenopathy. Bruises/bleeds easily.  Psychiatric/Behavioral: Negative.  Negative for depression and sleep disturbance. The patient is not nervous/anxious.     VITALS:  Blood pressure (!) 91/52, pulse 77, temperature 97.8 F (36.6 C), temperature source Oral, resp. rate 18, height 5' 5 (1.651 m), weight 200 lb 12.8 oz (91.1 kg), SpO2 97%.  Wt Readings from Last 3 Encounters:  03/07/24 200 lb 12.8 oz (91.1 kg)  02/28/24 209 lb 3.2 oz (94.9 kg)  02/24/24 211 lb (95.7 kg)    Body mass index is 33.41 kg/m.  Performance status (ECOG): 1 - Symptomatic but completely ambulatory  PHYSICAL EXAM:  Physical Exam Vitals and nursing note reviewed. Exam conducted with a chaperone present.  Constitutional:      General: She is not in acute distress.    Appearance: Normal appearance. She is normal weight. She is not ill-appearing, toxic-appearing or diaphoretic.  HENT:     Head: Normocephalic and atraumatic.     Right Ear: Tympanic membrane, ear canal and external ear normal. There is no impacted cerumen.     Left Ear: Tympanic membrane, ear canal and external ear normal. There is no impacted cerumen.     Nose: Nose normal. No congestion or rhinorrhea.     Mouth/Throat:     Mouth: Mucous membranes are moist.     Pharynx: Oropharynx is clear. No oropharyngeal exudate or posterior oropharyngeal erythema.  Eyes:     General: No scleral icterus.       Right eye: No discharge.        Left eye: No discharge.     Extraocular Movements: Extraocular movements intact.     Conjunctiva/sclera: Conjunctivae normal.     Pupils: Pupils are equal, round, and reactive to light.  Cardiovascular:     Rate and Rhythm: Normal rate and regular rhythm.     Pulses: Normal pulses.     Heart sounds: Normal heart sounds. No murmur heard.    No friction rub. No gallop.  Pulmonary:     Effort: Pulmonary effort is normal. No respiratory distress.     Breath sounds: Examination of the right-lower field reveals decreased breath sounds. Examination of the left-lower field reveals decreased breath sounds. Decreased breath sounds present.  Abdominal:     General: Bowel sounds  are normal. There is no distension.     Palpations: Abdomen is soft. There is no hepatomegaly, splenomegaly or mass.     Tenderness: There is no abdominal tenderness. There is no right CVA tenderness, left CVA tenderness, guarding or rebound.     Hernia: No hernia is present.  Musculoskeletal:        General: Normal range of motion.     Cervical back: Normal range of motion and neck supple.     Right lower leg: Edema (1-2+) present.     Left lower leg: Edema (1-2+) present.     Comments: Bilateral lower leg 1-2+ edema  Lymphadenopathy:     Cervical: No cervical adenopathy.     Right cervical: No superficial, deep or posterior cervical adenopathy.    Left cervical: No superficial, deep or posterior cervical adenopathy.     Upper Body:     Right upper body: No supraclavicular, axillary  or pectoral adenopathy.     Left upper body: No supraclavicular, axillary or pectoral adenopathy.  Skin:    General: Skin is warm and dry.  Neurological:     General: No focal deficit present.     Mental Status: She is alert and oriented to person, place, and time. Mental status is at baseline.  Psychiatric:        Mood and Affect: Mood normal.        Behavior: Behavior normal.        Thought Content: Thought content normal.        Judgment: Judgment normal.    LABS:      Latest Ref Rng & Units 03/07/2024    9:26 AM 02/28/2024    9:00 AM 02/16/2024   12:27 PM  CBC  WBC 4.0 - 10.5 K/uL 3.9  12.3  9.5   Hemoglobin 12.0 - 15.0 g/dL 7.4  8.8  89.8   Hematocrit 36.0 - 46.0 % 23.1  28.0  31.7   Platelets 150 - 400 K/uL 110  151  187       Latest Ref Rng & Units 03/07/2024    9:26 AM 02/28/2024    9:00 AM 02/16/2024   12:27 PM  CMP  Glucose 70 - 99 mg/dL 830  781  897   BUN 8 - 23 mg/dL 20  17  20    Creatinine 0.44 - 1.00 mg/dL 9.27  9.15  9.23   Sodium 135 - 145 mmol/L 137  138  137   Potassium 3.5 - 5.1 mmol/L 3.4  3.5  4.0   Chloride 98 - 111 mmol/L 97  92  97   CO2 22 - 32 mmol/L 29  36   31   Calcium  8.9 - 10.3 mg/dL 9.6  9.5  9.2   Total Protein 6.5 - 8.1 g/dL 6.4  6.8  6.3   Total Bilirubin 0.0 - 1.2 mg/dL 0.5  0.2  0.5   Alkaline Phos 38 - 126 U/L 164  169  137   AST 15 - 41 U/L 22  22  18    ALT 0 - 44 U/L 24  22  22       Lab Results  Component Value Date   CEA 11.87 (H) 01/06/2024   /  CEA [facility])  Date Value Ref Range Status  01/06/2024 11.87 (H) 0.00 - 5.00 ng/mL Final    Comment:    (NOTE) This test was performed using Beckman Coulter's paramagnetic chemiluminescent immunoassay. Values obtained from different assay methods cannot be used interchangeably. Please note that up to 8% of patients who smoke may see values 5.1-10.0 ng/ml and 1% of patients who smoke may see CEA levels >10.0 ng/ml. Performed at [facility], [address] [place], [place] [address]    No results found for: PSA1 No results found for: CAN199 No results found for: CAN125  No results found for: TOTALPROTELP, ALBUMINELP, A1GS, A2GS, BETS, BETA2SER, GAMS, MSPIKE, SPEI Lab Results  Component Value Date   TIBC 316 01/10/2024   FERRITIN 297 01/10/2024   IRONPCTSAT 27 01/10/2024   No results found for: LDH  STUDIES:  EXAM: 02/23/2024 CT ANGIO CHEST IMPRESSION: (No mediastinal lymph nodes are seen) 1. No evidence of pulmonary thromboembolism. 2. Minimal pericardial effusion measuring up to 5 mm. 3. Minimal to mild left pleural effusion with subpleural atelectasis in the posterior basal segments of both lower lung lobes. 4. Visualized centrilobular emphysema predominantly in bilateral upper lung lobes. 5.  Subpleural fibroatelectatic changes involving bilateral upper lung lobes, right middle love, lingular lobe, and bilateral lower lung lobes. 6. Mild left ventricular wall hypertrophy. 7. Atherosclerotic wall calcifications in the aorta and coronary arteries without evidence of aneurysm or dissection. 8. 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   EXAM: 01/18/2024 CT ANGIO CHEST IMPRESSION: Negative for pulmonary embolus. Extensive right hilar and mediastinal masses with mass effect as well as encompassing right hilar structures is unchanged. Peripheral right upper lobe mass. Extensive hepatic metastatic disease, likely relatively stable.   EXAM: 01/05/2024 MRI HEAD WITH WITHOUT CM IMPRESSION: Relatively age-appropriate atrophy and mall-vessel disease  EXAM: 01/05/2024 PET SKULL BASE TO MID-THIGH IMPRESSION: Irregular solid noncalcified peripheral right upper lobe mass likely primary neoplasm. Extensive mediastinal and right hilar metastatic adenopathy. Extensive hepatic metastatic disease. Probable single metastatic lesion in the left proximal femur. No definitive adrenal metastatic disease.   Exam: 12/29/2023 Pathology Impression: Lung, right upper lobe, needle core biopsy: Small Cell Carcinoma The tumor cells are positive for TTF-1 and neuroendocrine markers CD56, synaptophysin, and chromogranin. GATA-3 is negative, Pan keratin shows dot-like positive staining.    "

## 2024-03-08 LAB — TYPE AND SCREEN
ABO/RH(D): A POS
Antibody Screen: NEGATIVE
Unit division: 0

## 2024-03-08 LAB — BPAM RBC
Blood Product Expiration Date: 202601242359
ISSUE DATE / TIME: 202512301223
Unit Type and Rh: 6200

## 2024-03-13 ENCOUNTER — Other Ambulatory Visit: Payer: Self-pay | Admitting: Hematology and Oncology

## 2024-03-13 ENCOUNTER — Inpatient Hospital Stay (HOSPITAL_BASED_OUTPATIENT_CLINIC_OR_DEPARTMENT_OTHER): Admitting: Hematology and Oncology

## 2024-03-13 ENCOUNTER — Other Ambulatory Visit: Payer: Self-pay

## 2024-03-13 ENCOUNTER — Inpatient Hospital Stay

## 2024-03-13 ENCOUNTER — Inpatient Hospital Stay: Attending: Oncology

## 2024-03-13 DIAGNOSIS — Z79899 Other long term (current) drug therapy: Secondary | ICD-10-CM | POA: Diagnosis not present

## 2024-03-13 DIAGNOSIS — C3431 Malignant neoplasm of lower lobe, right bronchus or lung: Secondary | ICD-10-CM

## 2024-03-13 DIAGNOSIS — C787 Secondary malignant neoplasm of liver and intrahepatic bile duct: Secondary | ICD-10-CM | POA: Diagnosis present

## 2024-03-13 DIAGNOSIS — D649 Anemia, unspecified: Secondary | ICD-10-CM | POA: Insufficient documentation

## 2024-03-13 DIAGNOSIS — E871 Hypo-osmolality and hyponatremia: Secondary | ICD-10-CM | POA: Insufficient documentation

## 2024-03-13 DIAGNOSIS — Z5111 Encounter for antineoplastic chemotherapy: Secondary | ICD-10-CM | POA: Insufficient documentation

## 2024-03-13 DIAGNOSIS — D696 Thrombocytopenia, unspecified: Secondary | ICD-10-CM | POA: Insufficient documentation

## 2024-03-13 DIAGNOSIS — C3411 Malignant neoplasm of upper lobe, right bronchus or lung: Secondary | ICD-10-CM | POA: Insufficient documentation

## 2024-03-13 LAB — CBC WITH DIFFERENTIAL (CANCER CENTER ONLY)
Abs Immature Granulocytes: 0.18 K/uL — ABNORMAL HIGH (ref 0.00–0.07)
Basophils Absolute: 0 K/uL (ref 0.0–0.1)
Basophils Relative: 1 %
Eosinophils Absolute: 0 K/uL (ref 0.0–0.5)
Eosinophils Relative: 0 %
HCT: 25.4 % — ABNORMAL LOW (ref 36.0–46.0)
Hemoglobin: 8.3 g/dL — ABNORMAL LOW (ref 12.0–15.0)
Immature Granulocytes: 3 %
Lymphocytes Relative: 17 %
Lymphs Abs: 1 K/uL (ref 0.7–4.0)
MCH: 29.9 pg (ref 26.0–34.0)
MCHC: 32.7 g/dL (ref 30.0–36.0)
MCV: 91.4 fL (ref 80.0–100.0)
Monocytes Absolute: 0.7 K/uL (ref 0.1–1.0)
Monocytes Relative: 11 %
Neutro Abs: 4.2 K/uL (ref 1.7–7.7)
Neutrophils Relative %: 68 %
Platelet Count: 51 K/uL — ABNORMAL LOW (ref 150–400)
RBC: 2.78 MIL/uL — ABNORMAL LOW (ref 3.87–5.11)
RDW: 18.5 % — ABNORMAL HIGH (ref 11.5–15.5)
WBC Count: 6.1 K/uL (ref 4.0–10.5)
nRBC: 0.5 % — ABNORMAL HIGH (ref 0.0–0.2)

## 2024-03-13 LAB — CMP (CANCER CENTER ONLY)
ALT: 18 U/L (ref 0–44)
AST: 23 U/L (ref 15–41)
Albumin: 3.9 g/dL (ref 3.5–5.0)
Alkaline Phosphatase: 143 U/L — ABNORMAL HIGH (ref 38–126)
Anion gap: 11 (ref 5–15)
BUN: 18 mg/dL (ref 8–23)
CO2: 33 mmol/L — ABNORMAL HIGH (ref 22–32)
Calcium: 9.4 mg/dL (ref 8.9–10.3)
Chloride: 97 mmol/L — ABNORMAL LOW (ref 98–111)
Creatinine: 0.9 mg/dL (ref 0.44–1.00)
GFR, Estimated: >60 mL/min
Glucose, Bld: 174 mg/dL — ABNORMAL HIGH (ref 70–99)
Potassium: 3.2 mmol/L — ABNORMAL LOW (ref 3.5–5.1)
Sodium: 141 mmol/L (ref 135–145)
Total Bilirubin: 0.3 mg/dL (ref 0.0–1.2)
Total Protein: 6.5 g/dL (ref 6.5–8.1)

## 2024-03-13 LAB — MAGNESIUM: Magnesium: 1.2 mg/dL — ABNORMAL LOW (ref 1.7–2.4)

## 2024-03-13 MED ORDER — MAGNESIUM SULFATE 4 GM/100ML IV SOLN
4.0000 g | Freq: Once | INTRAVENOUS | Status: AC
  Administered 2024-03-13: 4 g via INTRAVENOUS
  Filled 2024-03-13: qty 100

## 2024-03-13 MED ORDER — POTASSIUM CHLORIDE 10 MEQ/100ML IV SOLN
10.0000 meq | INTRAVENOUS | Status: AC
  Administered 2024-03-13 (×3): 10 meq via INTRAVENOUS
  Filled 2024-03-13 (×3): qty 100

## 2024-03-13 MED ORDER — MAGNESIUM SULFATE 2 GM/50ML IV SOLN
2.0000 g | Freq: Once | INTRAVENOUS | Status: AC
  Administered 2024-03-13: 2 g via INTRAVENOUS
  Filled 2024-03-13: qty 50

## 2024-03-13 MED ORDER — SODIUM CHLORIDE 0.9 % IV SOLN: 6.0000 g | Freq: Once | INTRAVENOUS | Status: DC

## 2024-03-13 NOTE — Patient Instructions (Signed)
 Hypomagnesemia Hypomagnesemia is a condition in which the level of magnesium  in the blood is too low. Magnesium  is a mineral that is found in many foods. It is used in many different processes in the body. Hypomagnesemia can affect every organ in the body. In severe cases, it can cause life-threatening problems. What are the causes? This condition may be caused by: Not getting enough magnesium  in your diet or not having enough healthy foods to eat (malnutrition). Problems with magnesium  absorption in the intestines. Dehydration. Excessive use of alcohol. Vomiting. Severe or long-term (chronic) diarrhea. Some medicines, including medicines that make you urinate more often (diuretics). Certain diseases, such as kidney disease, diabetes, celiac disease, and overactive thyroid . What are the signs or symptoms? Symptoms of this condition include: Loss of appetite, nausea, and vomiting. Involuntary shaking or trembling of a body part (tremor). Muscle weakness or tingling in the arms and legs. Sudden tightening of muscles (muscle spasms). Confusion. Psychiatric issues, such as: Depression and irritability. Psychosis. A feeling of fluttering of the heart (palpitations). Seizures. These symptoms are more severe if magnesium  levels drop suddenly. How is this diagnosed? This condition may be diagnosed based on: Your symptoms and medical history. A physical exam. Blood and urine tests. How is this treated? Treatment depends on the cause and the severity of the condition. It may be treated by: Taking a magnesium  supplement. This can be taken in pill form. If the condition is severe, magnesium  is usually given through an IV. Making changes to your diet. You may be directed to eat foods that have a lot of magnesium , such as green leafy vegetables, peas, beans, and nuts. Not drinking alcohol. If you are struggling not to drink, ask your health care provider for help. Follow these instructions at  home: Eating and drinking     Make sure that your diet includes foods with magnesium . Foods that have a lot of magnesium  in them include: Green leafy vegetables, such as spinach and broccoli. Beans and peas. Nuts and seeds, such as almonds and sunflower seeds. Whole grains, such as whole grain bread and fortified cereals. Drink fluids that contain salts and minerals (electrolytes), such as sports drinks, when you are active. Do not drink alcohol. General instructions Take over-the-counter and prescription medicines only as told by your health care provider. Take magnesium  supplements as directed if your health care provider tells you to take them. Have your magnesium  levels monitored as told by your health care provider. Keep all follow-up visits. This is important. Contact a health care provider if: You get worse instead of better. Your symptoms return. Get help right away if: You develop severe muscle weakness. You have trouble breathing. You feel that your heart is racing. These symptoms may represent a serious problem that is an emergency. Do not wait to see if the symptoms will go away. Get medical help right away. Call your local emergency services (911 in the [place] Do not drive yourself to the hospital. Summary Hypomagnesemia is a condition in which the level of magnesium  in the blood is too low. Hypomagnesemia can affect every organ in the body. Treatment may include eating more foods that contain magnesium , taking magnesium  supplements, and not drinking alcohol. Have your magnesium  levels monitored as told by your health care provider. This information is not intended to replace advice given to you by your health care provider. Make sure you discuss any questions you have with your health care provider. Document Revised: 07/23/2020 Document Reviewed: 07/23/2020 [employer] Patient Education  2024 [employer]

## 2024-03-13 NOTE — Progress Notes (Signed)
 " [facility]  [address] [place],  [place]  [address] [phone]  Clinic Day: 03/13/2024  Referring physician: [name], MD  ASSESSMENT & PLAN:  Assessment:  Small Cell Lung Cancer This is newly diagnosed but found to be stage IV with liver metastases and significant bulky mediastinal adenopathy, right pleural based mass, and small right pleural effusion. She has been started on chemotherapy with Carboplatin , Etoposide , and Atezolizumab , with her first cycle given 6 weeks ago. She has had a rough time with cytopenias and ended up in the hospital twice but is over the worst. She did better with her second cycle but developed this pleuritic chest pain over the last few days. Dr. Cornelius reassured her that the cancer is responding and we want to continue with her treatment. Dr. Cornelius reviewed all the plans with the patient her daughter and we plan close follow-up and supportive care with weekly labs and visits.   Superior Vena Cava Syndrome Due to this, palliative radiation was added and is helping. She had increased pain in the upper mediastinum earlier this week, but that has improved and she is tapering off the oxycodone. The edema of her arms has resolved and neck and face are greatly decreased after palliative radiation.  Hyponatremia This is likely caused by SIADH from her small cell lung cancer and will be monitored closely. Her sodium is finally coming up now and was normal for the first time 2 weeks ago. Today it is normal at 137.   Hypomagnesemia She has received IV magnesium  on multiple occasions and we will continue to monitor this and supplement as needed. She is on oral 400 mg magnesium  oxide supplement. If her level continues to remain low, we may need to increase this to BID.  Pneumonia This was multifocal in November and at least partially related to post obstruction of the right lower main bronchus. We do not see these infiltrates on her current CT scan,  but she does have some atelectasis bilaterally.  Anemia This is likely related to her new small cell lung cancer and bilateral pneumonia. Her vitamin levels were all normal. Her hemoglobin today is 7.4. We will transfuse 1 unit today.   Neutropenia She had profound neutropenia with her first cycle, with an ANC down to 100 at her nadir but she did not receive Neulasta  due to the radiation. I have reviewed neutropenic precautions with her and her daughter, and her counts did recover. We were able to use Neulasta  with this second cycle. WBC today is 3.9, with ANC 2.9.  Thrombocytopenia Her platelet count is 110 today.   History of Stage IA (T1b N0 M0) hormone receptor positive right breast cancer This was diagnosed in April, 2005. She was treated with a lumpectomy and pathology revealed a 6 mm, grade 3, invasive ductal carcinoma with sentinel node positive with a 3 mm micrometastasis. Estrogen and progesterone receptors were positive and her 2 neu negative. She was treated with 4 cycles of doxorubicin and cyclophosphamide. She was then placed on Anastrozole in November, 2005 and took that for 3.59yrs, but was switched to Tamoxifen in April, 2009 because of her arthralgias related to Anastrozole. She completed 5 years of adjuvant hormonal therapy.    History of Stage IA (T1b N0 M0) hormone receptor positive left breast cancer This was diagnosed in July, 2015. This was treated with lumpectomy and pathology revealed a 6mm, grade 1, ductal carcinoma with negative sentinel node. Estrogen and progesterone receptors were positive  and her 2 neu negative and Ki 67 was 18%. She received adjuvant radiation to the left breast and was then placed on Anastrozole again in August, 2015. This was stopped in October, 2016 due to severe hot flashes, anxiety, and depression, mild cognitive difficulty, trouble finding words, and remembering things as well as becoming easily overwhelmed. She did not tolerate venlafaxine and  was placed on paroxetine 20mg  daily in September, 2015 and this improved her symptoms. She was later changed to Lexapro. She was placed on Tamoxifen 20 mg daily for adjuvant hormonal therapy for 5 years.   Plan: She presents today for weekly follow up. Hemoglobin is 8.3 today. CMP reveals mag 1.2 and potassium 3.2. We will replace both IV today. She will return to clinic in one week for repeat evaluation.   I provided 20 minutes of face-to-face time during this this encounter and > 50% was spent counseling as documented under my assessment and plan.   [name], NP [facility] OF [facility] [address] [place] [address] Dept: [phone] Dept Fax: [phone]   No orders of the defined types were placed in this encounter.  CHIEF COMPLAINT:  CC: Small Cell Lung Cancer  Current Treatment:  Chemotherapy and Immunotherapy   HISTORY OF PRESENT ILLNESS:  Heather Chavez is a 79 y.o. female with a history of bilateral breast cancer who is referred in consultation by Dr. [name] for assessment and management of newly diagnosed small cell lung cancer. She has a history of stage 1A right breast cancer 06/2003 (s/p lumpectomy, doxorubicin + cyclophosphamide, and 5 years of adjuvant hormonal therapy with tamoxifen/anastrozole), stage 1A left breast cancer (s/p lumpectomy, adjuvant radiation, and 5 years of adjuvant hormonal therapy with tamoxifen/anastrozole), COPD/asthma overlap, and tobacco use.  She underwent testing for hereditary breast and ovarian cancer with the [employer] OvaNext 25 gene panel, which did not reveal any clinically significant mutation. She did have a variant of uncertain significance of MSH2. She had a previous hysterectomy and unilateral oophorectomy for benign disease at a fairly young age, so does not require pap smears. Screening colonoscopy in February, 2017 revealed multiple polyps,  all of which were benign polypoid colonic mucosa.  Madalina was admitted into the [facility] ED on October 20th, 2025 and presented with shortness of breath, fevers, and chills. She received a chest x-ray for further evaluation which revealed an right upper lobe infiltrate versus mass and chest CT angio revealed emphysema with chronic interstitial changes in both lung fields corresponding with multifocal pneumonitis and a suspicious pleural based mass in the right upper lobe measuring 3.9cm, and multiple suspicious enlarged mediastinal lymph nodes.  She was diagnosed with mass of the right lung, acute hypoxic respiratory failure, and bilateral community acquired pneumonia before being transferred to [facility]. She then had bronchoscopy and biopsy and pathology report was positive for small cell carcinoma with positive tumor cells for TTF-1 and neuroendocrine markers CD56, synaptophysin, and chromogranin. GATA-3 is negative and Pan keratin showed dot-like positive staining. She comes to me for discussion of staging and treatment and had a PET scan and brain MRI today but results are pending. I recommend systemic chemotherapy with Carboplatin  and Etopiside. She will also have immunotherapy, either concurrent or sequential depending on her staging. She will have her chemoeducation tomorrow and port placed this Friday.   I have reviewed her chart and materials related to her cancer extensively  and collaborated history with the patient. Summary of oncologic history is as follows: Oncology History  Lung cancer, lower lobe (HCC)  01/05/2024 Initial Diagnosis   Lung cancer, lower lobe (HCC)   01/06/2024 Cancer Staging   Staging form: Lung, AJCC V9 - Clinical stage from 01/06/2024: Stage IVB (cT4, cN2b, cM1c1) - Signed by Cornelius Wanda DEL, MD on 01/06/2024 Histopathologic type: Small cell carcinoma, NOS Stage prefix: Initial diagnosis Method of lymph node assessment: Clinical IASLC grade:  G3 Histologic grading system: 3 grade system Laterality: Right Tumor size (mm): 56 Sites of metastasis: Liver Lymph-vascular invasion (LVI): Presence of LVI unknown/indeterminate Diagnostic confirmation: Positive histology Specimen type: Bronchial Biopsy Staged by: Managing physician Standard uptake value (SUV): 12.5 Perineural invasion (PNI): Unknown Pleural/elastic layer invasion: Unknown Stage used in treatment planning: Yes National guidelines used in treatment planning: Yes Type of national guideline used in treatment planning: NCCN   01/10/2024 -  Chemotherapy   Patient is on Treatment Plan : LUNG SCLC Carboplatin  + Etoposide  + Atezolizumab  Induction q21d x 4 cycles / Atezolizumab  Maintenance q21d      INTERVAL HISTORY:  Gerardo is seen in the clinic for follow up of her extensive-stage small cell lung carcinoma, diagnosed in October 2025. She developed superior vena cava syndrome and had some palliative radiation. She has received 2 cycles of chemotherapy and had many complications after the first one but did better the second time. Patient states that she feels poorly and complains of stabbing chest pain worse with inspiration, shortness of breath, and  leg swelling R>L. She is on continuous oxygen  support via nasal cannula. She was directed to [facility] ED on 02/23/2024 for left upper chest pain, sharp and worse with inspiration, radiating to her left shoulder described as a dull ache, shortness of breath, and left leg swelling. Venous ultrasound of the leg was negative for DVT. Her blood culture on 02/23/2024 was negative and her urinalysis was normal. She had a CTA on 02/23/2024 which revealed no mediastinal lymph nodes, no evidence of pulmonary thromboembolism, minimal pericardial effusion measuring up to 5 mm, minimal to mild left pleural effusion with subpleural atelectasis in the posterior basal segments of both lower lung lobes, visualized centrilobular emphysema  predominantly in bilateral upper lung lobes, subpleural fibroatelectatic changes involving bilateral upper lung lobes, right middle love, lingular lobe, and bilateral lower lung lobes, mild left ventricular wall hypertrophy, atherosclerotic wall calcifications in the aorta and coronary arteries without evidence of aneurysm or dissection, and elevation of the right dome of the diaphragm with Chilaiditi's sign showing colonic interposition between the right diaphragm and liver surface. She affirms low urine output and worsening edema, but denies dysuria. Her labs were performed at [facility] on 02/23/2024 and she had an elevated WBC of 20.3 with an ANC of 16,040, a low hemoglobin of 9.6, and a low platelet count of 59,000. Her CMP revealed a low sodium of 133 and an elevated blood glucose of 201. Since the CTA does not show her lung cancer, pulmonary emboli, or pneumonia, I think her pain is pleurisy and reassured her that it should pass. She is currently taking 40 mg furosemide daily. I instructed her to discontinue this and begin 20 mg torsemide  BID then decrease to once daily if her swelling is under control. I also prescribed 10-300 mg hydrocodone -acetaminophen  every 4 hours PRN to control her pain since the oxycodone 10 mg seems too strong, but 5 mg was not strong enough.  I will see her back one week  later with CBC, CMP, and magnesium .  She denies fever, chills, night sweats, or other signs of infection. She denies cardiorespiratory and gastrointestinal issues. She  denies pain. Her appetite is not good and Her weight has increased 5 pounds over last 7 daysand she is taking diuretics. She is accompanied by her daughter.   HISTORY:   Past Medical History:  Diagnosis Date   Advanced nonexudative age-related macular degeneration of right eye with subfoveal involvement 09/21/2019   Advanced nonexudative age-related macular degeneration of right eye without subfoveal involvement 09/21/2019   Allergic  rhinitis    Angina pectoris 05/17/2020   Bilateral breast cancer (HCC) 11/23/2013   BMI 34.0-34.9,adult 09/25/2019   Breast cancer (HCC)    Bronchitis, chronic (HCC)    CAD (coronary artery disease)    Cancer (HCC)    Cardiac murmur 05/17/2020   Coronary artery calcification 05/17/2020   Degenerative retinal drusen of right eye 09/21/2019   Diabetes mellitus due to underlying condition with unspecified complications (HCC) 05/17/2020   Diverticulosis    Dry eyes 06/25/2016   Dyspnea    Emphysema/COPD    Essential hypertension 05/17/2020   Estrogen receptor positive neoplasm 09/24/2015   Family history of malignant neoplasm of breast 11/23/2013   Family history of peritoneal cancer 11/23/2013   Fuchs' corneal dystrophy 11/03/2016   GERD (gastroesophageal reflux disease)    History of colon polyps    History of right breast cancer 09/19/2018   Hypertension    IBS (irritable bowel syndrome)    with D   Intermediate stage nonexudative age-related macular degeneration of left eye 09/21/2019   Iritis 11/03/2016   Macular degeneration    Major depressive disorder    Malignant neoplasm of upper-inner quadrant of left breast in female, estrogen receptor positive (HCC) 09/01/2016   Mixed dyslipidemia 05/17/2020   Obesity    Obesity (BMI 35.0-39.9 without comorbidity) 05/17/2020   OSA on CPAP 10/29/2020   Diagnosed some 3 months prior after pneumonia episode, Dr. Shellia pulmonology   Peripheral vascular disease    Personal history of chemotherapy    Personal history of radiation therapy    Pseudophakia 09/21/2019   Right-sided chest wall pain    SVT (supraventricular tachycardia) 05/23/2020   Varicose veins of left lower extremity with complications 08/11/2016   Past Surgical History:  Procedure Laterality Date   BREAST BIOPSY     BREAST LUMPECTOMY     right brwast 2005/ left 2015   CATARACT EXTRACTION     CHOLECYSTECTOMY     COLONOSCOPY  04/17/2015   Colonic polyps status post  polypectomy. Moderate to severe sigmoid diverticulosis   ENDOVENOUS ABLATION SAPHENOUS VEIN W/ LASER Right 02/19/2023   endovenous laser ablation right greater saphenous vein and stab phlebectomy 10-20 incisions right leg by Gaile New MD   TUBAL LIGATION     VAGINAL HYSTERECTOMY     VEIN LIGATION AND STRIPPING     Family History  Problem Relation Age of Onset   Cancer Mother 62       bilateral breast cancer ages 72 then 71. Peritoneal cancer at age 70.   Other Mother        Ms. Blacketer's mother was adopted so have no information regarding maternal family  history other than her mother   Breast cancer Mother    Cancer Father 55       renal   Cancer Sister 53       breast   Breast cancer Sister    Breast  cancer Daughter    Breast cancer Maternal Aunt    Breast cancer Paternal Aunt    Breast cancer Paternal Grandmother    Cancer Paternal Grandmother        bilateral breast cancer at unknown age   Social History:  reports that she quit smoking about 43 years ago. Her smoking use included cigarettes. She started smoking about 58 years ago. She has a 37.5 pack-year smoking history. She has never used smokeless tobacco. She reports current alcohol use. She reports that she does not use drugs.The patient is accompanied by her daughter and granddaughter today.  Allergies:  Allergies  Allergen Reactions   Codeine Nausea And Vomiting and Other (See Comments)    Other reaction(s): Other (see comments), Vomiting   Levofloxacin Other (See Comments)    Leg cramps   Nexlizet  [Bempedoic Acid-Ezetimibe ] Other (See Comments)   Pitavastatin  Other (See Comments)    Leg cramps  Other Reaction(s): Other (See Comments)  Leg cramps    Leg cramps   Rosuvastatin  Other (See Comments)    Muscle pain    Current Medications: Current Outpatient Medications  Medication Sig Dispense Refill   albuterol  (VENTOLIN  HFA) 108 (90 Base) MCG/ACT inhaler Inhale 2 puffs into the lungs every 4 (four) hours as  needed for wheezing.     Biotin 89999 MCG TABS Take 10,000 mcg by mouth daily.     budesonide -formoterol  (SYMBICORT ) 80-4.5 MCG/ACT inhaler Inhale 2 puffs into the lungs 2 (two) times daily.     chlorpheniramine-HYDROcodone  (TUSSIONEX) 10-8 MG/5ML Take 5 mLs by mouth every 12 (twelve) hours as needed for cough. 473 mL 0   gabapentin (NEURONTIN) 600 MG tablet Take 600 mg by mouth 2 (two) times daily.     GARLIC PO Take 1 tablet by mouth daily.     HYDROcodone -Acetaminophen  10-300 MG TABS Take 1 tablet by mouth every 4 (four) hours as needed. 100 tablet 0   ipratropium-albuterol  (DUONEB) 0.5-2.5 (3) MG/3ML SOLN Take 3 mLs by nebulization every 4 (four) hours as needed (breathing treatment).     LORazepam  (ATIVAN ) 0.5 MG tablet Take by mouth.     magnesium  oxide (MAG-OX) 400 (240 Mg) MG tablet Take 1 tablet (400 mg total) by mouth daily. 30 tablet 5   metFORMIN (GLUCOPHAGE) 500 MG tablet Take 500 mg by mouth daily.     metoprolol  tartrate (LOPRESSOR ) 100 MG tablet Take 150 mg by mouth 2 (two) times daily.     montelukast (SINGULAIR) 10 MG tablet Take 10 mg by mouth at bedtime.     Multiple Vitamins-Minerals (PRESERVISION AREDS 2 PO) Take by mouth.     nitroGLYCERIN  (NITROSTAT ) 0.4 MG SL tablet Place 0.4 mg under the tongue every 5 (five) minutes as needed for chest pain.     ondansetron  (ZOFRAN ) 4 MG tablet Take 1 tablet (4 mg total) by mouth every 4 (four) hours as needed for nausea. 90 tablet 3   ondansetron  (ZOFRAN -ODT) 8 MG disintegrating tablet Take by mouth.     OXYGEN  Inhale 2.5 L/min into the lungs continuous.     pantoprazole  (PROTONIX ) 40 MG tablet Take 1 tablet (40 mg total) by mouth daily. Call [phone] to schedule an office visit for more refills 90 tablet 0   potassium chloride  SA (KLOR-CON  M) 20 MEQ tablet Take 1 tablet (20 mEq total) by mouth daily. 30 tablet 5   prochlorperazine  (COMPAZINE ) 10 MG tablet Take 1 tablet (10 mg total) by mouth every 6 (six) hours as needed for  nausea  or vomiting. 90 tablet 3   torsemide  (DEMADEX ) 20 MG tablet Take 2 tablets (40 mg total) by mouth daily. 60 tablet 5   vitamin E 180 MG (400 UNITS) capsule Take 400 Units by mouth daily.     zinc gluconate 50 MG tablet Take 50 mg by mouth daily.     allopurinol  (ZYLOPRIM ) 300 MG tablet Take 1 tablet (300 mg total) by mouth daily. (Patient not taking: Reported on 03/07/2024) 30 tablet 0   diltiazem  (CARDIZEM  CD) 120 MG 24 hr capsule Take 1 capsule (120 mg total) by mouth daily. 90 capsule 0   fluocinonide cream (LIDEX) 0.05 % Apply 1 Application topically 3 (three) times daily. (Patient not taking: Reported on 03/07/2024)     loratadine (CLARITIN) 10 MG tablet Take 10 mg by mouth daily.     magic mouthwash w/lidocaine  SOLN Take 5 mLs by mouth 4 (four) times daily. Suspension contains equal amounts of Maalox Extra Strength, nystatin , diphenhydramine  and lidocaine . (Patient not taking: Reported on 03/07/2024)     nystatin  (MYCOSTATIN /NYSTOP ) powder Apply 1 Application topically 3 (three) times daily. (Patient not taking: Reported on 03/07/2024) 15 g 0   sertraline (ZOLOFT) 50 MG tablet Take 50 mg by mouth daily.     No current facility-administered medications for this visit.   Facility-Administered Medications Ordered in Other Visits  Medication Dose Route Frequency Provider Last Rate Last Admin   magnesium  sulfate IVPB 2 g 50 mL  2 g Intravenous Once Liane Tribbey A, NP       magnesium  sulfate IVPB 4 g 100 mL  4 g Intravenous Once Cayla Wiegand A, NP 50 mL/hr at 03/13/24 1145 4 g at 03/13/24 1145    REVIEW OF SYSTEMS:  Review of Systems  Constitutional: Negative.  Negative for appetite change, chills, diaphoresis, fatigue, fever and unexpected weight change.  HENT:   Positive for nosebleeds. Negative for hearing loss, lump/mass, mouth sores, sore throat, tinnitus, trouble swallowing and voice change.   Eyes:  Positive for eye problems (vision changes).  Respiratory:  Positive for  cough and shortness of breath. Negative for chest tightness, hemoptysis and wheezing.   Cardiovascular:  Positive for chest pain (pleuritic type) and leg swelling (bilateral). Negative for palpitations.  Gastrointestinal:  Negative for abdominal distention, abdominal pain, blood in stool, constipation, diarrhea, nausea, rectal pain and vomiting.       Bowel incontinence upon straining  Endocrine: Negative.   Genitourinary:  Positive for bladder incontinence (upon straining). Negative for difficulty urinating, dyspareunia, dysuria, frequency, hematuria, menstrual problem, nocturia, pelvic pain, vaginal bleeding and vaginal discharge.   Musculoskeletal: Negative.  Negative for arthralgias, back pain, flank pain, gait problem, myalgias, neck pain and neck stiffness.  Skin: Negative.  Negative for itching, rash and wound.  Neurological:  Positive for headaches and numbness (neuropathy of bilateral feet). Negative for dizziness, extremity weakness, gait problem, light-headedness, seizures and speech difficulty.       Tremor  Hematological:  Negative for adenopathy. Bruises/bleeds easily.  Psychiatric/Behavioral: Negative.  Negative for depression and sleep disturbance. The patient is not nervous/anxious.    VITALS:  Blood pressure 106/60, pulse 92, temperature 98.7 F (37.1 C), temperature source Oral, resp. rate 16, height 5' 5 (1.651 m), weight 202 lb 14.4 oz (92 kg), SpO2 100%.  Wt Readings from Last 3 Encounters:  03/13/24 202 lb 14.4 oz (92 kg)  03/07/24 200 lb 12.8 oz (91.1 kg)  02/28/24 209 lb 3.2 oz (94.9 kg)    Body mass index is  33.76 kg/m.  Performance status (ECOG): 1 - Symptomatic but completely ambulatory  PHYSICAL EXAM:  Physical Exam Vitals and nursing note reviewed. Exam conducted with a chaperone present.  Constitutional:      General: She is not in acute distress.    Appearance: Normal appearance. She is normal weight. She is not ill-appearing, toxic-appearing or  diaphoretic.  HENT:     Head: Normocephalic and atraumatic.     Right Ear: Tympanic membrane, ear canal and external ear normal. There is no impacted cerumen.     Left Ear: Tympanic membrane, ear canal and external ear normal. There is no impacted cerumen.     Nose: Nose normal. No congestion or rhinorrhea.     Mouth/Throat:     Mouth: Mucous membranes are moist.     Pharynx: Oropharynx is clear. No oropharyngeal exudate or posterior oropharyngeal erythema.  Eyes:     General: No scleral icterus.       Right eye: No discharge.        Left eye: No discharge.     Extraocular Movements: Extraocular movements intact.     Conjunctiva/sclera: Conjunctivae normal.     Pupils: Pupils are equal, round, and reactive to light.  Cardiovascular:     Rate and Rhythm: Normal rate and regular rhythm.     Pulses: Normal pulses.     Heart sounds: Normal heart sounds. No murmur heard.    No friction rub. No gallop.  Pulmonary:     Effort: Pulmonary effort is normal. No respiratory distress.     Breath sounds: Examination of the right-lower field reveals decreased breath sounds. Examination of the left-lower field reveals decreased breath sounds. Decreased breath sounds present.  Abdominal:     General: Bowel sounds are normal. There is no distension.     Palpations: Abdomen is soft. There is no hepatomegaly, splenomegaly or mass.     Tenderness: There is no abdominal tenderness. There is no right CVA tenderness, left CVA tenderness, guarding or rebound.     Hernia: No hernia is present.  Musculoskeletal:        General: Normal range of motion.     Cervical back: Normal range of motion and neck supple.     Right lower leg: Edema (1-2+) present.     Left lower leg: Edema (1-2+) present.     Comments: Bilateral lower leg 1-2+ edema  Lymphadenopathy:     Cervical: No cervical adenopathy.     Right cervical: No superficial, deep or posterior cervical adenopathy.    Left cervical: No superficial, deep  or posterior cervical adenopathy.     Upper Body:     Right upper body: No supraclavicular, axillary or pectoral adenopathy.     Left upper body: No supraclavicular, axillary or pectoral adenopathy.  Skin:    General: Skin is warm and dry.  Neurological:     General: No focal deficit present.     Mental Status: She is alert and oriented to person, place, and time. Mental status is at baseline.  Psychiatric:        Mood and Affect: Mood normal.        Behavior: Behavior normal.        Thought Content: Thought content normal.        Judgment: Judgment normal.    LABS:      Latest Ref Rng & Units 03/13/2024   10:05 AM 03/07/2024    9:26 AM 02/28/2024    9:00 AM  CBC  WBC 4.0 - 10.5 K/uL 6.1  3.9  12.3   Hemoglobin 12.0 - 15.0 g/dL 8.3  7.4  8.8   Hematocrit 36.0 - 46.0 % 25.4  23.1  28.0   Platelets 150 - 400 K/uL 51  110  151       Latest Ref Rng & Units 03/13/2024   10:05 AM 03/07/2024    9:26 AM 02/28/2024    9:00 AM  CMP  Glucose 70 - 99 mg/dL 825  830  781   BUN 8 - 23 mg/dL 18  20  17    Creatinine 0.44 - 1.00 mg/dL 9.09  9.27  9.15   Sodium 135 - 145 mmol/L 141  137  138   Potassium 3.5 - 5.1 mmol/L 3.2  3.4  3.5   Chloride 98 - 111 mmol/L 97  97  92   CO2 22 - 32 mmol/L 33  29  36   Calcium  8.9 - 10.3 mg/dL 9.4  9.6  9.5   Total Protein 6.5 - 8.1 g/dL 6.5  6.4  6.8   Total Bilirubin 0.0 - 1.2 mg/dL 0.3  0.5  0.2   Alkaline Phos 38 - 126 U/L 143  164  169   AST 15 - 41 U/L 23  22  22    ALT 0 - 44 U/L 18  24  22       Lab Results  Component Value Date   CEA 11.87 (H) 01/06/2024   /  CEA [facility])  Date Value Ref Range Status  01/06/2024 11.87 (H) 0.00 - 5.00 ng/mL Final    Comment:    (NOTE) This test was performed using Beckman Coulter's paramagnetic chemiluminescent immunoassay. Values obtained from different assay methods cannot be used interchangeably. Please note that up to 8% of patients who smoke may see values 5.1-10.0 ng/ml and 1% of patients who  smoke may see CEA levels >10.0 ng/ml. Performed at [facility], [address] [place], [place] [address]    No results found for: PSA1 No results found for: CAN199 No results found for: CAN125  No results found for: TOTALPROTELP, ALBUMINELP, A1GS, A2GS, BETS, BETA2SER, GAMS, MSPIKE, SPEI Lab Results  Component Value Date   TIBC 316 01/10/2024   FERRITIN 297 01/10/2024   IRONPCTSAT 27 01/10/2024   No results found for: LDH  STUDIES:  EXAM: 02/23/2024 CT ANGIO CHEST IMPRESSION: (No mediastinal lymph nodes are seen) 1. No evidence of pulmonary thromboembolism. 2. Minimal pericardial effusion measuring up to 5 mm. 3. Minimal to mild left pleural effusion with subpleural atelectasis in the posterior basal segments of both lower lung lobes. 4. Visualized centrilobular emphysema predominantly in bilateral upper lung lobes. 5. Subpleural fibroatelectatic changes involving bilateral upper lung lobes, right middle love, lingular lobe, and bilateral lower lung lobes. 6. Mild left ventricular wall hypertrophy. 7. Atherosclerotic wall calcifications in the aorta and coronary arteries without evidence of aneurysm or dissection. 8. 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   EXAM: 01/18/2024 CT ANGIO CHEST IMPRESSION: Negative for pulmonary embolus. Extensive right hilar and mediastinal masses with mass effect as well as encompassing right hilar structures is unchanged. Peripheral right upper lobe mass. Extensive hepatic metastatic disease, likely relatively stable.   EXAM: 01/05/2024 MRI HEAD WITH WITHOUT CM IMPRESSION: Relatively age-appropriate atrophy and mall-vessel disease  EXAM: 01/05/2024 PET SKULL BASE TO MID-THIGH IMPRESSION: Irregular solid noncalcified peripheral right upper lobe mass likely primary  neoplasm. Extensive  mediastinal and right hilar metastatic adenopathy. Extensive hepatic metastatic disease. Probable single metastatic lesion in the left proximal femur. No definitive adrenal metastatic disease.   Exam: 12/29/2023 Pathology Impression: Lung, right upper lobe, needle core biopsy: Small Cell Carcinoma The tumor cells are positive for TTF-1 and neuroendocrine markers CD56, synaptophysin, and chromogranin. GATA-3 is negative, Pan keratin shows dot-like positive staining.    "

## 2024-03-17 ENCOUNTER — Other Ambulatory Visit: Payer: Self-pay | Admitting: Oncology

## 2024-03-17 DIAGNOSIS — C3430 Malignant neoplasm of lower lobe, unspecified bronchus or lung: Secondary | ICD-10-CM

## 2024-03-20 ENCOUNTER — Inpatient Hospital Stay

## 2024-03-20 ENCOUNTER — Inpatient Hospital Stay: Admitting: Hematology and Oncology

## 2024-03-20 ENCOUNTER — Other Ambulatory Visit: Payer: Self-pay

## 2024-03-20 DIAGNOSIS — Z5111 Encounter for antineoplastic chemotherapy: Secondary | ICD-10-CM | POA: Diagnosis not present

## 2024-03-20 DIAGNOSIS — C3431 Malignant neoplasm of lower lobe, right bronchus or lung: Secondary | ICD-10-CM

## 2024-03-20 LAB — CMP (CANCER CENTER ONLY)
ALT: 15 U/L (ref 0–44)
AST: 21 U/L (ref 15–41)
Albumin: 3.9 g/dL (ref 3.5–5.0)
Alkaline Phosphatase: 135 U/L — ABNORMAL HIGH (ref 38–126)
Anion gap: 10 (ref 5–15)
BUN: 15 mg/dL (ref 8–23)
CO2: 32 mmol/L (ref 22–32)
Calcium: 9.8 mg/dL (ref 8.9–10.3)
Chloride: 98 mmol/L (ref 98–111)
Creatinine: 0.89 mg/dL (ref 0.44–1.00)
GFR, Estimated: >60 mL/min
Glucose, Bld: 155 mg/dL — ABNORMAL HIGH (ref 70–99)
Potassium: 3.9 mmol/L (ref 3.5–5.1)
Sodium: 139 mmol/L (ref 135–145)
Total Bilirubin: 0.2 mg/dL (ref 0.0–1.2)
Total Protein: 6.6 g/dL (ref 6.5–8.1)

## 2024-03-20 LAB — CBC WITH DIFFERENTIAL (CANCER CENTER ONLY)
Abs Immature Granulocytes: 0.17 K/uL — ABNORMAL HIGH (ref 0.00–0.07)
Basophils Absolute: 0 K/uL (ref 0.0–0.1)
Basophils Relative: 0 %
Eosinophils Absolute: 0 K/uL (ref 0.0–0.5)
Eosinophils Relative: 0 %
HCT: 25.2 % — ABNORMAL LOW (ref 36.0–46.0)
Hemoglobin: 8.1 g/dL — ABNORMAL LOW (ref 12.0–15.0)
Immature Granulocytes: 2 %
Lymphocytes Relative: 11 %
Lymphs Abs: 1 K/uL (ref 0.7–4.0)
MCH: 30.5 pg (ref 26.0–34.0)
MCHC: 32.1 g/dL (ref 30.0–36.0)
MCV: 94.7 fL (ref 80.0–100.0)
Monocytes Absolute: 0.9 K/uL (ref 0.1–1.0)
Monocytes Relative: 10 %
Neutro Abs: 7.2 K/uL (ref 1.7–7.7)
Neutrophils Relative %: 77 %
Platelet Count: 157 K/uL (ref 150–400)
RBC: 2.66 MIL/uL — ABNORMAL LOW (ref 3.87–5.11)
RDW: 20.5 % — ABNORMAL HIGH (ref 11.5–15.5)
WBC Count: 9.3 K/uL (ref 4.0–10.5)
nRBC: 0 % (ref 0.0–0.2)

## 2024-03-20 LAB — MAGNESIUM: Magnesium: 1.4 mg/dL — ABNORMAL LOW (ref 1.7–2.4)

## 2024-03-21 ENCOUNTER — Other Ambulatory Visit: Payer: Self-pay

## 2024-03-22 ENCOUNTER — Inpatient Hospital Stay

## 2024-03-22 VITALS — BP 132/68 | HR 85 | Temp 98.3°F | Resp 20

## 2024-03-22 DIAGNOSIS — Z5111 Encounter for antineoplastic chemotherapy: Secondary | ICD-10-CM | POA: Diagnosis not present

## 2024-03-22 DIAGNOSIS — C3430 Malignant neoplasm of lower lobe, unspecified bronchus or lung: Secondary | ICD-10-CM

## 2024-03-22 MED ORDER — DEXAMETHASONE SOD PHOSPHATE PF 10 MG/ML IJ SOLN
10.0000 mg | Freq: Once | INTRAMUSCULAR | Status: AC
  Administered 2024-03-22: 10 mg via INTRAVENOUS
  Filled 2024-03-22: qty 1

## 2024-03-22 MED ORDER — SODIUM CHLORIDE 0.9 % IV SOLN: INTRAVENOUS | Status: DC | PRN

## 2024-03-22 MED ORDER — MAGNESIUM SULFATE 2 GM/50ML IV SOLN
2.0000 g | Freq: Once | INTRAVENOUS | Status: AC
  Administered 2024-03-22: 2 g via INTRAVENOUS
  Filled 2024-03-22: qty 50

## 2024-03-22 MED ORDER — APREPITANT 130 MG/18ML IV EMUL
130.0000 mg | Freq: Once | INTRAVENOUS | Status: AC
  Administered 2024-03-22: 130 mg via INTRAVENOUS
  Filled 2024-03-22: qty 18

## 2024-03-22 MED ORDER — SODIUM CHLORIDE 0.9 % IV SOLN
434.2500 mg | Freq: Once | INTRAVENOUS | Status: AC
  Administered 2024-03-22: 430 mg via INTRAVENOUS
  Filled 2024-03-22: qty 43

## 2024-03-22 MED ORDER — SODIUM CHLORIDE 0.9 % IV SOLN
100.0000 mg/m2 | Freq: Once | INTRAVENOUS | Status: AC
  Administered 2024-03-22: 200 mg via INTRAVENOUS
  Filled 2024-03-22: qty 10

## 2024-03-22 MED ORDER — SODIUM CHLORIDE 0.9 % IV SOLN: INTRAVENOUS | Status: DC

## 2024-03-22 MED ORDER — SODIUM CHLORIDE 0.9 % IV SOLN
1200.0000 mg | Freq: Once | INTRAVENOUS | Status: AC
  Administered 2024-03-22: 1200 mg via INTRAVENOUS
  Filled 2024-03-22: qty 20

## 2024-03-22 MED ORDER — MAGNESIUM SULFATE 4 GM/100ML IV SOLN
4.0000 g | Freq: Once | INTRAVENOUS | Status: AC
  Administered 2024-03-22: 4 g via INTRAVENOUS
  Filled 2024-03-22: qty 100

## 2024-03-22 MED ORDER — PALONOSETRON HCL INJECTION 0.25 MG/5ML
0.2500 mg | Freq: Once | INTRAVENOUS | Status: AC
  Administered 2024-03-22: 0.25 mg via INTRAVENOUS
  Filled 2024-03-22: qty 5

## 2024-03-22 NOTE — Patient Instructions (Signed)
 [facility] [place] - A DEPT OF [facility]  Discharge Instructions: Thank you for choosing [facility] to provide your oncology and hematology care.  If you have a lab appointment with the Cancer Center, please go directly to the Cancer Center and check in at the registration area.   Wear comfortable clothing and clothing appropriate for easy access to any Portacath or PICC line.   We strive to give you quality time with your provider. You may need to reschedule your appointment if you arrive late (15 or more minutes).  Arriving late affects you and other patients whose appointments are after yours.  Also, if you miss three or more appointments without notifying the office, you may be dismissed from the clinic at the providers discretion.      For prescription refill requests, have your pharmacy contact our office and allow 72 hours for refills to be completed.    Today you received the following chemotherapy and/or immunotherapy agents Atezolizumab /Carboplatin /Etoposide        To help prevent nausea and vomiting after your treatment, we encourage you to take your nausea medication as directed.  BELOW ARE SYMPTOMS THAT SHOULD BE REPORTED IMMEDIATELY: *FEVER GREATER THAN 100.4 F (38 C) OR HIGHER *CHILLS OR SWEATING *NAUSEA AND VOMITING THAT IS NOT CONTROLLED WITH YOUR NAUSEA MEDICATION *UNUSUAL SHORTNESS OF BREATH *UNUSUAL BRUISING OR BLEEDING *URINARY PROBLEMS (pain or burning when urinating, or frequent urination) *BOWEL PROBLEMS (unusual diarrhea, constipation, pain near the anus) TENDERNESS IN MOUTH AND THROAT WITH OR WITHOUT PRESENCE OF ULCERS (sore throat, sores in mouth, or a toothache) UNUSUAL RASH, SWELLING OR PAIN  UNUSUAL VAGINAL DISCHARGE OR ITCHING   Items with * indicate a potential emergency and should be followed up as soon as possible or go to the Emergency Department if any problems should occur.  Please show the CHEMOTHERAPY ALERT  CARD or IMMUNOTHERAPY ALERT CARD at check-in to the Emergency Department and triage nurse.  Should you have questions after your visit or need to cancel or reschedule your appointment, please contact [facility] [place] - A DEPT [facility]  Dept: [phone]  and follow the prompts.  Office hours are 8:00 a.m. to 4:30 p.m. Monday - Friday. Please note that voicemails left after 4:00 p.m. may not be returned until the following business day.  We are closed weekends and major holidays. You have access to a nurse at all times for urgent questions. Please call the main number to the clinic Dept: [phone] and follow the prompts.  For any non-urgent questions, you may also contact your provider using MyChart. We now offer e-Visits for anyone 74 and older to request care online for non-urgent symptoms. For details visit [email or web address].   Also download the MyChart app! Go to the app store, search MyChart, open the app, select [place], and log in with your MyChart username and password.

## 2024-03-23 ENCOUNTER — Inpatient Hospital Stay

## 2024-03-23 VITALS — BP 135/74 | HR 81 | Temp 98.0°F | Resp 20

## 2024-03-23 DIAGNOSIS — C3430 Malignant neoplasm of lower lobe, unspecified bronchus or lung: Secondary | ICD-10-CM

## 2024-03-23 DIAGNOSIS — Z5111 Encounter for antineoplastic chemotherapy: Secondary | ICD-10-CM | POA: Diagnosis not present

## 2024-03-23 MED ORDER — SODIUM CHLORIDE 0.9 % IV SOLN
100.0000 mg/m2 | Freq: Once | INTRAVENOUS | Status: AC
  Administered 2024-03-23: 200 mg via INTRAVENOUS
  Filled 2024-03-23: qty 10

## 2024-03-23 MED ORDER — SODIUM CHLORIDE 0.9 % IV SOLN: INTRAVENOUS | Status: DC

## 2024-03-23 MED ORDER — DEXAMETHASONE SOD PHOSPHATE PF 10 MG/ML IJ SOLN
10.0000 mg | Freq: Once | INTRAMUSCULAR | Status: AC
  Administered 2024-03-23: 10 mg via INTRAVENOUS
  Filled 2024-03-23: qty 1

## 2024-03-23 MED FILL — Etoposide Inj 1 GM/50ML (20 MG/ML): INTRAVENOUS | Qty: 10 | Status: AC

## 2024-03-23 NOTE — Patient Instructions (Signed)
 [facility] [place] - A DEPT OF [place]  Discharge Instructions: Thank you for choosing [facility] to provide your oncology and hematology care.  If you have a lab appointment with the Cancer Center, please go directly to the Cancer Center and check in at the registration area.   Wear comfortable clothing and clothing appropriate for easy access to any Portacath or PICC line.   We strive to give you quality time with your provider. You may need to reschedule your appointment if you arrive late (15 or more minutes).  Arriving late affects you and other patients whose appointments are after yours.  Also, if you miss three or more appointments without notifying the office, you may be dismissed from the clinic at the providers discretion.      For prescription refill requests, have your pharmacy contact our office and allow 72 hours for refills to be completed.    Today you received the following chemotherapy and/or immunotherapy agents Etoposide        To help prevent nausea and vomiting after your treatment, we encourage you to take your nausea medication as directed.  BELOW ARE SYMPTOMS THAT SHOULD BE REPORTED IMMEDIATELY: *FEVER GREATER THAN 100.4 F (38 C) OR HIGHER *CHILLS OR SWEATING *NAUSEA AND VOMITING THAT IS NOT CONTROLLED WITH YOUR NAUSEA MEDICATION *UNUSUAL SHORTNESS OF BREATH *UNUSUAL BRUISING OR BLEEDING *URINARY PROBLEMS (pain or burning when urinating, or frequent urination) *BOWEL PROBLEMS (unusual diarrhea, constipation, pain near the anus) TENDERNESS IN MOUTH AND THROAT WITH OR WITHOUT PRESENCE OF ULCERS (sore throat, sores in mouth, or a toothache) UNUSUAL RASH, SWELLING OR PAIN  UNUSUAL VAGINAL DISCHARGE OR ITCHING   Items with * indicate a potential emergency and should be followed up as soon as possible or go to the Emergency Department if any problems should occur.  Please show the CHEMOTHERAPY ALERT CARD or IMMUNOTHERAPY  ALERT CARD at check-in to the Emergency Department and triage nurse.  Should you have questions after your visit or need to cancel or reschedule your appointment, please contact [facility] [place] [facility]  Dept: [phone]  and follow the prompts.  Office hours are 8:00 a.m. to 4:30 p.m. Monday - Friday. Please note that voicemails left after 4:00 p.m. may not be returned until the following business day.  We are closed weekends and major holidays. You have access to a nurse at all times for urgent questions. Please call the main number to the clinic Dept: [phone] and follow the prompts.  For any non-urgent questions, you may also contact your provider using MyChart. We now offer e-Visits for anyone 90 and older to request care online for non-urgent symptoms. For details visit [email or web address].   Also download the MyChart app! Go to the app store, search MyChart, open the app, select [place], and log in with your MyChart username and password.

## 2024-03-24 ENCOUNTER — Inpatient Hospital Stay

## 2024-03-24 VITALS — BP 130/51 | HR 73 | Temp 97.9°F | Resp 20 | Ht 65.0 in | Wt 207.2 lb

## 2024-03-24 DIAGNOSIS — Z5111 Encounter for antineoplastic chemotherapy: Secondary | ICD-10-CM | POA: Diagnosis not present

## 2024-03-24 DIAGNOSIS — C3430 Malignant neoplasm of lower lobe, unspecified bronchus or lung: Secondary | ICD-10-CM

## 2024-03-24 MED ORDER — PEGFILGRASTIM (INF DEV) 6 MG/0.6ML ~~LOC~~ SOSY
6.0000 mg | PREFILLED_SYRINGE | Freq: Once | SUBCUTANEOUS | Status: AC
  Administered 2024-03-24: 6 mg via SUBCUTANEOUS

## 2024-03-24 MED ORDER — DEXAMETHASONE SOD PHOSPHATE PF 10 MG/ML IJ SOLN
10.0000 mg | Freq: Once | INTRAMUSCULAR | Status: AC
  Administered 2024-03-24: 10 mg via INTRAVENOUS
  Filled 2024-03-24: qty 1

## 2024-03-24 MED ORDER — SODIUM CHLORIDE 0.9 % IV SOLN
100.0000 mg/m2 | Freq: Once | INTRAVENOUS | Status: AC
  Administered 2024-03-24: 200 mg via INTRAVENOUS
  Filled 2024-03-24: qty 10

## 2024-03-24 MED ORDER — SODIUM CHLORIDE 0.9 % IV SOLN: INTRAVENOUS | Status: DC

## 2024-03-24 NOTE — Patient Instructions (Signed)
 [facility] [place] - A DEPT OF [place]  Discharge Instructions: Thank you for choosing [facility] to provide your oncology and hematology care.  If you have a lab appointment with the Cancer Center, please go directly to the Cancer Center and check in at the registration area.   Wear comfortable clothing and clothing appropriate for easy access to any Portacath or PICC line.   We strive to give you quality time with your provider. You may need to reschedule your appointment if you arrive late (15 or more minutes).  Arriving late affects you and other patients whose appointments are after yours.  Also, if you miss three or more appointments without notifying the office, you may be dismissed from the clinic at the providers discretion.      For prescription refill requests, have your pharmacy contact our office and allow 72 hours for refills to be completed.    Today you received the following chemotherapy and/or immunotherapy agents Etoposide        To help prevent nausea and vomiting after your treatment, we encourage you to take your nausea medication as directed.  BELOW ARE SYMPTOMS THAT SHOULD BE REPORTED IMMEDIATELY: *FEVER GREATER THAN 100.4 F (38 C) OR HIGHER *CHILLS OR SWEATING *NAUSEA AND VOMITING THAT IS NOT CONTROLLED WITH YOUR NAUSEA MEDICATION *UNUSUAL SHORTNESS OF BREATH *UNUSUAL BRUISING OR BLEEDING *URINARY PROBLEMS (pain or burning when urinating, or frequent urination) *BOWEL PROBLEMS (unusual diarrhea, constipation, pain near the anus) TENDERNESS IN MOUTH AND THROAT WITH OR WITHOUT PRESENCE OF ULCERS (sore throat, sores in mouth, or a toothache) UNUSUAL RASH, SWELLING OR PAIN  UNUSUAL VAGINAL DISCHARGE OR ITCHING   Items with * indicate a potential emergency and should be followed up as soon as possible or go to the Emergency Department if any problems should occur.  Please show the CHEMOTHERAPY ALERT CARD or IMMUNOTHERAPY  ALERT CARD at check-in to the Emergency Department and triage nurse.  Should you have questions after your visit or need to cancel or reschedule your appointment, please contact [facility] [place] [facility]  Dept: [phone]  and follow the prompts.  Office hours are 8:00 a.m. to 4:30 p.m. Monday - Friday. Please note that voicemails left after 4:00 p.m. may not be returned until the following business day.  We are closed weekends and major holidays. You have access to a nurse at all times for urgent questions. Please call the main number to the clinic Dept: [phone] and follow the prompts.  For any non-urgent questions, you may also contact your provider using MyChart. We now offer e-Visits for anyone 90 and older to request care online for non-urgent symptoms. For details visit [email or web address].   Also download the MyChart app! Go to the app store, search MyChart, open the app, select [place], and log in with your MyChart username and password.

## 2024-03-27 ENCOUNTER — Inpatient Hospital Stay

## 2024-03-27 VITALS — BP 107/63 | HR 88 | Temp 97.7°F | Resp 20

## 2024-03-27 DIAGNOSIS — C3431 Malignant neoplasm of lower lobe, right bronchus or lung: Secondary | ICD-10-CM

## 2024-03-27 DIAGNOSIS — Z5111 Encounter for antineoplastic chemotherapy: Secondary | ICD-10-CM | POA: Diagnosis not present

## 2024-03-27 LAB — CMP (CANCER CENTER ONLY)
ALT: 23 U/L (ref 0–44)
AST: 23 U/L (ref 15–41)
Albumin: 3.7 g/dL (ref 3.5–5.0)
Alkaline Phosphatase: 138 U/L — ABNORMAL HIGH (ref 38–126)
Anion gap: 11 (ref 5–15)
BUN: 23 mg/dL (ref 8–23)
CO2: 29 mmol/L (ref 22–32)
Calcium: 9.5 mg/dL (ref 8.9–10.3)
Chloride: 96 mmol/L — ABNORMAL LOW (ref 98–111)
Creatinine: 0.91 mg/dL (ref 0.44–1.00)
GFR, Estimated: >60 mL/min
Glucose, Bld: 197 mg/dL — ABNORMAL HIGH (ref 70–99)
Potassium: 3.4 mmol/L — ABNORMAL LOW (ref 3.5–5.1)
Sodium: 136 mmol/L (ref 135–145)
Total Bilirubin: 0.6 mg/dL (ref 0.0–1.2)
Total Protein: 6.3 g/dL — ABNORMAL LOW (ref 6.5–8.1)

## 2024-03-27 LAB — CBC WITH DIFFERENTIAL (CANCER CENTER ONLY)
Band Neutrophils: 7 %
Basophils Absolute: 0 K/uL (ref 0.0–0.1)
Basophils Relative: 0 %
Eosinophils Absolute: 0 K/uL (ref 0.0–0.5)
Eosinophils Relative: 0 %
HCT: 27 % — ABNORMAL LOW (ref 36.0–46.0)
Hemoglobin: 8.6 g/dL — ABNORMAL LOW (ref 12.0–15.0)
Lymphocytes Relative: 3 %
Lymphs Abs: 1.5 K/uL (ref 0.7–4.0)
MCH: 30.6 pg (ref 26.0–34.0)
MCHC: 31.9 g/dL (ref 30.0–36.0)
MCV: 96.1 fL (ref 80.0–100.0)
Monocytes Absolute: 0.5 K/uL (ref 0.1–1.0)
Monocytes Relative: 1 %
Neutro Abs: 46.8 K/uL — ABNORMAL HIGH (ref 1.7–7.7)
Neutrophils Relative %: 89 %
Platelet Count: 210 K/uL (ref 150–400)
RBC: 2.81 MIL/uL — ABNORMAL LOW (ref 3.87–5.11)
RDW: 21.4 % — ABNORMAL HIGH (ref 11.5–15.5)
Smear Review: NORMAL
WBC Count: 48.8 K/uL — ABNORMAL HIGH (ref 4.0–10.5)
nRBC: 0 % (ref 0.0–0.2)

## 2024-03-27 LAB — MAGNESIUM: Magnesium: 1.8 mg/dL (ref 1.7–2.4)

## 2024-03-27 MED ORDER — SODIUM CHLORIDE 0.9 % IV SOLN: INTRAVENOUS | Status: DC

## 2024-03-27 NOTE — Patient Instructions (Signed)

## 2024-03-27 NOTE — Progress Notes (Signed)
 Kelli mosher, pa notitifed that pt said she just doesn't feel good. states her chest is a little heavy-does not feel like its cardiac or resp. she's been nauseated and had diarrhea yesterday. her initial bp was 103/54, hr 100 sitting, 78/51, hr 102 standing. she's just here for lab work today. no dr visit or tx planned today. 1028-kelli mosher, pa in to see pt.  1135-pt instructed to take 1/2 of her demadex  pill per [employer], pa.  Pt verbalizes understanding.

## 2024-03-28 ENCOUNTER — Encounter: Payer: Self-pay | Admitting: Hematology and Oncology

## 2024-04-01 ENCOUNTER — Other Ambulatory Visit: Payer: Self-pay

## 2024-04-03 ENCOUNTER — Inpatient Hospital Stay

## 2024-04-05 ENCOUNTER — Inpatient Hospital Stay

## 2024-04-05 DIAGNOSIS — C3431 Malignant neoplasm of lower lobe, right bronchus or lung: Secondary | ICD-10-CM

## 2024-04-05 DIAGNOSIS — Z5111 Encounter for antineoplastic chemotherapy: Secondary | ICD-10-CM | POA: Diagnosis not present

## 2024-04-05 LAB — CMP (CANCER CENTER ONLY)
ALT: 14 U/L (ref 0–44)
AST: 21 U/L (ref 15–41)
Albumin: 3.7 g/dL (ref 3.5–5.0)
Alkaline Phosphatase: 130 U/L — ABNORMAL HIGH (ref 38–126)
Anion gap: 12 (ref 5–15)
BUN: 20 mg/dL (ref 8–23)
CO2: 27 mmol/L (ref 22–32)
Calcium: 9.3 mg/dL (ref 8.9–10.3)
Chloride: 97 mmol/L — ABNORMAL LOW (ref 98–111)
Creatinine: 1.06 mg/dL — ABNORMAL HIGH (ref 0.44–1.00)
GFR, Estimated: 54 mL/min — ABNORMAL LOW
Glucose, Bld: 120 mg/dL — ABNORMAL HIGH (ref 70–99)
Potassium: 4.1 mmol/L (ref 3.5–5.1)
Sodium: 135 mmol/L (ref 135–145)
Total Bilirubin: 0.2 mg/dL (ref 0.0–1.2)
Total Protein: 6.6 g/dL (ref 6.5–8.1)

## 2024-04-05 LAB — CBC WITH DIFFERENTIAL (CANCER CENTER ONLY)
Abs Immature Granulocytes: 0.49 10*3/uL — ABNORMAL HIGH (ref 0.00–0.07)
Basophils Absolute: 0 10*3/uL (ref 0.0–0.1)
Basophils Relative: 0 %
Eosinophils Absolute: 0 10*3/uL (ref 0.0–0.5)
Eosinophils Relative: 0 %
HCT: 23.9 % — ABNORMAL LOW (ref 36.0–46.0)
Hemoglobin: 7.6 g/dL — ABNORMAL LOW (ref 12.0–15.0)
Immature Granulocytes: 5 %
Lymphocytes Relative: 22 %
Lymphs Abs: 2.1 10*3/uL (ref 0.7–4.0)
MCH: 30.8 pg (ref 26.0–34.0)
MCHC: 31.8 g/dL (ref 30.0–36.0)
MCV: 96.8 fL (ref 80.0–100.0)
Monocytes Absolute: 0.8 10*3/uL (ref 0.1–1.0)
Monocytes Relative: 8 %
Neutro Abs: 6.1 10*3/uL (ref 1.7–7.7)
Neutrophils Relative %: 65 %
Platelet Count: 70 10*3/uL — ABNORMAL LOW (ref 150–400)
RBC: 2.47 MIL/uL — ABNORMAL LOW (ref 3.87–5.11)
RDW: 22.1 % — ABNORMAL HIGH (ref 11.5–15.5)
WBC Count: 9.5 10*3/uL (ref 4.0–10.5)
nRBC: 0.3 % — ABNORMAL HIGH (ref 0.0–0.2)

## 2024-04-05 LAB — MAGNESIUM: Magnesium: 1.6 mg/dL — ABNORMAL LOW (ref 1.7–2.4)

## 2024-04-10 ENCOUNTER — Inpatient Hospital Stay: Admitting: Hematology and Oncology

## 2024-04-10 ENCOUNTER — Inpatient Hospital Stay

## 2024-04-11 NOTE — Assessment & Plan Note (Addendum)
 Stage IV small cell lung cancer diagnosed in October 2025.  She was placed on chemoimmunotherapy with carboplatin /etoposide /atezolizumab  in November.  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  Due to this, she was unable to receive Neulasta  with her first cycle.  She was hospitalized after her first cycle with febrile neutropenia.  She has had anemia requiring transfusion, as well as hypomagnesemia and hypokalemia requiring IV replacement.  She had CTA chest in December in the [facility] ED due to chest pain prior to her third cycle of carboplatin /etoposide /atezolizumab .  This did not reveal which did not reveal any mediastinal adenopathy or evidence of pulmonary embolism.  As her disease had responded well, her chest pain was felt to be pleuritic in nature.She completed 4 cycles of carboplatin /etoposide /atezolizumab  in January.  We plan for maintenance atezolizumab  every 3 weeks.  She will proceed with her 5th cycle of treatment today, now consisting of atezolizumab  every 3 weeks.  She is having some right upper chest, scapula and humerus pain.  This seems musculoskeletal in nature.  If it persists we will investigate further.  I will plan to see her back in 1 week with a CBC, comprehensive metabolic panel and magnesium  for continued supportive care

## 2024-04-12 ENCOUNTER — Inpatient Hospital Stay: Attending: Oncology | Admitting: Hematology and Oncology

## 2024-04-12 ENCOUNTER — Inpatient Hospital Stay

## 2024-04-12 ENCOUNTER — Encounter: Payer: Self-pay | Admitting: Hematology and Oncology

## 2024-04-12 VITALS — BP 98/61 | HR 85 | Temp 98.9°F | Resp 18 | Ht 65.0 in | Wt 199.3 lb

## 2024-04-12 DIAGNOSIS — C3431 Malignant neoplasm of lower lobe, right bronchus or lung: Secondary | ICD-10-CM

## 2024-04-12 DIAGNOSIS — D630 Anemia in neoplastic disease: Secondary | ICD-10-CM

## 2024-04-12 DIAGNOSIS — C3430 Malignant neoplasm of lower lobe, unspecified bronchus or lung: Secondary | ICD-10-CM

## 2024-04-12 DIAGNOSIS — M79621 Pain in right upper arm: Secondary | ICD-10-CM | POA: Insufficient documentation

## 2024-04-12 LAB — CBC WITH DIFFERENTIAL (CANCER CENTER ONLY)
Abs Immature Granulocytes: 0.14 10*3/uL — ABNORMAL HIGH (ref 0.00–0.07)
Basophils Absolute: 0 10*3/uL (ref 0.0–0.1)
Basophils Relative: 0 %
Eosinophils Absolute: 0 10*3/uL (ref 0.0–0.5)
Eosinophils Relative: 0 %
HCT: 24.4 % — ABNORMAL LOW (ref 36.0–46.0)
Hemoglobin: 7.9 g/dL — ABNORMAL LOW (ref 12.0–15.0)
Immature Granulocytes: 1 %
Lymphocytes Relative: 9 %
Lymphs Abs: 0.9 10*3/uL (ref 0.7–4.0)
MCH: 32 pg (ref 26.0–34.0)
MCHC: 32.4 g/dL (ref 30.0–36.0)
MCV: 98.8 fL (ref 80.0–100.0)
Monocytes Absolute: 1.1 10*3/uL — ABNORMAL HIGH (ref 0.1–1.0)
Monocytes Relative: 10 %
Neutro Abs: 8.2 10*3/uL — ABNORMAL HIGH (ref 1.7–7.7)
Neutrophils Relative %: 80 %
Platelet Count: 194 10*3/uL (ref 150–400)
RBC: 2.47 MIL/uL — ABNORMAL LOW (ref 3.87–5.11)
RDW: 23.2 % — ABNORMAL HIGH (ref 11.5–15.5)
WBC Count: 10.4 10*3/uL (ref 4.0–10.5)
nRBC: 0 % (ref 0.0–0.2)

## 2024-04-12 LAB — CMP (CANCER CENTER ONLY)
ALT: 8 U/L (ref 0–44)
AST: 16 U/L (ref 15–41)
Albumin: 3.8 g/dL (ref 3.5–5.0)
Alkaline Phosphatase: 114 U/L (ref 38–126)
Anion gap: 12 (ref 5–15)
BUN: 15 mg/dL (ref 8–23)
CO2: 26 mmol/L (ref 22–32)
Calcium: 9.3 mg/dL (ref 8.9–10.3)
Chloride: 99 mmol/L (ref 98–111)
Creatinine: 0.95 mg/dL (ref 0.44–1.00)
GFR, Estimated: >60 mL/min
Glucose, Bld: 187 mg/dL — ABNORMAL HIGH (ref 70–99)
Potassium: 3.7 mmol/L (ref 3.5–5.1)
Sodium: 136 mmol/L (ref 135–145)
Total Bilirubin: 0.3 mg/dL (ref 0.0–1.2)
Total Protein: 6.6 g/dL (ref 6.5–8.1)

## 2024-04-12 LAB — MAGNESIUM: Magnesium: 1.5 mg/dL — ABNORMAL LOW (ref 1.7–2.4)

## 2024-04-12 LAB — PREPARE RBC (CROSSMATCH)

## 2024-04-12 MED ORDER — SODIUM CHLORIDE 0.9 % IV SOLN: INTRAVENOUS | Status: DC

## 2024-04-12 MED ORDER — MAGNESIUM SULFATE 4 GM/100ML IV SOLN
4.0000 g | Freq: Once | INTRAVENOUS | Status: AC
  Administered 2024-04-12: 4 g via INTRAVENOUS

## 2024-04-12 MED ORDER — SODIUM CHLORIDE 0.9 % IV SOLN
1200.0000 mg | Freq: Once | INTRAVENOUS | Status: AC
  Administered 2024-04-12: 1200 mg via INTRAVENOUS
  Filled 2024-04-12: qty 20

## 2024-04-12 NOTE — Assessment & Plan Note (Signed)
 Hypomagnesemia likely due to chemotherapy.  I will give her magnesium  sulfate 4 g IV today.  We will continue to monitor this.

## 2024-04-12 NOTE — Patient Instructions (Signed)
 Atezolizumab  Injection What is this medication? ATEZOLIZUMAB  (a te zoe LIZ ue mab) treats some types of cancer. It works by helping your immune system slow or stop the spread of cancer cells. It is a monoclonal antibody. This medicine may be used for other purposes; ask your health care provider or pharmacist if you have questions. COMMON BRAND NAME(S): Tecentriq  What should I tell my care team before I take this medication? They need to know if you have any of these conditions: Allogeneic stem cell transplant (uses someone else's stem cells) Autoimmune diseases, such as Crohn disease, ulcerative colitis, lupus History of chest radiation Nervous system problems, such as Guillain-Barre syndrome, myasthenia gravis Organ transplant An unusual or allergic reaction to atezolizumab , other medications, foods, dyes, or preservatives Pregnant or trying to get pregnant Breast-feeding How should I use this medication? This medication is injected into a vein. It is given by your care team in a hospital or clinic setting. A special MedGuide will be given to you before each treatment. Be sure to read this information carefully each time. Talk to your care team about the use of this medication in children. While it may be prescribed for children as young as 2 years for selected conditions, precautions do apply. Overdosage: If you think you have taken too much of this medicine contact a poison control center or emergency room at once. NOTE: This medicine is only for you. Do not share this medicine with others. What if I miss a dose? Keep appointments for follow-up doses. It is important not to miss your dose. Call your care team if you are unable to keep an appointment. What may interact with this medication? Interactions have not been studied. This list may not describe all possible interactions. Give your health care provider a list of all the medicines, herbs, non-prescription drugs, or dietary  supplements you use. Also tell them if you smoke, drink alcohol, or use illegal drugs. Some items may interact with your medicine. What should I watch for while using this medication? Your condition will be monitored carefully while you are receiving this medication. You may need blood work done while you are taking this medication. This medication may cause serious skin reactions. They can happen weeks to months after starting the medication. Contact your care team right away if you notice fevers or flu-like symptoms with a rash. The rash may be red or purple and then turn into blisters or peeling of the skin. You may also notice a red rash with swelling of the face, lips, or lymph nodes in your neck or under your arms. Talk to your care team if you may be pregnant. Serious birth defects can occur if you take this medication during pregnancy and for 5 months after the last dose. You will need a negative pregnancy test before starting this medication. Contraception is recommended while taking this medication and for 5 months after the last dose. Your care team can help you find the option that works for you. Do not breastfeed while taking this medication and for 5 months after the last dose. This medication may cause infertility. Talk to your care team if you are concerned about your fertility. What side effects may I notice from receiving this medication? Side effects that you should report to your care team as soon as possible: Allergic reactions--skin rash, itching, hives, swelling of the face, lips, tongue, or throat Dry cough, shortness of breath or trouble breathing Eye pain, redness, irritation, or discharge with blurry or  decreased vision Heart muscle inflammation--unusual weakness or fatigue, shortness of breath, chest pain, fast or irregular heartbeat, dizziness, swelling of the ankles, feet, or hands Hormone gland problems--headache, sensitivity to light, unusual weakness or fatigue,  dizziness, fast or irregular heartbeat, increased sensitivity to cold or heat, excessive sweating, constipation, hair loss, increased thirst or amount of urine, tremors or shaking, irritability Infusion reactions--chest pain, shortness of breath or trouble breathing, feeling faint or lightheaded Kidney injury (glomerulonephritis)--decrease in the amount of urine, red or dark brown urine, foamy or bubbly urine, swelling of the ankles, hands, or feet Liver injury--right upper belly pain, loss of appetite, nausea, light-colored stool, dark yellow or brown urine, yellowing skin or eyes, unusual weakness or fatigue Pain, tingling, or numbness in the hands or feet, muscle weakness, change in vision, confusion or trouble speaking, loss of balance or coordination, trouble walking, seizures Rash, fever, and swollen lymph nodes Redness, blistering, peeling, or loosening of the skin, including inside the mouth Sudden or severe stomach pain, bloody diarrhea, fever, nausea, vomiting Side effects that usually do not require medical attention (report these to your care team if they continue or are bothersome): Bone, joint, or muscle pain Diarrhea Fatigue Loss of appetite Nausea Skin rash This list may not describe all possible side effects. Call your doctor for medical advice about side effects. You may report side effects to FDA at 1-800-FDA-1088. Where should I keep my medication? This medication is given in a hospital or clinic. It will not be stored at home. NOTE: This sheet is a summary. It may not cover all possible information. If you have questions about this medicine, talk to your doctor, pharmacist, or health care provider.  2025 Elsevier/Gold Standard (2023-11-05 00:00:00)Magnesium  Sulfate Injection What is this medication? MAGNESIUM  SULFATE (mag NEE zee um SUL fate) prevents and treats low levels of magnesium  in your body. It may also be used to prevent and treat seizures during pregnancy in  people with high blood pressure disorders, such as preeclampsia or eclampsia. Magnesium  plays an important role in maintaining the health of your muscles and nervous system. This medicine may be used for other purposes; ask your health care provider or pharmacist if you have questions. What should I tell my care team before I take this medication? They need to know if you have any of these conditions: Heart disease History of irregular heart beat Kidney disease An unusual or allergic reaction to magnesium  sulfate, medications, foods, dyes, or preservatives Pregnant or trying to get pregnant Breast-feeding How should I use this medication? This medication is for infusion into a vein. It is given in a hospital or clinic setting. Talk to your care team about the use of this medication in children. While this medication may be prescribed for selected conditions, precautions do apply. Overdosage: If you think you have taken too much of this medicine contact a poison control center or emergency room at once. NOTE: This medicine is only for you. Do not share this medicine with others. What if I miss a dose? This does not apply. What may interact with this medication? Certain medications for anxiety or sleep Certain medications for seizures, such phenobarbital Digoxin Medications that relax muscles for surgery Narcotic medications for pain This list may not describe all possible interactions. Give your health care provider a list of all the medicines, herbs, non-prescription drugs, or dietary supplements you use. Also tell them if you smoke, drink alcohol, or use illegal drugs. Some items may interact with your medicine. What  should I watch for while using this medication? Your condition will be monitored carefully while you are receiving this medication. You may need blood work done while you are receiving this medication. What side effects may I notice from receiving this medication? Side  effects that you should report to your care team as soon as possible: Allergic reactions--skin rash, itching, hives, swelling of the face, lips, tongue, or throat High magnesium  level--confusion, drowsiness, facial flushing, redness, sweating, muscle weakness, fast or irregular heartbeat, trouble breathing Low blood pressure--dizziness, feeling faint or lightheaded, blurry vision Side effects that usually do not require medical attention (report to your care team if they continue or are bothersome): Headache Nausea This list may not describe all possible side effects. Call your doctor for medical advice about side effects. You may report side effects to FDA at 1-800-FDA-1088. Where should I keep my medication? This medication is given in a hospital or clinic and will not be stored at home. NOTE: This sheet is a summary. It may not cover all possible information. If you have questions about this medicine, talk to your doctor, pharmacist, or health care provider.  2024 Elsevier/Gold Standard (2020-11-06 00:00:00)

## 2024-04-12 NOTE — Assessment & Plan Note (Signed)
 New pain in her right proximal arm, as well as right scapula and upper anterior chest wall.  She feels it may be coming from her neck and she has been reading a lot.  This appears to be musculoskeletal in nature.  She has been using acetaminophen  as needed with fairly good relief.  I advised her to also use ice alternating with heat, as well as gentle stretches.  If this persists, we will obtain x-rays.

## 2024-04-12 NOTE — Assessment & Plan Note (Signed)
 Anemia felt to be due to her small cell lung cancer and its treatment.  There is no evidence of nutritional deficiency in November.  Her hemoglobin is 7.9 today.  As she is symptomatic and has atrial fibrillation, I will arrange for her to be transfused 1 unit of packed red blood cells as an outpatient tomorrow.

## 2024-04-13 ENCOUNTER — Inpatient Hospital Stay

## 2024-04-13 ENCOUNTER — Other Ambulatory Visit: Payer: Self-pay

## 2024-04-13 DIAGNOSIS — D630 Anemia in neoplastic disease: Secondary | ICD-10-CM

## 2024-04-13 DIAGNOSIS — C3431 Malignant neoplasm of lower lobe, right bronchus or lung: Secondary | ICD-10-CM

## 2024-04-13 MED ORDER — DIPHENHYDRAMINE HCL 25 MG PO TABS: 25.0000 mg | ORAL_TABLET | Freq: Once | ORAL | Status: DC

## 2024-04-13 MED ORDER — ACETAMINOPHEN 325 MG PO TABS: 650.0000 mg | ORAL_TABLET | Freq: Once | ORAL | Status: DC

## 2024-04-13 MED ORDER — SODIUM CHLORIDE 0.9% IV SOLUTION
250.0000 mL | INTRAVENOUS | Status: DC
  Administered 2024-04-13: 250 mL via INTRAVENOUS

## 2024-04-13 NOTE — Patient Instructions (Signed)
 Getting Blood Through an IV (Blood Transfusion) in Adults: What to Know After After a blood transfusion, it is common to have: Bruising and soreness at the IV site. A headache. Follow these instructions at home: Your doctor may give you more instructions. If you have problems, contact your doctor. Insertion site care     Follow instructions from your doctor about how to take care of your insertion site. This is where an IV tube was put into your vein. Make sure you: Wash your hands with soap and water  for at least 20 seconds before and after you change your bandage. If you cannot use soap and water , use hand sanitizer. Change your bandage as told by your doctor. Check your insertion site every day for signs of infection. Check for: Redness, swelling, or pain. Bleeding from the site. Warmth. Pus or a bad smell. General instructions Take over-the-counter and prescription medicines only as told by your doctor. Rest as told by your doctor. Go back to your normal activities as told by your doctor. Keep all follow-up visits. You may need to have tests at certain times to check your blood. Contact a doctor if: You have itching or red, swollen areas of skin (hives). You have a fever or chills. You have pain in the head, back, or chest. You feel worried or nervous (anxious). You feel weak after doing your normal activities. You have any of these problems at the insertion site: Redness, swelling, warmth, or pain. Bleeding that does not stop with pressure. Pus or a bad smell. If you received your blood transfusion in an outpatient setting, you will be told whom to contact to report any reactions. Get help right away if: You have signs of a serious reaction. This may be coming from an allergy or the body's defense system (immune system). Signs include: Trouble breathing or shortness of breath. Swelling of the face or feeling warm (flushed). A widespread rash. Dark pee (urine) or blood in  the pee. Fast heartbeat. These symptoms may be an emergency. Get help right away. Call 911. Do not wait to see if the symptoms will go away. Do not drive yourself to the hospital. Summary Bruising and soreness at the IV site are common. Check your insertion site every day for signs of infection. Rest as told by your doctor. Go back to your normal activities as told by your doctor. Get help right away if you have signs of a serious reaction. This information is not intended to replace advice given to you by your health care provider. Make sure you discuss any questions you have with your health care provider. Document Revised: 12/30/2023 Document Reviewed: 05/23/2021 Elsevier Patient Education  2025 [employer].

## 2024-04-14 LAB — BPAM RBC
Blood Product Expiration Date: 202602082359
ISSUE DATE / TIME: 202602051127
Unit Type and Rh: 202602082359
Unit Type and Rh: 600

## 2024-04-14 LAB — TYPE AND SCREEN
ABO/RH(D): A POS
Antibody Screen: NEGATIVE
Unit division: 0

## 2024-04-17 ENCOUNTER — Inpatient Hospital Stay

## 2024-04-19 ENCOUNTER — Inpatient Hospital Stay: Admitting: Hematology and Oncology

## 2024-04-19 ENCOUNTER — Inpatient Hospital Stay

## 2024-04-19 ENCOUNTER — Ambulatory Visit: Admitting: Neurology

## 2024-04-20 ENCOUNTER — Inpatient Hospital Stay

## 2024-05-03 ENCOUNTER — Inpatient Hospital Stay

## 2024-05-03 ENCOUNTER — Inpatient Hospital Stay: Admitting: Hematology and Oncology

## 2024-05-03 ENCOUNTER — Inpatient Hospital Stay: Admitting: Oncology

## 2024-05-24 ENCOUNTER — Inpatient Hospital Stay

## 2024-05-24 ENCOUNTER — Inpatient Hospital Stay: Admitting: Hematology and Oncology
